# Patient Record
Sex: Female | Born: 1940
Health system: Southern US, Community
[De-identification: ages and names within clinical notes are randomized; demographics above are authoritative.]

## PROBLEM LIST (undated history)

## (undated) DIAGNOSIS — K222 Esophageal obstruction: Secondary | ICD-10-CM

## (undated) DIAGNOSIS — K589 Irritable bowel syndrome without diarrhea: Secondary | ICD-10-CM

## (undated) DIAGNOSIS — K5730 Diverticulosis of large intestine without perforation or abscess without bleeding: Secondary | ICD-10-CM

## (undated) DIAGNOSIS — J45909 Unspecified asthma, uncomplicated: Secondary | ICD-10-CM

## (undated) DIAGNOSIS — E7800 Pure hypercholesterolemia, unspecified: Secondary | ICD-10-CM

## (undated) DIAGNOSIS — G2581 Restless legs syndrome: Secondary | ICD-10-CM

## (undated) DIAGNOSIS — R490 Dysphonia: Secondary | ICD-10-CM

## (undated) DIAGNOSIS — K3184 Gastroparesis: Secondary | ICD-10-CM

## (undated) DIAGNOSIS — N3000 Acute cystitis without hematuria: Secondary | ICD-10-CM

## (undated) DIAGNOSIS — R0789 Other chest pain: Secondary | ICD-10-CM

## (undated) DIAGNOSIS — R4701 Aphasia: Secondary | ICD-10-CM

## (undated) DIAGNOSIS — M1990 Unspecified osteoarthritis, unspecified site: Secondary | ICD-10-CM

## (undated) DIAGNOSIS — K219 Gastro-esophageal reflux disease without esophagitis: Secondary | ICD-10-CM

## (undated) DIAGNOSIS — R413 Other amnesia: Secondary | ICD-10-CM

## (undated) DIAGNOSIS — F419 Anxiety disorder, unspecified: Secondary | ICD-10-CM

## (undated) DIAGNOSIS — R3129 Other microscopic hematuria: Secondary | ICD-10-CM

## (undated) DIAGNOSIS — M797 Fibromyalgia: Secondary | ICD-10-CM

## (undated) DIAGNOSIS — I679 Cerebrovascular disease, unspecified: Secondary | ICD-10-CM

## (undated) DIAGNOSIS — G459 Transient cerebral ischemic attack, unspecified: Secondary | ICD-10-CM

## (undated) HISTORY — DX: Gastroparesis: K31.84

## (undated) HISTORY — DX: Diverticulosis of large intestine without perforation or abscess without bleeding: K57.30

## (undated) HISTORY — DX: Cerebrovascular disease, unspecified: I67.9

## (undated) HISTORY — DX: Other microscopic hematuria: R31.29

## (undated) HISTORY — DX: Fibromyalgia: M79.7

## (undated) HISTORY — DX: Other amnesia: R41.3

## (undated) HISTORY — DX: Unspecified osteoarthritis, unspecified site: M19.90

## (undated) HISTORY — DX: Irritable bowel syndrome, unspecified: K58.9

## (undated) HISTORY — DX: Restless legs syndrome: G25.81

## (undated) HISTORY — DX: Dysphonia: R49.0

## (undated) HISTORY — DX: Other chest pain: R07.89

## (undated) HISTORY — DX: Acute cystitis without hematuria: N30.00

## (undated) HISTORY — DX: Aphasia: R47.01

## (undated) HISTORY — PX: OTHER SURGICAL HISTORY: SHX169

## (undated) HISTORY — DX: Unspecified asthma, uncomplicated: J45.909

## (undated) HISTORY — DX: Anxiety disorder, unspecified: F41.9

## (undated) HISTORY — DX: Gastro-esophageal reflux disease without esophagitis: K21.9

## (undated) HISTORY — DX: Pure hypercholesterolemia, unspecified: E78.00

## (undated) HISTORY — DX: Esophageal obstruction: K22.2

---

## 1993-02-24 HISTORY — PX: ABDOMINAL HYSTERECTOMY: SHX81

## 1997-09-29 ENCOUNTER — Ambulatory Visit (HOSPITAL_COMMUNITY): Admission: RE | Admit: 1997-09-29 | Discharge: 1997-09-29 | Payer: Self-pay | Admitting: Gastroenterology

## 1998-09-12 ENCOUNTER — Other Ambulatory Visit: Admission: RE | Admit: 1998-09-12 | Discharge: 1998-09-12 | Payer: Self-pay | Admitting: Obstetrics and Gynecology

## 1999-01-08 ENCOUNTER — Ambulatory Visit (HOSPITAL_COMMUNITY): Admission: RE | Admit: 1999-01-08 | Discharge: 1999-01-08 | Payer: Self-pay | Admitting: Surgery

## 1999-04-05 ENCOUNTER — Other Ambulatory Visit: Admission: RE | Admit: 1999-04-05 | Discharge: 1999-04-05 | Payer: Self-pay | Admitting: Gastroenterology

## 1999-04-05 ENCOUNTER — Encounter (INDEPENDENT_AMBULATORY_CARE_PROVIDER_SITE_OTHER): Payer: Self-pay | Admitting: Specialist

## 1999-04-11 ENCOUNTER — Ambulatory Visit (HOSPITAL_COMMUNITY): Admission: RE | Admit: 1999-04-11 | Discharge: 1999-04-11 | Payer: Self-pay | Admitting: Gastroenterology

## 1999-04-11 ENCOUNTER — Encounter: Payer: Self-pay | Admitting: Gastroenterology

## 1999-04-19 ENCOUNTER — Ambulatory Visit (HOSPITAL_COMMUNITY): Admission: RE | Admit: 1999-04-19 | Discharge: 1999-04-19 | Payer: Self-pay | Admitting: Gastroenterology

## 1999-04-19 ENCOUNTER — Encounter: Payer: Self-pay | Admitting: Gastroenterology

## 2000-07-09 ENCOUNTER — Other Ambulatory Visit: Admission: RE | Admit: 2000-07-09 | Discharge: 2000-07-09 | Payer: Self-pay | Admitting: Obstetrics and Gynecology

## 2000-09-03 ENCOUNTER — Other Ambulatory Visit: Admission: RE | Admit: 2000-09-03 | Discharge: 2000-09-03 | Payer: Self-pay | Admitting: Urology

## 2000-09-22 ENCOUNTER — Encounter: Admission: RE | Admit: 2000-09-22 | Discharge: 2000-09-22 | Payer: Self-pay | Admitting: Urology

## 2000-09-22 ENCOUNTER — Encounter: Payer: Self-pay | Admitting: Urology

## 2001-08-20 ENCOUNTER — Other Ambulatory Visit: Admission: RE | Admit: 2001-08-20 | Discharge: 2001-08-20 | Payer: Self-pay | Admitting: Obstetrics and Gynecology

## 2002-08-22 ENCOUNTER — Other Ambulatory Visit: Admission: RE | Admit: 2002-08-22 | Discharge: 2002-08-22 | Payer: Self-pay | Admitting: Obstetrics and Gynecology

## 2004-03-05 ENCOUNTER — Ambulatory Visit: Payer: Self-pay | Admitting: Pulmonary Disease

## 2004-07-21 ENCOUNTER — Emergency Department (HOSPITAL_COMMUNITY): Admission: AD | Admit: 2004-07-21 | Discharge: 2004-07-21 | Payer: Self-pay | Admitting: Family Medicine

## 2004-07-23 ENCOUNTER — Ambulatory Visit: Payer: Self-pay | Admitting: Pulmonary Disease

## 2004-07-25 HISTORY — PX: OTHER SURGICAL HISTORY: SHX169

## 2004-07-27 ENCOUNTER — Ambulatory Visit (HOSPITAL_COMMUNITY): Admission: RE | Admit: 2004-07-27 | Discharge: 2004-07-27 | Payer: Self-pay | Admitting: Pulmonary Disease

## 2004-08-23 ENCOUNTER — Ambulatory Visit (HOSPITAL_COMMUNITY): Admission: AD | Admit: 2004-08-23 | Discharge: 2004-08-25 | Payer: Self-pay | Admitting: Orthopedic Surgery

## 2004-11-08 ENCOUNTER — Ambulatory Visit: Payer: Self-pay | Admitting: Gastroenterology

## 2004-11-21 ENCOUNTER — Ambulatory Visit: Payer: Self-pay | Admitting: Gastroenterology

## 2005-02-05 ENCOUNTER — Ambulatory Visit: Payer: Self-pay | Admitting: Pulmonary Disease

## 2005-09-02 ENCOUNTER — Ambulatory Visit: Payer: Self-pay | Admitting: Gastroenterology

## 2005-09-03 ENCOUNTER — Ambulatory Visit: Payer: Self-pay | Admitting: Gastroenterology

## 2005-09-03 ENCOUNTER — Encounter: Payer: Self-pay | Admitting: Gastroenterology

## 2005-11-24 ENCOUNTER — Ambulatory Visit: Payer: Self-pay | Admitting: Pulmonary Disease

## 2006-03-02 ENCOUNTER — Ambulatory Visit: Payer: Self-pay | Admitting: Gastroenterology

## 2006-06-24 ENCOUNTER — Ambulatory Visit (HOSPITAL_BASED_OUTPATIENT_CLINIC_OR_DEPARTMENT_OTHER): Admission: RE | Admit: 2006-06-24 | Discharge: 2006-06-24 | Payer: Self-pay | Admitting: Rheumatology

## 2006-06-28 ENCOUNTER — Ambulatory Visit: Payer: Self-pay | Admitting: Internal Medicine

## 2006-12-07 ENCOUNTER — Ambulatory Visit: Payer: Self-pay | Admitting: Gastroenterology

## 2006-12-11 ENCOUNTER — Ambulatory Visit (HOSPITAL_COMMUNITY): Admission: RE | Admit: 2006-12-11 | Discharge: 2006-12-11 | Payer: Self-pay | Admitting: Gastroenterology

## 2007-01-05 ENCOUNTER — Ambulatory Visit: Payer: Self-pay | Admitting: Gastroenterology

## 2007-01-08 ENCOUNTER — Ambulatory Visit: Payer: Self-pay | Admitting: Pulmonary Disease

## 2007-01-08 LAB — CONVERTED CEMR LAB
ALT: 15 units/L (ref 0–35)
AST: 23 units/L (ref 0–37)
Albumin: 4 g/dL (ref 3.5–5.2)
Alkaline Phosphatase: 79 units/L (ref 39–117)
BUN: 11 mg/dL (ref 6–23)
Basophils Relative: 0.9 % (ref 0.0–1.0)
Bilirubin, Direct: 0.2 mg/dL (ref 0.0–0.3)
CO2: 31 meq/L (ref 19–32)
Calcium: 9.4 mg/dL (ref 8.4–10.5)
Chloride: 104 meq/L (ref 96–112)
Cholesterol: 212 mg/dL (ref 0–200)
Creatinine, Ser: 0.9 mg/dL (ref 0.4–1.2)
Direct LDL: 131.6 mg/dL
Eosinophils Relative: 4.6 % (ref 0.0–5.0)
GFR calc Af Amer: 80 mL/min
GFR calc non Af Amer: 66 mL/min
Glucose, Bld: 96 mg/dL (ref 70–99)
HCT: 39.7 % (ref 36.0–46.0)
HDL: 67.5 mg/dL (ref >39.0)
Hemoglobin: 13.9 g/dL (ref 12.0–15.0)
Lymphocytes Relative: 35.5 % (ref 12.0–46.0)
MCHC: 35.1 g/dL (ref 30.0–36.0)
MCV: 90.2 fL (ref 78.0–100.0)
Monocytes Relative: 9.8 % (ref 3.0–11.0)
Neutrophils Relative %: 49.2 % (ref 43.0–77.0)
Platelets: 181 10*3/uL (ref 150–400)
Potassium: 4.4 meq/L (ref 3.5–5.1)
RBC: 4.4 M/uL (ref 3.87–5.11)
RDW: 12.2 % (ref 11.5–14.6)
Sed Rate: 12 mm/hr (ref 0–25)
Sodium: 139 meq/L (ref 135–145)
TSH: 1.36 microintl units/mL (ref 0.35–5.50)
Total Bilirubin: 0.7 mg/dL (ref 0.3–1.2)
Total CHOL/HDL Ratio: 3.1
Total Protein: 7 g/dL (ref 6.0–8.3)
Triglycerides: 57 mg/dL (ref 0–149)
VLDL: 11 mg/dL (ref 0–40)
WBC: 3.8 10*3/uL — ABNORMAL LOW (ref 4.5–10.5)

## 2007-01-13 ENCOUNTER — Ambulatory Visit: Payer: Self-pay

## 2007-01-22 ENCOUNTER — Telehealth: Payer: Self-pay | Admitting: Pulmonary Disease

## 2007-01-27 ENCOUNTER — Telehealth (INDEPENDENT_AMBULATORY_CARE_PROVIDER_SITE_OTHER): Payer: Self-pay | Admitting: *Deleted

## 2007-01-29 ENCOUNTER — Telehealth: Payer: Self-pay | Admitting: Pulmonary Disease

## 2007-02-02 ENCOUNTER — Telehealth: Payer: Self-pay | Admitting: Pulmonary Disease

## 2007-02-04 ENCOUNTER — Emergency Department (HOSPITAL_COMMUNITY): Admission: EM | Admit: 2007-02-04 | Discharge: 2007-02-05 | Payer: Self-pay | Admitting: Emergency Medicine

## 2007-03-31 ENCOUNTER — Encounter: Admission: RE | Admit: 2007-03-31 | Discharge: 2007-03-31 | Payer: Self-pay | Admitting: Neurology

## 2007-05-18 ENCOUNTER — Encounter: Payer: Self-pay | Admitting: Pulmonary Disease

## 2007-07-16 ENCOUNTER — Telehealth: Payer: Self-pay | Admitting: Gastroenterology

## 2007-07-29 ENCOUNTER — Telehealth: Payer: Self-pay | Admitting: Gastroenterology

## 2007-07-30 DIAGNOSIS — F411 Generalized anxiety disorder: Secondary | ICD-10-CM | POA: Insufficient documentation

## 2007-07-30 DIAGNOSIS — K649 Unspecified hemorrhoids: Secondary | ICD-10-CM | POA: Insufficient documentation

## 2007-07-30 DIAGNOSIS — K222 Esophageal obstruction: Secondary | ICD-10-CM | POA: Insufficient documentation

## 2007-08-02 ENCOUNTER — Ambulatory Visit: Payer: Self-pay | Admitting: Gastroenterology

## 2007-08-02 DIAGNOSIS — K3184 Gastroparesis: Secondary | ICD-10-CM | POA: Insufficient documentation

## 2007-08-02 DIAGNOSIS — K219 Gastro-esophageal reflux disease without esophagitis: Secondary | ICD-10-CM | POA: Insufficient documentation

## 2007-10-25 ENCOUNTER — Telehealth: Payer: Self-pay | Admitting: Gastroenterology

## 2008-03-16 ENCOUNTER — Telehealth: Payer: Self-pay | Admitting: Pulmonary Disease

## 2008-03-22 ENCOUNTER — Ambulatory Visit: Payer: Self-pay | Admitting: Pulmonary Disease

## 2008-03-22 DIAGNOSIS — N3000 Acute cystitis without hematuria: Secondary | ICD-10-CM | POA: Insufficient documentation

## 2008-03-27 ENCOUNTER — Telehealth (INDEPENDENT_AMBULATORY_CARE_PROVIDER_SITE_OTHER): Payer: Self-pay | Admitting: *Deleted

## 2008-03-28 ENCOUNTER — Ambulatory Visit: Payer: Self-pay | Admitting: Pulmonary Disease

## 2008-03-28 DIAGNOSIS — IMO0001 Reserved for inherently not codable concepts without codable children: Secondary | ICD-10-CM | POA: Insufficient documentation

## 2008-03-28 DIAGNOSIS — E7800 Pure hypercholesterolemia, unspecified: Secondary | ICD-10-CM | POA: Insufficient documentation

## 2008-03-28 DIAGNOSIS — M797 Fibromyalgia: Secondary | ICD-10-CM | POA: Insufficient documentation

## 2008-03-28 DIAGNOSIS — R32 Unspecified urinary incontinence: Secondary | ICD-10-CM | POA: Insufficient documentation

## 2008-03-28 DIAGNOSIS — E559 Vitamin D deficiency, unspecified: Secondary | ICD-10-CM | POA: Insufficient documentation

## 2008-03-30 ENCOUNTER — Telehealth (INDEPENDENT_AMBULATORY_CARE_PROVIDER_SITE_OTHER): Payer: Self-pay | Admitting: *Deleted

## 2008-04-02 DIAGNOSIS — K589 Irritable bowel syndrome without diarrhea: Secondary | ICD-10-CM | POA: Insufficient documentation

## 2008-04-02 DIAGNOSIS — M1990 Unspecified osteoarthritis, unspecified site: Secondary | ICD-10-CM | POA: Insufficient documentation

## 2008-04-02 DIAGNOSIS — J209 Acute bronchitis, unspecified: Secondary | ICD-10-CM | POA: Insufficient documentation

## 2008-04-02 DIAGNOSIS — Z87448 Personal history of other diseases of urinary system: Secondary | ICD-10-CM | POA: Insufficient documentation

## 2008-04-02 DIAGNOSIS — K5730 Diverticulosis of large intestine without perforation or abscess without bleeding: Secondary | ICD-10-CM | POA: Insufficient documentation

## 2008-04-02 LAB — CONVERTED CEMR LAB
ALT: 25 units/L (ref 0–35)
AST: 27 units/L (ref 0–37)
Albumin: 3.7 g/dL (ref 3.5–5.2)
Alkaline Phosphatase: 90 units/L (ref 39–117)
BUN: 12 mg/dL (ref 6–23)
Basophils Absolute: 0.2 10*3/uL — ABNORMAL HIGH (ref 0.0–0.1)
Basophils Relative: 5.4 % — ABNORMAL HIGH (ref 0.0–3.0)
Bilirubin Urine: NEGATIVE
Bilirubin, Direct: 0.1 mg/dL (ref 0.0–0.3)
CO2: 34 meq/L — ABNORMAL HIGH (ref 19–32)
Calcium: 9.2 mg/dL (ref 8.4–10.5)
Chloride: 103 meq/L (ref 96–112)
Cholesterol: 207 mg/dL (ref 0–200)
Creatinine, Ser: 0.7 mg/dL (ref 0.4–1.2)
Crystals: NEGATIVE
Direct LDL: 126.5 mg/dL
Eosinophils Absolute: 0.1 10*3/uL (ref 0.0–0.7)
Eosinophils Relative: 2.7 % (ref 0.0–5.0)
GFR calc Af Amer: 107 mL/min
GFR calc non Af Amer: 88 mL/min
Glucose, Bld: 87 mg/dL (ref 70–99)
HCT: 39.3 % (ref 36.0–46.0)
HDL: 63.9 mg/dL (ref >39.0)
Hemoglobin: 13.5 g/dL (ref 12.0–15.0)
Ketones, ur: NEGATIVE mg/dL
Lymphocytes Relative: 35.3 % (ref 12.0–46.0)
MCHC: 34.4 g/dL (ref 30.0–36.0)
MCV: 90.7 fL (ref 78.0–100.0)
Monocytes Absolute: 0.4 10*3/uL (ref 0.1–1.0)
Monocytes Relative: 8.1 % (ref 3.0–12.0)
Mucus, UA: NEGATIVE
Neutro Abs: 2.1 10*3/uL (ref 1.4–7.7)
Neutrophils Relative %: 48.5 % (ref 43.0–77.0)
Nitrite: NEGATIVE
Platelets: 149 10*3/uL — ABNORMAL LOW (ref 150–400)
Potassium: 4.3 meq/L (ref 3.5–5.1)
RBC: 4.33 M/uL (ref 3.87–5.11)
RDW: 12.2 % (ref 11.5–14.6)
Sed Rate: 15 mm/hr (ref 0–22)
Sodium: 141 meq/L (ref 135–145)
Specific Gravity, Urine: 1.015 (ref 1.000–1.03)
TSH: 1.5 microintl units/mL (ref 0.35–5.50)
Total Bilirubin: 1 mg/dL (ref 0.3–1.2)
Total CHOL/HDL Ratio: 3.2
Total Protein, Urine: NEGATIVE mg/dL
Total Protein: 7.3 g/dL (ref 6.0–8.3)
Triglycerides: 62 mg/dL (ref 0–149)
Urine Glucose: NEGATIVE mg/dL
Urobilinogen, UA: 0.2 (ref 0.0–1.0)
VLDL: 12 mg/dL (ref 0–40)
Vit D, 1,25-Dihydroxy: 15 — ABNORMAL LOW (ref 30–89)
WBC: 4.4 10*3/uL — ABNORMAL LOW (ref 4.5–10.5)
pH: 7.5 (ref 5.0–8.0)

## 2008-04-03 DIAGNOSIS — R498 Other voice and resonance disorders: Secondary | ICD-10-CM | POA: Insufficient documentation

## 2008-04-03 DIAGNOSIS — I679 Cerebrovascular disease, unspecified: Secondary | ICD-10-CM | POA: Insufficient documentation

## 2008-04-03 DIAGNOSIS — R0789 Other chest pain: Secondary | ICD-10-CM | POA: Insufficient documentation

## 2008-04-07 ENCOUNTER — Telehealth (INDEPENDENT_AMBULATORY_CARE_PROVIDER_SITE_OTHER): Payer: Self-pay | Admitting: *Deleted

## 2008-04-18 ENCOUNTER — Telehealth: Payer: Self-pay | Admitting: Gastroenterology

## 2008-04-19 ENCOUNTER — Encounter: Payer: Self-pay | Admitting: Gastroenterology

## 2008-08-15 ENCOUNTER — Other Ambulatory Visit: Admission: RE | Admit: 2008-08-15 | Discharge: 2008-08-15 | Payer: Self-pay | Admitting: Radiology

## 2008-08-15 ENCOUNTER — Encounter: Payer: Self-pay | Admitting: Pulmonary Disease

## 2008-11-07 ENCOUNTER — Encounter: Payer: Self-pay | Admitting: Pulmonary Disease

## 2008-12-28 ENCOUNTER — Encounter: Payer: Self-pay | Admitting: Pulmonary Disease

## 2009-01-05 ENCOUNTER — Encounter: Payer: Self-pay | Admitting: Pulmonary Disease

## 2009-02-02 ENCOUNTER — Encounter: Payer: Self-pay | Admitting: Pulmonary Disease

## 2009-03-23 ENCOUNTER — Telehealth (INDEPENDENT_AMBULATORY_CARE_PROVIDER_SITE_OTHER): Payer: Self-pay | Admitting: *Deleted

## 2009-03-23 ENCOUNTER — Ambulatory Visit: Payer: Self-pay | Admitting: Pulmonary Disease

## 2009-03-23 DIAGNOSIS — L259 Unspecified contact dermatitis, unspecified cause: Secondary | ICD-10-CM | POA: Insufficient documentation

## 2009-03-26 ENCOUNTER — Telehealth: Payer: Self-pay | Admitting: Adult Health

## 2009-04-27 ENCOUNTER — Encounter: Payer: Self-pay | Admitting: Pulmonary Disease

## 2009-04-30 ENCOUNTER — Encounter: Payer: Self-pay | Admitting: Pulmonary Disease

## 2009-05-23 ENCOUNTER — Ambulatory Visit: Payer: Self-pay | Admitting: Pulmonary Disease

## 2009-05-27 LAB — CONVERTED CEMR LAB
ALT: 14 units/L (ref 0–35)
AST: 21 units/L (ref 0–37)
Albumin: 3.9 g/dL (ref 3.5–5.2)
Alkaline Phosphatase: 76 units/L (ref 39–117)
BUN: 11 mg/dL (ref 6–23)
Basophils Absolute: 0 10*3/uL (ref 0.0–0.1)
Basophils Relative: 0.3 % (ref 0.0–3.0)
Bilirubin, Direct: 0.1 mg/dL (ref 0.0–0.3)
CO2: 32 meq/L (ref 19–32)
Calcium: 9.3 mg/dL (ref 8.4–10.5)
Chloride: 105 meq/L (ref 96–112)
Cholesterol: 238 mg/dL — ABNORMAL HIGH (ref 0–200)
Creatinine, Ser: 1.1 mg/dL (ref 0.4–1.2)
Direct LDL: 153.7 mg/dL
Eosinophils Absolute: 0 10*3/uL (ref 0.0–0.7)
Eosinophils Relative: 0.8 % (ref 0.0–5.0)
GFR calc non Af Amer: 63.15 mL/min (ref >60)
Glucose, Bld: 88 mg/dL (ref 70–99)
HCT: 41 % (ref 36.0–46.0)
HDL: 70.5 mg/dL (ref >39.00)
Hemoglobin: 13.4 g/dL (ref 12.0–15.0)
Lymphocytes Relative: 28.9 % (ref 12.0–46.0)
Lymphs Abs: 1.5 10*3/uL (ref 0.7–4.0)
MCHC: 32.8 g/dL (ref 30.0–36.0)
MCV: 92.3 fL (ref 78.0–100.0)
Monocytes Absolute: 0.4 10*3/uL (ref 0.1–1.0)
Monocytes Relative: 6.9 % (ref 3.0–12.0)
Neutro Abs: 3.4 10*3/uL (ref 1.4–7.7)
Neutrophils Relative %: 63.1 % (ref 43.0–77.0)
Platelets: 164 10*3/uL (ref 150.0–400.0)
Potassium: 4.4 meq/L (ref 3.5–5.1)
RBC: 4.44 M/uL (ref 3.87–5.11)
RDW: 12.2 % (ref 11.5–14.6)
Sodium: 143 meq/L (ref 135–145)
TSH: 2.05 microintl units/mL (ref 0.35–5.50)
Total Bilirubin: 0.7 mg/dL (ref 0.3–1.2)
Total CHOL/HDL Ratio: 3
Total Protein: 7.2 g/dL (ref 6.0–8.3)
Triglycerides: 61 mg/dL (ref 0.0–149.0)
VLDL: 12.2 mg/dL (ref 0.0–40.0)
Vit D, 25-Hydroxy: 39 ng/mL (ref 30–89)
WBC: 5.3 10*3/uL (ref 4.5–10.5)

## 2009-06-01 ENCOUNTER — Telehealth (INDEPENDENT_AMBULATORY_CARE_PROVIDER_SITE_OTHER): Payer: Self-pay | Admitting: *Deleted

## 2009-06-05 ENCOUNTER — Encounter: Payer: Self-pay | Admitting: Pulmonary Disease

## 2009-07-10 ENCOUNTER — Encounter: Payer: Self-pay | Admitting: Pulmonary Disease

## 2009-07-19 ENCOUNTER — Encounter: Payer: Self-pay | Admitting: Pulmonary Disease

## 2009-08-02 ENCOUNTER — Encounter: Payer: Self-pay | Admitting: Pulmonary Disease

## 2009-08-31 ENCOUNTER — Telehealth (INDEPENDENT_AMBULATORY_CARE_PROVIDER_SITE_OTHER): Payer: Self-pay | Admitting: *Deleted

## 2009-09-04 ENCOUNTER — Telehealth (INDEPENDENT_AMBULATORY_CARE_PROVIDER_SITE_OTHER): Payer: Self-pay | Admitting: *Deleted

## 2009-09-17 ENCOUNTER — Encounter: Payer: Self-pay | Admitting: Pulmonary Disease

## 2009-09-20 ENCOUNTER — Telehealth (INDEPENDENT_AMBULATORY_CARE_PROVIDER_SITE_OTHER): Payer: Self-pay | Admitting: *Deleted

## 2009-09-21 ENCOUNTER — Ambulatory Visit: Payer: Self-pay | Admitting: Adult Health

## 2009-09-21 ENCOUNTER — Ambulatory Visit: Payer: Self-pay | Admitting: Pulmonary Disease

## 2009-09-21 LAB — CONVERTED CEMR LAB
Bilirubin Urine: NEGATIVE
Ketones, ur: NEGATIVE mg/dL
Leukocytes, UA: NEGATIVE
Nitrite: NEGATIVE
Specific Gravity, Urine: 1.01 (ref 1.000–1.030)
Total Protein, Urine: NEGATIVE mg/dL
Urine Glucose: NEGATIVE mg/dL
Urobilinogen, UA: 0.2 (ref 0.0–1.0)
pH: 5.5 (ref 5.0–8.0)

## 2009-09-25 DIAGNOSIS — R39198 Other difficulties with micturition: Secondary | ICD-10-CM | POA: Insufficient documentation

## 2009-09-25 DIAGNOSIS — R3919 Other difficulties with micturition: Secondary | ICD-10-CM | POA: Insufficient documentation

## 2009-09-26 ENCOUNTER — Encounter: Admission: RE | Admit: 2009-09-26 | Discharge: 2009-09-26 | Payer: Self-pay | Admitting: Neurology

## 2009-09-27 ENCOUNTER — Telehealth (INDEPENDENT_AMBULATORY_CARE_PROVIDER_SITE_OTHER): Payer: Self-pay | Admitting: *Deleted

## 2009-09-28 ENCOUNTER — Encounter: Payer: Self-pay | Admitting: Pulmonary Disease

## 2009-10-08 ENCOUNTER — Encounter: Payer: Self-pay | Admitting: Pulmonary Disease

## 2009-10-08 ENCOUNTER — Emergency Department (HOSPITAL_COMMUNITY): Admission: EM | Admit: 2009-10-08 | Discharge: 2009-10-08 | Payer: Self-pay | Admitting: Emergency Medicine

## 2009-10-09 ENCOUNTER — Telehealth (INDEPENDENT_AMBULATORY_CARE_PROVIDER_SITE_OTHER): Payer: Self-pay | Admitting: *Deleted

## 2009-10-10 ENCOUNTER — Telehealth (INDEPENDENT_AMBULATORY_CARE_PROVIDER_SITE_OTHER): Payer: Self-pay | Admitting: *Deleted

## 2009-12-10 ENCOUNTER — Telehealth: Payer: Self-pay | Admitting: Pulmonary Disease

## 2010-01-08 ENCOUNTER — Telehealth (INDEPENDENT_AMBULATORY_CARE_PROVIDER_SITE_OTHER): Payer: Self-pay | Admitting: *Deleted

## 2010-01-14 ENCOUNTER — Ambulatory Visit: Payer: Self-pay | Admitting: Pulmonary Disease

## 2010-01-15 ENCOUNTER — Ambulatory Visit: Payer: Self-pay | Admitting: Pulmonary Disease

## 2010-01-21 LAB — CONVERTED CEMR LAB
Direct LDL: 129.2 mg/dL
HDL: 66.4 mg/dL (ref >39.00)
Total CHOL/HDL Ratio: 3
Triglycerides: 54 mg/dL (ref 0.0–149.0)

## 2010-03-07 ENCOUNTER — Emergency Department (HOSPITAL_COMMUNITY)
Admission: EM | Admit: 2010-03-07 | Discharge: 2010-03-08 | Payer: Self-pay | Source: Home / Self Care | Admitting: Emergency Medicine

## 2010-03-08 ENCOUNTER — Encounter (INDEPENDENT_AMBULATORY_CARE_PROVIDER_SITE_OTHER): Payer: Self-pay | Admitting: Emergency Medicine

## 2010-03-11 ENCOUNTER — Telehealth (INDEPENDENT_AMBULATORY_CARE_PROVIDER_SITE_OTHER): Payer: Self-pay | Admitting: *Deleted

## 2010-03-11 LAB — CK TOTAL AND CKMB (NOT AT ARMC)
CK, MB: 1.3 ng/mL (ref 0.3–4.0)
Relative Index: 1.3 (ref 0.0–2.5)
Total CK: 103 U/L (ref 7–177)

## 2010-03-11 LAB — URINALYSIS, ROUTINE W REFLEX MICROSCOPIC
Bilirubin Urine: NEGATIVE
Ketones, ur: NEGATIVE mg/dL
Leukocytes, UA: NEGATIVE
Nitrite: NEGATIVE
Protein, ur: NEGATIVE mg/dL
Specific Gravity, Urine: 1.006 (ref 1.005–1.030)
Urine Glucose, Fasting: NEGATIVE mg/dL
Urobilinogen, UA: 0.2 mg/dL (ref 0.0–1.0)
pH: 6 (ref 5.0–8.0)

## 2010-03-11 LAB — TROPONIN I: Troponin I: 0.01 ng/mL (ref 0.00–0.06)

## 2010-03-11 LAB — DIFFERENTIAL
Basophils Absolute: 0 10*3/uL (ref 0.0–0.1)
Basophils Relative: 1 % (ref 0–1)
Eosinophils Absolute: 0.2 10*3/uL (ref 0.0–0.7)
Eosinophils Relative: 4 % (ref 0–5)
Lymphocytes Relative: 47 % — ABNORMAL HIGH (ref 12–46)
Lymphs Abs: 2.3 10*3/uL (ref 0.7–4.0)
Monocytes Absolute: 0.4 10*3/uL (ref 0.1–1.0)
Monocytes Relative: 7 % (ref 3–12)
Neutro Abs: 2 10*3/uL (ref 1.7–7.7)
Neutrophils Relative %: 42 % — ABNORMAL LOW (ref 43–77)

## 2010-03-11 LAB — BASIC METABOLIC PANEL
BUN: 9 mg/dL (ref 6–23)
CO2: 29 mEq/L (ref 19–32)
Calcium: 9 mg/dL (ref 8.4–10.5)
Chloride: 105 mEq/L (ref 96–112)
Creatinine, Ser: 0.94 mg/dL (ref 0.4–1.2)
GFR calc Af Amer: >60 mL/min (ref >60)
GFR calc non Af Amer: 59 mL/min — ABNORMAL LOW (ref >60)
Glucose, Bld: 96 mg/dL (ref 70–99)
Potassium: 3.8 mEq/L (ref 3.5–5.1)
Sodium: 140 mEq/L (ref 135–145)

## 2010-03-11 LAB — CBC
HCT: 38.2 % (ref 36.0–46.0)
Hemoglobin: 12.2 g/dL (ref 12.0–15.0)
MCH: 28.7 pg (ref 26.0–34.0)
MCHC: 31.9 g/dL (ref 30.0–36.0)
MCV: 89.9 fL (ref 78.0–100.0)
Platelets: 168 10*3/uL (ref 150–400)
RBC: 4.25 MIL/uL (ref 3.87–5.11)
RDW: 13 % (ref 11.5–15.5)
WBC: 4.8 10*3/uL (ref 4.0–10.5)

## 2010-03-11 LAB — LIPID PANEL
Cholesterol: 182 mg/dL (ref 0–200)
HDL: 55 mg/dL (ref >39)
LDL Cholesterol: 106 mg/dL — ABNORMAL HIGH (ref 0–99)
Total CHOL/HDL Ratio: 3.3 RATIO
Triglycerides: 107 mg/dL (ref <150)
VLDL: 21 mg/dL (ref 0–40)

## 2010-03-11 LAB — POCT CARDIAC MARKERS
CKMB, poc: <1 ng/mL — ABNORMAL LOW (ref 1.0–8.0)
Myoglobin, poc: 51.7 ng/mL (ref 12–200)
Troponin i, poc: <0.05 ng/mL (ref 0.00–0.09)

## 2010-03-11 LAB — HEMOGLOBIN A1C
Hgb A1c MFr Bld: 5.7 % — ABNORMAL HIGH (ref <5.7)
Mean Plasma Glucose: 117 mg/dL — ABNORMAL HIGH (ref <117)

## 2010-03-11 LAB — APTT: aPTT: 27 seconds (ref 24–37)

## 2010-03-11 LAB — URINE MICROSCOPIC-ADD ON

## 2010-03-11 LAB — PROTIME-INR
INR: 1.02 (ref 0.00–1.49)
Prothrombin Time: 13.6 seconds (ref 11.6–15.2)

## 2010-03-15 ENCOUNTER — Encounter: Payer: Self-pay | Admitting: Pulmonary Disease

## 2010-03-19 ENCOUNTER — Encounter: Payer: Self-pay | Admitting: Pulmonary Disease

## 2010-03-28 NOTE — Progress Notes (Signed)
Summary: rash  Phone Note Call from Patient   Caller: Patient Call For: nadel Summary of Call: pt had  a really bad rash last night so went to the er and they gave her a medicinie for this put her on ratidine and hydroxzine still has rash and red bumps red from wrist to bend of the arm really needs help  Initial call taken by: Lacinda Axon,  October 09, 2009 5:23 PM  Follow-up for Phone Call        called and spoke with pt.  pt states she went to urgent care last night d/t rash and red bumps on arm.  pt states this comes and goes.  offered pt an appt.  pt scheduled to see TP tomorrow at 11:30am.  Arman Filter LPN  October 09, 2009 5:28 PM

## 2010-03-28 NOTE — Progress Notes (Signed)
Summary: omeprazole PA  Phone Note Outgoing Call   Call placed by: Vernie Murders,  June 01, 2009 5:22 PM Call placed to: Insurer Summary of Call: PA for omeprazole 40 mg was initiated through [employer].  Form placed on SN's cart to be completed. Initial call taken by: Vernie Murders,  June 01, 2009 5:23 PM  Follow-up for Phone Call        pt says that her generic Zegrid is Omeprazole Sodium Bicarbonate - Said she picked up wrong stuff yesterday and wants you to ask pharm if they will give her a credit.  This is what Dr. Lovie Macadamia had her on. Follow-up by: Eugene Gavia,  June 05, 2009 10:52 AM  Additional Follow-up for Phone Call Additional follow up Details #1::        the omeprazole has been approved by the pharmacy--see the prior auth that has been scanned in Randell Loop [facility]  June 07, 2009 4:14 PM

## 2010-03-28 NOTE — Progress Notes (Signed)
Summary: SET UP LABS  Phone Note Call from Patient Call back at Home Phone [phone]   Caller: Patient Call For: NADEL Summary of Call: PT HAS APPT NEXT WK. WANTS LABS SET UP SO SHE CAN COME IN THIS WK. CALL HOME # FIRST OR CELL [phone] Initial call taken by: Tivis Ringer, CNA,  January 08, 2010 12:31 PM  Follow-up for Phone Call        Please advise what lab orders to put in computer. Abigail Miyamoto RN  January 08, 2010 1:05 PM    per SN---only needs lip-272.0.  thanks Randell Loop CMA  January 08, 2010 2:24 PM   Additional Follow-up for Phone Call Additional follow up Details #1::        Spoke with pt and made aware order was placed and needs to come in fasting. I added vitamin d level per her request- she is due to have this rechecked. Additional Follow-up by: Vernie Murders,  January 08, 2010 2:45 PM

## 2010-03-28 NOTE — Progress Notes (Signed)
Summary: referral- info given to pt  Phone Note Call from Patient Call back at [facility] Phone [phone]   Caller: Patient Call For: nadel Summary of Call: pt called back re: dr Jodelle Green recs (see emr msg 7/8). i gave her the info (also gave her a ph# for dr Anne Hahn). nothing further needed per pt.  Initial call taken by: Tivis Ringer, CNA,  September 04, 2009 4:08 PM

## 2010-03-28 NOTE — Progress Notes (Signed)
Summary: referral  Phone Note Call from Patient Call back at Home Phone [phone]   Caller: Patient Call For: Paige Pena Reason for Call: Talk to Nurse Summary of Call: pt says she still sees something in her urine whe she urinates.  Can she be referred to Urology? Initial call taken by: Eugene Gavia,  September 27, 2009 3:19 PM  Follow-up for Phone Call        Per Tammy Parrett pt instructed that she could call her urologist and schedule  an appt if pt feels this is necessary and we can fax results of labs if they need them. Abigail Miyamoto RN  September 27, 2009 3:28 PM

## 2010-03-28 NOTE — Letter (Signed)
Summary: [facility]  [facility]   Imported By: Sherian Rein 09/26/2009 08:35:15  _____________________________________________________________________  External Attachment:    Type:   Image     Comment:   External Document

## 2010-03-28 NOTE — Progress Notes (Signed)
Summary: foreign body in urine  Phone Note Call from Patient Call back at Home Phone [phone] Call back at [phone]   Caller: Patient Call For: nadel Summary of Call: found a foreign body in urine Initial call taken by: Lacinda Axon,  September 20, 2009 12:16 PM  Follow-up for Phone Call        ATC pt at home #[facility], called the other # given and NA, no option to leave msg. Will await a call back. Follow-up by: Vernie Murders,  September 20, 2009 1:11 PM  Additional Follow-up for Phone Call Additional follow up Details #1::        Spoke with pt.  She states that she found "parasite" in the toilet after she urinated today.  She states that "looked like a tiny fish".  She denies any blood in urine, painful urination, fever or back pain. She states that she has only noticed some pain "where ovaries are".  She does have a urologist and so I advised that she call them.  I advised that I will forward msg to TP and call her back if TP has any further recs.  Pt verbalized understanding. Additional Follow-up by: Vernie Murders,  September 20, 2009 1:22 PM    Additional Follow-up for Phone Call Additional follow up Details #2::    would contact urology- I am not familar with this.  if she can not get in , will see in ov w/ ua/micro/cx tomorrow. Follow-up by: Rubye Oaks NP,  September 20, 2009 2:21 PM  Additional Follow-up for Phone Call Additional follow up Details #3:: Details for Additional Follow-up Action Taken: Spoke with pt and again, advised needs to see her urologist and if can not get in to see them, can come in for ov with ua/micro/cx tommorrow.  Pt still awaiting a call back from urology, wlll call for ov if needed . Additional Follow-up by: Vernie Murders,  September 20, 2009 2:28 PM

## 2010-03-28 NOTE — Letter (Signed)
Summary: [facility]   Imported By: Sherian Rein 08/13/2009 14:00:39  _____________________________________________________________________  External Attachment:    Type:   Image     Comment:   External Document

## 2010-03-28 NOTE — Letter (Signed)
Summary: [facility]   Imported By: Sherian Rein 08/13/2009 13:59:05  _____________________________________________________________________  External Attachment:    Type:   Image     Comment:   External Document

## 2010-03-28 NOTE — Assessment & Plan Note (Signed)
Summary: ? parasite in urine/see message in emr from 09/20/2009/apc   Primary Provider/Referring Provider:  Alroy Dust, MD  CC:  pt saw foreing body in urine yesterday and was unable to see urology.  History of Present Illness: 70 y/o BF here for a follow up visit... she has multiple medical problems as noted below...     ~  March 23, 2008:  pt last seen Nov08 for CPX- she had mult somatic complaints and was very anxious wanting a repeat cardiac eval w/ Myoview stress test, and cerebrovasc eval to rule out stroke... ** NuclearStressTest 01/13/07 was negative- no ischemia, no infarction, EF= 79%... ** Neuro appt set up but pt didn't follow up & apparently wasn't seen (prev hx of abn MRI in 2006- see below)... she is followed regularly by DrDeveshwar for Rheum due to her FM & she has tried her on Provigil for Idiopathic Hypersomnolence... she has had a major problem w/ her GI track w/ GERD, some dysphagia, and gastroparesis on gastric empying scan 10/08 (75% activity remained at [facility])... intolerant to Reglan, maintained on the Zegerid, Carafate, recent trial of Domperidone... she is frustrated and has set up a second opinion consult at [facility].  March 23, 2009 --Presents for an acute office visit. Complains of rash with redness, bumps, itching and a tingling sensation all over x6days.  states she noticed it after eating out on Saturday that began on the inner thighs and moved upward.  states she took benadryl on Sundlay and Monday which seemed to help, but the rash returned.. No difficulty swallowing, no dyspnea or wheeizng Denies chest pain, dyspnea, orthopnea, hemoptysis, fever, n/v/d, edema, headache,recent travel or antibiotics.  September 21, 2009 --Presents for work in visit. pt saw foreing body in urine yesterday and was unable to see urology. Pt noticed that when she came in from working in yard , after she urinated in toilet, there was an insect w/ wings in toilet. She saw this on one other  occasion 1 month ago. She is afraid this is some kind of parasite or was in her bladder. She denies any recent travel. dysuria, hematuria, abdominal pain, n/v/d. Denies chest pain, dyspnea, orthopnea, hemoptysis, fever, n/v/d, edema, headache,recent travel or antibiotics.      Preventive Screening-Counseling & Management  Alcohol-Tobacco     Smoking Status: never  Caffeine-Diet-Exercise     Does Patient Exercise: no  Allergies: 1)  ! Sulfa 2)  ! Reglan 3)  ! Morphine 4)  Penicillin  Comments:  Nurse/Medical Assistant: The patient's medications and allergies were reviewed with the patient and were updated in the Medication and Allergy Lists.  Past History:  Past Medical History: Last updated: 05/23/2009  Hx of DYSPHONIA (ICD-784.49) R/O SLEEP APNEA (ICD-780.57) Hx of ASTHMATIC BRONCHITIS, ACUTE (ICD-466.0) Hx of CHEST PAIN, ATYPICAL (ICD-786.59) CEREBROVASCULAR DISEASE (ICD-437.9) HYPERCHOLESTEROLEMIA (ICD-272.0) GERD (ICD-530.81) ACUTE CYSTITIS (ICD-595.0) ESOPHAGEAL STRICTURE (ICD-530.3) GASTROPARESIS (ICD-536.3) DIVERTICULOSIS OF COLON (ICD-562.10) IRRITABLE BOWEL SYNDROME (ICD-564.1) HEMORRHOIDS (ICD-455.6) URINARY INCONTINENCE (ICD-788.30) HEMATURIA, MICROSCOPIC, HX OF (ICD-V13.09) DEGENERATIVE JOINT DISEASE (ICD-715.90) FIBROMYALGIA (ICD-729.1) ANXIETY (ICD-300.00) VITAMIN D DEFICIENCY (ICD-268.9)  Past Surgical History: Last updated: 05/23/2009 S/P hysterectomy in 1985 S/P corneal transplant for keratoconus S/P right bunion surgery S/P left foot fracture w/ ORIF 6/06 by DrDuda  Family History: Last updated: 08/02/2007 Family History of Breast Cancer: aunt No FH of Colon Cancer: Family History of Diabetes: aunt brother uncle Family History of Kidney Disease:father  Social History: Last updated: 08/02/2007 Patient has never smoked.  Alcohol Use - no Illicit Drug  Use - no Patient does not get regular exercise.   Review of Systems      See  HPI  Vital Signs:  Patient profile:   70 year old female Height:      61 inches Weight:      140.38 pounds O2 Sat:      100 % on Room air Temp:     97.9 degrees F oral Pulse rate:   79 / minute BP sitting:   118 / 68  (right arm) Cuff size:   regular  Vitals Entered By: Randell Loop CMA (September 21, 2009 4:11 PM)  O2 Sat at Rest %:  100 O2 Flow:  Room air CC: pt saw foreing body in urine yesterday and was unable to see urology Is Patient Diabetic? No Pain Assessment Patient in pain? yes      Onset of pain  llq area  off and on with an uncomfortable feeling in the pelvic area Comments meds updated today with pt daytime phone number verified with pt Randell Loop CMA  September 21, 2009 4:15 PM    Physical Exam  Additional Exam:  WD, WN, 70 y/o BF in NAD... GENERAL:  Alert & oriented; pleasant & cooperative. HEENT:  [place]/AT, EOM-wnl, PERRLA, EACs-clear, TMs-wnl, NOSE-clear, THROAT-clear & wnl. NECK:  Supple w/ fairROM; no JVD; normal carotid impulses w/o bruits; no thyromegaly or nodules palpated; no lymphadenopathy, CHEST:  Clear to P & A; without wheezes/ rales/ or rhonchi. HEART:  Regular Rhythm; without murmurs/ rubs/ or gallops. ABDOMEN:  Soft & nontender; normal bowel sounds; no organomegaly or masses detected, no guarding or rebound noted. . EXT: without deformities, mild arthritic changes; no varicose veins/ venous insuffic/ or edema. NEURO:   intact w/ no focal deficits noted.  DERM:  clear, no rash noted.      Impression & Recommendations:  Problem # 1:  OTHER ABNORMALITY OF URINATION (WUJ-811.91) ? etiology of insect in urine possible this was in toilet prior to pt urinating or fell out of clother after working in yard.  will check ua/cx  follow up according.   Problem # 2:  IRRITABLE BOWEL SYNDROME (ICD-564.1) add fiber for intermittent constipation   Other Orders: Est. Patient Level III (47829)  Patient Instructions: 1)  Increase fluids.  2)  I will call  with labs  3)  High fiber diet.  4)  Zyrtec once daily for hives and allergies as needed  5)  Please contact office for sooner follow up if symptoms do not improve or worsen

## 2010-03-28 NOTE — Progress Notes (Signed)
Summary: appt w [id number]  Phone Note Call from Patient Call back at [phone]   Caller: Patient Call For: nadel Summary of Call: want to discuss appt with leigh wants to see dr Kriste Basque tomorrow afternoon had appt in the am but she is out of town want be in till afternoon  Initial call taken by: Lacinda Axon,  December 10, 2009 9:30 AM  Follow-up for Phone Call        [email or web address]. Carron Curie CMA  December 10, 2009 10:51 AM   Additional Follow-up for Phone Call Additional follow up Details #1::        called and spoke with pt and she is out of town now having to stay with her grandkids and she will be back in town in nov---appt rsc to nov 22 at 3pm and pt will call 1 week prior to [facility] for labs Randell Loop CMA  December 12, 2009 3:52 PM

## 2010-03-28 NOTE — Progress Notes (Signed)
Summary: reaction  Phone Note Call from Patient   Caller: Patient Call For: Linnie Mcglocklin Summary of Call: prednisone is causing upset stomach [facility] [address] Initial call taken by: Rickard Patience,  March 26, 2009 12:01 PM  Follow-up for Phone Call        called, spoke with pt.  Pt states she has been having "upset stomach" since started on prednisone on 1/28.  c/o abd pain and diarhea. Per pt, will finish prednisone tomorrow.  states carafate usually helps "coat my stomach."  requesting TP's recs.  Will forward to TP-please advise.  Thanks! [facility] [address] Allergies:  ! SULFA ! REGLAN ! MORPHINE PENICILLIN  Follow-up by: Gweneth Dimitri RN,  March 26, 2009 12:20 PM  Additional Follow-up for Phone Call Additional follow up Details #1::        called pt  no n/v. stools loose and upset. no blood needs to advance bland diet.  may stop prednisone , rash is almost gone.  Please contact office for sooner follow up if symptoms do not improve or worsen   Additional Follow-up by: Yolando Gillum NP,  March 26, 2009 2:17 PM

## 2010-03-28 NOTE — Letter (Signed)
Summary: [facility]  [facility]   Imported By: Sherian Rein 05/23/2009 14:37:33  _____________________________________________________________________  External Attachment:    Type:   Image     Comment:   External Document

## 2010-03-28 NOTE — Assessment & Plan Note (Signed)
Summary: Acute NP office visit - rash   Primary Provider/Referring Provider:  Alroy Dust, MD  CC:  rash with redness, bumps, itching and a tingling sensation all over x6days.  states she noticed it after eating out on Saturday that began on the inner thighs and moved upward.  states she took benadryl on Sundlay and Monday which seemed to help, and but the rash returned.Marland Kitchen  History of Present Illness: 70 y/o BF here for a follow up visit... she has multiple medical problems as noted below...     ~  March 23, 2008:  pt last seen Nov08 for CPX- she had mult somatic complaints and was very anxious wanting a repeat cardiac eval w/ Myoview stress test, and cerebrovasc eval to rule out stroke... ** NuclearStressTest 01/13/07 was negative- no ischemia, no infarction, EF= 79%... ** Neuro appt set up but pt didn't follow up & apparently wasn't seen (prev hx of abn MRI in 2006- see below)... she is followed regularly by DrDeveshwar for Rheum due to her FM & she has tried her on Provigil for Idiopathic Hypersomnolence... she has had a major problem w/ her GI track w/ GERD, some dysphagia, and gastroparesis on gastric empying scan 10/08 (75% activity remained at [facility])... intolerant to Reglan, maintained on the Zegerid, Carafate, recent trial of Domperidone... she is frustrated and has set up a second opinion consult at [facility].  March 23, 2009 --Presents for an acute office visit. Complains of rash with redness, bumps, itching and a tingling sensation all over x6days.  states she noticed it after eating out on Saturday that began on the inner thighs and moved upward.  states she took benadryl on Sundlay and Monday which seemed to help, but the rash returned.. No difficulty swallowing, no dyspnea or wheeizng Denies chest pain, dyspnea, orthopnea, hemoptysis, fever, n/v/d, edema, headache,recent travel or antibiotics.    Medications Prior to Update: 1)  Ecotrin Low Strength 81 Mg Tbec (Aspirin) .... Take 1 Tab By  Mouth Once Daily.Marland KitchenMarland Kitchen 2)  Zegerid 40-1100 Mg  Caps (Omeprazole-Sodium Bicarbonate) .... One Tablet By Mouth Two Times A Day 3)  Carafate 1 Gm/70ml  Susp (Sucralfate) .Marland Kitchen.. 10 Cc Three Times A Day As Needed 4)  Domperidone 10 Mg .... One Tablet By Mouth Before Meals and At Bedtime 5)  Detrol La 4 Mg Xr24h-Cap (Tolterodine Tartrate) .... Take 1 Cap By Mouth At Bedtime.Marland KitchenMarland Kitchen 6)  Alprazolam 0.5 Mg Tabs (Alprazolam) .... Take As Directed By Drdeveshwar 7)  Vitamin D 16109 Unit Caps (Ergocalciferol) .... Take 1 Cap By Mouth Each Week... 8)  Fluocinonide 0.05 % Crea (Fluocinonide) .... Apply To Rash Two Times A Day As Needed...  Current Medications (verified): 1)  Ecotrin Low Strength 81 Mg Tbec (Aspirin) .... Take 1 Tab By Mouth Once Daily.Marland KitchenMarland Kitchen 2)  Zegerid 40-1100 Mg  Caps (Omeprazole-Sodium Bicarbonate) .... Take 1 Capsule By Mouth Once A Day 30 Minutes Before Meal 3)  Carafate 1 Gm/64ml  Susp (Sucralfate) .Marland Kitchen.. 10 Cc Three Times A Day As Needed 4)  Domperidone 10 Mg .... One Tablet By Mouth Before Meals and At Bedtime 5)  Detrol La 4 Mg Xr24h-Cap (Tolterodine Tartrate) .... Take 1 Cap By Mouth At Bedtime.Marland KitchenMarland Kitchen 6)  Alprazolam 0.25 Mg Tbdp (Alprazolam) .... As Needed As Directed By Dr. Corliss Skains 7)  Vitamin D 60454 Unit Caps (Ergocalciferol) .... Take 1 Cap By Mouth Each Week...  Allergies (verified): 1)  ! Sulfa 2)  ! Reglan 3)  ! Morphine 4)  Penicillin  Past History:  Family History: Last updated: 08/02/2007 Family History of Breast Cancer: aunt No FH of Colon Cancer: Family History of Diabetes: aunt brother uncle Family History of Kidney Disease:father  Social History: Last updated: 08/02/2007 Patient has never smoked.  Alcohol Use - no Illicit Drug Use - no Patient does not get regular exercise.   Risk Factors: Exercise: no (08/02/2007)  Risk Factors: Smoking Status: never (03/28/2008)  Past Medical History: PHYSICAL EXAMINATION (ICD-V70.0) - GYN= DrCousins w/ PAP Q43yrs...   Mammograms at [facility]- done in 2009 neg x cysts... BMD at [facility]  ~66yrs ago per pt...   Hx of DYSPHONIA (ZOX-096.04) - She has seen ENT- DrTeoh on several occas w/ cerumen impactions, sensorineural hearing loss, dysphonia & globus phenomenon (the latter felt secondary to GERD)... laryngoscopy was neg... she is maintained on ZEGERID 40mg  Bid...  R/O SLEEP APNEA (ICD-780.57) - sleep study 4/08 showed RDI= 6 & some restless leg symptoms (tried Requip)... DrDeveshwar has tried Provigil for Idiopathic Hypersomnolence...  Hx of ASTHMATIC BRONCHITIS, ACUTE (ICD-466.0)  Hx of CHEST PAIN, ATYPICAL (ICD-786.59) -   ~  cardiac cath 1997 showed normal coronaries  ~  2DEcho 9/03 showed mild conc LVH, norm EF, mild AoV sclerosis, mild thickening MV leaflets, mild MR...  ~  NuclearStressTest 11/08 was WNL- no ischemia, no infarct, EF= 79%...     Vital Signs:  Patient profile:   70 year old female Height:      61 inches Weight:      142.38 pounds BMI:     27.00 O2 Sat:      100 % on Room air Temp:     99.1 degrees F oral Pulse rate:   76 / minute BP sitting:   132 / 84  (right arm) Cuff size:   regular  Vitals Entered By: Boone Master CNA (March 23, 2009 10:54 AM)  O2 Flow:  Room air CC: rash with redness, bumps, itching and a tingling sensation all over x6days.  states she noticed it after eating out on Saturday that began on the inner thighs and moved upward.  states she took benadryl on Sundlay and Monday which seemed to help, but the rash returned. Is Patient Diabetic? No Comments Medications reviewed with patient Daytime contact number verified with patient. Boone Master CNA  March 23, 2009 10:54 AM    Physical Exam  Additional Exam:  WD, WN, 70 y/o BF in NAD... GENERAL:  Alert & oriented; pleasant & cooperative. HEENT:  [place]/AT, EOM-wnl, PERRLA, EACs-clear, TMs-wnl, NOSE-clear, THROAT-clear & wnl. NECK:  Supple w/ fairROM; no JVD; normal carotid impulses w/o bruits; no thyromegaly or  nodules palpated; no lymphadenopathy. CHEST:  Clear to P & A; without wheezes/ rales/ or rhonchi. HEART:  Regular Rhythm; without murmurs/ rubs/ or gallops. ABDOMEN:  Soft & nontender; normal bowel sounds; no organomegaly or masses detected. EXT: without deformities, mild arthritic changes; no varicose veins/ venous insuffic/ or edema. NEURO:  CN's intact; motor testing normal; sensory testing normal; gait normal & balance OK. DERM:  along hands urticarial patch along dorsal aspect of hands and between fingers, foreamrs, inner thighs.      Impression & Recommendations:  Problem # 1:  DERMATITIS (ICD-692.9) Urticarial flare ? etiology  REC:  Prednisone taper over next week.  Zyrtec 10mg  at bedtime for 5 days Pepcid 20mg  at bedtime for 5 days.  Avoid hot showers  Sensitive skin lotion, detergents, etc Please contact office for sooner follow up if symptoms do not improve or worsen  follow up Dr.  Nadel for first available physical.  The following medications were removed from the medication list:    Fluocinonide 0.05 % Crea (Fluocinonide) .Marland Kitchen... Apply to rash two times a day as needed... Her updated medication list for this problem includes:    Prednisone 10 Mg Tabs (Prednisone) .Marland KitchenMarland KitchenMarland KitchenMarland Kitchen 4 tabs for 2 days,  2 tabs for 2 days, then 1 tab for 2 days, then stop  Discussed avoidance of triggers and symptomatic treatment.   Orders: Est. Patient Level IV (04540)  Medications Added to Medication List This Visit: 1)  Zegerid 40-1100 Mg Caps (Omeprazole-sodium bicarbonate) .... Take 1 capsule by mouth once a day 30 minutes before meal 2)  Alprazolam 0.25 Mg Tbdp (Alprazolam) .... As needed as directed by dr. Corliss Skains 3)  Prednisone 10 Mg Tabs (Prednisone) .... 4 tabs for 2 days,  2 tabs for 2 days, then 1 tab for 2 days, then stop  Complete Medication List: 1)  Ecotrin Low Strength 81 Mg Tbec (Aspirin) .... Take 1 tab by mouth once daily.Marland KitchenMarland Kitchen 2)  Zegerid 40-1100 Mg Caps (Omeprazole-sodium  bicarbonate) .... Take 1 capsule by mouth once a day 30 minutes before meal 3)  Carafate 1 Gm/90ml Susp (Sucralfate) .Marland Kitchen.. 10 cc three times a day as needed 4)  Domperidone 10 Mg  .... One tablet by mouth before meals and at bedtime 5)  Detrol La 4 Mg Xr24h-cap (Tolterodine tartrate) .... Take 1 cap by mouth at bedtime.Marland KitchenMarland Kitchen 6)  Alprazolam 0.25 Mg Tbdp (Alprazolam) .... As needed as directed by dr. Corliss Skains 7)  Vitamin D 98119 Unit Caps (Ergocalciferol) .... Take 1 cap by mouth each week... 8)  Prednisone 10 Mg Tabs (Prednisone) .... 4 tabs for 2 days,  2 tabs for 2 days, then 1 tab for 2 days, then stop  Patient Instructions: 1)  Prednisone taper over next week.  2)  Zyrtec 10mg  at bedtime for 5 days 3)  Pepcid 20mg  at bedtime for 5 days.  4)  Avoid hot showers  5)  Sensitive skin lotion, detergents, etc 6)  Please contact office for sooner follow up if symptoms do not improve or worsen  7)  follow up Dr. Kriste Basque for first available physical.  Prescriptions: PREDNISONE 10 MG TABS (PREDNISONE) 4 tabs for 2 days,  2 tabs for 2 days, then 1 tab for 2 days, then stop  #15 x 0   Entered and Authorized by:   Rubye Oaks NP   Signed by:   Aaliyha Mumford NP on 03/23/2009   Method used:   Electronically to        [id number] (retail)       [address] [place], [place]  [address]       Ph: [phone]       Fax: [phone]   RxID:   [id number]    Immunization History:  Influenza Immunization History:    Influenza:  historical (12/25/2008)

## 2010-03-28 NOTE — Medication Information (Signed)
Summary: Tax adviser   Imported By: Lehman Prom 06/05/2009 16:26:56  _____________________________________________________________________  External Attachment:    Type:   Image     Comment:   External Document

## 2010-03-28 NOTE — Assessment & Plan Note (Signed)
Summary: physical///kp   Primary Care Provider:  Alroy Dust, MD  CC:  13 month ROV & review of mult medical problems....  History of Present Illness: 70 y/o BF here for a follow up visit... she has multiple medical problems as noted below...     ~  March 23, 2008:  pt last seen Nov08 for CPX- she had mult somatic complaints and was very anxious wanting a repeat cardiac eval w/ Myoview stress test, and cerebrovasc eval to rule out stroke... ** NuclearStressTest 01/13/07 was negative- no ischemia, no infarction, EF= 79%... ** Neuro appt set up but pt didn't follow up & apparently wasn't seen (prev hx of abn MRI in 2006- see below)... she is followed regularly by DrDeveshwar for Rheum due to her FM & she has tried her on Provigil for Idiopathic Hypersomnolence... she has had a major problem w/ her GI track w/ GERD, some dysphagia, and gastroparesis on gastric empying scan 10/08 (75% activity remained at [facility])... intolerant to Reglan, maintained on the Zegerid, Carafate, recent trial of Domperidone... she is frustrated and has set up a second opinion consult at [facility].   ~  May 23, 2009:  she has had GI evals from DrStark, and DrOrman at [facility] w/ IBS, bloating, delayed gastric emptying, globus sensation, fecal incontinence (their notes are reviewed w/ pt)> last note DrOrman 11/10- f/u mult GI complaints; they tried biofeedback, Zegerid, Domperidone 10mg Bid, Remeron Qhs, & ENT referral (Voice therapy)...  she switched the Zegerid to Omep40mg  for $$ reasons; asks that we send copy of colonoscopy to DrOrman; recently seen by GYN= DrCousins and started on LEXAPRO 10mg /d... she is quite anxious & not using her alprazolam regularly & we discussed this...   Current Problems:   PHYSICAL EXAMINATION (ICD-V70.0) - GYN= DrCousins w/ PAP Q57yrs, seen 3/11 & started on Lexapro...  [facility]ammograms at [facility]- done in 2009 neg x cysts... BMD at [facility]  ~60yrs ago per pt (we only have copy of BMD from 2002= WNL w/ pos  TScores)...  Hx of DYSPHONIA (GEX-528.41) - She has seen ENT- DrTeoh on several occas w/ cerumen impactions, sensorineural hearing loss, dysphonia & globus phenomenon (the latter felt secondary to GERD)... laryngoscopy was neg... she was maintained on Zegerid 40mg  Bid & switched to OMEPRAZOLE 40mg /d for $$ reasons...  ~  2010:  referred by [facility] to their ENT Dept for voice therapy...  R/O SLEEP APNEA (ICD-780.57) - sleep study 4/08 showed RDI= 6 & some restless leg symptoms (tried Requip)... DrDeveshwar has tried Provigil for Idiopathic Hypersomnolence...  Hx of ASTHMATIC BRONCHITIS, ACUTE (ICD-466.0) - no recent URIs reported...  Hx of CHEST PAIN, ATYPICAL (ICD-786.59) -   ~  cardiac cath 1997 showed normal coronaries  ~  2DEcho 9/03 showed mild conc LVH, norm EF, mild AoV sclerosis, mild thickening MV leaflets, mild MR...  ~  NuclearStressTest 11/08 was WNL- no ischemia, no infarct, EF= 79%...  CEREBROVASCULAR DISEASE (ICD-437.9) - on ASA 81mg /d... she was eval by DrStiefel for neurology yrs ago...  ~  MRI Brain 6/06 showed remote lacunar infarcts in the cerebellum bilaterally, chr small vessel dis, NAD...  HYPERCHOLESTEROLEMIA (ICD-272.0)0 - on diet alone...  ~  FLP 11/08 showed TChol 212, TG 57, HDL 68, LDL 132  ~  FLP 1/10 showed TChol 207, TG 62, HDL 64, LDL 127... she prefers to continue diet management.  ~  FLP 3/11 showed TChol 238, TG 61, HDL 71, LDL 154... she is reluctant to consider Statin Rx due to side effects.  GERD (ICD-530.81), & ESOPHAGEAL STRICTURE (ICD-530.3) - on OMEPRAZOLE 40mg /d now + off prev Carafate Rx...  ~  last EGD 7/07 by DrStark showed GERD & stricture- dilated...  ~  pt last seen by GI/ DrStark Jun09- outlined plans for Rx w/ Zegerid, Domperidone, Carafate...  ~  2010: she had 2nd opinion consult [facility] DrOrman- mult GI complaints w/ IBS, delayed gastric emptying, hx dysphagia, globus sensation, & fecal incontinence... treated w/ PPI,  Dromperidone, Remeron, & Voice therapy...  GASTROPARESIS (ICD-536.3) - tried REGLAN- discontinued due to reaction w/ anxiety... DrStark added DOMPERIDONE Prn...  ~  Gastric Emptying Scan 10/08 showed 93% activity remaining in the stomach at 60 min, and 75% remaining at 2H (normal is <30% at [facility])...  DIVERTICULOSIS OF COLON (ICD-562.10),  IRRITABLE BOWEL SYNDROME (ICD-564.1), & HEMORRHOIDS (ICD-455.6) -   ~  CT Abd & Pelvis in 2001 was WNL.Marland Kitchen.  ~  last colonoscopy 9/06 by DrStark showed divertics and int hems... she had spasm in the sigmoid region.  URINARY INCONTINENCE (ICD-788.30) - on DETROL LA 4mg /d...  HEMATURIA, MICROSCOPIC, HX OF (ICD-V13.09)  DEGENERATIVE JOINT DISEASE (ICD-715.90) - she has some DDD in lumbar spine and osteoarthritis in knees...  FIBROMYALGIA (ICD-729.1) - followed by DrDeveshwar for Rheum... c/o insomnia, fatigue, fogginess, and mult somatic complaints...Marland KitchenMarland KitchenMarland Kitchen tried Ambien, Provigil, etc...  ANXIETY (ICD-300.00) - prev tried on ALPRAZOLAM 0.5mg - 1/2 to 1 tab three times a day as needed...  VITAMIN D DEFICIENCY (ICD-268.9) - on Vit D 50K weekly...  ~  Vit D level 1/10 = 15 (30-90) therefore started on Vit D 50,000 u weekly...  ~  labs 3/11 showed Vit D level = 39... continue 50K weekly for now.   Allergies: 1)  ! Sulfa 2)  ! Reglan 3)  ! Morphine 4)  Penicillin  Comments:  Nurse/Medical Assistant: The patient's medications and allergies were reviewed with the patient and were updated in the Medication and Allergy Lists.  Past History:  Past Medical History:  Hx of DYSPHONIA (ICD-784.49) R/O SLEEP APNEA (ICD-780.57) Hx of ASTHMATIC BRONCHITIS, ACUTE (ICD-466.0) Hx of CHEST PAIN, ATYPICAL (ICD-786.59) CEREBROVASCULAR DISEASE (ICD-437.9) HYPERCHOLESTEROLEMIA (ICD-272.0) GERD (ICD-530.81) ACUTE CYSTITIS (ICD-595.0) ESOPHAGEAL STRICTURE (ICD-530.3) GASTROPARESIS (ICD-536.3) DIVERTICULOSIS OF COLON (ICD-562.10) IRRITABLE BOWEL SYNDROME  (ICD-564.1) HEMORRHOIDS (ICD-455.6) URINARY INCONTINENCE (ICD-788.30) HEMATURIA, MICROSCOPIC, HX OF (ICD-V13.09) DEGENERATIVE JOINT DISEASE (ICD-715.90) FIBROMYALGIA (ICD-729.1) ANXIETY (ICD-300.00) VITAMIN D DEFICIENCY (ICD-268.9)  Past Surgical History: S/P hysterectomy in 1985 S/P corneal transplant for keratoconus S/P right bunion surgery S/P left foot fracture w/ ORIF 6/06 by DrDuda  Family History: Reviewed history from 08/02/2007 and no changes required. Family History of Breast Cancer: aunt No FH of Colon Cancer: Family History of Diabetes: aunt brother uncle Family History of Kidney Disease:father  Social History: Reviewed history from 08/02/2007 and no changes required. Patient has never smoked.  Alcohol Use - no Illicit Drug Use - no Patient does not get regular exercise.   Review of Systems      See HPI       The patient complains of dyspnea on exertion, abdominal pain, and incontinence.  The patient denies anorexia, fever, weight loss, weight gain, vision loss, decreased hearing, hoarseness, chest pain, syncope, peripheral edema, prolonged cough, headaches, hemoptysis, melena, hematochezia, severe indigestion/heartburn, hematuria, muscle weakness, suspicious skin lesions, transient blindness, difficulty walking, depression, unusual weight change, abnormal bleeding, enlarged lymph nodes, and angioedema.    Vital Signs:  Patient profile:   70 year old female Height:      61 inches Weight:  141 pounds BMI:     26.74 O2 Sat:      98 % on Room air Temp:     99.1 degrees F oral Pulse rate:   72 / minute BP sitting:   104 / 68  (right arm) Cuff size:   regular  Vitals Entered By: Randell Loop CMA (May 23, 2009 10:07 AM)  O2 Sat at Rest %:  98 O2 Flow:  Room air CC: 13 month ROV & review of mult medical problems... Is Patient Diabetic? No Pain Assessment Patient in pain? yes      Comments meds updated today   Physical Exam  Additional Exam:   WD, WN, 69 y/o BF in NAD... GENERAL:  Alert & oriented; pleasant & cooperative... HEENT:  /AT, EOM-wnl, PERRLA, EACs-clear, TMs-wnl, NOSE-clear, THROAT-clear & wnl. NECK:  Supple w/ fairROM; no JVD; normal carotid impulses w/o bruits; no thyromegaly or nodules palpated; no lymphadenopathy. CHEST:  Clear to P & A; without wheezes/ rales/ or rhonchi heard... HEART:  Regular Rhythm; without murmurs/ rubs/ or gallops detected... ABDOMEN:  Soft & nontender; normal bowel sounds; no organomegaly or masses palpated... EXT: without deformities, mild arthritic changes; no varicose veins/ venous insuffic/ or edema. NEURO:  CN's intact; motor testing normal; sensory testing normal; gait normal & balance OK. DERM:  no lesions seen...    MISC. Report  Procedure date:  05/23/2009  Findings:      Lipid Panel (LIPID)   Cholesterol          [H]  238 mg/dL                   4-540   Triglycerides             61.0 mg/dL                  9.8-119.1   HDL                       47.82 mg/dL                 >95.62 Cholesterol LDL - Direct                             153.7 mg/dL   BMP (METABOL)   Sodium                    143 mEq/L                   135-145   Potassium                 4.4 mEq/L                   3.5-5.1   Chloride                  105 mEq/L                   96-112   Carbon Dioxide            32 mEq/L                    19-32   Glucose                   88 mg/dL  70-99   BUN                       11 mg/dL                    6-21   Creatinine                1.1 mg/dL                   3.0-8.6   Calcium                   9.3 mg/dL                   5.7-84.6   GFR                       63.15 mL/min                >60   Hepatic/Liver Function Panel (HEPATIC)   Total Bilirubin           0.7 mg/dL                   9.6-2.9   Direct Bilirubin          0.1 mg/dL                   5.2-8.4   Alkaline Phosphatase      76 U/L                      39-117   AST                        21 U/L                      0-37   ALT                       14 U/L                      0-35   Total Protein             7.2 g/dL                    1.3-2.4   Albumin                   3.9 g/dL                    4.0-1.0  Comments:      CBC Platelet w/Diff (CBCD)   White Cell Count          5.3 K/uL                    4.5-10.5   Red Cell Count            4.44 Mil/uL                 3.87-5.11   Hemoglobin                13.4 g/dL                   27.2-53.6   Hematocrit                41.0 %  36.0-46.0   MCV                       92.3 fl                     78.0-100.0   Platelet Count            164.0 K/uL                  150.0-400.0   Neutrophil %              63.1 %                      43.0-77.0   Lymphocyte %              28.9 %                      12.0-46.0   Monocyte %                6.9 %                       3.0-12.0   Eosinophils%              0.8 %                       0.0-5.0   Basophils %               0.3 %                       0.0-3.0   TSH (TSH)   FastTSH                   2.05 uIU/mL                 0.35-5.50  Vitamin D (25-Hydroxy) (62952)  Vitamin D (25-Hydroxy)                             39 ng/mL                    30-89   Impression & Recommendations:  Problem # 1:  GI>>> As documented by DrStark, & [facility]... mult GI complaints w/ dx of IBS, delayed gastric emptying, globus sensation, etc... Continue PPI Rx (Omep40), Dromperidone, Remeron, Voice therapy - per [facility]... Add Simethacone preps Prn for gas, bloating...  Problem # 2:  Cardiovasc>> Continue coated ASA 81mg /d...   Problem # 3:  HYPERCHOLESTEROLEMIA (ICD-272.0) She is maintained on Diet alone, prefers not to take Statin Rx due to potential side effects and her poor drug tolerability... We discussed low Chol/ low fat diet, increased exercise, etc...  Problem # 4:  FIBROMYALGIA (ICD-729.1) She is followed by DrDeveshwar for rheum, but last seen  ~4yr ago. Her updated  medication list for this problem includes:    Ecotrin Low Strength 81 Mg Tbec (Aspirin) .Marland Kitchen... Take 1 tab by mouth once daily...  Problem # 5:  ANXIETY (ICD-300.00) I feel it would be beneficial for pt to take Alprazolam more regularly for her underlying anxiety... She was started on Lexapro by GYN= DrCousins... Her updated medication list for this problem includes:    Alprazolam 0.5 Mg Tabs (Alprazolam) .Marland Kitchen... Take 1/2 to 1 tab by mouth three times  a day...    Lexapro 10 Mg Tabs (Escitalopram oxalate) .Marland Kitchen... Take 1 tab by mouth once daily as directed by drcousins...  Problem # 6:  OTHER MEDICAL PROBLEMS AS NOTED>>>  Complete Medication List: 1)  Ecotrin Low Strength 81 Mg Tbec (Aspirin) .... Take 1 tab by mouth once daily.Marland KitchenMarland Kitchen 2)  Omeprazole 40 Mg Cpdr (Omeprazole) .... Take 1 tab by mouth once daily - 30 min before a meal... 3)  Detrol La 4 Mg Xr24h-cap (Tolterodine tartrate) .... Take 1 cap by mouth at bedtime.Marland KitchenMarland Kitchen 4)  Alprazolam 0.5 Mg Tabs (Alprazolam) .... Take 1/2 to 1 tab by mouth three times a day... 5)  Vitamin D (ergocalciferol) 50000 Unit Caps (Ergocalciferol) .... Take one cap by mouth each week... 6)  Lexapro 10 Mg Tabs (Escitalopram oxalate) .... Take 1 tab by mouth once daily as directed by drcousins...  Other Orders: Prescription Created Electronically [phone])  Patient Instructions: 1)  Today we updated your med list- see below.... 2)  We refilled your meds per your request... 3)  Today we did your follow up FASTING blood work... please call the "phone tree" in a few days for your lab results.Marland KitchenMarland Kitchen 4)  For the GAS:  use SIMETHACONE OTC- eg. Mylicon, Mylanta GAS, Phazyme, etc... plan to take it 4 times daily til the gas settles down... 5)  At your convenience> contact a dermatologist for review...  6)  Call for any problems.Marland KitchenMarland Kitchen 7)  Please schedule a follow-up appointment in 6 months. Prescriptions: VITAMIN D (ERGOCALCIFEROL) 50000 UNIT CAPS (ERGOCALCIFEROL) take one cap by mouth  each week...  #4 x prn   Entered and Authorized by:   Michele Mcalpine MD   Signed by:   Michele Mcalpine MD on 05/23/2009   Method used:   Print then Give to Patient   RxID:   [id number] ALPRAZOLAM 0.5 MG TABS (ALPRAZOLAM) take 1/2 to 1 tab by mouth three times a day...  #100 x 6   Entered and Authorized by:   Michele Mcalpine MD   Signed by:   Michele Mcalpine MD on 05/23/2009   Method used:   Print then Give to Patient   RxID:   [id number] DETROL LA 4 MG XR24H-CAP (TOLTERODINE TARTRATE) take 1 cap by mouth at bedtime...  #30 x prn   Entered and Authorized by:   Michele Mcalpine MD   Signed by:   Michele Mcalpine MD on 05/23/2009   Method used:   Print then Give to Patient   RxID:   [id number] OMEPRAZOLE 40 MG CPDR (OMEPRAZOLE) take 1 tab by mouth once daily - 30 min before a meal...  #30 x prn   Entered and Authorized by:   Michele Mcalpine MD   Signed by:   Michele Mcalpine MD on 05/23/2009   Method used:   Print then Give to Patient   RxID:   [id number]   Appended Document: physical///kp OV, labs mailed to Dr. Randa Lynn [address], [place], [place] [address] Attn: Mila Palmer, [facility] GI Procedures per Dr. Jodelle Green request.

## 2010-03-28 NOTE — Progress Notes (Signed)
Summary: speak to nurse  Phone Note Call from Patient Call back at Home Phone [phone]   Caller: Patient Call For: tammy parrett Summary of Call: pt had an appt this am w/ tp. (she called and cx this). wants to speak to nurse. call home # 1st or [phone] Initial call taken by: Tivis Ringer, CNA,  October 10, 2009 11:42 AM  Follow-up for Phone Call        Spoke with pt.  Pt states she was wanting to talk with someone to let us know she cancelled OV with TP today because rash is starting to look better and not itching as much.  Advised her if rash becomes worse or does not get better to call back for OV--she verbalized understanding of this.  Gweneth Dimitri RN  October 10, 2009 12:06 PM

## 2010-03-28 NOTE — Progress Notes (Signed)
Summary: results of labs  Phone Note Call from Patient Call back at Home Phone [phone]   Caller: Patient-- Reason for Call: Talk to Nurse, Lab or Test Results Summary of Call: Requesting results of urine test done Friday. Initial call taken by: Lehman Prom,  September 27, 2009 12:49 PM  Follow-up for Phone Call        Please advise on urine culture results and recommendations for pt. Abigail Miyamoto RN  September 27, 2009 1:08 PM   Additional Follow-up for Phone Call Additional follow up Details #1::        her urine was clear.  cont w/ ov recs.  Additional Follow-up by: Rubye Oaks NP,  September 27, 2009 2:17 PM    Additional Follow-up for Phone Call Additional follow up Details #2::    called and spoke with pt. pt aware of TP's response and recs.  pt verbalized understanding and denied any questions.  [name] Reynolds LPN  September 27, 2009 2:35 PM

## 2010-03-28 NOTE — Progress Notes (Signed)
Summary: needs f/u w/ sn (recent ER visit) > f/u w/ Dr. Anne Hahn  Phone Note Call from Patient Call back at Home Phone [phone]   Caller: Patient Call For: nadel Summary of Call: pt was seen at [facility] last thurs- fri for "stroke-like symptoms". was told to f/u w/ dr Kriste Basque  Initial call taken by: Tivis Ringer, CNA,  March 11, 2010 12:25 PM  Follow-up for Phone Call        Spoke with pt.  She states that she was went to ED on 03/07/10 and dxed with TIA.  She is aware that she needs to followup with Dr Anne Hahn, but was unsure what SN would rec for her.  She wants to know if he needs to see her as well.  Pls advise, thanks! Follow-up by: Vernie Murders,  March 11, 2010 2:48 PM  Additional Follow-up for Phone Call Additional follow up Details #1::        per SN: if Dr. Anne Hahn can't see her in 1-2weeks, then add on for me this week or next.  called spoke with patient, advised of SN's recs as stated above.  pt okay with this and verbalized her understanding.  pt to call back after speaking with Dr. Anne Hahn' office. Additional Follow-up by: Boone Master CNA/MA,  March 11, 2010 5:28 PM

## 2010-03-28 NOTE — Letter (Signed)
Summary: ENT/[facility]  ENT/[facility]   Imported By: Sherian Rein 08/13/2009 13:57:40  _____________________________________________________________________  External Attachment:    Type:   Image     Comment:   External Document

## 2010-03-28 NOTE — Assessment & Plan Note (Signed)
Summary: 7 month follow up/la   Primary Care Provider:  Alroy Dust, MD  CC:  8 month ROV & review of mult medical problems....  History of Present Illness: 70 y/o BF here for a follow up visit... she has multiple medical problems as noted below...     ~  March 23, 2008:  pt last seen Nov08 for CPX- she had mult somatic complaints and was very anxious wanting a repeat cardiac eval w/ Myoview stress test, and cerebrovasc eval to rule out stroke... ** NuclearStressTest 01/13/07 was negative- no ischemia, no infarction, EF= 79%... ** Neuro appt set up but pt didn't follow up & apparently wasn't seen (prev hx of abn MRI in 2006- see below)... she is followed regularly by DrDeveshwar for Rheum due to her FM & she has tried her on Provigil for Idiopathic Hypersomnolence... she has had a major problem w/ her GI track w/ GERD, some dysphagia, and gastroparesis on gastric empying scan 10/08 (75% activity remained at [facility])... intolerant to Reglan, maintained on the Zegerid, Carafate, recent trial of Domperidone... she is frustrated and has set up a second opinion consult at [facility].   ~  May 23, 2009:  she has had GI evals from DrStark, and DrOrman at [facility] w/ IBS, bloating, delayed gastric emptying, globus sensation, fecal incontinence (their notes are reviewed w/ pt)> last note DrOrman 11/10- f/u mult GI complaints; they tried biofeedback, Zegerid, Domperidone 10mg Bid, Remeron Qhs, & ENT referral (Voice therapy)...  she switched the Zegerid to [facility]  for $$ reasons; asks that we send copy of colonoscopy to DrOrman; recently seen by GYN= DrCousins and started on LEXAPRO 10mg /d... she is quite anxious & not using her Alprazolam regularly & we discussed this...   ~  January 15, 2010:  she returns w/ mult somatic complaints> HA in left temple area, SOB- can't get a DB, pressure in abd, occas ankle pain, etc... she has seen DrDarling & Orman at [facility] w/ Dx of IBS, delayed gastric emptying, nausea, bloating,  fecal incontinence- they wanted her to continue Remeron for sleep, Domperidone for delayed gastric emptying, & gave her a course of Rifaximin for the bloating... she also saw DrBuckmire [facility] for hoarseness w/ dysphonia, vocal fold weakness, & an epithelial lesion that was biopsied & benign... she also saw DrWillis for Neuro 7/11 w/ c/o memory problems & he did labs/ MRI Brain (we have not received f/u note)...    Current Problems:   PHYSICAL EXAMINATION (ICD-V70.0) - GYN= DrCousins w/ PAP Q35yrs, seen 3/11 & started on Lexapro...  Mammograms at [facility]- done in 2009 neg x cysts... BMD at [facility]  ~46yrs ago per pt (we only have copy of BMD from 2002= WNL w/ pos TScores)...  Hx of DYSPHONIA (ZOX-096.04) - She has seen ENT- DrTeoh on several occas w/ cerumen impactions, sensorineural hearing loss, dysphonia & globus phenomenon (the latter felt secondary to GERD)... laryngoscopy was neg... she was maintained on Zegerid 40mg  Bid & switched to OMEPRAZOLE 40mg /d for $$ reasons...  ~  2010:  referred by [facility] to their ENT Dept for voice therapy w/ eval by DrBuckmire- dysphonia & vocal fold weakness, plus an epithelial lesion that was biopsied & benign...  R/O SLEEP APNEA (ICD-780.57) - sleep study 4/08 showed RDI= 6 & some restless leg symptoms (tried Requip)... DrDeveshwar has tried Provigil for Idiopathic Hypersomnolence...  Hx of ASTHMATIC BRONCHITIS, ACUTE (ICD-466.0) - no recent URIs reported...  Hx of CHEST PAIN, ATYPICAL (ICD-786.59) -   ~  cardiac cath  1997 showed normal coronaries  ~  2DEcho 9/03 showed mild conc LVH, norm EF, mild AoV sclerosis, mild thickening MV leaflets, mild MR...  ~  NuclearStressTest 11/08 was WNL- no ischemia, no infarct, EF= 79%...  CEREBROVASCULAR DISEASE (ICD-437.9) - on ASA 81mg /d... she was eval by DrStiefel for neurology yrs ago...  ~  MRI Brain 6/06 showed remote lacunar infarcts in the cerebellum bilaterally, chr small vessel dis,  NAD...  HYPERCHOLESTEROLEMIA (ICD-272.0)0 - on diet alone...  ~  FLP 11/08 showed TChol 212, TG 57, HDL 68, LDL 132  ~  FLP 1/10 showed TChol 207, TG 62, HDL 64, LDL 127... she prefers to continue diet management.  ~  FLP 3/11 showed TChol 238, TG 61, HDL 71, LDL 154... she is reluctant to consider Statin Rx due to side effects.  ~  FLP 11/11 showed TChol 221, TG 54, HDL 66, LDL 129  GERD (ICD-530.81), & ESOPHAGEAL STRICTURE (ICD-530.3) - on OMEPRAZOLE 40mg /d now + off prev Carafate Rx...  ~  last EGD 7/07 by DrStark showed GERD & stricture- dilated...  ~  pt last seen by GI/ DrStark Jun09- outlined plans for Rx w/ Zegerid, Domperidone, Carafate...  ~  2010: she had 2nd opinion consult [facility] DrOrman & Piedad Climes- mult GI complaints w/ IBS, delayed gastric emptying, hx dysphagia, globus sensation, & fecal incontinence... treated w/ PPI, Dromperidone, Remeron, & Voice therapy...  GASTROPARESIS (ICD-536.3) - tried REGLAN- discontinued due to reaction w/ anxiety... DrStark added DOMPERIDONE Prn...  ~  Gastric Emptying Scan 10/08 showed 93% activity remaining in the stomach at 60 min, and 75% remaining at 2H (normal is <30% at [facility])...  DIVERTICULOSIS OF COLON (ICD-562.10),  IRRITABLE BOWEL SYNDROME (ICD-564.1), & HEMORRHOIDS (ICD-455.6) -   ~  CT Abd & Pelvis in 2001 was WNL.Marland Kitchen.  ~  last colonoscopy 9/06 by DrStark showed divertics and int hems... she had spasm in the sigmoid region.  URINARY INCONTINENCE (ICD-788.30) - on DETROL LA 4mg /d...  HEMATURIA, MICROSCOPIC, HX OF (ICD-V13.09)  DEGENERATIVE JOINT DISEASE (ICD-715.90) - she has some DDD in lumbar spine and osteoarthritis in knees...  FIBROMYALGIA (ICD-729.1) - followed by DrDeveshwar for Rheum... c/o insomnia, fatigue, fogginess, and mult somatic complaints "I stay tired"...... tried Ambien, Provigil, etc...  ANXIETY (ICD-300.00) - prev tried on ALPRAZOLAM 0.5mg - 1/2 to 1 tab three times a day as needed... DrDarling at [facility] rec REMERON  15mg  for sleep... DrCousins, GYN Rx w/ LEXAPRO 10mg /d...  VITAMIN D DEFICIENCY (ICD-268.9) - on Vit D 50K weekly...  ~  Vit D level 1/10 = 15 (30-90) therefore started on Vit D 50,000 u weekly...  ~  labs 3/11 showed Vit D level = 39  ~  labs 11/11 showed Vit D level = 59... continue 50K weekly at her request..   Preventive Screening-Counseling & Management  Alcohol-Tobacco     Smoking Status: never  Caffeine-Diet-Exercise     Does Patient Exercise: no  Allergies: 1)  ! Sulfa 2)  ! Reglan 3)  ! Morphine 4)  Penicillin  Comments:  Nurse/Medical Assistant: The patient's medications and allergies were reviewed with the patient and were updated in the Medication and Allergy Lists.  Past History:  Past Medical History: Hx of DYSPHONIA (ICD-784.49) R/O SLEEP APNEA (ICD-780.57) Hx of ASTHMATIC BRONCHITIS, ACUTE (ICD-466.0) Hx of CHEST PAIN, ATYPICAL (ICD-786.59) CEREBROVASCULAR DISEASE (ICD-437.9) HYPERCHOLESTEROLEMIA (ICD-272.0) GERD (ICD-530.81) ACUTE CYSTITIS (ICD-595.0) ESOPHAGEAL STRICTURE (ICD-530.3) GASTROPARESIS (ICD-536.3) DIVERTICULOSIS OF COLON (ICD-562.10) IRRITABLE BOWEL SYNDROME (ICD-564.1) HEMORRHOIDS (ICD-455.6) URINARY INCONTINENCE (ICD-788.30) HEMATURIA, MICROSCOPIC, HX OF (ICD-V13.09)  DEGENERATIVE JOINT DISEASE (ICD-715.90) FIBROMYALGIA (ICD-729.1) ANXIETY (ICD-300.00) VITAMIN D DEFICIENCY (ICD-268.9)  Past Surgical History: S/P hysterectomy in 1985 S/P corneal transplant for keratoconus S/P right bunion surgery S/P left foot fracture w/ ORIF 6/06 by DrDuda  Family History: Reviewed history from 08/02/2007 and no changes required. Family History of Breast Cancer: aunt No FH of Colon Cancer: Family History of Diabetes: aunt brother uncle Family History of Kidney Disease:father  Social History: Reviewed history from 08/02/2007 and no changes required. Patient has never smoked.  Alcohol Use - no Illicit Drug Use - no Patient does not get  regular exercise.   Review of Systems      See HPI       The patient complains of hoarseness, dyspnea on exertion, abdominal pain, and muscle weakness.  The patient denies anorexia, fever, weight loss, weight gain, vision loss, decreased hearing, chest pain, syncope, peripheral edema, prolonged cough, headaches, hemoptysis, melena, hematochezia, severe indigestion/heartburn, hematuria, incontinence, suspicious skin lesions, transient blindness, difficulty walking, depression, unusual weight change, abnormal bleeding, enlarged lymph nodes, and angioedema.    Vital Signs:  Patient profile:   70 year old female Height:      61 inches Weight:      141.25 pounds BMI:     26.79 O2 Sat:      99 % on Room air Temp:     97.3 degrees F oral Pulse rate:   61 / minute BP sitting:   118 / 68  (left arm) Cuff size:   regular  Vitals Entered By: Randell Loop CMA (January 15, 2010 3:48 PM)  O2 Sat at Rest %:  99 O2 Flow:  Room air CC: 8 month ROV & review of mult medical problems... Is Patient Diabetic? No Pain Assessment Patient in pain? yes      Onset of pain  stomach pain at times/pain in her temple at times/left ankle pain Comments meds updated today with pt   Physical Exam  Additional Exam:  WD, WN, 69 y/o BF in NAD... GENERAL:  Alert & oriented; pleasant & cooperative... HEENT:  [place]/AT, EOM-wnl, PERRLA, EACs-clear, TMs-wnl, NOSE-clear, THROAT-clear & wnl. NECK:  Supple w/ fairROM; no JVD; normal carotid impulses w/o bruits; no thyromegaly or nodules palpated; no lymphadenopathy. CHEST:  Clear to P & A; without wheezes/ rales/ or rhonchi heard... HEART:  Regular Rhythm; without murmurs/ rubs/ or gallops detected... ABDOMEN:  Soft & nontender; normal bowel sounds; no organomegaly or masses palpated... EXT: without deformities, mild arthritic changes; no varicose veins/ venous insuffic/ or edema. NEURO:  CN's intact; motor testing normal; sensory testing normal; gait normal & balance  OK. DERM:  no lesions seen...    MISC. Report  Procedure date:  01/14/2010  Findings:      Lipid Panel (LIPID)   Cholesterol          [H]  221 mg/dL                   4-098   Triglycerides             54.0 mg/dL                  1.1-914.7   HDL                       82.95 mg/dL                 >62.13  Cholesterol LDL - Direct  129.2 mg/dL  Vitamin D (16-XWRUEAV) (40981)  Vitamin D (25-Hydroxy)                             59 ng/mL                    30-89   Impression & Recommendations:  Problem # 1:  Hx of DYSPHONIA (ICD-784.49) Stable>  followed by ENT at [facility]...  Problem # 2:  CEREBROVASCULAR DISEASE (ICD-437.9) She remains on ASA daily & stable w/o cerebral ischemic symptoms...  Problem # 3:  HYPERCHOLESTEROLEMIA (ICD-272.0) FLP is stable on diet alone & she refuses statin Rx...  Problem # 4:  GERD (ICD-530.81) She has GERD & delayed gastric emptying on PPI + Domperidone per DrDarling [facility]... Her updated medication list for this problem includes:    Omeprazole 40 Mg Cpdr (Omeprazole) .Marland Kitchen... Take 1 tab by mouth once daily - 30 min before a meal...  Problem # 5:  IRRITABLE BOWEL SYNDROME (ICD-564.1) Similarly followed at [facility] GI dept...  Problem # 6:  FIBROMYALGIA (ICD-729.1) Followed by DrDeveshwar for Rheum... Her updated medication list for this problem includes:    Ecotrin Low Strength 81 Mg Tbec (Aspirin) .Marland Kitchen... Take 1 tab by mouth once daily...  Problem # 7:  ANXIETY (ICD-300.00) Encouraged to take the Alpraz more regularly... Her updated medication list for this problem includes:    Alprazolam 0.5 Mg Tabs (Alprazolam) .Marland Kitchen... Take 1/2 to 1 tab by mouth at bedtime    Lexapro 10 Mg Tabs (Escitalopram oxalate) .Marland Kitchen... Take 1 tab by mouth once daily as directed by drcousins...    Remeron 15 Mg Tabs (Mirtazapine) .Marland Kitchen... Take 1 tab by mouth at bedtime...  Problem # 8:  OTHER MEDICAL PROBLEMS AS NOTED>>> OK 2011 Flu shot...  Complete  Medication List: 1)  Ecotrin Low Strength 81 Mg Tbec (Aspirin) .... Take 1 tab by mouth once daily.Marland KitchenMarland Kitchen 2)  Omeprazole 40 Mg Cpdr (Omeprazole) .... Take 1 tab by mouth once daily - 30 min before a meal... 3)  Domperidone 10mg   .... Take 1 tab by mouth two times a day as directed by drdarling... 4)  Detrol La 4 Mg Xr24h-cap (Tolterodine tartrate) .... Take 1 cap by mouth at bedtime.Marland KitchenMarland Kitchen 5)  Vitamin D (ergocalciferol) 50000 Unit Caps (Ergocalciferol) .... Take one cap by mouth each week... 6)  Alprazolam 0.5 Mg Tabs (Alprazolam) .... Take 1/2 to 1 tab by mouth at bedtime 7)  Lexapro 10 Mg Tabs (Escitalopram oxalate) .... Take 1 tab by mouth once daily as directed by drcousins.Marland KitchenMarland Kitchen 8)  Remeron 15 Mg Tabs (Mirtazapine) .... Take 1 tab by mouth at bedtime...  Other Orders: Influenza Vaccine MCR (19147)  Patient Instructions: 1)  Today we updated your med list- see below.... 2)  Continue your current meds the same... 3)  We reviewed your recent Lipid result> keep up the good work on diet Rx... 4)  Call for any problems.Marland KitchenMarland Kitchen 5)  Please schedule a follow-up appointment in 6 months.   Immunizations Administered:  Influenza Vaccine # 1:    Vaccine Type: Fluvax MCR    Site: right deltoid    Mfr: GlaxoSmithKline    Dose: 0.5 ml    Route: IM    Given by: Randell Loop CMA    Exp. Date: 08/24/2010    Lot #: [id number]    VIS given: 09/18/09 version given January 15, 2010.  Flu Vaccine Consent Questions:    Do  you have a history of severe allergic reactions to this vaccine? no    Any prior history of allergic reactions to egg and/or gelatin? no    Do you have a sensitivity to the preservative Thimersol? no    Do you have a past history of Guillan-Barre Syndrome? no    Do you currently have an acute febrile illness? no    Have you ever had a severe reaction to latex? no    Vaccine information given and explained to patient? yes    Are you currently pregnant? no

## 2010-03-28 NOTE — Letter (Signed)
Summary: [facility]   Imported By: Lennie Odor 11/08/2009 15:51:41  _____________________________________________________________________  External Attachment:    Type:   Image     Comment:   External Document

## 2010-03-28 NOTE — Progress Notes (Signed)
Summary: appt  Phone Note Call from Patient Call back at Home Phone [phone] Call back at [phone]   Caller: Patient Call For: nadel Reason for Call: Talk to Nurse Summary of Call: rash on body, uncomfortable, taking benedryl, 4 days, itches really bad, really red, thighs legs arms and hands.  Would like to be seen today. Initial call taken by: Eugene Gavia,  March 23, 2009 8:31 AM  Follow-up for Phone Call        called and spoke with pt and she stated that this rash started on sat in just a few areas and itches really bad--she did use benadryl and cortisone cream with little releif----made appt for pt to see TP today Randell Loop [facility]  March 23, 2009 8:46 AM

## 2010-03-28 NOTE — Letter (Signed)
Summary: GI Medicine/[facility]  GI Medicine/[facility]   Imported By: Sherian Rein 05/16/2009 11:36:33  _____________________________________________________________________  External Attachment:    Type:   Image     Comment:   External Document

## 2010-03-28 NOTE — Progress Notes (Signed)
Summary: neurologist- lmtcb x 2  Phone Note Call from Patient   Caller: Patient Call For: nadel Summary of Call: pt wants the name that was given to her before re: neurologist referrall. pt # [id number] Initial call taken by: Tivis Ringer, CNA,  August 31, 2009 12:05 PM  Follow-up for Phone Call        I called and advised pt, per SN note last saw DrStiefel for neurology years ago. Pt c/o burning in the back of her head and memory loss. Pt requesting SN to provide a new referal to a different MD. Please advise. Thanks. Zackery Barefoot CMA  August 31, 2009 12:28 PM   Additional Follow-up for Phone Call Additional follow up Details #1::        per SN---rec [facility]---Dr. Corrinne Eagle for eval locally  or for neuro at [facility]  Dr. Laurell Josephs  is a memory specialist.  Kari Baars Randell Loop CMA  August 31, 2009 4:28 PM     Additional Follow-up for Phone Call Additional follow up Details #2::    lmomtcb Vernie Murders  September 03, 2009 4:11 PM  lm if still having questions  to call back and leave new msg  Follow-up by: Philipp Deputy CMA,  September 04, 2009 3:03 PM

## 2010-03-28 NOTE — Letter (Signed)
Summary: ENT/[facility]  ENT/[facility]   Imported By: Sherian Rein 10/18/2009 10:00:47  _____________________________________________________________________  External Attachment:    Type:   Image     Comment:   External Document

## 2010-04-10 NOTE — Consult Note (Signed)
Paige Pena, Paige Pena NO.:  [id number]  MEDICAL RECORD NO.:  [id number]          PATIENT TYPE:  EMS  LOCATION:  MAJO                         FACILITY:  [facility]  PHYSICIAN:  Melvyn Novas, M.D.  DATE OF BIRTH:  1941-02-25  DATE OF CONSULTATION:  03/08/2010 DATE OF DISCHARGE:  03/07/2010                                CONSULTATION   REASON FOR CONSULTATION:  Transient aphasia greater than 24 hours in the past.  HISTORY OF PRESENT ILLNESS:  This is a 70 year old African American female who awoke on March 07, 2010, in the morning noting that she had dysarthria and aphasia.  The patient states this occurred approximately at 8:00 a.m.  The patient got up, went to talk to her husband and noted that she could not get her words out.  Husband who is at her bedside states that her speech sounded slurred, and she had some expressive aphasia.  Due to frustration, the patient went back to sleep up until 12 that afternoon.  On awakening, the patient states that she did not have any signs or symptoms of slurred speech at that time.  She went about her normal daily activities that afternoon.  However, that night she thought about the events that had occurred in the morning and decided to come into the emergency department.  Her husband drove her to the emergency department for further workup.  The patient was brought to the CDU of [facility], and the TIA protocol was initiated.  At the present time, the patient is alert.  She is oriented.  She shows no signs and symptoms of stroke or previous event.  She is able to give a good history and converse without difficulty.  PAST MEDICAL HISTORY: 1. Left foot Lisfranc's fracture, status ORIF. 2. Fibromyalgia. 3. Irritable bowel syndrome. 4. GERD. 5. Incontinence. 6. Questionable neuropathy.  MEDICATIONS:  The patient is on alprazolam p.r.n., omeprazole 40 mg daily, Detrol LA 4 mg daily, aspirin 81 mg which she does  not take regularly, Lexapro daily which she again does not take regularly, Zyrtec, and multivitamin.  ALLERGIES:  SULFA and PENICILLIN.  SOCIAL HISTORY:  The patient does not smoke.  She drinks occasionally. She does not do illicit drugs.  REVIEW OF SYSTEMS:  Positive for dysarthria, aphasia, right eye difficulties secondary to contact lens, incontinence, generalized aches and pains, and depression.  PHYSICAL EXAMINATION:  VITAL SIGNS:  Blood pressure is 136/68, pulse 73, respirations 12, and temperature 98.2. GENERAL:  The patient is alert and oriented x3, carries out 2 and 3-step commands. NEUROLOGIC:  Pupils are equal, round, reactive to light and conjugate gaze.  Extraocular movements are intact.  Visual fields are grossly intact.  Face is symmetrical.  Tongue is midline.  Uvula is midline. Sensation is full to pinprick, light touch.  Shoulder shrug and head turn is within normal limits.  Gait was within normal limits.  Good arms and palm strength and a narrow based gait.  The patient's coordination, finger-to-nose, heel-to-shin was smooth.  The patient's motor was 5/5 globally.  Deep tendon reflexes were 2+ and brisk.  Downgoing toes bilaterally. PULMONARY:  Clear to auscultation. CARDIOVASCULAR:  Regular rate and rhythm.  No murmurs or gallops. NECK:  Negative for bruits and supple.  Sensation is full to pinprick, light touch, and vibration.  LABORATORY DATA:  UA is negative.  Sodium 140, potassium 3.8, chloride 105, CO2 of 29, BUN 9, creatinine 0.94, glucose 92.  Hemoglobin 12.2, hematocrit 38.2, white blood cell count 4.8, platelets 168. Triglycerides 107, cholesterol 182, HDL 55, LDL 106.  Carotid Dopplers were negative for any ICA stenosis.  MRI brain showed mild tonsillar ectopia versus early caries, however, it was negative for any acute stroke.  It did show age-related white matter atrophy in the periventricular areas.  MRA of head showed dolichoectasia of the  ICA, left greater than right, but no significant stenosis.  The 2-D echo showed no PFO vegetation or heart failure.  ASSESSMENT:  This is a 70 year old African American female with transient dysarthria and expressive aphasia that has now resolved and occurred greater than 24 hours ago.  At this time due to the fact that the patient was not on aspirin on a regular basis, she is not considered to be an aspirin failure, so we recommend to the patient that she continues taking aspirin 81 mg every day.  Recommend the patient follow up Dr. Pearlean Brownie in 2 months for followup.  ICD-9 code is 434.91.     Felicie Morn, PA-C   ______________________________ Melvyn Novas, M.D.    DS/MEDQ  D:  03/08/2010  T:  03/09/2010  Job:  [id number]  cc:   Pramod P. Pearlean Brownie, MD C. Lesia Sago, M.D.  Electronically Signed by Felicie Morn PA-C on 03/13/2010 12:15:18 PM Electronically Signed by Melvyn Novas M.D. on 04/10/2010 09:30:07 AM

## 2010-04-11 NOTE — Letter (Signed)
Summary: [facility]  [facility]   Imported By: Lennie Odor 04/01/2010 10:31:55  _____________________________________________________________________  External Attachment:    Type:   Image     Comment:   External Document

## 2010-04-12 ENCOUNTER — Telehealth (INDEPENDENT_AMBULATORY_CARE_PROVIDER_SITE_OTHER): Payer: Self-pay | Admitting: *Deleted

## 2010-04-15 ENCOUNTER — Encounter: Payer: Self-pay | Admitting: Adult Health

## 2010-04-15 ENCOUNTER — Ambulatory Visit (INDEPENDENT_AMBULATORY_CARE_PROVIDER_SITE_OTHER): Payer: Medicare Other | Admitting: Adult Health

## 2010-04-15 DIAGNOSIS — M1990 Unspecified osteoarthritis, unspecified site: Secondary | ICD-10-CM

## 2010-04-17 NOTE — Progress Notes (Signed)
Summary: pain in left leg moved to thigh   Phone Note Call from Patient   Caller: Patient Call For: nadel Summary of Call: patient phoned stated that she had a mini stroke the last part of Jan and she have pain in her left leg it is like a discomfort it has been in the lower part of her leg and has now moved up to her thigh. Patient would like a call back she can be reached at [phone] or on her cell [phone] Initial call taken by: Vedia Coffer,  April 12, 2010 3:57 PM  Follow-up for Phone Call        called and spoke with pt and she stated that the last few days has had pain in left lower leg---today the pain in higher in the left leg near her groin area and is painful when she walks.  stated this leg may be a little bit bigger than her right leg, but pt is really not sure---no other complaints.  please advise. thanks Randell Loop CMA  April 12, 2010 4:12 PM   Additional Follow-up for Phone Call Additional follow up Details #1::        per SN----she will need ov next week to be checked---for pain she can use advil or tylenol and if by all means her leg gets worse over the weekend she will need to go to the ER for eval.  thanks Randell Loop [facility]  April 12, 2010 4:40 PM     Additional Follow-up for Phone Call Additional follow up Details #2::    Called, spoke with pt.  She was informed of above statement per SN.  She verbalized understanding and will go to ED if sxs worsen but in the meantime OV scheduled with TP for 04/15/10 at 11:45am.  Pt aware. Follow-up by: Gweneth Dimitri RN,  April 12, 2010 5:02 PM

## 2010-04-23 NOTE — Assessment & Plan Note (Signed)
Summary: Acute NP office visit - left leg pain   Primary Provider/Referring Provider:  Alroy Dust, MD  CC:  sporadic heaviness, pain in left lower leg that radiates up into the thigh and groin area.  denies heat, swelling, and redness.Paige Pena  History of Present Illness: 70 yo female with known hx of anxiety and GERD  April 15, 2010 --Presents for a work in visit. Complains of  pain and pulling sensation along lower leg above foot where she had foot fxw/ subsequent surgery w/ "screws "placed.Pain aches at times and radiates up leg all the way to thigh. Comes and goes. No otc used. Worse after standing alot.  No calf pain or calf tenderness,no recent travel. Denies chest pain, dyspnea, orthopnea, hemoptysis, fever, n/v/d, edema, headache,.  Medications Prior to Update: 1)  Ecotrin Low Strength 81 Mg Tbec (Aspirin) .... Take 1 Tab By Mouth Once Daily.Paige KitchenMarland Pena 2)  Omeprazole 40 Mg Cpdr (Omeprazole) .... Take 1 Tab By Mouth Once Daily - 30 Min Before A Meal... 3)  Domperidone 10mg  .... Take 1 Tab By Mouth Two Times A Day As Directed By Drdarling... 4)  Detrol La 4 Mg Xr24h-Cap (Tolterodine Tartrate) .... Take 1 Cap By Mouth At Bedtime.Paige KitchenMarland Pena 5)  Vitamin D (Ergocalciferol) 50000 Unit Caps (Ergocalciferol) .... Take One Cap By Mouth Each Week... 6)  Alprazolam 0.5 Mg Tabs (Alprazolam) .... Take 1/2 To 1 Tab By Mouth At Bedtime 7)  Lexapro 10 Mg Tabs (Escitalopram Oxalate) .... Take 1 Tab By Mouth Once Daily As Directed By Drcousins.Paige KitchenMarland Pena 8)  Remeron 15 Mg Tabs (Mirtazapine) .... Take 1 Tab By Mouth At Bedtime...  Current Medications (verified): 1)  Ecotrin 325 Mg Tbec (Aspirin) .... Take 1 Tablet By Mouth Once A Day 2)  Omeprazole 40 Mg Cpdr (Omeprazole) .... Take 1 Tab By Mouth Once Daily - 30 Min Before A Meal... 3)  Domperidone 10mg  .... Take 1 Tab By Mouth Once A Day As Directed By Drdarling... 4)  Detrol La 4 Mg Xr24h-Cap (Tolterodine Tartrate) .... Take 1 Cap By Mouth At Bedtime.Paige KitchenMarland Pena 5)  Vitamin D  (Ergocalciferol) 50000 Unit Caps (Ergocalciferol) .... Take One Cap By Mouth Each Week... 6)  Alprazolam 0.5 Mg Tabs (Alprazolam) .... Take 1/2 To 1 Tab By Mouth At Bedtime 7)  Lexapro 10 Mg Tabs (Escitalopram Oxalate) .... Take 1 Tab By Mouth Once Daily As Directed By Drcousins.Paige KitchenMarland Pena 8)  Remeron 15 Mg Tabs (Mirtazapine) .... Take 1 Tab By Mouth At Bedtime...  Allergies (verified): 1)  ! Sulfa 2)  ! Reglan 3)  ! Morphine 4)  ! Prednisone 5)  Penicillin  Past History:  Past Medical History: Last updated: 01/15/2010 Hx of DYSPHONIA (ICD-784.49) R/O SLEEP APNEA (ICD-780.57) Hx of ASTHMATIC BRONCHITIS, ACUTE (ICD-466.0) Hx of CHEST PAIN, ATYPICAL (ICD-786.59) CEREBROVASCULAR DISEASE (ICD-437.9) HYPERCHOLESTEROLEMIA (ICD-272.0) GERD (ICD-530.81) ACUTE CYSTITIS (ICD-595.0) ESOPHAGEAL STRICTURE (ICD-530.3) GASTROPARESIS (ICD-536.3) DIVERTICULOSIS OF COLON (ICD-562.10) IRRITABLE BOWEL SYNDROME (ICD-564.1) HEMORRHOIDS (ICD-455.6) URINARY INCONTINENCE (ICD-788.30) HEMATURIA, MICROSCOPIC, HX OF (ICD-V13.09) DEGENERATIVE JOINT DISEASE (ICD-715.90) FIBROMYALGIA (ICD-729.1) ANXIETY (ICD-300.00) VITAMIN D DEFICIENCY (ICD-268.9)  Past Surgical History: Last updated: 01/15/2010 S/P hysterectomy in 1985 S/P corneal transplant for keratoconus S/P right bunion surgery S/P left foot fracture w/ ORIF 6/06 by DrDuda  Family History: Last updated: 08/02/2007 Family History of Breast Cancer: aunt No FH of Colon Cancer: Family History of Diabetes: aunt brother uncle Family History of Kidney Disease:father  Social History: Last updated: 08/02/2007 Patient has never smoked.  Alcohol Use - no Illicit Drug Use - no Patient does  not get regular exercise.   Risk Factors: Smoking Status: never (01/15/2010)  Review of Systems      See HPI  Vital Signs:  Patient profile:   70 year old female Height:      61 inches Weight:      142.31 pounds BMI:     26.99 O2 Sat:      97 % on Room  air Temp:     97.7 degrees F oral Pulse rate:   71 / minute BP sitting:   114 / 70  (left arm) Cuff size:   regular  Vitals Entered By: Boone Master CNA/MA (April 15, 2010 12:33 PM)  O2 Flow:  Room air CC: sporadic heaviness, pain in left lower leg that radiates up into the thigh and groin area.  denies heat, swelling, redness. Is Patient Diabetic? No Comments Medications reviewed with patient /Daytime contact number verified with patient. Boone Master CNA/MA  April 15, 2010 12:33 PM    Physical Exam  Additional Exam:  WD, WN, 70 y/o BF in NAD... GENERAL:  Alert & oriented; pleasant & cooperative. HEENT:  [place]/AT, , EACs-clear, TMs-wnl, NOSE-clear, THROAT-clear & wnl. NECK:  Supple w/ fairROM; no JVD; normal carotid impulses w/o bruits; no thyromegaly or nodules palpated; no lymphadenopathy, CHEST:  Clear to P & A; without wheezes/ rales/ or rhonchi. HEART:  Regular Rhythm; without murmurs/ rubs/ or gallops. ABDOMEN:  Soft & nontender; normal bowel sounds; no organomegaly or masses detected, no guarding or rebound noted. . EXT: without deformities, mild arthritic changes; no varicose veins/ venous insuffic/ or edema. nontender left lower leg, ankle. surgical scar well healed on dorsal aspect of mid left foot.neg homans sign, rom of ankle and food nml,nml gait,equal strength bilaterally of lower ext. NEURO:   intact w/ no focal deficits noted.  DERM:  clear, no rash noted.      Impression & Recommendations:  Problem # 1:  DEGENERATIVE JOINT DISEASE (ICD-715.90)  Left lower leg pain ?arthritic in natuer  Plan: Aleve two times a day with food for 7 days Alternate ice and heat to leg/foot Elevate and rest as needed  Please contact office for sooner follow up if symptoms do not improve or worsen  follow up Dr. Kriste Basque in3-32months and prn of not improving consider ortho referral  Her updated medication list for this problem includes:    Ecotrin 325 Mg Tbec (Aspirin) .Paige Pena...  Take 1 tablet by mouth once a day  Orders: Est. Patient Level III ([address])  Medications Added to Medication List This Visit: 1)  Ecotrin 325 Mg Tbec (Aspirin) .... Take 1 tablet by mouth once a day 2)  Domperidone 10mg   .... Take 1 tab by mouth once a day as directed by drdarling...  Patient Instructions: 1)  Aleve two times a day with food for 7 days 2)  Alternate ice and heat to leg/foot 3)  Elevate and rest as needed  4)  Please contact office for sooner follow up if symptoms do not improve or worsen  5)  follow up Dr. Kriste Basque in3-45months and prn

## 2010-05-29 ENCOUNTER — Other Ambulatory Visit: Payer: Self-pay | Admitting: Pulmonary Disease

## 2010-06-25 ENCOUNTER — Other Ambulatory Visit: Payer: Self-pay | Admitting: Neurology

## 2010-06-25 DIAGNOSIS — S060X9A Concussion with loss of consciousness of unspecified duration, initial encounter: Secondary | ICD-10-CM

## 2010-06-25 DIAGNOSIS — R4701 Aphasia: Secondary | ICD-10-CM

## 2010-06-25 DIAGNOSIS — R413 Other amnesia: Secondary | ICD-10-CM

## 2010-06-25 DIAGNOSIS — S060XAA Concussion with loss of consciousness status unknown, initial encounter: Secondary | ICD-10-CM

## 2010-06-28 ENCOUNTER — Ambulatory Visit
Admission: RE | Admit: 2010-06-28 | Discharge: 2010-06-28 | Disposition: A | Discharge status: Home / Self Care | Payer: Medicare Other | Source: Ambulatory Visit | Attending: Neurology | Admitting: Neurology

## 2010-06-28 DIAGNOSIS — R4701 Aphasia: Secondary | ICD-10-CM

## 2010-06-28 DIAGNOSIS — S060XAA Concussion with loss of consciousness status unknown, initial encounter: Secondary | ICD-10-CM

## 2010-06-28 DIAGNOSIS — R413 Other amnesia: Secondary | ICD-10-CM

## 2010-06-28 DIAGNOSIS — S060X9A Concussion with loss of consciousness of unspecified duration, initial encounter: Secondary | ICD-10-CM

## 2010-07-03 ENCOUNTER — Other Ambulatory Visit: Payer: Self-pay | Admitting: Pulmonary Disease

## 2010-07-04 ENCOUNTER — Telehealth: Payer: Self-pay | Admitting: Pulmonary Disease

## 2010-07-04 DIAGNOSIS — M79605 Pain in left leg: Secondary | ICD-10-CM

## 2010-07-04 NOTE — Telephone Encounter (Signed)
Pt c/o increased pain and discomfort in her left leg. Worse x 1 week. She has a pending appt with SN on 07/15/2010 but is requesting either a sooner appt or be scheduled for venous doppler to check for PE. Pls advise. Allergies  Allergen Reactions  . Metoclopramide Hcl     REACTION: anxious and out of her mind  . Morphine     REACTION: lethargy/malaise  . Penicillins   . Prednisone     REACTION: rash/hives  . Sulfonamide Derivatives     REACTION: itching

## 2010-07-04 NOTE — Telephone Encounter (Signed)
Called and spoke with pt and she is requesting that we schedule a venous doppler for her left leg pain with some swelling per pt.  Explained to the pt that per SN ok to schedule this.  Pt stated that Dr. Anne Hahn called her on Tuesday and told her the scan she had done was unremarkable.

## 2010-07-09 NOTE — Letter (Signed)
January 12, 2007    C. Lesia Sago, M.D.  [address]  [address]  [place], [place] [address]   RE:  CARMELLE, BAMBERG  MRN:  [id number]  /  DOB:  1940/03/21   Dear Mellody Dance:   [name] is a 70 year old black female whom I follow for  general medical purposes.  She has a history of fibromyalgia and  multiple somatic complaints.  She was in recently and requested a  neurological consultation to make sure that I have not had any  strokes.  As noted, she has a history of moderate anxiety,  fibromyalgia, and multiple somatic complaints.  She had a sleep study  earlier this year that was only minimally abnormal with a respiratory  disturbance index of 6 and they did note some restless leg symptoms.  She has tried ReQuip for this.  She has history of reflux esophagitis,  irritable bowel syndrome and some gastroparesis.  She takes Zegerid 40  mg p.o. q.h.s. and Reglan 10 mg p.o. q.i.d.  Her gastroenterologist is  Dr. Russella Dar.   During her recent evaluation, her blood pressure was normal at 134/70,  pulse 74 per minute and regular, respirations 18 per minute and not  labored.  HEENT exam was unremarkable.  Chest exam was clear to  percussion and auscultation.  Cardiac exam revealed a regular rhythm,  normal S1 and S2, without murmurs, rubs, or gallops detected.  Her  abdomen was soft and nontender without evidence of organomegaly or  masses.  Her extremities showed some mild degenerative arthritis but no  cyanosis, clubbing or edema.  Her neurologic exam was intact without  focal abnormalities on my testing.   As we finished the evaluation she told me she wanted to have a repeat  cardiac evaluation with a Cardiolite and a neurological evaluation to  rule out stroke.  I told her we would do what we could to accommodate  her requests.   Thank you very much for your evaluation.    Sincerely,      Lonzo Cloud. Kriste Basque, MD  Electronically Signed    SMN/MedQ  DD: 01/12/2007  DT:  01/13/2007  Job #: [id number]

## 2010-07-09 NOTE — Assessment & Plan Note (Signed)
[facility]                         GASTROENTEROLOGY OFFICE NOTE   LAI, HENDRIKS                        MRN:          [id number]  DATE:12/07/2006                            DOB:          Feb 03, 1941    Paige Pena complains of persistent problems with belching, nausea,  and epigastric pain.  She relates intermittent rectal pain and pressure  over the past few weeks.  She has persistent problems with alternating  diarrhea and constipation, and she has not been following a high fiber  diet although she notes that is usually helps her IBS symptoms.  She has  noted a raspy voice with occasional hoarseness.  She has seen an ENT  physician previously but does not recall the physician's name.  She has  had some improvement in symptoms on Nexium b.i.d. but her symptoms  persist.  She underwent endoscopy in July 2007, which showed a distal  esophageal stricture.  Biopsies showed mild inflammation consistent with  GERD and no other abnormalities.  She is quite anxious about colon  polyps, colon cancer, and undiagnosed conditions involving the  gastrointestinal tract.  She underwent colonoscopy in September 2006,  which showed sigmoid colon diverticulosis with marked spasm which  limited the exam in this area.  The remainder of the exam was  unremarkable except for internal hemorrhoids.  An 8-year followup  colonoscopy was recommended due to the sigmoid colon spasm.  She has no  dysphagia, odynophagia, weight loss, change in bowel habits, melena, or  hematochezia.   CURRENT MEDICATIONS:  Listed on the chart, updated and reviewed.   MEDICATIONS ALLERGIES:  1. SULFA DRUGS.  2. PENICILLIN.   PHYSICAL EXAMINATION:  GENERAL:  No acute distress.  VITAL SIGNS:  Weight 127 pounds, down 7.4 pounds since January.  Blood  pressure is 118/78, pulse 72 and regular.  CHEST:  Clear to auscultation bilaterally.  CARDIAC:  Regular rate and rhythm without murmurs.  ABDOMEN:  Soft and nontender with normoactive bowel sounds.  No palpable  organomegaly, masses, or hernias.  RECTAL:  Reveals no lesions or tenderness.  Hemoccult negative brown  stool in the rectal vault.   ASSESSMENT/PLAN:  1. Refractory gastroesophageal reflux disease rule out LPR, rule out      gastroparesis.  Change to Zegerid 40 mg by mouth twice a day.  Re-      intensify all antireflux measures with the use of 4 inch bed      blocks.  Schedule a gastric emptying scan.  She is to return to her      ear, nose, and throat physician for further evaluation of her vocal      complaints.  2. Irritable bowel syndrome.  She is to substantially increase her      fluid and fiber intake.  Begin Benefiber once daily.  Begin      Florastor once daily for 3 months.  Use hyoscyamine twice a day as      needed.  Return office visit 8 weeks.  3. Anxiety.  This may be contributing to her irritable bowel syndrome.  She is ruminating about      possible gastrointestinal diseases that we have already evaluated      for in the recent past.  I will have her return to see Dr. Kriste Basque      for further management of anxiety.     Paige Pena. Russella Dar, MD, [facility]  Electronically Signed    MTS/MedQ  DD: 12/07/2006  DT: 12/07/2006  Job #: [id number]   cc:   Lonzo Cloud. Kriste Basque, MD

## 2010-07-11 ENCOUNTER — Encounter (INDEPENDENT_AMBULATORY_CARE_PROVIDER_SITE_OTHER): Payer: Medicare Other | Admitting: Cardiology

## 2010-07-11 DIAGNOSIS — M7989 Other specified soft tissue disorders: Secondary | ICD-10-CM

## 2010-07-11 DIAGNOSIS — M79605 Pain in left leg: Secondary | ICD-10-CM

## 2010-07-11 DIAGNOSIS — M79609 Pain in unspecified limb: Secondary | ICD-10-CM

## 2010-07-12 NOTE — Op Note (Signed)
[name], Paige Pena               ACCOUNT NO.:  [id number]   MEDICAL RECORD NO.:  [id number]          PATIENT TYPE:  OIB   LOCATION:  5735                         FACILITY:  [facility]   PHYSICIAN:  Nadara Mustard, MD     DATE OF BIRTH:  [birth year]   DATE OF PROCEDURE:  08/23/2004  DATE OF DISCHARGE:                                 OPERATIVE REPORT   PREOPERATIVE DIAGNOSIS:  Left foot Lisfranc fracture-dislocation.   POSTOPERATIVE DIAGNOSIS:  Left foot Lisfranc fracture-dislocation.   PROCEDURES:  Open reduction and internal fixation of left Lisfranc fracture-  dislocation.   SURGEON:  Nadara Mustard, M.D.   ANESTHESIA:  Popliteal block.   ESTIMATED BLOOD LOSS:  Minimal.   ANTIBIOTICS:  Kefzol 1 g.   TOURNIQUET TIME:  Esmarch ankle for approximately 35 minutes.   DISPOSITION:  To PACU in stable condition.   INDICATIONS FOR PROCEDURE:  The patient is a 70 year old woman with a  Lisfranc fracture-dislocation to her left foot. She has failed conservative  care with immobilization and presents at this time for open reduction and  internal fixation. The risks and benefits were discussed, including  infection, neurovascular injury, persistent pain and need for additional  surgery. The patient states she understands and wished proceed at this time.   DESCRIPTION OF PROCEDURE:  The patient was brought to OR room 1 after  undergoing a popliteal block. After an adequate level of anesthesia  obtained, the patient's left lower extremity was prepped using DuraPrep and  draped into a sterile field. An incision was made dorsally centered over the  Lisfranc's joint. Blunt dissection was carried down. The neurovascular  bundle was retracted laterally. The EHL tendon was retracted medially.  Dissection was carried down to the first metatarsal medial cuneiform joint.  The joint was completely disrupted with instability at the first metatarsal  medial cuneiform joint. This was then stabilized  with a screw from dorsal  distal over the first metatarsal to plantar proximal into the medial  cuneiform. A second screw was then placed proximal dorsal from the medial  cuneiform and plantar distal into the first metatarsal. A third screw was  then placed from the medial cuneiform across the Lisfranc joint into the  second metatarsal and a second screw was placed from the second metatarsal  into the middle cuneiform. Radiographs showed reduction of the Lisfranc's  joint. The mid foot was stable. There was no instability after reduction.  The wound was irrigated with normal saline. The Esmarch was  released after 35 minutes. Hemostasis was obtained. The incision was closed  using a far-near near-far suture with 2-0 nylon. The wound was covered with  Adaptic, orthopedic sponges, sterile Webril and a Coban dressing. The  patient was then taken to the PACU in stable condition. Plan for 23-hour  observation and discharge to home in the morning.       MVD/MEDQ  D:  08/23/2004  T:  08/23/2004  Job:  [id number]

## 2010-07-12 NOTE — Assessment & Plan Note (Signed)
[facility]                           GASTROENTEROLOGY OFFICE NOTE   RHONNA, HOLSTER                        MRN:          [id number]  DATE:09/02/2005                            DOB:          28-Nov-1940    Mrs. Nippert is a 70 year old African-American female whom I have  previously evaluated.  She relates new complaints of nausea with epigastric  discomfort and solid food dysphagia for the past 2 months.  She has noted  gas, bloating and belching that has been more bothersome for the past 2  months, as well.  She carries a diagnosis of irritable bowel syndrome and  tends to have loose watery stools. These symptoms have not changed.  Her  appetite has decreased.  She notes no weight loss, hematemesis, melena,  hematochezia or change in bowel habits.  She has been maintained on Nexium  daily for GERD.  A colonoscopy was performed in September of 2006, which  showed sigmoid colon diverticulosis, cecal and ascending colon  diverticulosis and internal hemorrhoids.   FAMILY HISTORY:  Negative for colon cancer, colon polyps or inflammatory  bowel disease.   PAST MEDICAL HISTORY:  1.  Asthma.  2.  Arthritis.  3.  Sinusitis.  4.  Sleep apnea.  5.  Diverticulosis.  6.  Hemorrhoids.  7.  Status post angioplasty.  8.  Status post hysterectomy.  9.  Status post tubal ligation.   MEDICATIONS:  Medications listed on the chart; updated and reviewed.   MEDICATION ALLERGIES:  SULFA DRUGS.   SOCIAL HISTORY:  Per handwritten form.   REVIEW OF SYSTEMS:  Entirely negative per handwritten form.   PHYSICAL EXAMINATION:  GENERAL:  In no acute distress.  VITAL SIGNS:  Height 5 feet, 1 inch, weight 140.6 pounds, blood pressure  128/66, pulse 98 and regular.  HEENT:  Anicteric sclerae.  Oropharynx clear.  CHEST:  Clear to auscultation bilaterally.  CARDIAC:  Regular rate and rhythm without murmurs appreciated.  ABDOMEN:  Soft, nontender, nondistended.   Normoactive bowel sounds.  No  palpable organomegaly, masses or hernias.  RECTAL:  Deferred.  NEUROLOGIC:  Alert and oriented x3.  Grossly non-focal.   ASSESSMENT AND PLAN:  Worsening GERD with new onset solid food dysphagia.  Increase Nexium to 40 mg p.o. b.i.d., taken one-half hour before breakfast  and dinner.  Re-intensify all antireflux measures and begin the use of four-  inch bed blocks.  Risks, benefits, and alternatives to upper endoscopy with  possible biopsy and possible dilation discussed with the patient, and she  consents to proceed.  This will be scheduled electively.                                   Venita Lick. Pleas Koch., MD, [facility]   MTS/MedQ  DD:  09/21/2005  DT:  09/21/2005  Job #:  [id number]

## 2010-07-12 NOTE — Procedures (Signed)
NAME:  Paige Pena, Paige Pena               ACCOUNT NO.:  [id number]   MEDICAL RECORD NO.:  [id number]          PATIENT TYPE:  OUT   LOCATION:  SLEEP CENTER                 [facility]ACILITY[facility]   PHYSICIAN:  Clinton D. Maple Hudson, MD, FCCP, FACPDATE OF BIRTH:  December 12, 1939   DATE OF STUDY:  06/24/2006                            NOCTURNAL POLYSOMNOGRAM   REFERRING PHYSICIAN:  Pollyann Savoy, M.D.   INDICATION FOR STUDY:  Insomnia with sleep apnea   EPWORTH SLEEPINESS SCORE:  10/24, BMI 22.6, weight 120 pounds.   MEDICATIONS:  Home medications Detrol, Prevacid, hycosamine, __________  .   SLEEP ARCHITECTURE:  Total sleep time 313 minutes with sleep efficiency  87%.  Stage I was 4%, stage II 70%, stages III and IV are absent, REM  26% of total sleep time.  Sleep latency 14 minutes, REM latency 135  minutes, awake after sleep onset 16 minutes arousal index 15.5.  No  bedtime medication was taken.   RESPIRATORY DATA:  Apnea-hypopnea index (AHI, RDI) 5.9 obstructive  events per hour, indicating very mild obstructive sleep apnea/hypopnea  syndrome.  This included 6 central apneas, 15 obstructive apneas, 1  mixed apnea and 9 hypopneas.  All events were recorded while sleeping  supine and she spent most of the night in supine position.  REM AHI 2.9  per hour.  Diagnostic NPSG protocol was requested and followed.   OXYGEN DATA:  Mild to moderate snoring with oxygen desaturation to a  nadir of 84%.  Mean oxygen saturation through the study was 95% on room  air.   CARDIAC DATA:  Sinus rhythm with occasional PVC.   MOVEMENT-PARASOMNIA:  A total of 65 limb jerks were recorded, of which  43 were associated with arousal or awakening, for a periodic limb  movement with arousal index of 8.2 per hour which is increased.   IMPRESSIONS-RECOMMENDATIONS:  1. Unremarkable sleep architecture, total sleep time a little shorter      than average for an adult.  Absent slow wave sleep stages III/IV is      considered  within normal for an adult.  2. Minimal obstructive sleep apnea/hypopnea syndrome, apnea-hypopnea      index 5.9 per hour with central and obstructive events associated      with supine sleep position (normal range 0 to 5 per hour).  Scores      in this range are not usually considered for continuous positive      airway pressure therapy unless clinical circumstances indicate      otherwise.  Consider encouragement to try sleeping off of flat of      back.  Mild snoring was associated with transient desaturation to      84%, but otherwise well maintained oxygen saturation through the      study.  3. Periodic limb movement syndrome with arousal, 8.2 per hour.  This      frequency of sleep disruption associated      with limb jerks may justify a therapeutic trial such as ReQuip,      Mirapex or clonazepam if clinically appropriate.      Clinton D. Maple Hudson, MD, FCCP, FACP  Diplomate, American  Board of Sleep Medicine  Electronically Signed     CDY/MEDQ  D:  06/28/2006 10:34:45  T:  06/28/2006 17:04:45  Job:  [id number]

## 2010-07-12 NOTE — Assessment & Plan Note (Signed)
[facility]                         GASTROENTEROLOGY OFFICE NOTE   Paige Pena, Paige Pena                        MRN:          [id number]  DATE:03/02/2006                            DOB:          1940-07-26    Paige Pena returns for followup of epigastric pain, nausea, and  intermittent diarrhea.  Her symptoms began around 01/25/06 and she was in  [place] at the time and was seen in an emergency room on December 5.  Her Nexium was increased to twice a day, as it was felt that she was  experiencing increased reflux symptoms.  She presented to an urgent care  center on February 03, 2006.  She described diarrhea, epigastric  cramping, and bloating.  She was felt to have irritable bowel syndrome  and was placed on Librax, which did substantially improve her symptoms.  She was changed to Prevacid daily and her symptoms have generally been  under better control.  She only used the Librax for about 5 days.  She  has frequent belching in addition to the intermittent diarrhea,  epigastric pain, and nausea.  She notes no vomiting, melena,  hematochezia, weight loss, dysphagia, or odynophagia.  She underwent  upper endoscopy in July of 2007 for reflux symptoms and dysphagia.  A  distal esophageal stricture was noted and she was dilated to 17 mm.   CURRENT MEDICATIONS:  Listed on the chart - updated and reviewed.   MEDICATION ALLERGIES:  SULFA and PENICILLIN.   EXAM:  No acute distress.  Weight 134.4 pounds, blood pressure 118/78, pulse 72 and regular.  HEENT:  Anicteric sclerae.  Oropharynx clear.  CHEST:  Clear to auscultation bilaterally.  CARDIAC:  Regular rate and rhythm without murmur.  ABDOMEN:  Soft with minimal epigastric tenderness to deep palpation.  No  rebound or guarding.  No palpable organomegaly, masses, or hernias.  Normoactive bowel sounds.   ASSESSMENT AND PLAN:  1. Gastroesophageal reflux disease with a prior history of a peptic  stricture.  She is to re-intensify all anti-reflux measures and      maintain Prevacid 30 mg daily.  If she has persistent reflux      symptoms, we will increase Prevacid to b.i.d.  2. Probable irritable bowel syndrome.  Maintain a high fiber diet with      adequate fluid intake.  Levbid 1 p.o. b.i.d. p.r.n.  3. Return office visit in 6 months.     Paige Pena. Russella Dar, MD, [facility]  Electronically Signed    MTS/MedQ  DD: 03/09/2006  DT: 03/09/2006  Job #: [id number]

## 2010-07-15 ENCOUNTER — Ambulatory Visit (INDEPENDENT_AMBULATORY_CARE_PROVIDER_SITE_OTHER): Payer: Medicare Other | Admitting: Pulmonary Disease

## 2010-07-15 ENCOUNTER — Encounter: Payer: Self-pay | Admitting: Pulmonary Disease

## 2010-07-15 DIAGNOSIS — M1990 Unspecified osteoarthritis, unspecified site: Secondary | ICD-10-CM

## 2010-07-15 DIAGNOSIS — E7800 Pure hypercholesterolemia, unspecified: Secondary | ICD-10-CM

## 2010-07-15 DIAGNOSIS — K219 Gastro-esophageal reflux disease without esophagitis: Secondary | ICD-10-CM

## 2010-07-15 DIAGNOSIS — K589 Irritable bowel syndrome without diarrhea: Secondary | ICD-10-CM

## 2010-07-15 DIAGNOSIS — F411 Generalized anxiety disorder: Secondary | ICD-10-CM

## 2010-07-15 DIAGNOSIS — R498 Other voice and resonance disorders: Secondary | ICD-10-CM

## 2010-07-15 DIAGNOSIS — R32 Unspecified urinary incontinence: Secondary | ICD-10-CM

## 2010-07-15 DIAGNOSIS — K5730 Diverticulosis of large intestine without perforation or abscess without bleeding: Secondary | ICD-10-CM

## 2010-07-15 DIAGNOSIS — I679 Cerebrovascular disease, unspecified: Secondary | ICD-10-CM

## 2010-07-15 DIAGNOSIS — K3184 Gastroparesis: Secondary | ICD-10-CM

## 2010-07-15 DIAGNOSIS — E559 Vitamin D deficiency, unspecified: Secondary | ICD-10-CM

## 2010-07-15 DIAGNOSIS — IMO0001 Reserved for inherently not codable concepts without codable children: Secondary | ICD-10-CM

## 2010-07-15 MED ORDER — ERGOCALCIFEROL 1.25 MG (50000 UT) PO CAPS: 50000.0000 [IU] | ORAL_CAPSULE | ORAL | Status: DC

## 2010-07-15 MED ORDER — OMEPRAZOLE 40 MG PO CPDR: 40.0000 mg | DELAYED_RELEASE_CAPSULE | Freq: Every day | ORAL | Status: DC

## 2010-07-15 NOTE — Progress Notes (Signed)
Subjective:    Patient ID: Paige Pena, female    DOB: 12/30/1940, 70 y.o.   MRN: [id number]  HPI 70 y/o BF here for a follow up visit... she has multiple medical problems as noted below...    ~  May 23, 2009:  she has had GI evals from DrStark, and DrOrman at [facility] w/ IBS, bloating, delayed gastric emptying, globus sensation, fecal incontinence (their notes are reviewed w/ pt)> last note DrOrman 11/10- f/u mult GI complaints; they tried biofeedback, Zegerid, Domperidone 10mg Bid, Remeron Qhs, & ENT referral (Voice therapy)...  she switched the Zegerid to Omep40mg  for $$ reasons; asks that we send copy of colonoscopy to DrOrman; recently seen by GYN= DrCousins and started on LEXAPRO 10mg /d... she is quite anxious & not using her Alprazolam regularly & we discussed this...  ~  January 15, 2010:  she returns w/ mult somatic complaints> HA in left temple area, SOB- can't get a DB, pressure in abd, occas ankle pain, etc... she has seen DrDarling & Orman at [facility] w/ Dx of IBS, delayed gastric emptying, nausea, bloating, fecal incontinence- they wanted her to continue Remeron for sleep, Domperidone for delayed gastric emptying, & gave her a course of Rifaximin for the bloating... she also saw DrBuckmire [facility] for hoarseness w/ dysphonia, vocal fold weakness, & an epithelial lesion that was biopsied & benign... she also saw DrWillis for Neuro 7/11 w/ c/o memory problems & he did labs/ MRI Brain/ EEG> reported to be unremarkable...  ~  Jul 15, 2010:  3mo ROV & she continues to have mult complaints that seem revolve around 3 systems> GI, Neuro, & Rheum/Ortho:  GI problems as noted above w/ eval & rx from [facility] w/ referral to ENT as well w/ eval DrBuckmire & she says they saw something in her throat & are planning a CT scan for further eval;  Neuro eval DrWillis for memory & ?transient aphasia (exam was neg & rec to take ASA 81mg /d & given Axona dietary product);  She has seen DrDeveshwar in the past for  prob FM, OA, & DDD in lumbar spine> labs today showed elev sed rate (61) ?etiology & she is requested to f/u w/ Rheum for further investigation...  See prob list below>>          Problem List:    PHYSICAL EXAMINATION (ICD-V70.0) - GYN= DrCousins w/ PAP Q66yrs, seen 3/11 & started on Lexapro...  Mammograms at [facility]- done in 2009 neg x cysts... BMD at [facility] ~41yrs ago per pt (we only have copy of BMD from 2002= WNL w/ pos TScores)...  Hx of DYSPHONIA (WUJ-811.91) - She has seen ENT- DrTeoh on several occas w/ cerumen impactions, sensorineural hearing loss, dysphonia & globus phenomenon (the latter felt secondary to GERD)... laryngoscopy was neg... she was maintained on Zegerid 40mg  Bid & switched to OMEPRAZOLE 40mg /d for $$ reasons... ~  2010:  referred by [facility] to their ENT Dept for voice therapy w/ eval by DrBuckmire- dysphonia & vocal fold weakness, plus an epithelial lesion that was biopsied & benign... ~  2012:  Further eval by [facility] GI & ENT depts (we don't have notes from them); she reports that DrBuckmire plans CT scan of neck...  R/O SLEEP APNEA (ICD-780.57) - sleep study 4/08 showed RDI= 6 & some restless leg symptoms (tried Requip)... DrDeveshwar has tried Provigil for Idiopathic Hypersomnolence...  Hx of ASTHMATIC BRONCHITIS, ACUTE (ICD-466.0) - no recent URIs reported...  Hx of CHEST PAIN, ATYPICAL (ICD-786.59)  ~  cardiac cath 1997 showed normal coronaries ~  2DEcho 9/03 showed mild conc LVH, norm EF, mild AoV sclerosis, mild thickening MV leaflets, mild MR... ~  NuclearStressTest 11/08 was WNL- no ischemia, no infarct, EF= 79%...  CEREBROVASCULAR DISEASE (ICD-437.9) - on ASA 325mg /d... she was eval by DrStiefel for neurology yrs ago... ~  MRI Brain 6/06 showed remote lacunar infarcts in the cerebellum bilaterally, chr small vessel dis, NAD... ~  2011:  Neuro eval by DrWillis for memory disturbance & report of transient aphasia> we don't have the scan report but his note  indicates MRI relatively unremarkable & EEG was WNL.  HYPERCHOLESTEROLEMIA (ICD-272.0)0 - on diet alone... ~  FLP 11/08 showed TChol 212, TG 57, HDL 68, LDL 132 ~  FLP 1/10 showed TChol 207, TG 62, HDL 64, LDL 127... she prefers to continue diet management. ~  FLP 3/11 showed TChol 238, TG 61, HDL 71, LDL 154... she is reluctant to consider Statin Rx due to side effects. ~  FLP 11/11 showed TChol 221, TG 54, HDL 66, LDL 129 ~  FLP 5/12 showed TChol 171, TG 56, HDL 59, LDL 101... rec to continue diet & exercise program...  GERD (ICD-530.81), & ESOPHAGEAL STRICTURE (ICD-530.3) - on OMEPRAZOLE 40mg /d now + off prev Carafate Rx... ~  EGD 7/07 by DrStark showed GERD & stricture- dilated... ~  pt seen by GI/ DrStark Jun09- outlined plans for Rx w/ Zegerid, Domperidone, Carafate... ~  2010: she had 2nd opinion consult [facility] DrOrman & Piedad Climes- mult GI complaints w/ IBS, delayed gastric emptying, hx dysphagia, globus sensation, & fecal incontinence... treated w/ PPI, Dromperidone, Remeron, & Voice therapy w/ referral to ENT Dept. ~  2011:  Further eval & testing at [facility] (we don't have their records)...  GASTROPARESIS (ICD-536.3) - tried REGLAN- discontinued due to reaction w/ anxiety... DrStark added DOMPERIDONE Prn... ~  Gastric Emptying Scan 10/08 showed 93% activity remaining in the stomach at 60 min, and 75% remaining at 2H (normal is <30% at [facility])...  DIVERTICULOSIS OF COLON (ICD-562.10),  IRRITABLE BOWEL SYNDROME (ICD-564.1), & HEMORRHOIDS (ICD-455.6) -  ~  CT Abd & Pelvis in 2001 was WNL... ~  colonoscopy 9/06 by DrStark showed divertics and int hems... she had spasm in the sigmoid region.  URINARY INCONTINENCE (ICD-788.30) - on DETROL LA 4mg /d...  HEMATURIA, MICROSCOPIC, HX OF (ICD-V13.09)  DEGENERATIVE JOINT DISEASE (ICD-715.90) - she has some DDD in lumbar spine and osteoarthritis in knees... ~  5/12:  She called w/ leg discomfort & some subjective swelling on left side> VenDopplers were  neg/ normal bilaterally...  FIBROMYALGIA (ICD-729.1) - followed by DrDeveshwar for Rheum... c/o insomnia, fatigue, fogginess, and mult somatic complaints "I stay tired"...... tried Ambien, Provigil, etc... ~  5/12:  She has mult somatic complaints; routine lab showed Sed rate= 61 ?etiology & she is referred back to [facility] for further eval...  ANXIETY (ICD-300.00) - prev tried on ALPRAZOLAM 0.5mg - 1/2 to 1 tab three times a day as needed... DrDarling at [facility] rec REMERON 15mg  for sleep... DrCousins, GYN Rx w/ LEXAPRO 10mg /d...  VITAMIN D DEFICIENCY (ICD-268.9) - on Vit D 50K weekly... ~  Vit D level 1/10 = 15 (30-90) therefore started on Vit D 50,000 u weekly... ~  labs 3/11 showed Vit D level = 39 ~  labs 11/11 showed Vit D level = 59... continue 50K weekly at her request..   Past Surgical History  Procedure Date  . Abdominal hysterectomy 1995  . Corneal transplant for keratonconus   . Right  bunion surgery   . Left foot fracture with orif 07/2004    Dr. Lajoyce Corners    Outpatient Encounter Prescriptions as of 07/15/2010  Medication Sig Dispense Refill  . ALPRAZolam (XANAX) 0.5 MG tablet Take 1/2 to 1 tablet by mouth at bedtime       . aspirin (ECOTRIN) 325 MG EC tablet Take 325 mg by mouth daily.        . ergocalciferol (VITAMIN D2) 50000 UNITS capsule Take 50,000 Units by mouth once a week.        . escitalopram (LEXAPRO) 10 MG tablet Take 10 mg by mouth daily. As directed by Dr. Cherly Hensen       . omeprazole (PRILOSEC) 40 MG capsule TAKE ONE CAPSULE BY MOUTH ONCE DAILY 30 MINUTES BEFORE A MEAL  30 capsule  0  . tolterodine (DETROL LA) 4 MG 24 hr capsule Take 4 mg by mouth at bedtime.        . mirtazapine (REMERON) 15 MG tablet Take 15 mg by mouth at bedtime.          Allergies  Allergen Reactions  . Metoclopramide Hcl     REACTION: anxious and out of her mind  . Morphine     REACTION: lethargy/malaise  . Penicillins     Rash, itching  . Prednisone     REACTION: rash/hives  .  Sulfonamide Derivatives     REACTION: itching    Review of Systems         See HPI - all other systems neg except as noted... The patient complains of hoarseness, dyspnea on exertion, abdominal pain, and muscle weakness.  The patient denies anorexia, fever, weight loss, weight gain, vision loss, decreased hearing, chest pain, syncope, peripheral edema, prolonged cough, headaches, hemoptysis, melena, hematochezia, severe indigestion/heartburn, hematuria, incontinence, suspicious skin lesions, transient blindness, difficulty walking, depression, unusual weight change, abnormal bleeding, enlarged lymph nodes, and angioedema.     Objective:   Physical Exam     WD, WN, 70 y/o BF in NAD, she appears anxious... GENERAL:  Alert & oriented; pleasant & cooperative... HEENT:  [place]/AT, EOM-wnl, PERRLA, EACs-clear, TMs-wnl, NOSE-clear, THROAT-clear & wnl. NECK:  Supple w/ fairROM; no JVD; normal carotid impulses w/o bruits; no thyromegaly or nodules palpated; no lymphadenopathy. CHEST:  Clear to P & A; without wheezes/ rales/ or rhonchi heard... HEART:  Regular Rhythm; without murmurs/ rubs/ or gallops detected... ABDOMEN:  Soft & nontender; normal bowel sounds; no organomegaly or masses palpated... EXT: without deformities, mild arthritic changes; no varicose veins/ venous insuffic/ or edema. NEURO:  CN's intact; motor testing normal; sensory testing normal; gait normal & balance OK. DERM:  no lesions seen...   Assessment & Plan:   DYSPHONIA>  ENT eval at [facility] is on-going & she tells me they plan a CT scan of her neck soon; she is on antireflux regimen & PPI; rec to continue regular f/u w/ ENT & follow their recommendations.  CHOL>  On diet alone & FLP looks good, continue diet + exercise program...  GI>  Extensive GI evals in [place] & [facility] as noted> GERD, Hx stricture, Gastroparesis, Divertics, IBS, Hems> currently only taking Omep40mg /d w/ prev regimen outlined above; she is rec to f/u w/ her  gastroenterologist & follow their recommendations...  RHEUM>  FM/ OA/ DDD lumbar spine, etc>  Followed by DrDeveshwar & due for rov esp in light of elev sed rate= 61 ?source/ etiology...  NEURO>  followed by DrWillis w/ hx memory disturbance, ?transient aphasia, prev MRI  w/ lacunar infarcts in cerebellum & sm vessel dis on ASA daily...  Anxiety & ?depression> on Alprazolam prn & her GYN DrCousins has given her Lexapro for depression.Marland KitchenMarland Kitchen

## 2010-07-15 NOTE — Patient Instructions (Signed)
Today we updated your med list in EPIC...    Continue your current meds the same...  Today we did your follow up fasting blood work...    Please call the PHONE TREE in a few days for your results...    Dial [id number] & when prompted enter your patient number followed by the # symbol...    Your patient number is:  [id number]#  We gave you a low sodium diet to help minimize any swelling in your legs... Your Venous Dopplers did not show any blood clots in your legs & this should reassure you... Continue your follow up appts w/ your doctors at [facility] & w/ DrWillis & the Neurology group...  Let's plan a follow up visit in 6 months.Marland KitchenMarland Kitchen

## 2010-07-16 ENCOUNTER — Telehealth: Payer: Self-pay | Admitting: Pulmonary Disease

## 2010-07-16 ENCOUNTER — Other Ambulatory Visit (INDEPENDENT_AMBULATORY_CARE_PROVIDER_SITE_OTHER): Payer: Medicare Other

## 2010-07-16 DIAGNOSIS — IMO0001 Reserved for inherently not codable concepts without codable children: Secondary | ICD-10-CM

## 2010-07-16 DIAGNOSIS — F411 Generalized anxiety disorder: Secondary | ICD-10-CM

## 2010-07-16 DIAGNOSIS — I679 Cerebrovascular disease, unspecified: Secondary | ICD-10-CM

## 2010-07-16 DIAGNOSIS — E7800 Pure hypercholesterolemia, unspecified: Secondary | ICD-10-CM

## 2010-07-16 DIAGNOSIS — K219 Gastro-esophageal reflux disease without esophagitis: Secondary | ICD-10-CM

## 2010-07-16 LAB — CBC WITH DIFFERENTIAL/PLATELET
Basophils Relative: 1.7 % (ref 0.0–3.0)
Eosinophils Relative: 0.9 % (ref 0.0–5.0)
HCT: 34.4 % — ABNORMAL LOW (ref 36.0–46.0)
Hemoglobin: 11.7 g/dL — ABNORMAL LOW (ref 12.0–15.0)
Lymphs Abs: 1.7 10*3/uL (ref 0.7–4.0)
MCV: 88 fl (ref 78.0–100.0)
Monocytes Absolute: 0.5 10*3/uL (ref 0.1–1.0)
Monocytes Relative: 8.6 % (ref 3.0–12.0)
Platelets: 213 10*3/uL (ref 150.0–400.0)
RBC: 3.92 Mil/uL (ref 3.87–5.11)
WBC: 6 10*3/uL (ref 4.5–10.5)

## 2010-07-16 LAB — HEPATIC FUNCTION PANEL
AST: 17 U/L (ref 0–37)
Albumin: 3.3 g/dL — ABNORMAL LOW (ref 3.5–5.2)
Total Bilirubin: 0.4 mg/dL (ref 0.3–1.2)

## 2010-07-16 LAB — LIPID PANEL
Cholesterol: 171 mg/dL (ref 0–200)
HDL: 59.3 mg/dL (ref >39.00)
LDL Cholesterol: 101 mg/dL — ABNORMAL HIGH (ref 0–99)
Triglycerides: 56 mg/dL (ref 0.0–149.0)
VLDL: 11.2 mg/dL (ref 0.0–40.0)

## 2010-07-16 LAB — BASIC METABOLIC PANEL
BUN: 11 mg/dL (ref 6–23)
Calcium: 9.2 mg/dL (ref 8.4–10.5)
Creatinine, Ser: 0.9 mg/dL (ref 0.4–1.2)
GFR: 82.51 mL/min (ref >60.00)
Glucose, Bld: 92 mg/dL (ref 70–99)
Potassium: 4.9 mEq/L (ref 3.5–5.1)

## 2010-07-16 LAB — TSH: TSH: 2.07 u[IU]/mL (ref 0.35–5.50)

## 2010-07-16 NOTE — Telephone Encounter (Signed)
[id number] x1 

## 2010-07-17 ENCOUNTER — Encounter: Payer: Self-pay | Admitting: Pulmonary Disease

## 2010-07-17 NOTE — Telephone Encounter (Signed)
Pt returning call can be reached at [phone] or [phone].Paige Pena

## 2010-07-17 NOTE — Telephone Encounter (Signed)
Spoke to the pt and she states that her left leg aches, but looking back at last OV from 07-15-10 she had a negative doppler and SN rec low salt diet. The pt states her leg is not swollen it just aches. She wanted to know what her lab results were from that day to see if it could tell why her leg was aching. I advised I will send message to SN to review. Please advise on lab results.Carron Curie, CMA

## 2010-07-17 NOTE — Telephone Encounter (Signed)
Called and spoke with pt about her lab results per SN--all of her labs were normal but her sed rate was elevated which shows inflammation.  Per SN  Pt will need to call and set up appt with Dr. Debarah Crape stated that she is scheduled to have ct scan of her neck tomorrow and once she finds out the results of this she will call and get appt with Dr. Corliss Skains.  Pt stated that she wanted SN to be aware of this and will call back and let us know the results of the ct scan.  SN is aware.

## 2010-08-06 ENCOUNTER — Telehealth: Payer: Self-pay | Admitting: Pulmonary Disease

## 2010-08-06 NOTE — Telephone Encounter (Signed)
PA initiated for Omeprazole through [employer] at [phone]. Member ID # is [id number]. Case ID # is [id number]. Awaiting fax.

## 2010-08-06 NOTE — Telephone Encounter (Signed)
Forms have been signed by [facility] and faxed back.  Placed in triage to wait on approval.

## 2010-08-06 NOTE — Telephone Encounter (Signed)
Form received and placed on SN cart for review and signature.

## 2010-08-07 ENCOUNTER — Telehealth: Payer: Self-pay | Admitting: Pulmonary Disease

## 2010-08-07 NOTE — Telephone Encounter (Signed)
Spoke with pt and notified of recs per SN. She verbalized understanding, appt with SN sched for 09/16/10 at 3:30 pm.

## 2010-08-07 NOTE — Telephone Encounter (Signed)
Called and spoke with pt.  Pt states she saw. Dr. Corliss Skains for her inflammation.  She states Dr. Corliss Skains did bloodwork on pt and her SED rate was 61. Pt states she then had the SED rate re-drawn and it decreased down to 34.  Pt states she was told by Dr. Corliss Skains her inflammation wasn't coming from her fibromyalgia.  Pt is wanting to know if SN received the lab results and ov notes from Dr. Corliss Skains and what SN's recs and next steps were for pt.  Will forward message to SN to address.

## 2010-08-07 NOTE — Telephone Encounter (Signed)
Spoke with pt and advised of recs per SN.  Pt states that Dr Corliss Skains told her that she does not have fibromyalgia, and told her that she needed to followup with SN about elevated sed rate and that he may want to refer her to an oncologist. Please advise thanks!

## 2010-08-07 NOTE — Telephone Encounter (Signed)
Per SN---we have no labs from Dr. Corliss Skains.  SN recs that pt call Dr. Corliss Skains for elevated sed rate.  SN defers back to Dr. Corliss Skains.

## 2010-08-07 NOTE — Telephone Encounter (Signed)
Per SN---not sure why pt would need oncologist for an elevated sed rate.  Maybe we will need to send her for second opinion rhumatology appt.   She will need ov with SN to discuss all labs, etc. First aval is the end of July. thanks

## 2010-08-07 NOTE — Telephone Encounter (Signed)
Omeprazole APPROVED from 07/16/2010 through 08/06/2011. Pt and pharmacy notified.

## 2010-08-09 ENCOUNTER — Other Ambulatory Visit: Payer: Self-pay | Admitting: Pulmonary Disease

## 2010-09-05 DIAGNOSIS — R490 Dysphonia: Secondary | ICD-10-CM | POA: Insufficient documentation

## 2010-09-16 ENCOUNTER — Ambulatory Visit (INDEPENDENT_AMBULATORY_CARE_PROVIDER_SITE_OTHER): Payer: Medicare Other | Admitting: Pulmonary Disease

## 2010-09-16 ENCOUNTER — Other Ambulatory Visit (INDEPENDENT_AMBULATORY_CARE_PROVIDER_SITE_OTHER): Payer: Medicare Other

## 2010-09-16 ENCOUNTER — Encounter: Payer: Self-pay | Admitting: Pulmonary Disease

## 2010-09-16 DIAGNOSIS — E538 Deficiency of other specified B group vitamins: Secondary | ICD-10-CM

## 2010-09-16 DIAGNOSIS — F411 Generalized anxiety disorder: Secondary | ICD-10-CM

## 2010-09-16 DIAGNOSIS — IMO0001 Reserved for inherently not codable concepts without codable children: Secondary | ICD-10-CM

## 2010-09-16 DIAGNOSIS — M1990 Unspecified osteoarthritis, unspecified site: Secondary | ICD-10-CM

## 2010-09-16 DIAGNOSIS — D649 Anemia, unspecified: Secondary | ICD-10-CM

## 2010-09-16 DIAGNOSIS — K3184 Gastroparesis: Secondary | ICD-10-CM

## 2010-09-16 DIAGNOSIS — K589 Irritable bowel syndrome without diarrhea: Secondary | ICD-10-CM

## 2010-09-16 DIAGNOSIS — K5730 Diverticulosis of large intestine without perforation or abscess without bleeding: Secondary | ICD-10-CM

## 2010-09-16 DIAGNOSIS — R0789 Other chest pain: Secondary | ICD-10-CM

## 2010-09-16 DIAGNOSIS — R002 Palpitations: Secondary | ICD-10-CM

## 2010-09-16 DIAGNOSIS — R498 Other voice and resonance disorders: Secondary | ICD-10-CM

## 2010-09-16 DIAGNOSIS — I679 Cerebrovascular disease, unspecified: Secondary | ICD-10-CM

## 2010-09-16 DIAGNOSIS — E7800 Pure hypercholesterolemia, unspecified: Secondary | ICD-10-CM

## 2010-09-16 DIAGNOSIS — E559 Vitamin D deficiency, unspecified: Secondary | ICD-10-CM

## 2010-09-16 DIAGNOSIS — K219 Gastro-esophageal reflux disease without esophagitis: Secondary | ICD-10-CM

## 2010-09-16 LAB — CBC WITH DIFFERENTIAL/PLATELET
Basophils Absolute: 0 10*3/uL (ref 0.0–0.1)
Eosinophils Absolute: 0.1 10*3/uL (ref 0.0–0.7)
Eosinophils Relative: 2.3 % (ref 0.0–5.0)
HCT: 36.5 % (ref 36.0–46.0)
Lymphs Abs: 1.8 10*3/uL (ref 0.7–4.0)
MCHC: 34.3 g/dL (ref 30.0–36.0)
MCV: 87.7 fl (ref 78.0–100.0)
Monocytes Absolute: 0.5 10*3/uL (ref 0.1–1.0)
Neutrophils Relative %: 49.9 % (ref 43.0–77.0)
Platelets: 150 10*3/uL (ref 150.0–400.0)
RDW: 14.6 % (ref 11.5–14.6)
WBC: 4.9 10*3/uL (ref 4.5–10.5)

## 2010-09-16 LAB — BASIC METABOLIC PANEL
BUN: 18 mg/dL (ref 6–23)
Chloride: 97 mEq/L (ref 96–112)
Potassium: 4.1 mEq/L (ref 3.5–5.1)
Sodium: 136 mEq/L (ref 135–145)

## 2010-09-16 LAB — HEPATIC FUNCTION PANEL
ALT: 19 U/L (ref 0–35)
AST: 24 U/L (ref 0–37)
Alkaline Phosphatase: 90 U/L (ref 39–117)
Total Bilirubin: 0.6 mg/dL (ref 0.3–1.2)

## 2010-09-16 LAB — IBC PANEL
Iron: 87 ug/dL (ref 42–145)
Transferrin: 266.1 mg/dL (ref 212.0–360.0)

## 2010-09-16 NOTE — Patient Instructions (Signed)
Today we updated your med list in EPIC...    Continue your current meds the same, and take the ALPRAZOLAM 1/2 to 1 tab three times daily as we discussed...  Today we did your follow up blood work...    Please call the PHONE TREE in a few days for your results...    Dial [id number] & when prompted enter your patient number followed by the # symbol...    Your patient number is:  [id number]#  We will arrange for a Cardiac Evaluation w/ Dr. Garnette Scheuermann as you requested...  Call for any questions...  Let's plan a follow up visit in 3-4 months, sooner if needed for problems.Marland KitchenMarland Kitchen

## 2010-09-16 NOTE — Progress Notes (Signed)
Subjective:    Patient ID: Paige Pena, female    DOB: 1941-01-27, 70 y.o.   MRN: [id number]  HPI 70 y/o BF here for a follow up visit... she has multiple medical problems as noted below...    ~  May 23, 2009:  she has had GI evals from DrStark, and DrOrman at [facility] w/ IBS, bloating, delayed gastric emptying, globus sensation, fecal incontinence (their notes are reviewed w/ pt)> last note DrOrman 11/10- f/u mult GI complaints; they tried biofeedback, Zegerid, Domperidone 10mg Bid, Remeron Qhs, & ENT referral (Voice therapy)...  she switched the Zegerid to Omep40mg  for $$ reasons; asks that we send copy of colonoscopy to DrOrman; recently seen by GYN= DrCousins and started on LEXAPRO 10mg /d... she is quite anxious & not using her Alprazolam regularly & we discussed this...  ~  January 15, 2010:  she returns w/ mult somatic complaints> HA in left temple area, SOB- can't get a DB, pressure in abd, occas ankle pain, etc... she has seen DrDarling & Orman at [facility] w/ Dx of IBS, delayed gastric emptying, nausea, bloating, fecal incontinence- they wanted her to continue Remeron for sleep, Domperidone for delayed gastric emptying, & gave her a course of Rifaximin for the bloating... she also saw DrBuckmire [facility] for hoarseness w/ dysphonia, vocal fold weakness, & an epithelial lesion that was biopsied & benign... she also saw DrWillis for Neuro 7/11 w/ c/o memory problems & he did labs/ MRI Brain/ EEG> reported to be unremarkable...  ~  Jul 15, 2010:  90mo ROV & she continues to have mult complaints that seem revolve around 3 systems> GI, Neuro, & Rheum/Ortho:  GI problems as noted above w/ eval & rx from [facility] w/ referral to ENT as well w/ eval DrBuckmire & she says they saw something in her throat & are planning a CT scan for further eval;  Neuro eval DrWillis for memory & ?transient aphasia (exam was neg & rec to take ASA 81mg /d & given Axona dietary product);  She has seen DrDeveshwar in the past for  prob FM, OA, & DDD in lumbar spine> labs today showed elev sed rate (61) ?etiology & she is requested to f/u w/ Rheum for further investigation ==> (DrDeveshwar scared her & told her she might need an Oncology referral for the elev sed rate)...  ~  September 16, 2010:  45mo ROV & she is c/o SOB, can't get a deep breath, & occas nocturnal palpit; she remains quite anxious about her health & we discussed taking the Alpraz "combination relaxer" 1/2 Tid regularly;  She would like to see DrHSmith for Cards evaluation and reassurance about her cardiac status...    She has continued her evaluations & f/u at [facility] (ENT- DrBuckmire) w/ bilat pyriform plaques (r/o candida & rx w/ Nystatin oral) & vocal tremor; she continues on PPI rx for LER; they did CT scan & pt reports that this was neg; they rec voice therapy but she declined...    Note from [facility] 5/12 notes FM hx, incr fatigue, drowsy, denies joint swelling/ muscle tenderness/ or weakness; exam +for tender points & generalized pain; given Ambien for sleep; she repeated labs & Sed=34, ANA neg, SPE w/o Mspike; pt states that DrDeveshwar referred her back here to evaluate the Sed rate ("she doesn't do that") & mentioned that she might need to see an oncologist & this really concerned the pt;  We discussed this & decided on further f/u labs at this time.Marland KitchenMarland Kitchen  Problem List:    Hx of DYSPHONIA (ZOX-096.04) - She has seen ENT- DrTeoh on several occas w/ cerumen impactions, sensorineural hearing loss, dysphonia & globus phenomenon (the latter felt secondary to GERD)... laryngoscopy was neg... she was maintained on Zegerid 40mg  Bid & switched to OMEPRAZOLE 40mg /d for $$ reasons... ~  2010:  referred by [facility] to their ENT Dept for voice therapy w/ eval by DrBuckmire- dysphonia & vocal fold weakness, plus an epithelial lesion that was biopsied & benign... ~  2012:  Further eval by [facility] GI & ENT Depts> bilat pyriform plaques (r/o candida & rx w/  Nystatin oral- resolved) & vocal tremor; she continues on PPI rx for LER; they did CT scan & pt reports that this was neg; they rec voice therapy but she declined...  R/O SLEEP APNEA (ICD-780.57) - sleep study 4/08 showed RDI= 6 & some restless leg symptoms (tried Requip)... DrDeveshwar has tried Provigil for Idiopathic Hypersomnolence...  Hx of ASTHMATIC BRONCHITIS, ACUTE (ICD-466.0) - no recent URIs reported... ~  CXR 1/12 showed norm heart size, tort aorta, clear lungs, DJD Tspine & mild scoliosis...  Hx of CHEST PAIN, ATYPICAL (ICD-786.59)  ~  cardiac cath 1997 showed normal coronaries ~  2DEcho 9/03 showed mild conc LVH, norm EF, mild AoV sclerosis, mild thickening MV leaflets, mild MR... ~  NuclearStressTest 11/08 was WNL- no ischemia, no infarct, EF= 79%... ~  7/12:  She requests cardiac eval from DrHSmith for reassurance regarding palpitation...  CEREBROVASCULAR DISEASE (ICD-437.9) - on ASA 325mg /d... she was eval by DrStiefel for neurology yrs ago... ~  MRI Brain 6/06 showed remote lacunar infarcts in the cerebellum bilaterally, chr small vessel dis, NAD... ~  2011:  Neuro eval by DrWillis for memory disturbance & report of transient aphasia> we don't have the scan report but his note indicates MRI relatively unremarkable & EEG was WNL; rec ASA & Axona.  HYPERCHOLESTEROLEMIA (ICD-272.0)0 - on diet alone... ~  FLP 11/08 showed TChol 212, TG 57, HDL 68, LDL 132 ~  FLP 1/10 showed TChol 207, TG 62, HDL 64, LDL 127... she prefers to continue diet management. ~  FLP 3/11 showed TChol 238, TG 61, HDL 71, LDL 154... she is reluctant to consider Statin Rx due to side effects. ~  FLP 11/11 showed TChol 221, TG 54, HDL 66, LDL 129 ~  FLP 5/12 showed TChol 171, TG 56, HDL 59, LDL 101... rec to continue diet & exercise program...  GERD (ICD-530.81), & ESOPHAGEAL STRICTURE (ICD-530.3) - on OMEPRAZOLE 40mg /d now + off prev Carafate Rx... ~  EGD 7/07 by DrStark showed GERD & stricture-  dilated... ~  pt seen by GI/ DrStark Jun09- outlined plans for Rx w/ Zegerid, Domperidone, Carafate... ~  2010: she had 2nd opinion consult [facility] DrOrman & Piedad Climes- mult GI complaints w/ IBS, delayed gastric emptying, hx dysphagia, globus sensation, & fecal incontinence... treated w/ PPI, Dromperidone, Remeron, & Voice therapy w/ referral to ENT Dept. ~  2011:  Further eval & testing at [facility] (we don't have records from GI)...  GASTROPARESIS (ICD-536.3) - tried REGLAN- discontinued due to reaction w/ anxiety... DrStark added DOMPERIDONE Prn... ~  Gastric Emptying Scan 10/08 showed 93% activity remaining in the stomach at 60 min, and 75% remaining at 2H (normal is <30% at [facility])...  DIVERTICULOSIS OF COLON (ICD-562.10),  IRRITABLE BOWEL SYNDROME (ICD-564.1), & HEMORRHOIDS (ICD-455.6) -  ~  CT Abd & Pelvis in 2001 was WNL... ~  colonoscopy 9/06 by DrStark showed divertics and int  hems... she had spasm in the sigmoid region.  URINARY INCONTINENCE (ICD-788.30) - on DETROL LA 4mg /d...  HEMATURIA, MICROSCOPIC, HX OF (ICD-V13.09)  DEGENERATIVE JOINT DISEASE (ICD-715.90) - she has some DDD in lumbar spine and osteoarthritis in knees... ~  5/12:  She called w/ leg discomfort & some subjective swelling on left side> VenDopplers were neg/ normal bilaterally...  FIBROMYALGIA (ICD-729.1) - followed by DrDeveshwar for Rheum... c/o insomnia, fatigue, fogginess, and mult somatic complaints "I stay tired"... tried Ambien, Provigil, etc... ~  5/12:  She has mult somatic complaints; routine lab showed Sed rate= 61 ?etiology & she is referred back to [facility] for further eval... ~  7/12:  DrDeveshwar referred her back here & serial Sed rates= 61, 34, 16 (now wnl)...  ANXIETY (ICD-300.00) - prev tried on ALPRAZOLAM 0.5mg - 1/2 to 1 tab three times a day as needed... DrDarling at [facility] rec REMERON 15mg  for sleep... DrCousins, GYN Rx w/ LEXAPRO 10mg /d...  VITAMIN D DEFICIENCY (ICD-268.9) - on Vit D 50K  weekly... ~  Vit D level 1/10 = 15 (30-90) therefore started on Vit D 50,000 u weekly... ~  labs 3/11 showed Vit D level = 39 ~  labs 11/11 showed Vit D level = 59... continue 50K weekly at her request..  HEALTH MAINTENANCE>  GYN= DrCousins w/ PAP Q5yrs, seen 3/11 & started on Lexapro...  Mammograms at [facility]- done in 2009 neg x cysts... BMD at [facility] ~47yrs ago per pt (we only have copy of BMD from 2002= WNL w/ pos TScores)...   Past Surgical History  Procedure Date  . Abdominal hysterectomy 1995  . Corneal transplant for keratonconus   . Right bunion surgery   . Left foot fracture with orif 07/2004    Dr. Lajoyce Corners    Outpatient Encounter Prescriptions as of 09/16/2010  Medication Sig Dispense Refill  . ALPRAZolam (XANAX) 0.5 MG tablet TAKE 1/2 TO 1 TABLET BY MOUTH THREE TIMES DAILY  100 tablet  5  . aspirin (ECOTRIN) 325 MG EC tablet Take 325 mg by mouth daily.        . ergocalciferol (VITAMIN D2) 50000 UNITS capsule Take 1 capsule (50,000 Units total) by mouth once a week.  4 capsule  11  . escitalopram (LEXAPRO) 10 MG tablet Take 10 mg by mouth daily. As directed by Dr. Cherly Hensen       . mirtazapine (REMERON) 15 MG tablet Take 15 mg by mouth at bedtime.        Marland Kitchen omeprazole (PRILOSEC) 40 MG capsule Take 1 capsule (40 mg total) by mouth daily.  30 capsule  11  . tolterodine (DETROL LA) 4 MG 24 hr capsule Take 4 mg by mouth at bedtime.          Allergies  Allergen Reactions  . Metoclopramide Hcl     REACTION: anxious and out of her mind  . Morphine     REACTION: lethargy/malaise  . Penicillins     Rash, itching  . Prednisone     REACTION: rash/hives  . Sulfonamide Derivatives     REACTION: itching    Review of Systems         See HPI - all other systems neg except as noted... The patient complains of hoarseness, dyspnea on exertion, abdominal pain, and muscle weakness.  The patient denies anorexia, fever, weight loss, weight gain, vision loss, decreased hearing, chest pain, syncope,  peripheral edema, prolonged cough, headaches, hemoptysis, melena, hematochezia, severe indigestion/heartburn, hematuria, incontinence, suspicious skin lesions, transient blindness, difficulty walking,  depression, unusual weight change, abnormal bleeding, enlarged lymph nodes, and angioedema.     Objective:   Physical Exam     WD, WN, 70 y/o BF in NAD, she appears anxious & has mult +trigger points. GENERAL:  Alert & oriented; pleasant & cooperative... HEENT:  [place]/AT, EOM-wnl, PERRLA, EACs-clear, TMs-wnl, NOSE-clear, THROAT-clear & wnl. NECK:  Supple w/ fairROM; no JVD; normal carotid impulses w/o bruits; no thyromegaly or nodules palpated; no lymphadenopathy. CHEST:  Clear to P & A; without wheezes/ rales/ or rhonchi heard... HEART:  Regular Rhythm; without murmurs/ rubs/ or gallops detected... ABDOMEN:  Soft & nontender; normal bowel sounds; no organomegaly or masses palpated... EXT: without deformities, mild arthritic changes; no varicose veins/ venous insuffic/ or edema. NEURO:  CN's intact; motor testing normal; sensory testing normal; gait normal & balance OK. DERM:  no lesions seen...   Assessment & Plan:   DYSPHONIA>  ENT eval at [facility] is on-going & she is on antireflux regimen & PPI; rec to continue regular f/u w/ ENT & follow their recommendations; treated w/ Nystatin for candida & resolved; CT scan is reported neg; they suggested voice therapy but she declined...  PALPITATIONS>  She has hx atypCP w/ neg cat 1997 & neg Myoview2008; she is asked to avoid caffeine etc and take the Alprazolam regularly; she requests Cards eval from DrHSmith...  CHOL>  On diet alone & FLP is improved, continue diet + exercise program...  GI>  Extensive GI evals in [place] & [facility] as noted> GERD, Hx stricture, Gastroparesis, Divertics, IBS, Hems> currently only taking Omep40mg /d w/ prev regimen outlined above; she is rec to f/u w/ her gastroenterologist at [facility] follow their recommendations (we don't have notes  from them)...  RHEUM>  FM/ OA/ DDD lumbar spine, etc>  Followed by DrDeveshwar & recent eval did not shed light on etiology of elev sed rate; pt states DrDeveshwar wants [place] to work this up further & may need Oncology eval (?); serial sed rate analyses showed ret to norm= 16 today & she is reassured...  NEURO>  followed by DrWillis w/ hx memory disturbance, ?transient aphasia, prev MRI w/ lacunar infarcts in cerebellum & sm vessel dis> on ASA daily & rec to take Axona.  Anxiety & ?depression> on Alprazolam prn & she is asked to take this more regularly (~1/2 Tid);  her GYN DrCousins has given her Lexapro for depression.Marland KitchenMarland Kitchen

## 2010-09-19 ENCOUNTER — Encounter: Payer: Self-pay | Admitting: Pulmonary Disease

## 2010-10-18 ENCOUNTER — Emergency Department (HOSPITAL_COMMUNITY): Payer: Medicare Other

## 2010-10-18 ENCOUNTER — Emergency Department (HOSPITAL_COMMUNITY)
Admission: EM | Admit: 2010-10-18 | Discharge: 2010-10-18 | Disposition: A | Discharge status: Home / Self Care | Payer: Medicare Other | Attending: Emergency Medicine | Admitting: Emergency Medicine

## 2010-10-18 DIAGNOSIS — R413 Other amnesia: Secondary | ICD-10-CM | POA: Insufficient documentation

## 2010-10-18 DIAGNOSIS — Z8673 Personal history of transient ischemic attack (TIA), and cerebral infarction without residual deficits: Secondary | ICD-10-CM | POA: Insufficient documentation

## 2010-10-18 DIAGNOSIS — I739 Peripheral vascular disease, unspecified: Secondary | ICD-10-CM | POA: Insufficient documentation

## 2010-10-18 DIAGNOSIS — K589 Irritable bowel syndrome without diarrhea: Secondary | ICD-10-CM | POA: Insufficient documentation

## 2010-10-18 DIAGNOSIS — R110 Nausea: Secondary | ICD-10-CM | POA: Insufficient documentation

## 2010-10-18 DIAGNOSIS — R002 Palpitations: Secondary | ICD-10-CM | POA: Insufficient documentation

## 2010-10-18 DIAGNOSIS — R5381 Other malaise: Secondary | ICD-10-CM | POA: Insufficient documentation

## 2010-10-18 DIAGNOSIS — K219 Gastro-esophageal reflux disease without esophagitis: Secondary | ICD-10-CM | POA: Insufficient documentation

## 2010-10-18 LAB — URINE MICROSCOPIC-ADD ON

## 2010-10-18 LAB — CBC
HCT: 39.3 % (ref 36.0–46.0)
Hemoglobin: 13.1 g/dL (ref 12.0–15.0)
MCV: 86.9 fL (ref 78.0–100.0)
Platelets: 151 10*3/uL (ref 150–400)
RBC: 4.52 MIL/uL (ref 3.87–5.11)
WBC: 3.9 10*3/uL — ABNORMAL LOW (ref 4.0–10.5)

## 2010-10-18 LAB — COMPREHENSIVE METABOLIC PANEL
ALT: 10 U/L (ref 0–35)
AST: 16 U/L (ref 0–37)
CO2: 28 mEq/L (ref 19–32)
Chloride: 105 mEq/L (ref 96–112)
Creatinine, Ser: 0.74 mg/dL (ref 0.50–1.10)
GFR calc Af Amer: >60 mL/min (ref >60)
GFR calc non Af Amer: >60 mL/min (ref >60)
Glucose, Bld: 85 mg/dL (ref 70–99)
Total Bilirubin: 0.4 mg/dL (ref 0.3–1.2)

## 2010-10-18 LAB — URINALYSIS, ROUTINE W REFLEX MICROSCOPIC
Bilirubin Urine: NEGATIVE
Specific Gravity, Urine: 1.014 (ref 1.005–1.030)
pH: 6 (ref 5.0–8.0)

## 2010-10-18 LAB — PROTIME-INR: INR: 1.04 (ref 0.00–1.49)

## 2010-10-19 LAB — URINE CULTURE

## 2010-10-21 LAB — POCT I-STAT TROPONIN I: Troponin i, poc: 0.02 ng/mL (ref 0.00–0.08)

## 2010-10-23 ENCOUNTER — Other Ambulatory Visit: Payer: Self-pay | Admitting: Pulmonary Disease

## 2010-11-13 ENCOUNTER — Telehealth: Payer: Self-pay | Admitting: Pulmonary Disease

## 2010-11-13 MED ORDER — PROMETHAZINE HCL 25 MG PO TABS: 25.0000 mg | ORAL_TABLET | Freq: Four times a day (QID) | ORAL | Status: AC | PRN

## 2010-11-13 NOTE — Telephone Encounter (Signed)
Spoke with pt. She states has had poor appetite, achy feeling in stomach, and increased belching "with acid taste"- onset x 2 wks ago. She states that for the past few hours she has been vomiting and feels very nauseated. She states that she has not had diarrhea, but stools are soft and scant. Has tried to get appt with GI @ [facility] but "could not get through". Would like recs from SN. Please advise, thanks! Allergies  Allergen Reactions  . Metoclopramide Hcl     REACTION: anxious and out of her mind  . Morphine     REACTION: lethargy/malaise  . Penicillins     Rash, itching  . Prednisone     REACTION: rash/hives  . Sulfonamide Derivatives     REACTION: itching

## 2010-11-13 NOTE — Telephone Encounter (Signed)
[email or web address]  

## 2010-11-13 NOTE — Telephone Encounter (Signed)
Called and spoke with pt and she is aware of phenergan that has been sent to the pharmacy for the pt per SN.   Will need to call the GI doctor at [facility] to make an asap appt for follow up.  Pt is aware that since she has had the reflux x 2-3 weeks she can increase the omeprazole to bid for now.  Pt voiced her understanding and is aware of meds sent to the pharmacy

## 2010-11-13 NOTE — Telephone Encounter (Signed)
Pt returning call can be reached at [phone].Paige Pena

## 2011-01-10 ENCOUNTER — Telehealth: Payer: Self-pay | Admitting: Pulmonary Disease

## 2011-01-10 DIAGNOSIS — E7800 Pure hypercholesterolemia, unspecified: Secondary | ICD-10-CM

## 2011-01-10 DIAGNOSIS — K219 Gastro-esophageal reflux disease without esophagitis: Secondary | ICD-10-CM

## 2011-01-10 DIAGNOSIS — J209 Acute bronchitis, unspecified: Secondary | ICD-10-CM

## 2011-01-10 DIAGNOSIS — R0789 Other chest pain: Secondary | ICD-10-CM

## 2011-01-10 DIAGNOSIS — K5730 Diverticulosis of large intestine without perforation or abscess without bleeding: Secondary | ICD-10-CM

## 2011-01-10 NOTE — Telephone Encounter (Signed)
Per SN--ok to recheck labs---cbcd, bmp, hepat, sed rate.  These orders have been placed in the computer for the pt and i called and lmom to make the pt aware of orders.

## 2011-01-10 NOTE — Telephone Encounter (Signed)
I spoke with pt and she states she has an apt with SN on 01/13/11 and would like to have labs done at that time. Pt states she has had problems with her sed-rate in the past and would like to have full bloodwork done. Pt would like a call back on her cell phone.  Please advise Dr. Kriste Basque, thanks

## 2011-01-13 ENCOUNTER — Other Ambulatory Visit (INDEPENDENT_AMBULATORY_CARE_PROVIDER_SITE_OTHER): Payer: Medicare Other

## 2011-01-13 ENCOUNTER — Ambulatory Visit: Payer: Medicare Other | Admitting: Pulmonary Disease

## 2011-01-13 DIAGNOSIS — E7800 Pure hypercholesterolemia, unspecified: Secondary | ICD-10-CM

## 2011-01-13 DIAGNOSIS — K219 Gastro-esophageal reflux disease without esophagitis: Secondary | ICD-10-CM

## 2011-01-13 DIAGNOSIS — K5730 Diverticulosis of large intestine without perforation or abscess without bleeding: Secondary | ICD-10-CM

## 2011-01-13 DIAGNOSIS — R0789 Other chest pain: Secondary | ICD-10-CM

## 2011-01-13 DIAGNOSIS — J209 Acute bronchitis, unspecified: Secondary | ICD-10-CM

## 2011-01-13 LAB — BASIC METABOLIC PANEL
CO2: 29 mEq/L (ref 19–32)
Calcium: 9 mg/dL (ref 8.4–10.5)
Creatinine, Ser: 1 mg/dL (ref 0.4–1.2)
Glucose, Bld: 92 mg/dL (ref 70–99)
Sodium: 141 mEq/L (ref 135–145)

## 2011-01-13 LAB — CBC WITH DIFFERENTIAL/PLATELET
Eosinophils Relative: 1.8 % (ref 0.0–5.0)
HCT: 39.1 % (ref 36.0–46.0)
Hemoglobin: 13.3 g/dL (ref 12.0–15.0)
Lymphs Abs: 1.4 10*3/uL (ref 0.7–4.0)
Monocytes Relative: 5.5 % (ref 3.0–12.0)
Neutro Abs: 3.6 10*3/uL (ref 1.4–7.7)
RBC: 4.37 Mil/uL (ref 3.87–5.11)
WBC: 5.5 10*3/uL (ref 4.5–10.5)

## 2011-01-13 LAB — HEPATIC FUNCTION PANEL
Albumin: 3.8 g/dL (ref 3.5–5.2)
Alkaline Phosphatase: 78 U/L (ref 39–117)
Total Protein: 6.9 g/dL (ref 6.0–8.3)

## 2011-01-22 ENCOUNTER — Ambulatory Visit (INDEPENDENT_AMBULATORY_CARE_PROVIDER_SITE_OTHER): Payer: Medicare Other | Admitting: Pulmonary Disease

## 2011-01-22 ENCOUNTER — Other Ambulatory Visit: Payer: Self-pay | Admitting: Pulmonary Disease

## 2011-01-22 ENCOUNTER — Encounter: Payer: Self-pay | Admitting: Pulmonary Disease

## 2011-01-22 DIAGNOSIS — IMO0001 Reserved for inherently not codable concepts without codable children: Secondary | ICD-10-CM

## 2011-01-22 DIAGNOSIS — R498 Other voice and resonance disorders: Secondary | ICD-10-CM

## 2011-01-22 DIAGNOSIS — K3184 Gastroparesis: Secondary | ICD-10-CM

## 2011-01-22 DIAGNOSIS — F411 Generalized anxiety disorder: Secondary | ICD-10-CM

## 2011-01-22 DIAGNOSIS — R002 Palpitations: Secondary | ICD-10-CM

## 2011-01-22 DIAGNOSIS — I679 Cerebrovascular disease, unspecified: Secondary | ICD-10-CM

## 2011-01-22 DIAGNOSIS — K589 Irritable bowel syndrome without diarrhea: Secondary | ICD-10-CM

## 2011-01-22 DIAGNOSIS — K219 Gastro-esophageal reflux disease without esophagitis: Secondary | ICD-10-CM

## 2011-01-22 MED ORDER — ERGOCALCIFEROL 1.25 MG (50000 UT) PO CAPS: 50000.0000 [IU] | ORAL_CAPSULE | ORAL | Status: DC

## 2011-01-22 NOTE — Patient Instructions (Addendum)
Today we updated your med list in our EPIC system...    Continue your current medications the same...  Continue your regular follow up visits w/ your specialists including DrHSmith, DrOrman, & DrWillis...  Call if I can be of service in any way...  Let's continue our every 4 month follow up visits to monitor your progress.Marland KitchenMarland Kitchen

## 2011-01-22 NOTE — Progress Notes (Signed)
Subjective:    Patient ID: Paige Pena, female    DOB: 05-29-1940, 70 y.o.   MRN: [id number]  HPI 70 y/o BF here for a follow up visit... she has multiple medical problems as noted below... Her daughter is a Publishing rights manager.  ~  May 23, 2009:  she has had GI evals from DrStark, and DrOrman at [facility] w/ IBS, bloating, delayed gastric emptying, globus sensation, fecal incontinence (their notes are reviewed w/ pt)> last note DrOrman 11/10- f/u mult GI complaints; they tried biofeedback, Zegerid, Domperidone 10mg Bid, Remeron Qhs, & ENT referral (Voice therapy)...  she switched the Zegerid to Omep40mg  for $$ reasons; asks that we send copy of colonoscopy to DrOrman; recently seen by GYN= DrCousins and started on LEXAPRO 10mg /d... she is quite anxious & not using her Alprazolam regularly & we discussed this...  ~  January 15, 2010:  she returns w/ mult somatic complaints> HA in left temple area, SOB- can't get a DB, pressure in abd, occas ankle pain, etc... she has seen DrDarling & Orman at [facility] w/ Dx of IBS, delayed gastric emptying, nausea, bloating, fecal incontinence- they wanted her to continue Remeron for sleep, Domperidone for delayed gastric emptying, & gave her a course of Rifaximin for the bloating... she also saw DrBuckmire [facility] for hoarseness w/ dysphonia, vocal fold weakness, & an epithelial lesion that was biopsied & benign... she also saw DrWillis for Neuro 7/11 w/ c/o memory problems & he did labs/ MRI Brain/ EEG> reported to be unremarkable...  ~  Jul 15, 2010:  71mo ROV & she continues to have mult complaints that seem revolve around 3 systems> GI, Neuro, & Rheum/Ortho:  GI problems as noted above w/ eval & rx from [facility] w/ referral to ENT as well w/ eval DrBuckmire & she says they saw something in her throat & are planning a CT scan for further eval;  Neuro eval DrWillis for memory & ?transient aphasia (exam was neg & rec to take ASA 81mg /d & given Axona dietary product);  She  has seen DrDeveshwar in the past for prob FM, OA, & DDD in lumbar spine> labs today showed elev sed rate (61) ?etiology & she is requested to f/u w/ Rheum for further investigation ==> (DrDeveshwar scared her & told her she might need an Oncology referral for the elev sed rate)...  ~  September 16, 2010:  70mo ROV & she is c/o SOB, can't get a deep breath, & occas nocturnal palpit; she remains quite anxious about her health & we discussed taking the Alpraz "combination relaxer" 1/2 Tid regularly;  She would like to see DrHSmith for Cards evaluation and reassurance about her cardiac status...    She has continued her evaluations & f/u at [facility] (ENT- DrBuckmire) w/ bilat pyriform plaques (r/o candida & rx w/ Nystatin oral) & vocal tremor; she continues on PPI rx for LER; they did CT scan & pt reports that this was neg; they rec voice therapy but she declined...    Note from [facility] 5/12 notes FM hx, incr fatigue, drowsy, denies joint swelling/ muscle tenderness/ or weakness; exam +for tender points & generalized pain; given Ambien for sleep; she repeated labs & Sed=34, ANA neg, SPE w/o Mspike; pt states that DrDeveshwar referred her back here to evaluate the Sed rate ("she doesn't do that") & mentioned that she might need to see an oncologist & this really concerned the pt;  We discussed this & decided on further f/u labs at this time (Sed  improved 61==>16)...  ~  January 22, 2011:  70mo ROV & she is unchanged clinically> not feeling well, c/o stomach problems, sluggish, etc... She went to the ER 8/12 w/ "palpit & memory loss", exam neg & no neuro deficits, Labs ok, CT Brain neg w/o acute changes & just some sm vessel dis, given Ativan & advised f/u primary MD... She has seen DrWillis in past for Neuro> he rec Axona but she wouldn't take it preferring a brain supplement she saw on TV...    She had Cards f/u DrHSmith 9/12> c/o palpit, he noted neg prior cardiac work up w/o ischemia on Myoview 2008 & no CAD on  cath 1997; he noted normal EKG & ordered Holter monitor    She had GI f/u DrBarritt & Orman at [facility] 9/12> IBS, delayed gastric emptying, nausea, bloating, fecal incont> on Omep40 & Domperidone 10mg Tid; they suspected sm bowel bact overgrowth that responded prev to rifaximin (Xifaxan Rx written) & consider anorectal biofeedback; she was quite anxious & wanted to know when her next Colonoscopy was due (last 2006 was neg & f/u due 2016)...          Problem List:    Hx of DYSPHONIA (WJX-914.78) - She has seen ENT- DrTeoh on several occas w/ cerumen impactions, sensorineural hearing loss, dysphonia & globus phenomenon (the latter felt secondary to GERD)... laryngoscopy was neg... she was maintained on Zegerid 40mg  Bid & switched to OMEPRAZOLE 40mg /d for $$ reasons... ~  2010:  referred by [facility] to their ENT Dept for voice therapy w/ eval by DrBuckmire- dysphonia & vocal fold weakness, plus an epithelial lesion that was biopsied & benign... ~  2012:  Further eval by [facility] GI [facility] Dept[facility]> bilat pyriform plaques (r/o candida & rx w/ Nystatin oral- resolved) & vocal tremor; she continues on PPI rx for LER; they did CT scan & pt reports that this was neg; they rec voice therapy but she declined... ~  11/12: she reports that voice, throat are better; rec to continue the PPI & antireflux regimen...  R/O SLEEP APNEA (ICD-780.57) - sleep study 4/08 showed RDI= 6 & some restless leg symptoms (tried Requip)... DrDeveshwar has tried Provigil for Idiopathic Hypersomnolence...  Hx of ASTHMATIC BRONCHITIS, ACUTE (ICD-466.0) - no recent URIs reported... ~  CXR 1/12 showed norm heart size, tort aorta, clear lungs, DJD Tspine & mild scoliosis...  Hx of ATYPICAL CHEST PAIN, & PALPITATIONS >> Cardiac eval by DrHSmith... ~  cardiac cath 1997 showed normal coronaries ~  2DEcho 9/03 showed mild conc LVH, norm EF, mild AoV sclerosis, mild thickening MV leaflets, mild MR... ~  NuclearStressTest 11/08 was WNL- no  ischemia, no infarct, EF= 79%... ~  7/12:  She requests cardiac eval from DrHSmith for reassurance regarding palpitation; seen 9/12 w/ norm EKG & Holter done (we don't have results)...  CEREBROVASCULAR DISEASE (ICD-437.9) - on ASA 325mg /d... she was eval by DrStiefel for yrs ago & DrWillis more recently... ~  MRI Brain 6/06 showed remote lacunar infarcts in the cerebellum bilaterally, chr small vessel dis, NAD... ~  2011:  Neuro eval by DrWillis for memory disturbance & report of transient aphasia> we don't have the scan report but his note indicates MRI relatively unremarkable & EEG was WNL; rec ASA & Axona (she never tried it). ~  Seen in ER 8/12 w/ palpit & memory loss> CT Brain was neg x sm vessel dis...  HYPERCHOLESTEROLEMIA (ICD-272.0)0 - on diet alone... ~  FLP 11/08 showed TChol 212,  TG 57, HDL 68, LDL 132 ~  FLP 1/10 showed TChol 207, TG 62, HDL 64, LDL 127... she prefers to continue diet management. ~  FLP 3/11 showed TChol 238, TG 61, HDL 71, LDL 154... she is reluctant to consider Statin Rx due to side effects. ~  FLP 11/11 showed TChol 221, TG 54, HDL 66, LDL 129 ~  FLP 5/12 showed TChol 171, TG 56, HDL 59, LDL 101... rec to continue diet & exercise program...  GERD (ICD-530.81), & ESOPHAGEAL STRICTURE (ICD-530.3) - on OMEPRAZOLE 40mg /d now + off prev Carafate Rx... ~  EGD 7/07 by DrStark showed GERD & stricture- dilated... ~  pt seen by GI/ DrStark Jun09- outlined plans for Rx w/ Zegerid, Domperidone, Carafate... ~  2010: she had 2nd opinion consult [facility] DrOrman & Piedad Climes- mult GI complaints w/ IBS, delayed gastric emptying, hx dysphagia, globus sensation, & fecal incontinence... treated w/ PPI, Dromperidone, Remeron, & Voice therapy w/ referral to ENT Dept. ~  2011:  Further eval & testing at [facility] (we don't have records from GI)... ~  9/12:  She had f/u [facility] DrBarritt & Orman> no changes made, they rec Ativan Tid...  GASTROPARESIS (ICD-536.3) - tried REGLAN- discontinued due  to reaction w/ anxiety... DrStark added DOMPERIDONE 10mg  Tid... ~  Gastric Emptying Scan 10/08 showed 93% activity remaining in the stomach at 60 min, and 75% remaining at 2H (normal is <30% at [facility])...  DIVERTICULOSIS OF COLON (ICD-562.10),  IRRITABLE BOWEL SYNDROME (ICD-564.1), & HEMORRHOIDS (ICD-455.6) -  ~  CT Abd & Pelvis in 2001 was WNL... ~  colonoscopy 9/06 by DrStark showed divertics and int hems... she had spasm in the sigmoid region.  URINARY INCONTINENCE (ICD-788.30) - on DETROL LA 4mg /d...  HEMATURIA, MICROSCOPIC, HX OF (ICD-V13.09)  DEGENERATIVE JOINT DISEASE (ICD-715.90) - she has some DDD in lumbar spine and osteoarthritis in knees... ~  5/12:  She called w/ leg discomfort & some subjective swelling on left side> VenDopplers were neg/ normal bilaterally...  FIBROMYALGIA (ICD-729.1) - followed by DrDeveshwar for Rheum... c/o insomnia, fatigue, fogginess, and mult somatic complaints "I stay tired"... tried Ambien, Provigil, etc... ~  5/12:  She has mult somatic complaints; routine lab showed Sed rate= 61 ?etiology & she is referred back to [facility] for further eval... ~  7/12:  DrDeveshwar referred her back here & serial Sed rates= 61, 34, 16 (now wnl)...  ANXIETY (ICD-300.00) - prev tried on ALPRAZOLAM 0.5mg - 1/2 to 1 tab three times a day as needed... DrDarling at [facility] rec REMERON 15mg  for sleep... DrCousins, GYN Rx w/ LEXAPRO 10mg /d...  VITAMIN D DEFICIENCY (ICD-268.9) - on Vit D 50K weekly... ~  Vit D level 1/10 = 15 (30-90) therefore started on Vit D 50,000 u weekly... ~  labs 3/11 showed Vit D level = 39 ~  labs 11/11 showed Vit D level = 59... continue 50K weekly at her request..  HEALTH MAINTENANCE>  GYN= DrCousins w/ PAP Q25yrs, seen 3/11 & started on Lexapro...  Mammograms at [facility]- done in 2009 neg x cysts... BMD at [facility] ~80yrs ago per pt (we only have copy of BMD from 2002= WNL w/ pos TScores)...   Past Surgical History  Procedure Date  . Abdominal hysterectomy  1995  . Corneal transplant for keratonconus   . Right bunion surgery   . Left foot fracture with orif 07/2004    Dr. Lajoyce Corners    Outpatient Encounter Prescriptions as of 01/22/2011  Medication Sig Dispense Refill  . ALPRAZolam (XANAX) 0.5 MG tablet  TAKE 1/2 TO 1 TABLET BY MOUTH THREE TIMES DAILY  100 tablet  5  . aspirin (ECOTRIN) 325 MG EC tablet Take 325 mg by mouth daily.        Marland Kitchen DETROL LA 4 MG 24 hr capsule TAKE ONE CAPSULE BY MOUTH EVERY NIGHT AT BEDTIME  30 capsule  5  . ergocalciferol (VITAMIN D2) 50000 UNITS capsule Take 1 capsule (50,000 Units total) by mouth once a week.  4 capsule  11  . escitalopram (LEXAPRO) 10 MG tablet Take 10 mg by mouth daily. As directed by Dr. Cherly Hensen       . metoprolol succinate (TOPROL-XL) 25 MG 24 hr tablet Take 25 mg by mouth daily.        Marland Kitchen omeprazole (PRILOSEC) 40 MG capsule Take 1 capsule (40 mg total) by mouth daily.  30 capsule  11  . zolpidem (AMBIEN) 10 MG tablet Take 10 mg by mouth at bedtime as needed.        Marland Kitchen DISCONTD: mirtazapine (REMERON) 15 MG tablet Take 15 mg by mouth at bedtime.          Allergies  Allergen Reactions  . Metoclopramide Hcl     REACTION: anxious and out of her mind  . Morphine     REACTION: lethargy/malaise  . Penicillins     Rash, itching  . Prednisone     REACTION: rash/hives  . Sulfonamide Derivatives     REACTION: itching    Review of Systems         See HPI - all other systems neg except as noted... The patient complains of hoarseness, dyspnea on exertion, abdominal pain, and muscle weakness.  The patient denies anorexia, fever, weight loss, weight gain, vision loss, decreased hearing, chest pain, syncope, peripheral edema, prolonged cough, headaches, hemoptysis, melena, hematochezia, severe indigestion/heartburn, hematuria, incontinence, suspicious skin lesions, transient blindness, difficulty walking, depression, unusual weight change, abnormal bleeding, enlarged lymph nodes, and angioedema.      Objective:   Physical Exam     WD, WN, 70 y/o BF in NAD, she appears anxious & has mult +trigger points. GENERAL:  Alert & oriented; pleasant & cooperative... HEENT:  [place]/AT, EOM-wnl, PERRLA, EACs-clear, TMs-wnl, NOSE-clear, THROAT-clear & wnl. NECK:  Supple w/ fairROM; no JVD; normal carotid impulses w/o bruits; no thyromegaly or nodules palpated; no lymphadenopathy. CHEST:  Clear to P & A; without wheezes/ rales/ or rhonchi heard... HEART:  Regular Rhythm; without murmurs/ rubs/ or gallops detected... ABDOMEN:  Soft & nontender; normal bowel sounds; no organomegaly or masses palpated... EXT: without deformities, mild arthritic changes; no varicose veins/ venous insuffic/ or edema. NEURO:  CN's intact; motor testing normal; sensory testing normal; gait normal & balance OK. DERM:  no lesions seen...  RADIOLOGY DATA:  Reviewed in the EPIC EMR & discussed w/ the patient...  LABORATORY DATA:  Reviewed in the EPIC EMR & discussed w/ the patient...   Assessment & Plan:   DYSPHONIA>  ENT eval at [facility] is on-going & she is on antireflux regimen & PPI; rec to continue regular f/u w/ ENT & follow their recommendations; treated w/ Nystatin for candida & resolved; CT scan is reported neg; they suggested voice therapy but she declined...  PALPITATIONS>  She has hx atypCP w/ neg cath 1997 & neg Myoview2008; she is asked to avoid caffeine etc and take the Alprazolam regularly; she had Cards eval from DrHSmith & encouraged to f/u w/ him...  CHOL>  On diet alone & FLP is improved, continue  diet + exercise program...  GI>  Extensive GI evals in [place] & [facility] as noted> GERD, Hx stricture, Gastroparesis, Divertics, IBS, Hems> currently only taking Omep40mg /d w/ prev regimen outlined above; she is rec to f/u w/ her gastroenterologist at [facility] follow their recommendations (we don't have notes from them)...  RHEUM>  FM/ OA/ DDD lumbar spine, etc>  Followed by DrDeveshwar & recent eval did not shed light on  etiology of transiently elev sed rate; f/u Sed rates have been normal...  NEURO>  followed by DrWillis w/ hx memory disturbance, ?transient aphasia, prev MRI w/ lacunar infarcts in cerebellum & sm vessel dis> on ASA daily & rec to take Axona.  Anxiety & ?depression> on Alprazolam prn & she is asked to take this more regularly (~1/2 Tid);  her GYN DrCousins has given her Lexapro for depression...   Patient's Medications  New Prescriptions   No medications on file  Previous Medications   ASPIRIN (ECOTRIN) 325 MG EC TABLET    Take 325 mg by mouth daily.     DETROL LA 4 MG 24 HR CAPSULE    TAKE ONE CAPSULE BY MOUTH EVERY NIGHT AT BEDTIME   ESCITALOPRAM (LEXAPRO) 10 MG TABLET    Take 10 mg by mouth daily. As directed by Dr. Donald Pore 0.5 % OPHTHALMIC SUSPENSION    Take 1 tablet by mouth Daily.   METOPROLOL SUCCINATE (TOPROL-XL) 25 MG 24 HR TABLET    Take 25 mg by mouth daily.     OMEPRAZOLE (PRILOSEC) 40 MG CAPSULE    Take 1 capsule (40 mg total) by mouth daily.   OVER THE COUNTER MEDICATION    Take 1 tablet by mouth daily. Pt takes domperidone that she gets mail order from [place]    ZOLPIDEM (AMBIEN) 10 MG TABLET    Take 10 mg by mouth at bedtime as needed.    Modified Medications   Modified Medication Previous Medication   ALPRAZOLAM (XANAX) 0.5 MG TABLET ALPRAZolam (XANAX) 0.5 MG tablet      Take 1/2 to 1 tablet by mouth three times daily as needed for nerves    TAKE 1/2 TO 1 TABLET BY MOUTH THREE TIMES DAILY   ERGOCALCIFEROL (VITAMIN D2) 50000 UNITS CAPSULE ergocalciferol (VITAMIN D2) 50000 UNITS capsule      Take 1 capsule (50,000 Units total) by mouth once a week.    Take 1 capsule (50,000 Units total) by mouth once a week.  Discontinued Medications   MIRTAZAPINE (REMERON) 15 MG TABLET    Take 15 mg by mouth at bedtime.

## 2011-01-29 ENCOUNTER — Other Ambulatory Visit: Payer: Self-pay

## 2011-01-29 ENCOUNTER — Encounter (HOSPITAL_COMMUNITY): Payer: Self-pay | Admitting: Emergency Medicine

## 2011-01-29 ENCOUNTER — Emergency Department (HOSPITAL_COMMUNITY)
Admission: EM | Admit: 2011-01-29 | Discharge: 2011-01-29 | Disposition: A | Discharge status: Home / Self Care | Payer: Medicare Other | Attending: Emergency Medicine | Admitting: Emergency Medicine

## 2011-01-29 ENCOUNTER — Emergency Department (HOSPITAL_COMMUNITY): Payer: Medicare Other

## 2011-01-29 DIAGNOSIS — R4182 Altered mental status, unspecified: Secondary | ICD-10-CM | POA: Insufficient documentation

## 2011-01-29 DIAGNOSIS — Z8673 Personal history of transient ischemic attack (TIA), and cerebral infarction without residual deficits: Secondary | ICD-10-CM | POA: Insufficient documentation

## 2011-01-29 DIAGNOSIS — K219 Gastro-esophageal reflux disease without esophagitis: Secondary | ICD-10-CM | POA: Insufficient documentation

## 2011-01-29 DIAGNOSIS — F29 Unspecified psychosis not due to a substance or known physiological condition: Secondary | ICD-10-CM | POA: Insufficient documentation

## 2011-01-29 DIAGNOSIS — Z7982 Long term (current) use of aspirin: Secondary | ICD-10-CM | POA: Insufficient documentation

## 2011-01-29 DIAGNOSIS — R109 Unspecified abdominal pain: Secondary | ICD-10-CM | POA: Insufficient documentation

## 2011-01-29 DIAGNOSIS — K589 Irritable bowel syndrome without diarrhea: Secondary | ICD-10-CM | POA: Insufficient documentation

## 2011-01-29 DIAGNOSIS — Z79899 Other long term (current) drug therapy: Secondary | ICD-10-CM | POA: Insufficient documentation

## 2011-01-29 DIAGNOSIS — R197 Diarrhea, unspecified: Secondary | ICD-10-CM | POA: Insufficient documentation

## 2011-01-29 DIAGNOSIS — E7800 Pure hypercholesterolemia, unspecified: Secondary | ICD-10-CM | POA: Insufficient documentation

## 2011-01-29 DIAGNOSIS — N390 Urinary tract infection, site not specified: Secondary | ICD-10-CM

## 2011-01-29 LAB — COMPREHENSIVE METABOLIC PANEL
ALT: 9 U/L (ref 0–35)
AST: 15 U/L (ref 0–37)
Albumin: 3.5 g/dL (ref 3.5–5.2)
CO2: 29 mEq/L (ref 19–32)
Calcium: 9 mg/dL (ref 8.4–10.5)
Sodium: 139 mEq/L (ref 135–145)
Total Protein: 6.7 g/dL (ref 6.0–8.3)

## 2011-01-29 LAB — URINALYSIS, ROUTINE W REFLEX MICROSCOPIC
Glucose, UA: NEGATIVE mg/dL
Specific Gravity, Urine: 1.025 (ref 1.005–1.030)
pH: 6 (ref 5.0–8.0)

## 2011-01-29 LAB — URINE MICROSCOPIC-ADD ON

## 2011-01-29 LAB — CBC
MCH: 29.4 pg (ref 26.0–34.0)
Platelets: 156 10*3/uL (ref 150–400)
RBC: 4.11 MIL/uL (ref 3.87–5.11)

## 2011-01-29 MED ORDER — NITROFURANTOIN MONOHYD MACRO 100 MG PO CAPS
100.0000 mg | ORAL_CAPSULE | ORAL | Status: AC
  Administered 2011-01-29: 100 mg via ORAL
  Filled 2011-01-29: qty 1

## 2011-01-29 MED ORDER — NITROFURANTOIN MONOHYD MACRO 100 MG PO CAPS: 100.0000 mg | ORAL_CAPSULE | Freq: Two times a day (BID) | ORAL | Status: AC

## 2011-01-29 MED ORDER — NITROFURANTOIN MONOHYD MACRO 100 MG PO CAPS: 100.0000 mg | ORAL_CAPSULE | Freq: Two times a day (BID) | ORAL | Status: DC

## 2011-01-29 NOTE — ED Notes (Signed)
MD at bedside. 

## 2011-01-29 NOTE — ED Notes (Signed)
Patient is alert and oriented.  Verbalized understanding of discharge instructions.  Patient eating a sandwich prior to discharge

## 2011-01-29 NOTE — ED Notes (Signed)
Pt was on phone with daughter after taking an [place]. Pt was talking strange to daughter so daughter called ems. Pt has hx of tia's. Pt moving all extremities. No distress noted. No neuro deficits. Pt c/o intermittent headache for a few days. Pt c/o numbness to left foot.

## 2011-01-29 NOTE — ED Notes (Signed)
Patient is alert and oriented.  She recalls what happened tonight.  She states she has noted a funny feeling in the top of her head and in the posterior head for the past few days.  She also reports her left leg feels heavy.  Patient with no noted weakness on exam.  She reports increased sensation on the left side during stroke scale exam.  Patient with no diff with speech.  She has hx of ibs and reflux,  Reports she does have some diff with swallowing at times due to feeling like something gets stuck in her throat.  Patient states the sensation is worse tonight

## 2011-01-29 NOTE — ED Notes (Signed)
Patient up to bathroom w/o diff.  No noted altered gait

## 2011-01-29 NOTE — ED Provider Notes (Signed)
History     CSN: [id number] Arrival date & time: 01/29/2011  2:28 AM   First MD Initiated Contact with Patient 01/29/11 [phone]      Chief Complaint  Patient presents with  . Altered Mental Status    pt took evening ambien and was on phone with her daughter. daughter felt pt was altered and called ems.     (Consider location/radiation/quality/duration/timing/severity/associated sxs/prior treatment) HPI This is a 70 year old black female who took her usual evening medications yesterday, which include Ambien. She decided not to bed right away. Her daughter called her from [place] and that she was confused. Her daughter then talk to her father, the patient's husband, and advised him that the patient should be evaluated. On arrival to the ED nursing staff found the patient to be awake alert and oriented. The patient denies acute complaint. She has been having some vague discomfort in the lower abdomen when urinating. She also states that she has had some diarrhea recently. She's not aware of any fever but states she does alternate between feeling cold and feeling hot but this is chronic.  Past Medical History  Diagnosis Date  . Dysphonia   . Asthmatic bronchitis   . Chest pain, atypical   . Cerebrovascular disease   . Hypercholesteremia   . GERD (gastroesophageal reflux disease)   . Acute cystitis   . Esophageal stricture   . Gastroparesis   . Diverticulosis of colon   . IBS (irritable bowel syndrome)   . Hemorrhoids   . Urinary incontinence   . Hematuria, microscopic   . DJD (degenerative joint disease)   . Fibromyalgia   . Anxiety   . Vitamin D deficiency     Past Surgical History  Procedure Date  . Abdominal hysterectomy 1995  . Corneal transplant for keratonconus   . Right bunion surgery   . Left foot fracture with orif 07/2004    Dr. Lajoyce Corners    Family History  Problem Relation Age of Onset  . Breast cancer      aunt  . Diabetes      aunt,brother,uncle  . Kidney  disease      father    History  Substance Use Topics  . Smoking status: Never Smoker   . Smokeless tobacco: Never Used  . Alcohol Use: No    OB History    Grav Para Term Preterm Abortions TAB SAB Ect Mult Living                  Review of Systems  All other systems reviewed and are negative.    Allergies  Metoclopramide hcl; Morphine; Penicillins; Prednisone; and Sulfonamide derivatives  Home Medications   Current Outpatient Rx  Name Route Sig Dispense Refill  . ASPIRIN 325 MG PO TBEC Oral Take 325 mg by mouth daily.      Marland Kitchen DETROL LA 4 MG PO CP24  TAKE ONE CAPSULE BY MOUTH EVERY NIGHT AT BEDTIME 30 capsule 5  . ERGOCALCIFEROL 50000 UNITS PO CAPS Oral Take 1 capsule (50,000 Units total) by mouth once a week. 12 capsule 3  . ESCITALOPRAM OXALATE 10 MG PO TABS Oral Take 10 mg by mouth daily. As directed by Dr. Cherly Hensen     . LOTEMAX 0.5 % OP SUSP Oral Take 1 tablet by mouth Daily.    Marland Kitchen METOPROLOL SUCCINATE ER 25 MG PO TB24 Oral Take 25 mg by mouth daily.      Marland Kitchen OMEPRAZOLE 40 MG PO CPDR Oral Take 1  capsule (40 mg total) by mouth daily. 30 capsule 11  . ZOLPIDEM TARTRATE 10 MG PO TABS Oral Take 10 mg by mouth at bedtime as needed.      . ALPRAZOLAM 0.5 MG PO TABS  TAKE 1/2 TO 1 TABLET BY MOUTH THREE TIMES DAILY 100 tablet 5  . OVER THE COUNTER MEDICATION Oral Take 1 tablet by mouth daily. Pt takes domperidone that she gets mail order from [place]       BP 133/63  Pulse 55  Temp(Src) 97.8 F (36.6 C) (Oral)  Resp 17  SpO2 100%  Physical Exam General: Well-developed, well-nourished female in no acute distress; appearance consistent with age of record HENT: normocephalic, atraumatic Eyes: pupils equal round and reactive to light; extraocular muscles intact; surgical changes to corneas Neck: supple Heart: regular rate and rhythm; no murmurs Lungs: clear to auscultation bilaterally Abdomen: soft; mild suprapubic tenderness; nondistended; no masses or hepatosplenomegaly;  bowel sounds present Extremities: No deformity; full range of motion; pulses normal Neurologic: Awake, alert and oriented; motor function intact in all extremities and symmetric; no facial droop; normal speech and coordination Skin: Warm and dry Psychiatric: Normal mood and affect    ED Course  Procedures (including critical care time)   MDM  Suspect either reaction to Ambien or confusion due to urinary tract infection or combination of these. There is no evidence of acute stroke.   Nursing notes and vitals signs, including pulse oximetry, reviewed.  Summary of this visit's results, reviewed by myself:  Labs:  Results for orders placed during the hospital encounter of 01/29/11  CBC      Component Value Range   WBC 4.4  4.0 - 10.5 (K/uL)   RBC 4.11  3.87 - 5.11 (MIL/uL)   Hemoglobin 12.1  12.0 - 15.0 (g/dL)   HCT 95.6  21.3 - 08.6 (%)   MCV 90.0  78.0 - 100.0 (fL)   MCH 29.4  26.0 - 34.0 (pg)   MCHC 32.7  30.0 - 36.0 (g/dL)   RDW 57.8  46.9 - 62.9 (%)   Platelets 156  150 - 400 (K/uL)  COMPREHENSIVE METABOLIC PANEL      Component Value Range   Sodium 139  135 - 145 (mEq/L)   Potassium 3.7  3.5 - 5.1 (mEq/L)   Chloride 102  96 - 112 (mEq/L)   CO2 29  19 - 32 (mEq/L)   Glucose, Bld 102 (*) 70 - 99 (mg/dL)   BUN 16  6 - 23 (mg/dL)   Creatinine, Ser 5.28  0.50 - 1.10 (mg/dL)   Calcium 9.0  8.4 - 41.3 (mg/dL)   Total Protein 6.7  6.0 - 8.3 (g/dL)   Albumin 3.5  3.5 - 5.2 (g/dL)   AST 15  0 - 37 (U/L)   ALT 9  0 - 35 (U/L)   Alkaline Phosphatase 80  39 - 117 (U/L)   Total Bilirubin 0.2 (*) 0.3 - 1.2 (mg/dL)   GFR calc non Af Amer 57 (*) >90 (mL/min)   GFR calc Af Amer 66 (*) >90 (mL/min)  URINALYSIS, ROUTINE W REFLEX MICROSCOPIC      Component Value Range   Color, Urine YELLOW  YELLOW    APPearance CLOUDY (*) CLEAR    Specific Gravity, Urine 1.025  1.005 - 1.030    pH 6.0  5.0 - 8.0    Glucose, UA NEGATIVE  NEGATIVE (mg/dL)   Hgb urine dipstick MODERATE (*) NEGATIVE     Bilirubin Urine SMALL (*)  NEGATIVE    Ketones, ur NEGATIVE  NEGATIVE (mg/dL)   Protein, ur NEGATIVE  NEGATIVE (mg/dL)   Urobilinogen, UA 1.0  0.0 - 1.0 (mg/dL)   Nitrite NEGATIVE  NEGATIVE    Leukocytes, UA LARGE (*) NEGATIVE   URINE MICROSCOPIC-ADD ON      Component Value Range   Squamous Epithelial / LPF RARE  RARE    WBC, UA 7-10  <3 (WBC/hpf)   RBC / HPF 0-2  <3 (RBC/hpf)   Bacteria, UA RARE  RARE     Imaging Studies: Ct Head Wo Contrast  01/29/2011  *RADIOLOGY REPORT*  Clinical Data: Altered mental status and left-sided head, arm and leg tingling.  CT HEAD WITHOUT CONTRAST  Technique:  Contiguous axial images were obtained from the base of the skull through the vertex without contrast.  Comparison: CT of the head performed 10/18/2010  Findings: There is no evidence of acute infarction, mass lesion, or intra- or extra-axial hemorrhage on CT.  Mild periventricular white matter change likely reflects small vessel ischemic microangiopathy; this is somewhat more focal at the high right parietal lobe, stable from the prior study.  Small chronic lacunar infarcts are seen within the cerebellar hemispheres bilaterally.  The brainstem and fourth ventricle are within normal limits.  The third and lateral ventricles, and basal ganglia are unremarkable in appearance.  The cerebral hemispheres demonstrate grossly normal gray-white differentiation.  No mass effect or midline shift is seen.  There is no evidence of fracture; visualized osseous structures are unremarkable in appearance.  The visualized portions of the orbits are within normal limits.  The paranasal sinuses and mastoid air cells are well-aerated.  No significant soft tissue abnormalities are seen.  IMPRESSION:  1.  No acute intracranial pathology seen on CT. 2.  Mild periventricular white matter change likely reflects small vessel ischemic microangiopathy; this is more prominent at the high right parietal lobe, stable from the prior study. 3.   Small chronic lacunar infarcts within the cerebellar hemispheres bilaterally.  Original Report Authenticated By: Tonia Ghent, M.D.   EKG Interpretation:  Date & Time: 01/29/2011 4:45 AM  Rate: 58  Rhythm: sinus bradycardia  QRS Axis: normal  Intervals: normal  ST/T Wave abnormalities: normal  Conduction Disutrbances:none  Narrative Interpretation:   Old EKG Reviewed: Unchanged            Hanley Seamen, MD 01/29/11 [phone]

## 2011-01-30 LAB — URINE CULTURE

## 2011-02-11 ENCOUNTER — Other Ambulatory Visit: Payer: Self-pay | Admitting: *Deleted

## 2011-02-11 MED ORDER — ALPRAZOLAM 0.5 MG PO TABS: ORAL_TABLET | ORAL | Status: DC

## 2011-02-19 ENCOUNTER — Encounter: Payer: Self-pay | Admitting: Pulmonary Disease

## 2011-02-25 HISTORY — PX: EYE SURGERY: SHX253

## 2011-04-22 ENCOUNTER — Telehealth: Payer: Self-pay | Admitting: Pulmonary Disease

## 2011-04-22 NOTE — Telephone Encounter (Signed)
Spoke with pt. She states that yesterday evening she felt very light headed while showering and had to get out of the shower and go lie down. She states that she was at the point where she felt that she may pass out but lying down helped. She states that this am she still feels slightly light headed and now has "feeling in right side of head" - does not describe this as pain, but as "throbbing sensation"  She states unsure if needs to see someone about this or not, and wants recs from SN as to whether she should see neurologist or not. Please advise, thanks!

## 2011-04-22 NOTE — Telephone Encounter (Signed)
Per SN reduce metoprolol XL to 1/2 tablet every evening and if the symptoms persist then she needs to see her neurologists  LMOMTCB X1 FOR PT

## 2011-04-23 NOTE — Telephone Encounter (Signed)
I informed pt of SN's findings and recommendations. Pt verbalized understanding

## 2011-04-30 ENCOUNTER — Telehealth: Payer: Self-pay | Admitting: Pulmonary Disease

## 2011-04-30 NOTE — Telephone Encounter (Signed)
Called, spoke with pt.  I informed her SN can see her on march 21 at 3 pm.  Pt would like this appt, so I canceled the appt on March 27 -- pt aware of this.    Pt had further concerns about what to do if her symptoms worsen prior to this appt.  I offered pt OV with TP but she declined at this time.  I advised she can always call back to see if SN had cancellation, she can see Tammy P in the meantime, and if symptoms worsen advised to seek emergency care.   She verbalized understanding of this.

## 2011-04-30 NOTE — Telephone Encounter (Signed)
Per SN---she should see her GI doctor for a follow up---she will need to call and set this appt up at [facility]. thanks

## 2011-04-30 NOTE — Telephone Encounter (Signed)
Ok to add pt on the schedule for 3-21 at 3pm.  thanks

## 2011-04-30 NOTE — Telephone Encounter (Signed)
Spoke with pt. She states that for a month, she "just has not felt well". She states stomach feels bloated and "heavy". She had blood in her stool this am. Denies any constipation or diarrhea, but feels that her bowels are not emptying properly. She states that she has a GI doc in [facility] but he is hard to get appt with and so she is requesting recs from SN. I advised that there are no openings in his or TP's schedule today or tomorrow. Please advise, thanks!

## 2011-04-30 NOTE — Telephone Encounter (Signed)
Called spoke with patient, advised of SN's recs regarding following up with her GI physician.  Pt okay with these recs and verbalized her understanding.  However, pt does report having "a lot of other little issues" including pain in her left leg from the knee to the groin, some numbness and discomfort in her toes.  Pt is aware she has a 3.27.13 ov with SN for 4 month follow up.  Wants to be seen sooner and declined ov with TP.  No available appts.  Dr Kriste Basque please advise, thanks.

## 2011-05-06 ENCOUNTER — Other Ambulatory Visit: Payer: Self-pay | Admitting: Pulmonary Disease

## 2011-05-15 ENCOUNTER — Encounter: Payer: Self-pay | Admitting: Pulmonary Disease

## 2011-05-15 ENCOUNTER — Ambulatory Visit (INDEPENDENT_AMBULATORY_CARE_PROVIDER_SITE_OTHER): Payer: Medicare Other | Admitting: Pulmonary Disease

## 2011-05-15 VITALS — BP 122/72 | HR 64 | Temp 97.9°F | Ht 61.0 in | Wt 128.0 lb

## 2011-05-15 DIAGNOSIS — K589 Irritable bowel syndrome without diarrhea: Secondary | ICD-10-CM

## 2011-05-15 DIAGNOSIS — I679 Cerebrovascular disease, unspecified: Secondary | ICD-10-CM

## 2011-05-15 DIAGNOSIS — R200 Anesthesia of skin: Secondary | ICD-10-CM

## 2011-05-15 DIAGNOSIS — IMO0001 Reserved for inherently not codable concepts without codable children: Secondary | ICD-10-CM

## 2011-05-15 DIAGNOSIS — R0789 Other chest pain: Secondary | ICD-10-CM

## 2011-05-15 DIAGNOSIS — F411 Generalized anxiety disorder: Secondary | ICD-10-CM

## 2011-05-15 DIAGNOSIS — K3184 Gastroparesis: Secondary | ICD-10-CM

## 2011-05-15 DIAGNOSIS — M1990 Unspecified osteoarthritis, unspecified site: Secondary | ICD-10-CM

## 2011-05-15 DIAGNOSIS — K219 Gastro-esophageal reflux disease without esophagitis: Secondary | ICD-10-CM

## 2011-05-15 DIAGNOSIS — E7800 Pure hypercholesterolemia, unspecified: Secondary | ICD-10-CM

## 2011-05-15 DIAGNOSIS — K5730 Diverticulosis of large intestine without perforation or abscess without bleeding: Secondary | ICD-10-CM

## 2011-05-15 MED ORDER — METOPROLOL SUCCINATE ER 25 MG PO TB24: ORAL_TABLET | ORAL | Status: DC

## 2011-05-15 NOTE — Patient Instructions (Signed)
Today we updated your med list in our EPIC system...    Continue your current medications the same...  We will arrange for a neurology consultation w/ DrWillis regarding your left leg numbness which seems to be getting worse...  At your convenience> you should set up a follow up w/ your rheumatologist, DrDeveshwar...  Call for any questions...  Let's plan a follow up visit in 6 months.Marland KitchenMarland Kitchen

## 2011-05-15 NOTE — Progress Notes (Addendum)
Subjective:    Patient ID: Paige Pena, female    DOB: 05-29-1940, 71 y.o.   MRN: [id number]  HPI 71 y/o BF here for a follow up visit... she has multiple medical problems as noted below... Her daughter is a Publishing rights manager.  ~  May 23, 2009:  she has had GI evals from DrStark, and DrOrman at [facility] w/ IBS, bloating, delayed gastric emptying, globus sensation, fecal incontinence (their notes are reviewed w/ pt)> last note DrOrman 11/10- f/u mult GI complaints; they tried biofeedback, Zegerid, Domperidone 10mg Bid, Remeron Qhs, & ENT referral (Voice therapy)...  she switched the Zegerid to Omep40mg  for $$ reasons; asks that we send copy of colonoscopy to DrOrman; recently seen by GYN= DrCousins and started on LEXAPRO 10mg /d... she is quite anxious & not using her Alprazolam regularly & we discussed this...  ~  January 15, 2010:  she returns w/ mult somatic complaints> HA in left temple area, SOB- can't get a DB, pressure in abd, occas ankle pain, etc... she has seen DrDarling & Orman at [facility] w/ Dx of IBS, delayed gastric emptying, nausea, bloating, fecal incontinence- they wanted her to continue Remeron for sleep, Domperidone for delayed gastric emptying, & gave her a course of Rifaximin for the bloating... she also saw DrBuckmire [facility] for hoarseness w/ dysphonia, vocal fold weakness, & an epithelial lesion that was biopsied & benign... she also saw DrWillis for Neuro 7/11 w/ c/o memory problems & he did labs/ MRI Brain/ EEG> reported to be unremarkable...  ~  Jul 15, 2010:  70mo ROV & she continues to have mult complaints that seem revolve around 3 systems> GI, Neuro, & Rheum/Ortho:  GI problems as noted above w/ eval & rx from [facility] w/ referral to ENT as well w/ eval DrBuckmire & she says they saw something in her throat & are planning a CT scan for further eval;  Neuro eval DrWillis for memory & ?transient aphasia (exam was neg & rec to take ASA 81mg /d & given Axona dietary product);  She  has seen DrDeveshwar in the past for prob FM, OA, & DDD in lumbar spine> labs today showed elev sed rate (61) ?etiology & she is requested to f/u w/ Rheum for further investigation ==> (DrDeveshwar scared her & told her she might need an Oncology referral for the elev sed rate)...  ~  September 16, 2010:  65mo ROV & she is c/o SOB, can't get a deep breath, & occas nocturnal palpit; she remains quite anxious about her health & we discussed taking the Alpraz "combination relaxer" 1/2 Tid regularly;  She would like to see DrHSmith for Cards evaluation and reassurance about her cardiac status...    She has continued her evaluations & f/u at [facility] (ENT- DrBuckmire) w/ bilat pyriform plaques (r/o candida & rx w/ Nystatin oral) & vocal tremor; she continues on PPI rx for LER; they did CT scan & pt reports that this was neg; they rec voice therapy but she declined...    Note from [facility] 5/12 notes FM hx, incr fatigue, drowsy, denies joint swelling/ muscle tenderness/ or weakness; exam +for tender points & generalized pain; given Ambien for sleep; she repeated labs & Sed=34, ANA neg, SPE w/o Mspike; pt states that DrDeveshwar referred her back here to evaluate the Sed rate ("she doesn't do that") & mentioned that she might need to see an oncologist & this really concerned the pt;  We discussed this & decided on further f/u labs at this time (Sed  improved 61==>16)...  ~  January 22, 2011:  767mo ROV & she is unchanged clinically> not feeling well, c/o stomach problems, sluggish, etc... She went to the ER 8/12 w/ "palpit & memory loss", exam neg & no neuro deficits, Labs ok, CT Brain neg w/o acute changes & just some sm vessel dis, given Ativan & advised f/u primary MD... She has seen DrWillis in past for Neuro> he rec Axona but she wouldn't take it preferring a brain supplement she saw on TV...    She had Cards f/u DrHSmith 9/12> c/o palpit, he noted neg prior cardiac work up w/o ischemia on Myoview 2008 & no CAD on  cath 1997; he noted normal EKG & ordered Holter monitor    She had GI f/u DrBarritt & Orman at [facility] 9/12> IBS, delayed gastric emptying, nausea, bloating, fecal incont> on Omep40 & Domperidone 10mg Tid; they suspected sm bowel bact overgrowth that responded prev to rifaximin (Xifaxan Rx written) & consider anorectal biofeedback; she was quite anxious & wanted to know when her next Colonoscopy was due (last 2006 was neg & f/u due 2016)...  ~  May 15, 2011:  767mo ROV & here for urgent add-on appt  For the same old symptoms it appears> c/o not feeling well, bloating, blood in stool, not emptying her bowels well, and "alot of little issues like painin left leg from knee to groin, numbness in left foot, light headed, & throbbing in her head... Review of interval EPIC records shows ER visit 12/12 w/ UTI (mult species), and she's had 4 CT scans of her head + MRI/MRA all in the last year... She is most concerned about the left foot numbness which she feels is getting worse & wants a Neurology appt w/ DrWillis for further eval...    Hx AB> states she occ "feels tight" in chest, comes & goes, esp lying down at night, "it whistles & pushes itself out"; I rec that she takes her Xanax each eve; states DrDeveshwar put her on Ambien Qhs for her FM & she takes 1/2...    Hx AtypCP & palpit> on ToprolXL25- 1/2 tab daily> BP= 122/72 today & she denies any recent CP, palpit, ch in SOB, edema...    Cerebrovasc dis> on ASA 325mg /d; hx remote lacunar infarcts & known sm vessel dis; no acute cerebral ischemic symptoms...    Chol> on diet alone & reasonably well controlled; see labs below...    GI> followed by DrStark here, DrOrman at [facility] GERD, LER, Gastroparesis, IBS, bloating, globus, fecal incont; on Omep40, Domperidone10Qid, & recently given another course of Xifaxan by DrOrman for bloating & poss bact overgrowth; she tells me they are planning another colonoscopy soon.          Problem List:    Hx of DYSPHONIA (MWN-027.25)  - She has seen ENT- DrTeoh on several occas w/ cerumen impactions, sensorineural hearing loss, dysphonia & globus phenomenon (the latter felt secondary to GERD)... laryngoscopy was neg... she was maintained on Zegerid 40mg  Bid & switched to OMEPRAZOLE 40mg /d for $$ reasons... ~  2010:  referred by [facility] to their ENT Dept for voice therapy w/ eval by DrBuckmire- dysphonia & vocal fold weakness, plus an epithelial lesion that was biopsied & benign... ~  2012:  Further eval by [facility] GI [facility] ENT Depts> bilat pyriform plaques (r/o candida & rx w/ Nystatin oral- resolved) & vocal tremor; she continues on PPI rx for LER; they did CT scan & pt reports that this was  neg; they rec voice therapy but she declined... ~  11/12: she reports that voice, throat are better; rec to continue the PPI & antireflux regimen...  R/O SLEEP APNEA (ICD-780.57) - sleep study 4/08 showed RDI= 6 & some restless leg symptoms (tried Requip)... DrDeveshwar has tried Provigil for Idiopathic Hypersomnolence...  Hx of ASTHMATIC BRONCHITIS, ACUTE (ICD-466.0) - no recent URIs reported... ~  CXR 1/12 showed norm heart size, tort aorta, clear lungs, DJD Tspine & mild scoliosis...  Hx of ATYPICAL CHEST PAIN, & PALPITATIONS >> Cardiac eval by DrHSmith... ~  cardiac cath 1997 showed normal coronaries ~  2DEcho 9/03 showed mild conc LVH, norm EF, mild AoV sclerosis, mild thickening MV leaflets, mild MR... ~  NuclearStressTest 11/08 was WNL- no ischemia, no infarct, EF= 79%... ~  7/12:  She requests cardiac eval from DrHSmith for reassurance regarding palpitation; seen 9/12 w/ norm EKG & Holter done (we don't have results)...  CEREBROVASCULAR DISEASE (ICD-437.9) - on ASA 325mg /d... she was eval by DrStiefel for yrs ago & DrWillis more recently... ~  MRI Brain 6/06 showed remote lacunar infarcts in the cerebellum bilaterally, chr small vessel dis, NAD... ~  2011:  Neuro eval by DrWillis for memory disturbance & report of transient  aphasia> we don't have the scan report but his note indicates MRI relatively unremarkable & EEG was WNL; rec ASA & Axona (she never tried it). ~  Seen in ER 8/12 w/ palpit & memory loss> CT Brain was neg x sm vessel dis...  HYPERCHOLESTEROLEMIA (ICD-272.0)0 - on diet alone... ~  FLP 11/08 showed TChol 212, TG 57, HDL 68, LDL 132 ~  FLP 1/10 showed TChol 207, TG 62, HDL 64, LDL 127... she prefers to continue diet management. ~  FLP 3/11 showed TChol 238, TG 61, HDL 71, LDL 154... she is reluctant to consider Statin Rx due to side effects. ~  FLP 11/11 showed TChol 221, TG 54, HDL 66, LDL 129 ~  FLP 5/12 showed TChol 171, TG 56, HDL 59, LDL 101... rec to continue diet & exercise program...  GERD (ICD-530.81), & ESOPHAGEAL STRICTURE (ICD-530.3) - on OMEPRAZOLE 40mg /d now + off prev Carafate Rx... ~  EGD 7/07 by DrStark showed GERD & stricture- dilated... ~  pt seen by GI/ DrStark Jun09- outlined plans for Rx w/ Zegerid, Domperidone, Carafate... ~  2010: she had 2nd opinion consult [facility] DrOrman & Piedad Climes- mult GI complaints w/ IBS, delayed gastric emptying, hx dysphagia, globus sensation, & fecal incontinence... treated w/ PPI, Dromperidone, Remeron, & Voice therapy w/ referral to ENT Dept. ~  2011:  Further eval & testing at [facility] (we don't have records from GI)... ~  9/12:  She had f/u [facility] DrBarritt & Orman> no changes made, they rec Ativan Tid...  GASTROPARESIS (ICD-536.3) - tried REGLAN- discontinued due to reaction w/ anxiety... DrStark added DOMPERIDONE 10mg  Qid & she still takes this... ~  Gastric Emptying Scan 10/08 showed 93% activity remaining in the stomach at 60 min, and 75% remaining at 2H (normal is <30% at [facility])...  DIVERTICULOSIS OF COLON (ICD-562.10),  IRRITABLE BOWEL SYNDROME (ICD-564.1), & HEMORRHOIDS (ICD-455.6) -  ~  CT Abd & Pelvis in 2001 was WNL... ~  colonoscopy 9/06 by DrStark showed divertics and int hems... she had spasm in the sigmoid region.  URINARY INCONTINENCE  (ICD-788.30) - on DETROL LA 4mg /d...  HEMATURIA, MICROSCOPIC, HX OF (ICD-V13.09)  DEGENERATIVE JOINT DISEASE (ICD-715.90) - she has some DDD in lumbar spine and osteoarthritis in knees... ~  5/12:  She called w/ leg discomfort & some subjective swelling on left side> VenDopplers were neg/ normal bilaterally...  FIBROMYALGIA (ICD-729.1) - followed by DrDeveshwar for Rheum... c/o insomnia, fatigue, fogginess, and mult somatic complaints "I stay tired"... tried Ambien, Provigil, etc... ~  5/12:  She has mult somatic complaints; routine lab showed Sed rate= 61 ?etiology & she is referred back to [facility] for further eval... ~  7/12:  DrDeveshwar referred her back here & serial Sed rates= 61, 34, 16 (now wnl)...  NEUROLOGY>> see above Cerebrovasc Dis ~  She's had recurrent & long standing complaints of poor memory & has seen DrStiefel & DrWillis... ~  3/13: c/o numbness in left foot & toes w/ pain in left leg from knee to groin; she is concerned & wants further eval- referred to Neuro.  ANXIETY (ICD-300.00) - prev tried on ALPRAZOLAM 0.5mg - 1/2 to 1 tab three times a day as needed (but she rarely takes it)... DrDarling at [facility] 15mg  for sleep (no longer taking this)... DrCousins, GYN Rx w/ Lexapro (pt says she stopped this med on her own).  VITAMIN D DEFICIENCY (ICD-268.9) - on Vit D 50K weekly... ~  Vit D level 1/10 = 15 (30-90) therefore started on Vit D 50,000 u weekly... ~  labs 3/11 showed Vit D level = 39 ~  labs 11/11 showed Vit D level = 59... continue 50K weekly at her request..  HEALTH MAINTENANCE>  GYN= DrCousins w/ PAP Q69yrs, seen 3/11 & started on Lexapro...  Mammograms at [facility]- done in 2009 neg x cysts... BMD at [facility] ~70yrs ago per pt (we only have copy of BMD from 2002= WNL w/ pos TScores)...   Past Surgical History  Procedure Date  . Abdominal hysterectomy 1995  . Corneal transplant for keratonconus   . Right bunion surgery   . Left foot fracture with orif 07/2004     Dr. Lajoyce Corners    Outpatient Encounter Prescriptions as of 05/15/2011  Medication Sig Dispense Refill  . ALPRAZolam (XANAX) 0.5 MG tablet Take 1/2 to 1 tablet by mouth three times daily as needed for nerves  90 tablet  0  . aspirin (ECOTRIN) 325 MG EC tablet Take 325 mg by mouth daily.        Marland Kitchen DETROL LA 4 MG 24 hr capsule TAKE ONE CAPSULE BY MOUTH EVERY NIGHT AT BEDTIME  30 capsule  5  . ergocalciferol (VITAMIN D2) 50000 UNITS capsule Take 1 capsule (50,000 Units total) by mouth once a week.  12 capsule  3  . escitalopram (LEXAPRO) 10 MG tablet Take 10 mg by mouth daily. As directed by Dr. Cherly Hensen      . fish oil-omega-3 fatty acids 1000 MG capsule Take 2 g by mouth daily.      . Flavoring Agent (BLUEBERRY FLAVOR) POWD by Does not apply route. Put 1 tsp in 10 oz of liquid daily      . LOTEMAX 0.5 % ophthalmic suspension Place 1 drop into both eyes Daily.       . metoprolol succinate (TOPROL-XL) 25 MG 24 hr tablet Take 25 mg by mouth daily. Takes 1/2 tablet daily      . omeprazole (PRILOSEC) 40 MG capsule Take 1 capsule (40 mg total) by mouth daily.  30 capsule  11  . OVER THE COUNTER MEDICATION Take 1 tablet by mouth daily. Pt takes domperidone that she gets mail order from [place]       . zolpidem (AMBIEN) 10 MG tablet Take 10 mg  by mouth at bedtime as needed. Takes 1/2 tablet prn sleep        Allergies  Allergen Reactions  . Metoclopramide Hcl     REACTION: anxious and out of her mind  . Morphine     REACTION: lethargy/malaise  . Penicillins     Rash, itching  . Prednisone     REACTION: rash/hives  . Sulfonamide Derivatives     REACTION: itching    Review of Systems         See HPI - all other systems neg except as noted... The patient complains of hoarseness, dyspnea on exertion, abdominal discomfort, gas/bloating, and muscle weakness.  The patient denies anorexia, fever, weight loss, weight gain, vision loss, decreased hearing, chest pain, syncope, peripheral edema, prolonged  cough, headaches, hemoptysis, melena, hematochezia, severe indigestion/heartburn, hematuria, incontinence, suspicious skin lesions, transient blindness, difficulty walking, depression, unusual weight change, abnormal bleeding, enlarged lymph nodes, and angioedema.     Objective:   Physical Exam     WD, WN, 71 y/o BF in NAD, she appears anxious & has mult +trigger points. GENERAL:  Alert & oriented; pleasant & cooperative... HEENT:  /AT, EOM-wnl, PERRLA, EACs-clear, TMs-wnl, NOSE-clear, THROAT-clear & wnl. NECK:  Supple w/ fairROM; no JVD; normal carotid impulses w/o bruits; no thyromegaly or nodules palpated; no lymphadenopathy. CHEST:  Clear to P & A; without wheezes/ rales/ or rhonchi heard... HEART:  Regular Rhythm; without murmurs/ rubs/ or gallops detected... ABDOMEN:  Soft & only min tender in mid-abd; normal bowel sounds; no organomegaly or masses palpated... EXT: without deformities, mild arthritic changes; no varicose veins/ venous insuffic/ or edema. NEURO:  CN's intact; motor testing normal; sensory testing normal; gait normal & balance OK. DERM:  no lesions seen...  RADIOLOGY DATA:  Reviewed in the EPIC EMR & discussed w/ the patient...  LABORATORY DATA:  Reviewed in the EPIC EMR & discussed w/ the patient...   Assessment & Plan:   Mult somatic complaints referable to the GI, Rheum, & Neuro systems; these are on-going, occ fluctuate, & seem unrelated...  DYSPHONIA>  ENT eval at [facility] is on-going & she is on antireflux regimen & PPI; rec to continue regular f/u w/ ENT & follow their recommendations; treated w/ Nystatin for candida & resolved; CT scan is reported neg; they suggested voice therapy but she declined...  PALPITATIONS>  She has hx atypCP w/ neg cath 1997 & neg Myoview2008; she is asked to avoid caffeine etc and take the Alprazolam regularly; she had Cards eval from DrHSmith & encouraged to f/u w/ him...  CHOL>  On diet alone & FLP is improved, continue diet +  exercise program...  GI>  Extensive GI evals in [place] & [facility] as noted> GERD, Hx stricture, Gastroparesis, Divertics, IBS, Hems> currently only taking Omep40mg /d w/ prev regimen outlined above; she is rec to f/u w/ her gastroenterologist at [facility] follow their recommendations (we don't have notes from them)...  RHEUM>  FM/ OA/ DDD lumbar spine, etc>  Followed by DrDeveshwar & recent eval did not shed light on etiology of transiently elev sed rate; f/u Sed rates have been normal...  NEURO>  followed by DrWillis w/ hx memory disturbance, ?transient aphasia, prev MRI w/ lacunar infarcts in cerebellum & sm vessel dis> on ASA daily & she never tried the Axona; now c/o paresthesias in left foot & wants Neuro recheck, denies LBP etc...  Anxiety & ?depression> on Alprazolam prn & she is asked to take this more regularly (~1/2 Tid);  her GYN  DrCousins has given her Lexapro for depression...   Patient's Medications  New Prescriptions   No medications on file  Previous Medications   ALPRAZOLAM (XANAX) 0.5 MG TABLET    Take 1/2 to 1 tablet by mouth three times daily as needed for nerves   ASPIRIN (ECOTRIN) 325 MG EC TABLET    Take 325 mg by mouth daily.     DETROL LA 4 MG 24 HR CAPSULE    TAKE ONE CAPSULE BY MOUTH EVERY NIGHT AT BEDTIME   ERGOCALCIFEROL (VITAMIN D2) 50000 UNITS CAPSULE    Take 1 capsule (50,000 Units total) by mouth once a week.   ESCITALOPRAM (LEXAPRO) 10 MG TABLET    Take 10 mg by mouth daily. As directed by Dr. Cherly Hensen  She has stopped this med   FISH OIL-OMEGA-3 FATTY ACIDS 1000 MG CAPSULE    Take 2 g by mouth daily.   FLAVORING AGENT (BLUEBERRY FLAVOR) POWD    Put 1 tsp in 10 oz of liquid daily   LOTEMAX 0.5 % OPHTHALMIC SUSPENSION    Place 1 drop into both eyes Daily.    OMEPRAZOLE (PRILOSEC) 40 MG CAPSULE    Take 1 capsule (40 mg total) by mouth daily.   OVER THE COUNTER MEDICATION    DOMPERIDONE 10mg  Qid    Take 1 tablet by mouth daily. Pt takes domperidone that she gets mail order  from [place]    RIFAXIMIN (XIFAXAN) 200 MG TABLET    Take 200 mg by mouth 3 (three) times daily.   ZOLPIDEM (AMBIEN) 10 MG TABLET    Take 10 mg by mouth at bedtime as needed. Takes 1/2 tablet prn sleep  Modified Medications   Modified Medication Previous Medication   METOPROLOL SUCCINATE (TOPROL-XL) 25 MG 24 HR TABLET metoprolol succinate (TOPROL-XL) 25 MG 24 hr tablet      TAKE 1/2 TABLET DAILY    Take 25 mg by mouth daily.  Discontinued Medications   No medications on file

## 2011-05-21 ENCOUNTER — Ambulatory Visit: Payer: Medicare Other | Admitting: Pulmonary Disease

## 2011-08-02 ENCOUNTER — Other Ambulatory Visit: Payer: Self-pay | Admitting: Pulmonary Disease

## 2011-08-11 ENCOUNTER — Other Ambulatory Visit: Payer: Self-pay | Admitting: Pulmonary Disease

## 2011-08-11 ENCOUNTER — Telehealth: Payer: Self-pay | Admitting: Pulmonary Disease

## 2011-08-11 DIAGNOSIS — R21 Rash and other nonspecific skin eruption: Secondary | ICD-10-CM

## 2011-08-11 NOTE — Telephone Encounter (Signed)
Spoke with pt and notified order for derm eval was placed. She verbalized understanding and states nothing further needed.

## 2011-08-11 NOTE — Telephone Encounter (Signed)
Called and spoke with pt and she stated that she has been seen for this before and it is not related to anything that she eats.  She stated that she can go out in the sun and these red bumps appear on her chest and arms.  She has tried benadryl and cortisone cream without relief.  She stated that once she is out of the sun these red bumps will go in but you can still see where they were and the skin still tingles all over.  Pt stated that she is going to [place] on Thursday and would like something call in for this.  SN please advise.   Thanks  Allergies  Allergen Reactions  . Metoclopramide Hcl     REACTION: anxious and out of her mind  . Morphine     REACTION: lethargy/malaise  . Penicillins     Rash, itching  . Prednisone     REACTION: rash/hives  . Sulfonamide Derivatives     REACTION: itching

## 2011-08-11 NOTE — Telephone Encounter (Signed)
Per SN--pt will need appt with Dermatology---order has been placed for this--thanks

## 2011-08-12 ENCOUNTER — Telehealth: Payer: Self-pay | Admitting: Pulmonary Disease

## 2011-08-14 NOTE — Telephone Encounter (Signed)
LMTCB x 1 

## 2011-08-15 ENCOUNTER — Other Ambulatory Visit: Payer: Self-pay | Admitting: Pulmonary Disease

## 2011-08-15 MED ORDER — OMEPRAZOLE 40 MG PO CPDR: 40.0000 mg | DELAYED_RELEASE_CAPSULE | Freq: Every day | ORAL | Status: DC

## 2011-08-15 NOTE — Telephone Encounter (Signed)
[email or web address]  

## 2011-08-18 NOTE — Telephone Encounter (Signed)
Called, spoke with pt who states this has already been taken care of.  Nothing further needed at this time.

## 2011-09-25 DIAGNOSIS — R000 Tachycardia, unspecified: Secondary | ICD-10-CM | POA: Insufficient documentation

## 2011-09-26 ENCOUNTER — Telehealth: Payer: Self-pay | Admitting: Pulmonary Disease

## 2011-09-26 MED ORDER — LOSARTAN POTASSIUM 50 MG PO TABS: 50.0000 mg | ORAL_TABLET | Freq: Every day | ORAL | Status: DC

## 2011-09-26 NOTE — Telephone Encounter (Signed)
Per SN he shares her concern regarding BP: suggest adding losartan 50 mg QD #30. Alternatively if she doesn't like this then he suggests she could call her cardiologists--Dr. Katrinka Blazing for his suggestions.  I spoke with pt and is aware of SN response. She states she would like rx called in for her (this has been done)

## 2011-09-26 NOTE — Telephone Encounter (Signed)
I spoke with the pt and she states on Wed she was having fatigue and a mild headache and overall didn't feel well. She states she went to pre-op appt at [facility] yesterday and her BP was elevated per the pt the reading was 150/86. Pt states this is unusual for her. Pt states today she is still fatigued, has a mild headache, and is also now c/o having pain in her left leg at the groin area. She denies any redness, heat, or a knot in this area. Pt is concerned that all of this has to do with her elevated BP. Pt denies any chest pain, edema, palpitations, vision changes at this time. Please advise.Carron Curie, CMA. Allergies  Allergen Reactions  . Metoclopramide Hcl     REACTION: anxious and out of her mind  . Morphine     REACTION: lethargy/malaise  . Penicillins     Rash, itching  . Prednisone     REACTION: rash/hives  . Sulfonamide Derivatives     REACTION: itching

## 2011-09-29 ENCOUNTER — Ambulatory Visit (INDEPENDENT_AMBULATORY_CARE_PROVIDER_SITE_OTHER): Payer: Medicare Other | Admitting: Adult Health

## 2011-09-29 ENCOUNTER — Other Ambulatory Visit (INDEPENDENT_AMBULATORY_CARE_PROVIDER_SITE_OTHER): Payer: Medicare Other

## 2011-09-29 VITALS — BP 126/70 | HR 76 | Temp 97.7°F | Ht 60.0 in | Wt 130.6 lb

## 2011-09-29 DIAGNOSIS — R030 Elevated blood-pressure reading, without diagnosis of hypertension: Secondary | ICD-10-CM | POA: Insufficient documentation

## 2011-09-29 DIAGNOSIS — R002 Palpitations: Secondary | ICD-10-CM

## 2011-09-29 DIAGNOSIS — F411 Generalized anxiety disorder: Secondary | ICD-10-CM

## 2011-09-29 DIAGNOSIS — K219 Gastro-esophageal reflux disease without esophagitis: Secondary | ICD-10-CM

## 2011-09-29 LAB — BASIC METABOLIC PANEL
CO2: 29 mEq/L (ref 19–32)
Calcium: 9.4 mg/dL (ref 8.4–10.5)
Creatinine, Ser: 1 mg/dL (ref 0.4–1.2)
Glucose, Bld: 76 mg/dL (ref 70–99)
Sodium: 139 mEq/L (ref 135–145)

## 2011-09-29 LAB — CBC WITH DIFFERENTIAL/PLATELET
Eosinophils Relative: 3.6 % (ref 0.0–5.0)
Lymphocytes Relative: 44.5 % (ref 12.0–46.0)
Monocytes Relative: 11.3 % (ref 3.0–12.0)
Neutrophils Relative %: 39.6 % — ABNORMAL LOW (ref 43.0–77.0)
Platelets: 140 10*3/uL — ABNORMAL LOW (ref 150.0–400.0)
WBC: 3.8 10*3/uL — ABNORMAL LOW (ref 4.5–10.5)

## 2011-09-29 LAB — HEPATIC FUNCTION PANEL
ALT: 13 U/L (ref 0–35)
AST: 21 U/L (ref 0–37)
Bilirubin, Direct: 0 mg/dL (ref 0.0–0.3)
Total Bilirubin: 0.7 mg/dL (ref 0.3–1.2)

## 2011-09-29 MED ORDER — ZOLPIDEM TARTRATE 10 MG PO TABS: ORAL_TABLET | ORAL | Status: DC

## 2011-09-29 NOTE — Patient Instructions (Addendum)
Low salt diet .  Check Blood pressures 3 times weekly in am -keep  log  Call if b/p > 150 on consistent basis  Please contact office for sooner follow up if symptoms do not improve or worsen or seek emergency care  Follow up Dr. Kriste Basque  In 6- 8 weeks

## 2011-09-29 NOTE — Assessment & Plan Note (Signed)
Isolated elevated b/p reading at preop eye visit.  B/p has been controlled on diet alone w/ no hx of HTN  Will have her to keep log,  Check labs  Advised healthy lifesytles measures Avoid nsaids/decongestants.

## 2011-09-29 NOTE — Progress Notes (Signed)
Subjective:    Patient ID: Paige Pena, female    DOB: 05/24/1940, 71 y.o.   MRN: [id number]  HPI 71 y/o BF   ~  May 23, 2009:  she has had GI evals from DrStark, and DrOrman at [facility] w/ IBS, bloating, delayed gastric emptying, globus sensation, fecal incontinence (their notes are reviewed w/ pt)> last note DrOrman 11/10- f/u mult GI complaints; they tried biofeedback, Zegerid, Domperidone 10mg Bid, Remeron Qhs, & ENT referral (Voice therapy)...  she switched the Zegerid to [facility]  for $$ reasons; asks that we send copy of colonoscopy to DrOrman; recently seen by GYN= DrCousins and started on LEXAPRO 10mg /d... she is quite anxious & not using her Alprazolam regularly & we discussed this...  ~  January 15, 2010:  she returns w/ mult somatic complaints> HA in left temple area, SOB- can't get a DB, pressure in abd, occas ankle pain, etc... she has seen DrDarling & Orman at [facility] w/ Dx of IBS, delayed gastric emptying, nausea, bloating, fecal incontinence- they wanted her to continue Remeron for sleep, Domperidone for delayed gastric emptying, & gave her a course of Rifaximin for the bloating... she also saw DrBuckmire [facility] for hoarseness w/ dysphonia, vocal fold weakness, & an epithelial lesion that was biopsied & benign... she also saw DrWillis for Neuro 7/11 w/ c/o memory problems & he did labs/ MRI Brain/ EEG> reported to be unremarkable...  ~  Jul 15, 2010:  87mo ROV & she continues to have mult complaints that seem revolve around 3 systems> GI, Neuro, & Rheum/Ortho:  GI problems as noted above w/ eval & rx from [facility] w/ referral to ENT as well w/ eval DrBuckmire & she says they saw something in her throat & are planning a CT scan for further eval;  Neuro eval DrWillis for memory & ?transient aphasia (exam was neg & rec to take ASA 81mg /d & given Axona dietary product);  She has seen DrDeveshwar in the past for prob FM, OA, & DDD in lumbar spine> labs today showed elev sed rate (61) ?etiology &  she is requested to f/u w/ Rheum for further investigation ==> (DrDeveshwar scared her & told her she might need an Oncology referral for the elev sed rate)...  ~  September 16, 2010:  21mo ROV & she is c/o SOB, can't get a deep breath, & occas nocturnal palpit; she remains quite anxious about her health & we discussed taking the Alpraz "combination relaxer" 1/2 Tid regularly;  She would like to see DrHSmith for Cards evaluation and reassurance about her cardiac status...    She has continued her evaluations & f/u at [facility] (ENT- DrBuckmire) w/ bilat pyriform plaques (r/o candida & rx w/ Nystatin oral) & vocal tremor; she continues on PPI rx for LER; they did CT scan & pt reports that this was neg; they rec voice therapy but she declined...    Note from [facility] 5/12 notes FM hx, incr fatigue, drowsy, denies joint swelling/ muscle tenderness/ or weakness; exam +for tender points & generalized pain; given Ambien for sleep; she repeated labs & Sed=34, ANA neg, SPE w/o Mspike; pt states that DrDeveshwar referred her back here to evaluate the Sed rate ("she doesn't do that") & mentioned that she might need to see an oncologist & this really concerned the pt;  We discussed this & decided on further f/u labs at this time (Sed improved 61==>16)...  ~  January 22, 2011:  64mo ROV & she is unchanged clinically> not feeling well,  c/o stomach problems, sluggish, etc... She went to the ER 8/12 w/ "palpit & memory loss", exam neg & no neuro deficits, Labs ok, CT Brain neg w/o acute changes & just some sm vessel dis, given Ativan & advised f/u primary MD... She has seen DrWillis in past for Neuro> he rec Axona but she wouldn't take it preferring a brain supplement she saw on TV...    She had Cards f/u DrHSmith 9/12> c/o palpit, he noted neg prior cardiac work up w/o ischemia on Myoview 2008 & no CAD on cath 1997; he noted normal EKG & ordered Holter monitor    She had GI f/u DrBarritt & Orman at [facility] 9/12> IBS, delayed  gastric emptying, nausea, bloating, fecal incont> on Omep40 & Domperidone 10mg Tid; they suspected sm bowel bact overgrowth that responded prev to rifaximin (Xifaxan Rx written) & consider anorectal biofeedback; she was quite anxious & wanted to know when her next Colonoscopy was due (last 2006 was neg & f/u due 2016)...  ~  May 15, 2011:  44mo ROV & here for urgent add-on appt  For the same old symptoms it appears> c/o not feeling well, bloating, blood in stool, not emptying her bowels well, and "alot of little issues like painin left leg from knee to groin, numbness in left foot, light headed, & throbbing in her head... Review of interval EPIC records shows ER visit 12/12 w/ UTI (mult species), and she's had 4 CT scans of her head + MRI/MRA all in the last year... She is most concerned about the left foot numbness which she feels is getting worse & wants a Neurology appt w/ DrWillis for further eval...    Hx AB> states she occ "feels tight" in chest, comes & goes, esp lying down at night, "it whistles & pushes itself out"; I rec that she takes her Xanax each eve; states DrDeveshwar put her on Ambien Qhs for her FM & she takes 1/2...    Hx AtypCP & palpit> on ToprolXL25- 1/2 tab daily> BP= 122/72 today & she denies any recent CP, palpit, ch in SOB, edema...    Cerebrovasc dis> on ASA 325mg /d; hx remote lacunar infarcts & known sm vessel dis; no acute cerebral ischemic symptoms...    Chol> on diet alone & reasonably well controlled; see labs below...    GI> followed by DrStark here, DrOrman at [facility] GERD, LER, Gastroparesis, IBS, bloating, globus, fecal incont; on Omep40, Domperidone10Qid, & recently given another course of Xifaxan by DrOrman for bloating & poss bact overgrowth; she tells me they are planning another colonoscopy soon.  09/29/2011 Acute OV Was at pre-op visit for eye surgery and b/p was noted elevated ~140 -150  No visual speech changes. No chest pain, no fever  Felt fine at preop visit.    B/p averages in office 100-120 over last 4 years . Does complain of low energy, not sleeping.  Out of her ambien. Does want labs today .  No extremity weakness, chest pain , dyspnea or edema.  No NSAID or decongestant use.  She had been on low dose metoprolol for short time by Dr. Katrinka Blazing in cards for palpitations , stopped this 2-3 weeks ago per Dr. Katrinka Blazing due to resolution of palpitations.           Problem List:    Hx of DYSPHONIA (EAV-409.81) - She has seen ENT- DrTeoh on several occas w/ cerumen impactions, sensorineural hearing loss, dysphonia & globus phenomenon (the latter felt secondary to GERD)... laryngoscopy was  neg... she was maintained on Zegerid 40mg  Bid & switched to OMEPRAZOLE 40mg /d for $$ reasons... ~  2010:  referred by [facility] to their ENT Dept for voice therapy w/ eval by DrBuckmire- dysphonia & vocal fold weakness, plus an epithelial lesion that was biopsied & benign... ~  2012:  Further eval by [facility]> bilat pyriform plaques (r/o candida & rx w/ Nystatin oral- resolved) & vocal tremor; she continues on PPI rx for LER; they did CT scan & pt reports that this was neg; they rec voice therapy but she declined... ~  11/12: she reports that voice, throat are better; rec to continue the PPI & antireflux regimen...  R/O SLEEP APNEA (ICD-780.57) - sleep study 4/08 showed RDI= 6 & some restless leg symptoms (tried Requip)... DrDeveshwar has tried Provigil for Idiopathic Hypersomnolence...  Hx of ASTHMATIC BRONCHITIS, ACUTE (ICD-466.0) - no recent URIs reported... ~  CXR 1/12 showed norm heart size, tort aorta, clear lungs, DJD Tspine & mild scoliosis...  Hx of ATYPICAL CHEST PAIN, & PALPITATIONS >> Cardiac eval by DrHSmith... ~  cardiac cath 1997 showed normal coronaries ~  2DEcho 9/03 showed mild conc LVH, norm EF, mild AoV sclerosis, mild thickening MV leaflets, mild MR... ~  NuclearStressTest 11/08 was WNL- no ischemia, no infarct, EF= 79%... ~  7/12:   She requests cardiac eval from DrHSmith for reassurance regarding palpitation; seen 9/12 w/ norm EKG & Holter done (we don't have results)...  CEREBROVASCULAR DISEASE (ICD-437.9) - on ASA 325mg /d... she was eval by DrStiefel for yrs ago & DrWillis more recently... ~  MRI Brain 6/06 showed remote lacunar infarcts in the cerebellum bilaterally, chr small vessel dis, NAD... ~  2011:  Neuro eval by DrWillis for memory disturbance & report of transient aphasia> we don't have the scan report but his note indicates MRI relatively unremarkable & EEG was WNL; rec ASA & Axona (she never tried it). ~  Seen in ER 8/12 w/ palpit & memory loss> CT Brain was neg x sm vessel dis...  HYPERCHOLESTEROLEMIA (ICD-272.0)0 - on diet alone... ~  FLP 11/08 showed TChol 212, TG 57, HDL 68, LDL 132 ~  FLP 1/10 showed TChol 207, TG 62, HDL 64, LDL 127... she prefers to continue diet management. ~  FLP 3/11 showed TChol 238, TG 61, HDL 71, LDL 154... she is reluctant to consider Statin Rx due to side effects. ~  FLP 11/11 showed TChol 221, TG 54, HDL 66, LDL 129 ~  FLP 5/12 showed TChol 171, TG 56, HDL 59, LDL 101... rec to continue diet & exercise program...  GERD (ICD-530.81), & ESOPHAGEAL STRICTURE (ICD-530.3) - on OMEPRAZOLE 40mg /d now + off prev Carafate Rx... ~  EGD 7/07 by DrStark showed GERD & stricture- dilated... ~  pt seen by GI/ DrStark Jun09- outlined plans for Rx w/ Zegerid, Domperidone, Carafate... ~  2010: she had 2nd opinion consult [facility] DrOrman & Piedad Climes- mult GI complaints w/ IBS, delayed gastric emptying, hx dysphagia, globus sensation, & fecal incontinence... treated w/ PPI, Dromperidone, Remeron, & Voice therapy w/ referral to ENT Dept. ~  2011:  Further eval & testing at [facility] (we don't have records from GI)... ~  9/12:  She had f/u [facility] DrBarritt & Orman> no changes made, they rec Ativan Tid...  GASTROPARESIS (ICD-536.3) - tried REGLAN- discontinued due to reaction w/ anxiety... DrStark added  DOMPERIDONE 10mg  Qid & she still takes this... ~  Gastric Emptying Scan 10/08 showed 93% activity remaining in the  stomach at 60 min, and 75% remaining at 2H (normal is <30% at [facility])...  DIVERTICULOSIS OF COLON (ICD-562.10),  IRRITABLE BOWEL SYNDROME (ICD-564.1), & HEMORRHOIDS (ICD-455.6) -  ~  CT Abd & Pelvis in 2001 was WNL... ~  colonoscopy 9/06 by DrStark showed divertics and int hems... she had spasm in the sigmoid region.  URINARY INCONTINENCE (ICD-788.30) - on DETROL LA 4mg /d...  HEMATURIA, MICROSCOPIC, HX OF (ICD-V13.09)  DEGENERATIVE JOINT DISEASE (ICD-715.90) - she has some DDD in lumbar spine and osteoarthritis in knees... ~  5/12:  She called w/ leg discomfort & some subjective swelling on left side> VenDopplers were neg/ normal bilaterally...  FIBROMYALGIA (ICD-729.1) - followed by DrDeveshwar for Rheum... c/o insomnia, fatigue, fogginess, and mult somatic complaints "I stay tired"... tried Ambien, Provigil, etc... ~  5/12:  She has mult somatic complaints; routine lab showed Sed rate= 61 ?etiology & she is referred back to [facility] for further eval... ~  7/12:  DrDeveshwar referred her back here & serial Sed rates= 61, 34, 16 (now wnl)...  NEUROLOGY>> see above Cerebrovasc Dis ~  She's had recurrent & long standing complaints of poor memory & has seen DrStiefel & DrWillis... ~  3/13: c/o numbness in left foot & toes w/ pain in left leg from knee to groin; she is concerned & wants further eval- referred to Neuro.  ANXIETY (ICD-300.00) - prev tried on ALPRAZOLAM 0.5mg - 1/2 to 1 tab three times a day as needed (but she rarely takes it)... DrDarling at [facility] 15mg  for sleep (no longer taking this)... DrCousins, GYN Rx w/ Lexapro (pt says she stopped this med on her own).  VITAMIN D DEFICIENCY (ICD-268.9) - on Vit D 50K weekly... ~  Vit D level 1/10 = 15 (30-90) therefore started on Vit D 50,000 u weekly... ~  labs 3/11 showed Vit D level = 39 ~  labs 11/11 showed Vit  D level = 59... continue 50K weekly at her request..  HEALTH MAINTENANCE>  GYN= DrCousins w/ PAP Q36yrs, seen 3/11 & started on Lexapro...  Mammograms at [facility]- done in 2009 neg x cysts... BMD at [facility] ~47yrs ago per pt (we only have copy of BMD from 2002= WNL w/ pos TScores)...   Past Surgical History  Procedure Date  . Abdominal hysterectomy 1995  . Corneal transplant for keratonconus   . Right bunion surgery   . Left foot fracture with orif 07/2004    Dr. Lajoyce Corners    Outpatient Encounter Prescriptions as of 09/29/2011  Medication Sig Dispense Refill  . ALPRAZolam (XANAX) 0.5 MG tablet Take 1/2 to 1 tablet by mouth three times daily as needed for nerves  90 tablet  0  . aspirin (ECOTRIN) 325 MG EC tablet Take 325 mg by mouth daily.        Marland Kitchen DETROL LA 4 MG 24 hr capsule TAKE ONE CAPSULE BY MOUTH EVERY NIGHT AT BEDTIME  30 capsule  5  . ergocalciferol (VITAMIN D2) 50000 UNITS capsule Take 1 capsule (50,000 Units total) by mouth once a week.  12 capsule  3  . fish oil-omega-3 fatty acids 1000 MG capsule Take 2 g by mouth daily.      . Flavoring Agent (BLUEBERRY FLAVOR) POWD by Does not apply route. Put 1 tsp in 10 oz of liquid daily      . LOTEMAX 0.5 % ophthalmic suspension Place 1 drop into both eyes Daily.       Marland Kitchen omeprazole (PRILOSEC) 40 MG capsule Take 1 capsule (40  mg total) by mouth daily.  30 capsule  6  . OVER THE COUNTER MEDICATION Take 1 tablet by mouth daily. Pt takes domperidone that she gets mail order from [place]       . rifaximin (XIFAXAN) 200 MG tablet Take 200 mg by mouth 3 (three) times daily.      . Vitamin D, Ergocalciferol, (DRISDOL) 50000 UNITS CAPS TAKE 1 CAPSULE BY MOUTH EVERY WEEK  4 capsule  10  . escitalopram (LEXAPRO) 10 MG tablet Take 10 mg by mouth daily. As directed by Dr. Cherly Hensen      . losartan (COZAAR) 50 MG tablet Take 1 tablet (50 mg total) by mouth daily.  30 tablet  5  . zolpidem (AMBIEN) 10 MG tablet Take 10 mg by mouth at bedtime as needed. Takes 1/2 tablet prn  sleep      . DISCONTD: metoprolol succinate (TOPROL-XL) 25 MG 24 hr tablet TAKE 1/2 TABLET DAILY  30 tablet  6    Allergies  Allergen Reactions  . Metoclopramide Hcl     REACTION: anxious and out of her mind  . Morphine     REACTION: lethargy/malaise  . Penicillins     Rash, itching  . Prednisone     REACTION: rash/hives  . Sulfonamide Derivatives     REACTION: itching    Review of Systems Constitutional:   No  weight loss, night sweats,  Fevers, chills, + fatigue, or  lassitude.  HEENT:   No headaches,  Difficulty swallowing,  Tooth/dental problems, or  Sore throat,                No sneezing, itching, ear ache, nasal congestion, post nasal drip,   CV:  No chest pain,  Orthopnea, PND, swelling in lower extremities, anasarca, dizziness, palpitations, syncope.   GI  No heartburn, indigestion, abdominal pain, nausea, vomiting, diarrhea, change in bowel habits, loss of appetite, bloody stools.   Resp: No shortness of breath with exertion or at rest.  No excess mucus, no productive cough,  No non-productive cough,  No coughing up of blood.  No change in color of mucus.  No wheezing.  No chest wall deformity  Skin: no rash or lesions.  GU: no dysuria, change in color of urine, no urgency or frequency.  No flank pain, no hematuria   MS:  No joint  swelling.  No decreased range of motion.  Muscle soreness +   Psych:  No change in mood or affect. No depression or anxiety.  No memory loss. +insomnia               Objective:   Physical Exam     WD, WN, 71 y/o BF in NAD,  GENERAL:  Alert & oriented; pleasant & cooperative... HEENT:  [place]/AT, EOM-wnl, PERRLA, EACs-clear, TMs-wnl, NOSE-clear, THROAT-clear & wnl. NECK:  Supple w/ fairROM; no JVD; normal carotid impulses w/o bruits; no thyromegaly or nodules palpated; no lymphadenopathy. CHEST:  Clear to P & A; without wheezes/ rales/ or rhonchi heard... HEART:  Regular Rhythm; without murmurs/ rubs/ or gallops  detected... ABDOMEN:  Soft /NT  normal bowel sounds; no organomegaly or masses palpated... EXT: without deformities, mild arthritic changes; no varicose veins/ venous insuffic/ or edema. NEURO:  CN's intact; motor testing normal; sensory testing normal; gait normal & balance OK. DERM:  no lesions seen...     Assessment & Plan:

## 2011-11-10 ENCOUNTER — Telehealth: Payer: Self-pay | Admitting: Pulmonary Disease

## 2011-11-10 DIAGNOSIS — E7800 Pure hypercholesterolemia, unspecified: Secondary | ICD-10-CM

## 2011-11-10 NOTE — Telephone Encounter (Signed)
I spoke with pt and she has an appt scheduled to see SN tomorrow for follow up appt. She stated she knows she is due for her cpx soon. Looked in her chart and does not see where she had cpx since 04/2009. Pt is wanting to know if she can also have her cpx tomorrow. She is also wanting to have labs done as well. Please advise SN thanks

## 2011-11-10 NOTE — Telephone Encounter (Signed)
Per SN- pt had full labs done on 09-29-11 when she saw TP except for FLP. Pt can come in fasting tomorrow to have this done if she would like. Pt is on Medicare and they no longer cover physicals in the way the pt is referring. I spoke with the pt and advised. She will come in tomorrow for OV and get FLP done as well. Carron Curie, CMA

## 2011-11-11 ENCOUNTER — Encounter: Payer: Self-pay | Admitting: Pulmonary Disease

## 2011-11-11 ENCOUNTER — Other Ambulatory Visit (INDEPENDENT_AMBULATORY_CARE_PROVIDER_SITE_OTHER): Payer: Medicare Other

## 2011-11-11 ENCOUNTER — Telehealth: Payer: Self-pay | Admitting: Pulmonary Disease

## 2011-11-11 ENCOUNTER — Ambulatory Visit (INDEPENDENT_AMBULATORY_CARE_PROVIDER_SITE_OTHER): Payer: Medicare Other | Admitting: Pulmonary Disease

## 2011-11-11 VITALS — BP 132/78 | HR 72 | Temp 97.0°F | Ht 61.0 in | Wt 130.2 lb

## 2011-11-11 DIAGNOSIS — M1990 Unspecified osteoarthritis, unspecified site: Secondary | ICD-10-CM

## 2011-11-11 DIAGNOSIS — K219 Gastro-esophageal reflux disease without esophagitis: Secondary | ICD-10-CM

## 2011-11-11 DIAGNOSIS — Z23 Encounter for immunization: Secondary | ICD-10-CM

## 2011-11-11 DIAGNOSIS — F411 Generalized anxiety disorder: Secondary | ICD-10-CM

## 2011-11-11 DIAGNOSIS — K589 Irritable bowel syndrome without diarrhea: Secondary | ICD-10-CM

## 2011-11-11 DIAGNOSIS — I679 Cerebrovascular disease, unspecified: Secondary | ICD-10-CM

## 2011-11-11 DIAGNOSIS — K5730 Diverticulosis of large intestine without perforation or abscess without bleeding: Secondary | ICD-10-CM

## 2011-11-11 DIAGNOSIS — IMO0001 Reserved for inherently not codable concepts without codable children: Secondary | ICD-10-CM

## 2011-11-11 DIAGNOSIS — K3184 Gastroparesis: Secondary | ICD-10-CM

## 2011-11-11 DIAGNOSIS — E7800 Pure hypercholesterolemia, unspecified: Secondary | ICD-10-CM

## 2011-11-11 LAB — LDL CHOLESTEROL, DIRECT: Direct LDL: 132.6 mg/dL

## 2011-11-11 LAB — LIPID PANEL: HDL: 75.5 mg/dL (ref >39.00)

## 2011-11-11 NOTE — Telephone Encounter (Signed)
Spoke with pt. She states forgot to mention to SN today that she is having pain in her left leg- from thigh to groin on and off x several months. She states that the pain gets worse at hs and if she lies on her left side for too long this causes pain which improves when she lies on her right side. Pt asking if needs to have MRI or other testing done. OK with a call back tomorrow. Please advise, thanks!! Allergies  Allergen Reactions  . Metoclopramide Hcl     REACTION: anxious and out of her mind  . Morphine     REACTION: lethargy/malaise  . Penicillins     Rash, itching  . Prednisone     REACTION: rash/hives  . Sulfonamide Derivatives     REACTION: itching

## 2011-11-11 NOTE — Patient Instructions (Addendum)
Today we updated your med list in our EPIC system...    Continue your current medications the same...  We recommend taking one of the OTC "Probiotics" like ALIGN daily to see if it helps your GI symptoms...  We discussed trying the Activia yogurt as well...  We gave you the 2013 Flu vaccine today...  Today we did your follow up FASTING lipid profile...    We will call you w/ the results when avail...  Call for any questions...  Let's continue our 6 month follow up visits.Marland KitchenMarland Kitchen

## 2011-11-12 NOTE — Telephone Encounter (Signed)
Per SN----this should be evaluated by her rheumatologist or ortho.  thanks

## 2011-11-12 NOTE — Telephone Encounter (Signed)
LMTCB x 1 

## 2011-11-13 ENCOUNTER — Other Ambulatory Visit: Payer: Self-pay | Admitting: Pulmonary Disease

## 2011-11-13 NOTE — Telephone Encounter (Signed)
Pt aware.

## 2011-11-17 ENCOUNTER — Telehealth: Payer: Self-pay | Admitting: Pulmonary Disease

## 2011-11-17 DIAGNOSIS — E7800 Pure hypercholesterolemia, unspecified: Secondary | ICD-10-CM

## 2011-11-17 NOTE — Telephone Encounter (Signed)
Marliss Czar was calling to inform pt:  Notes Recorded by Michele Mcalpine, MD on 11/14/2011 at 7:35 AM Please notify patient> she does not want statin meds... On FishOil alone w/ elev TChol/ LDL> needs better low chol low fat diet  ------  Called, spoke with pt.  I informed her of below per Dr. Kriste Basque.  Pt was very upset and concerned about this.  Pt states she was told her last chol levels looked good.  Pt states she does eat a low fat and low chol diet now.  States she seldom eats meats, fat, butter, or breads.  States she has a smoothie with fruits and greens it in, eats salmon, and uses extra virgin olive oil.  She would like further clarification on this and her labs.  Also, requesting these labs and the ones from March and Aug to be mailed to her home address so she can look at this.  Dr. Kriste Basque, pls advise of above and if ok to mail copy of labs to pt.  Thank you.

## 2011-11-17 NOTE — Telephone Encounter (Signed)
Per SN---have printed labs and we will mail these to the pt.    Will need to refer to lipid clinic for further eval of chol.  i have called and spoke with pt and explained her labs again to her and she is aware that a copy of her labs will be mailed to her.

## 2011-11-24 ENCOUNTER — Ambulatory Visit: Payer: Medicare Other | Admitting: Pharmacist

## 2011-11-25 ENCOUNTER — Ambulatory Visit (INDEPENDENT_AMBULATORY_CARE_PROVIDER_SITE_OTHER): Payer: Medicare Other | Admitting: Pharmacist

## 2011-11-25 ENCOUNTER — Encounter: Payer: Self-pay | Admitting: Pharmacist

## 2011-11-25 VITALS — BP 115/72

## 2011-11-25 DIAGNOSIS — E7800 Pure hypercholesterolemia, unspecified: Secondary | ICD-10-CM

## 2011-11-25 MED ORDER — ROSUVASTATIN CALCIUM 5 MG PO TABS: ORAL_TABLET | ORAL | Status: DC

## 2011-11-25 NOTE — Patient Instructions (Addendum)
Start taking Crestor 5mg  on 3 days of the week.  If you have any problems, please call Kennon Rounds at [phone].   Try to exercise at least 10-15 minutes 4-5 days per week.   We will see you in 6 weeks.

## 2011-11-28 ENCOUNTER — Telehealth: Payer: Self-pay | Admitting: Pulmonary Disease

## 2011-11-28 NOTE — Telephone Encounter (Signed)
Called and spoke with patient, concerned that 3 days ago while getting a lipid panel drawn was asked if she was taking losartan.  Patient states she wanted Dr. Kriste Basque to know that she has NEVER been on a BP medication and that her BP is "ALWAYS good except for one time."  Looking through chart patient is on losartan according to Chart and was just ordered 09/26/11.  Per message from 09/26/11 this was ordered for pt to try if she wanted to. Call Documentation       Carver Fila, [facility] 09/26/2011 2:32 PM Signed  Per SN he shares her concern regarding BP: suggest adding losartan 50 mg QD #30. Alternatively if she doesn't like this then he suggests she could call her cardiologists--Dr. Katrinka Blazing for his suggestions.  I spoke with pt and is aware of SN response. She states she would like rx called in for her (this has been done)    Informed patient of this and that evidently it appears she just did not pick up Rx.  Patient verbalized understanding of this.  If ok with Dr. Kriste Basque can we D/C this from patient med list?  Also patient requesting refill for Ambien 1/2-1 tab at bedtime as needed for sleep  and Xanax 1/2-1 tablet three times a day as needed for nerves.  Last OV 11/11/11. Please advise, thank you  Allergies  Allergen Reactions  . Metoclopramide Hcl     REACTION: anxious and out of her mind  . Morphine     REACTION: lethargy/malaise  . Penicillins     Rash, itching  . Prednisone     REACTION: rash/hives  . Sulfonamide Derivatives     REACTION: itching

## 2011-12-01 ENCOUNTER — Encounter: Payer: Self-pay | Admitting: Pulmonary Disease

## 2011-12-01 NOTE — Progress Notes (Signed)
Subjective:    Patient ID: Paige Pena, female    DOB: 25-Sep-1940, 71 y.o.   MRN: [id number]  HPI 71 y/o BF here for a follow up visit... she has multiple medical problems as noted below... Her daughter is a Publishing rights manager.  ~  May 23, 2009:  she has had GI evals from DrStark, and DrOrman at [facility] w/ IBS, bloating, delayed gastric emptying, globus sensation, fecal incontinence (their notes are reviewed w/ pt)> last note DrOrman 11/10- f/u mult GI complaints; they tried biofeedback, Zegerid, Domperidone 10mg Bid, Remeron Qhs, & ENT referral (Voice therapy)...  she switched the Zegerid to Omep40mg  for $$ reasons; asks that we send copy of colonoscopy to DrOrman; recently seen by GYN= DrCousins and started on LEXAPRO 10mg /d... she is quite anxious & not using her Alprazolam regularly & we discussed this...  ~  January 15, 2010:  she returns w/ mult somatic complaints> HA in left temple area, SOB- can't get a DB, pressure in abd, occas ankle pain, etc... she has seen DrDarling & Orman at [facility] w/ Dx of IBS, delayed gastric emptying, nausea, bloating, fecal incontinence- they wanted her to continue Remeron for sleep, Domperidone for delayed gastric emptying, & gave her a course of Rifaximin for the bloating... she also saw DrBuckmire [facility] for hoarseness w/ dysphonia, vocal fold weakness, & an epithelial lesion that was biopsied & benign... she also saw DrWillis for Neuro 7/11 w/ c/o memory problems & he did labs/ MRI Brain/ EEG> reported to be unremarkable...  ~  Jul 15, 2010:  33mo ROV & she continues to have mult complaints that seem revolve around 3 systems> GI, Neuro, & Rheum/Ortho:  GI problems as noted above w/ eval & rx from [facility] w/ referral to ENT as well w/ eval DrBuckmire & she says they saw something in her throat & are planning a CT scan for further eval;  Neuro eval DrWillis for memory & ?transient aphasia (exam was neg & rec to take ASA 81mg /d & given Axona dietary product);  She  has seen DrDeveshwar in the past for prob FM, OA, & DDD in lumbar spine> labs today showed elev sed rate (61) ?etiology & she is requested to f/u w/ Rheum for further investigation ==> (DrDeveshwar scared her & told her she might need an Oncology referral for the elev sed rate)...  ~  September 16, 2010:  51mo ROV & she is c/o SOB, can't get a deep breath, & occas nocturnal palpit; she remains quite anxious about her health & we discussed taking the Alpraz "combination relaxer" 1/2 Tid regularly;  She would like to see DrHSmith for Cards evaluation and reassurance about her cardiac status...    She has continued her evaluations & f/u at [facility] (ENT- DrBuckmire) w/ bilat pyriform plaques (r/o candida & rx w/ Nystatin oral) & vocal tremor; she continues on PPI rx for LER; they did CT scan & pt reports that this was neg; they rec voice therapy but she declined...    Note from [facility] 5/12 notes FM hx, incr fatigue, drowsy, denies joint swelling/ muscle tenderness/ or weakness; exam +for tender points & generalized pain; given Ambien for sleep; she repeated labs & Sed=34, ANA neg, SPE w/o Mspike; pt states that DrDeveshwar referred her back here to evaluate the Sed rate ("she doesn't do that") & mentioned that she might need to see an oncologist & this really concerned the pt;  We discussed this & decided on further f/u labs at this time (Sed  improved 61==>16)...  ~  January 22, 2011:  38mo ROV & she is unchanged clinically> not feeling well, c/o stomach problems, sluggish, etc... She went to the ER 8/12 w/ "palpit & memory loss", exam neg & no neuro deficits, Labs ok, CT Brain neg w/o acute changes & just some sm vessel dis, given Ativan & advised f/u primary MD... She has seen DrWillis in past for Neuro> he rec Axona but she wouldn't take it preferring a brain supplement she saw on TV...    She had Cards f/u DrHSmith 9/12> c/o palpit, he noted neg prior cardiac work up w/o ischemia on Myoview 2008 & no CAD on  cath 1997; he noted normal EKG & ordered Holter monitor    She had GI f/u DrBarritt & Orman at [facility] 9/12> IBS, delayed gastric emptying, nausea, bloating, fecal incont> on Omep40 & Domperidone 10mg Tid; they suspected sm bowel bact overgrowth that responded prev to rifaximin (Xifaxan Rx written) & consider anorectal biofeedback; she was quite anxious & wanted to know when her next Colonoscopy was due (last 2006 was neg & f/u due 2016)...  ~  May 15, 2011:  38mo ROV & here for urgent add-on appt  For the same old symptoms it appears> c/o not feeling well, bloating, blood in stool, not emptying her bowels well, and "alot of little issues like pain in left leg from knee to groin, numbness in left foot, light headed, & throbbing in her head... Review of interval EPIC records shows ER visit 12/12 w/ UTI (mult species), and she's had 4 CT scans of her head + MRI/MRA all in the last year... She is most concerned about the left foot numbness which she feels is getting worse & wants a Neurology appt w/ DrWillis for further eval...    Hx AB> states she occ "feels tight" in chest, comes & goes, esp lying down at night, "it whistles & pushes itself out"; I rec that she takes her Xanax each eve; states DrDeveshwar put her on Ambien Qhs for her FM & she takes 1/2...    Hx AtypCP & palpit> on ToprolXL25- 1/2 tab daily> BP= 122/72 today & she denies any recent CP, palpit, ch in SOB, edema...    Cerebrovasc dis> on ASA 325mg /d; hx remote lacunar infarcts & known sm vessel dis; no acute cerebral ischemic symptoms...    Chol> on diet alone & reasonably well controlled; see labs below...    GI> followed by DrStark here, DrOrman at [facility] GERD, LER, Gastroparesis, IBS, bloating, globus, fecal incont; on Omep40, Domperidone10Qid, & recently given another course of Xifaxan by DrOrman for bloating & poss bact overgrowth; she tells me they are planning another colonoscopy soon.  ~  November 11, 2011:  44mo ROV & Neilani persists w/ mult  somatic complaints- "where do I begin?" she says> c/o recent eye surg, feeling tired and achey, resting poorly even w/ prn ambien, "everything happens on my left side" eg- slight headache noted, feels bloated, wants to start low impact aerobics, etc...    She reports tear duct & lower lid surg (ectropion)- now improved, "I was all tensed up about that"- note remote hx of corneal transplant...     She called 09/26/11 w/ concern for her BP & we suggested adding Cozaar50 but she never picked this med up from the pharm & now she states that her pressure has ALWAYS been good except one time & she doesn't need meds for that.    She had f/u colonoscopy  at [facility] 5/13> diverticulosis only, no polyps or lesions...    She saw DrWillis 4/13 for numbness & discomfort in left leg> EMG/ NCV was essentially neg & no meds needed, given samples of Axona... We reviewed prob list, meds, xrays and labs> see below for updates >> OK 2013 Flu vaccine today... LABS 8/13:  Chems- wnl;  CBC- wnl;  TSH= 2.75 LABS 9/13 showed FLP w/ TChol 226, TG 53, HDL 76, LDL 133           Problem List:    Hx of DYSPHONIA (ZOX-096.04) - She has seen ENT- DrTeoh on several occas w/ cerumen impactions, sensorineural hearing loss, dysphonia & globus phenomenon (the latter felt secondary to GERD)... laryngoscopy was neg... she was maintained on Zegerid 40mg  Bid & switched to OMEPRAZOLE 40mg /d for $$ reasons... ~  2010:  referred by [facility] to their ENT Dept for voice therapy w/ eval by DrBuckmire- dysphonia & vocal fold weakness, plus an epithelial lesion that was biopsied & benign... ~  2012:  Further eval by [facility] GI & ENT Depts> bilat pyriform plaques (r/o candida & rx w/ Nystatin oral- resolved) & vocal tremor; she continues on PPI rx for LER; they did CT scan & pt reports that this was neg; they rec voice therapy but she declined... ~  11/12: she reports that voice, throat are better; rec to continue the PPI & antireflux  regimen...  R/O SLEEP APNEA (ICD-780.57) - sleep study 4/08 showed RDI= 6 & some restless leg symptoms (tried Requip)... DrDeveshwar has tried Provigil for Idiopathic Hypersomnolence...  Hx of ASTHMATIC BRONCHITIS, ACUTE (ICD-466.0) - no recent URIs reported... ~  CXR 1/12 showed norm heart size, tort aorta, clear lungs, DJD Tspine & mild scoliosis...  Hx of ATYPICAL CHEST PAIN, & PALPITATIONS >> Cardiac eval by DrHSmith... ~  cardiac cath 1997 showed normal coronaries ~  2DEcho 9/03 showed mild conc LVH, norm EF, mild AoV sclerosis, mild thickening MV leaflets, mild MR... ~  NuclearStressTest 11/08 was WNL- no ischemia, no infarct, EF= 79%... ~  7/12:  She requests cardiac eval from DrHSmith for reassurance regarding palpitation; seen 9/12 w/ norm EKG & Holter done (we don't have results)... ~  9/13:  BP= 132/78 & she gets 120-130/70's at home...  CEREBROVASCULAR DISEASE (ICD-437.9) - on ASA 325mg /d... she was eval by DrStiefel for yrs ago & DrWillis more recently... ~  MRI Brain 6/06 showed remote lacunar infarcts in the cerebellum bilaterally, chr small vessel dis, NAD... ~  2011:  Neuro eval by DrWillis for memory disturbance & report of transient aphasia> we don't have the scan report but his note indicates MRI relatively unremarkable & EEG was WNL; rec ASA & Axona (she never tried it). ~  Seen in ER 8/12 w/ palpit & memory loss> CT Brain was neg x sm vessel dis...  HYPERCHOLESTEROLEMIA (ICD-272.0)0 - on diet alone... ~  FLP 11/08 showed TChol 212, TG 57, HDL 68, LDL 132 ~  FLP 1/10 showed TChol 207, TG 62, HDL 64, LDL 127... she prefers to continue diet management. ~  FLP 3/11 showed TChol 238, TG 61, HDL 71, LDL 154... she is reluctant to consider Statin Rx due to side effects. ~  FLP 11/11 showed TChol 221, TG 54, HDL 66, LDL 129 ~  FLP 5/12 showed TChol 171, TG 56, HDL 59, LDL 101... rec to continue diet & exercise program... ~  FLP 9/13 showed TChol 226, TG 53, HDL 76, LDL  133  GERD (ICD-530.81), &  ESOPHAGEAL STRICTURE (ICD-530.3) - on OMEPRAZOLE 40mg /d now + off prev Carafate Rx... ~  EGD 7/07 by DrStark showed GERD & stricture- dilated... ~  pt seen by GI/ DrStark Jun09- outlined plans for Rx w/ Zegerid, Domperidone, Carafate... ~  2010: she had 2nd opinion consult [facility] DrOrman & Piedad Climes- mult GI complaints w/ IBS, delayed gastric emptying, hx dysphagia, globus sensation, & fecal incontinence... treated w/ PPI, Dromperidone, Remeron, & Voice therapy w/ referral to ENT Dept. ~  2011:  Further eval & testing at [facility] (we don't have records from GI)... ~  9/12:  She had f/u [facility] DrBarritt & Orman> no changes made, they rec Ativan Tid...  GASTROPARESIS (ICD-536.3) - tried REGLAN- discontinued due to reaction w/ anxiety... DrStark added DOMPERIDONE 10mg  Qid & she still takes this... ~  Gastric Emptying Scan 10/08 showed 93% activity remaining in the stomach at 60 min, and 75% remaining at 2H (normal is <30% at [facility])...  DIVERTICULOSIS OF COLON (ICD-562.10),  IRRITABLE BOWEL SYNDROME (ICD-564.1), & HEMORRHOIDS (ICD-455.6) -  ~  CT Abd & Pelvis in 2001 was WNL... ~  colonoscopy 9/06 by DrStark showed divertics and int hems... she had spasm in the sigmoid region. ~  Extensive GI evals from DrStark here, DrOrman at [facility] GERD, LER, Gastroparesis, IBS, bloating, globus, fecal incont; on Omep40, Domperidone10Qid, & recently given another course of Xifaxan by DrOrman for bloating & poss bact overgrowth; she tells me they are planning another colonoscopy soon. ~  She had f/u colonoscopy at [facility] 5/13> diverticulosis only, no polyps or lesions...   URINARY INCONTINENCE (ICD-788.30) - on DETROL LA 4mg /d...  HEMATURIA, MICROSCOPIC, HX OF (ICD-V13.09)  DEGENERATIVE JOINT DISEASE (ICD-715.90) - she has some DDD in lumbar spine and osteoarthritis in knees... ~  5/12:  She called w/ leg discomfort & some subjective swelling on left side> VenDopplers were neg/ normal  bilaterally...  FIBROMYALGIA (ICD-729.1) - followed by DrDeveshwar for Rheum... c/o insomnia, fatigue, fogginess, and mult somatic complaints "I stay tired"... tried Ambien, Provigil, etc... ~  5/12:  She has mult somatic complaints; routine lab showed Sed rate= 61 ?etiology & she is referred back to [facility] for further eval... ~  7/12:  DrDeveshwar referred her back here & serial Sed rates= 61, 34, 16 (now wnl)...  NEUROLOGY>> see above Cerebrovasc Dis ~  She's had recurrent & long standing complaints of poor memory & has seen DrStiefel & DrWillis... ~  MRI/ MRA Brain 1/12 showed mild age appropriate atrophy & sm vessel dis, sm bilat cerebellar infarcts, dolichoectasia of the ICAs L>R... ~  CT Head 12/12 showed mild sm vessel dis, sm chr lacunar infarcts in cerebellar hemispheres bilat, NAD... ~  3/13: c/o numbness in left foot & toes w/ pain in left leg from knee to groin; she is concerned & wants further eval- referred to Neuro.  ANXIETY (ICD-300.00) - prev tried on ALPRAZOLAM 0.5mg - 1/2 to 1 tab three times a day as needed (but she rarely takes it)... DrDarling at [facility] 15mg  for sleep (no longer taking this)... DrCousins, GYN Rx w/ Lexapro (pt says she stopped this med on her own)... Now on AMBIEN 10mg  prn sleep.  VITAMIN D DEFICIENCY (ICD-268.9) - on Vit D 50K weekly... ~  Vit D level 1/10 = 15 (30-90) therefore started on Vit D 50,000 u weekly... ~  labs 3/11 showed Vit D level = 39 ~  labs 11/11 showed Vit D level = 59... continue 50K weekly at her request..  HEALTH MAINTENANCE>  GYN=  DrCousins w/ PAP Q67yrs, seen 3/11 & started on Lexapro...  Mammograms at [facility]- done in 2009 neg x cysts... BMD at [facility] ~77yrs ago per pt (we only have copy of BMD from 2002= WNL w/ pos TScores)...   Past Surgical History  Procedure Date  . Abdominal hysterectomy 1995  . Corneal transplant for keratonconus   . Right bunion surgery   . Left foot fracture with orif 07/2004    Dr. Lajoyce Corners     Outpatient Encounter Prescriptions as of 11/11/2011  Medication Sig Dispense Refill  . ALPRAZolam (XANAX) 0.5 MG tablet Take 1/2 to 1 tablet by mouth three times daily as needed for nerves  90 tablet  0  . aspirin (ECOTRIN) 325 MG EC tablet Take 325 mg by mouth daily.        . ergocalciferol (VITAMIN D2) 50000 UNITS capsule Take 1 capsule (50,000 Units total) by mouth once a week.  12 capsule  3  . LOTEMAX 0.5 % ophthalmic suspension Place 1 drop into both eyes Daily.       Marland Kitchen omeprazole (PRILOSEC) 40 MG capsule Take 1 capsule (40 mg total) by mouth daily.  30 capsule  6  . OVER THE COUNTER MEDICATION Take 1 tablet by mouth 3 (three) times daily. Pt takes domperidone that she gets mail order from [place]      . rifaximin (XIFAXAN) 200 MG tablet Take 200 mg by mouth 3 (three) times daily. 3 times a day as needed      . zolpidem (AMBIEN) 10 MG tablet Take 1/2 - 1 tab by mouth at bedtime as needed for sleep.  30 tablet  0  . DISCONTD: DETROL LA 4 MG 24 hr capsule TAKE ONE CAPSULE BY MOUTH EVERY NIGHT AT BEDTIME  30 capsule  5  . DISCONTD: fish oil-omega-3 fatty acids 1000 MG capsule Take 2 g by mouth daily.      Marland Kitchen losartan (COZAAR) 50 MG tablet Take 1 tablet (50 mg total) by mouth daily.  30 tablet  5  . DISCONTD: escitalopram (LEXAPRO) 10 MG tablet Take 10 mg by mouth daily. As directed by Dr. Cherly Hensen      . DISCONTD: Flavoring Agent (BLUEBERRY FLAVOR) POWD by Does not apply route. Put 1 tsp in 10 oz of liquid daily      . DISCONTD: Vitamin D, Ergocalciferol, (DRISDOL) 50000 UNITS CAPS TAKE 1 CAPSULE BY MOUTH EVERY WEEK  4 capsule  10    Allergies  Allergen Reactions  . Metoclopramide Hcl     REACTION: anxious and out of her mind  . Morphine     REACTION: lethargy/malaise  . Penicillins     Rash, itching  . Prednisone     REACTION: rash/hives  . Sulfonamide Derivatives     REACTION: itching    Review of Systems         See HPI - all other systems neg except as noted... The patient  complains of hoarseness, dyspnea on exertion, abdominal discomfort, gas/bloating, and muscle weakness.  The patient denies anorexia, fever, weight loss, weight gain, vision loss, decreased hearing, chest pain, syncope, peripheral edema, prolonged cough, headaches, hemoptysis, melena, hematochezia, severe indigestion/heartburn, hematuria, incontinence, suspicious skin lesions, transient blindness, difficulty walking, depression, unusual weight change, abnormal bleeding, enlarged lymph nodes, and angioedema.     Objective:   Physical Exam     WD, WN, 71 y/o BF in NAD, she appears anxious & has mult +trigger points. GENERAL:  Alert & oriented; pleasant & cooperative.Marland KitchenMarland Kitchen  HEENT:  [place]/AT, EOM-wnl, PERRLA, EACs-clear, TMs-wnl, NOSE-clear, THROAT-clear & wnl. NECK:  Supple w/ fairROM; no JVD; normal carotid impulses w/o bruits; no thyromegaly or nodules palpated; no lymphadenopathy. CHEST:  Clear to P & A; without wheezes/ rales/ or rhonchi heard... HEART:  Regular Rhythm; without murmurs/ rubs/ or gallops detected... ABDOMEN:  Soft & only min tender in mid-abd; normal bowel sounds; no organomegaly or masses palpated... EXT: without deformities, mild arthritic changes; no varicose veins/ venous insuffic/ or edema. NEURO:  CN's intact; motor testing normal; sensory testing normal; gait normal & balance OK. DERM:  no lesions seen...  RADIOLOGY DATA:  Reviewed in the EPIC EMR & discussed w/ the patient...  LABORATORY DATA:  Reviewed in the EPIC EMR & discussed w/ the patient...   Assessment & Plan:    Mult somatic complaints referable to the GI, Rheum, & Neuro systems; these are on-going, occ fluctuate, & seem unrelated...  DYSPHONIA>  ENT eval at [facility] is on-going & she is on antireflux regimen & PPI; rec to continue regular f/u w/ ENT & follow their recommendations; treated w/ Nystatin for candida & resolved; CT scan is reported neg; they suggested voice therapy but she declined...  PALPITATIONS>   She has hx atypCP w/ neg cath 1997 & neg Myoview2008; she is asked to avoid caffeine etc and take the Alprazolam regularly; she had Cards eval from DrHSmith & encouraged to f/u w/ him...  CHOL>  On diet alone & FLP is not at goal but HDL is good, continue diet + exercise program...  GI>  Extensive GI evals in [place] & [facility] as noted> GERD, Hx stricture, Gastroparesis, Divertics, IBS, Hems> currently only taking Omep40mg /d w/ prev regimen outlined above; she is rec to f/u w/ her gastroenterologist at [facility] follow their recommendations...  RHEUM>  FM/ OA/ DDD lumbar spine, etc>  Followed by DrDeveshwar & recent eval did not shed light on etiology of transiently elev sed rate; f/u Sed rates have been normal...  NEURO>  followed by DrWillis w/ hx memory disturbance, ?transient aphasia, prev MRI w/ lacunar infarcts in cerebellum & sm vessel dis> on ASA daily & she never tried the Axona; now c/o paresthesias in left foot & wants Neuro recheck, denies LBP etc...  Anxiety & ?depression> on Alprazolam prn & she is asked to take this more regularly (~1/2 Tid);  her GYN DrCousins has given her Lexapro for depression...    Patient's Medications  New Prescriptions   ROSUVASTATIN (CRESTOR) 5 MG TABLET    Take 5mg  by mouth on Monday, Wednesday and Friday  Previous Medications   ALPRAZOLAM (XANAX) 0.5 MG TABLET    Take 1/2 to 1 tablet by mouth three times daily as needed for nerves   ASPIRIN (ECOTRIN) 325 MG EC TABLET    Take 325 mg by mouth daily.     ERGOCALCIFEROL (VITAMIN D2) 50000 UNITS CAPSULE    Take 1 capsule (50,000 Units total) by mouth once a week.   LOSARTAN (COZAAR) 50 MG TABLET    Take 1 tablet (50 mg total) by mouth daily.   LOTEMAX 0.5 % OPHTHALMIC SUSPENSION    Place 1 drop into both eyes Daily.    OMEPRAZOLE (PRILOSEC) 40 MG CAPSULE    Take 1 capsule (40 mg total) by mouth daily.   OVER THE COUNTER MEDICATION    Take 1 tablet by mouth 3 (three) times daily. Pt takes domperidone that she gets  mail order from [place]   RIFAXIMIN (XIFAXAN) 200 MG TABLET  Take 200 mg by mouth 3 (three) times daily. 3 times a day as needed   ZOLPIDEM (AMBIEN) 10 MG TABLET    Take 1/2 - 1 tab by mouth at bedtime as needed for sleep.  Modified Medications   Modified Medication Previous Medication   DETROL LA 4 MG 24 HR CAPSULE DETROL LA 4 MG 24 hr capsule      TAKE ONE CAPSULE BY MOUTH EVERY NIGHT AT BEDTIME    TAKE ONE CAPSULE BY MOUTH EVERY NIGHT AT BEDTIME  Discontinued Medications   ESCITALOPRAM (LEXAPRO) 10 MG TABLET    Take 10 mg by mouth daily. As directed by Dr. Cherly Hensen   FISH OIL-OMEGA-3 FATTY ACIDS 1000 MG CAPSULE    Take 2 g by mouth daily.   FLAVORING AGENT (BLUEBERRY FLAVOR) POWD    by Does not apply route. Put 1 tsp in 10 oz of liquid daily   VITAMIN D, ERGOCALCIFEROL, (DRISDOL) 50000 UNITS CAPS    TAKE 1 CAPSULE BY MOUTH EVERY WEEK

## 2011-12-02 ENCOUNTER — Other Ambulatory Visit: Payer: Self-pay | Admitting: Pulmonary Disease

## 2011-12-09 NOTE — Progress Notes (Signed)
HPI  Paige Pena is a 71 yo F seen for initial visit in Lipid clinic at the request of Dr. Kriste Basque.  Reviewed pt's PMH.  She has a history of cerebrovascular disease but no CAD or DM.  She has HTN listed as a problem, but she is currently not taking any medications and all of her recent BPs within the office have been normal (115/72 today).  She is not a smoker and does not drink alcohol.  Her family history is non-contributory.  She did state her maternal grandparents had "hardening of the arteries" but no events that she was aware of. She does have fibromyalgia and biggest complaint is feeling tired.    Dietary review shows patient eats a very healthy diet.  She will have a fruit and spinach smoothie for breakfast and maybe oatmeal.  She make snack on baby bell cheese with 4 crackers or peanut butter and crackers.  She does not always eat lunch, depending on what time she gets up for the day.  Dinner is mostly vegetables such as beans, greens, salads and sweet potato.  She does not eat a lot of meat, but will eat chicken, fish or roast on occasion.    Pt does not have a regular exercise routine at the moment mainly due to changes in her vision related to a recent eye surgery.    Current Outpatient Prescriptions on File Prior to Visit  Medication Sig Dispense Refill  . aspirin (ECOTRIN) 325 MG EC tablet Take 325 mg by mouth daily.        Marland Kitchen DETROL LA 4 MG 24 hr capsule TAKE ONE CAPSULE BY MOUTH EVERY NIGHT AT BEDTIME  30 each  6  . ergocalciferol (VITAMIN D2) 50000 UNITS capsule Take 1 capsule (50,000 Units total) by mouth once a week.  12 capsule  3  . LOTEMAX 0.5 % ophthalmic suspension Place 1 drop into both eyes Daily.       Marland Kitchen omeprazole (PRILOSEC) 40 MG capsule Take 1 capsule (40 mg total) by mouth daily.  30 capsule  6  . OVER THE COUNTER MEDICATION Take 1 tablet by mouth 3 (three) times daily. Pt takes domperidone that she gets mail order from [place]      . rifaximin (XIFAXAN) 200 MG tablet  Take 200 mg by mouth 3 (three) times daily. 3 times a day as needed      . losartan (COZAAR) 50 MG tablet Take 1 tablet (50 mg total) by mouth daily.  30 tablet  5  . rosuvastatin (CRESTOR) 5 MG tablet Take 5mg  by mouth on Monday, Wednesday and Friday  28 tablet  0  . zolpidem (AMBIEN) 10 MG tablet TAKE  1/2 -1 TABLET BY MOUTH EVERY NIGHT AT BEDTIME AS NEEDED FOR SLEEP  30 tablet  5   Allergies  Allergen Reactions  . Metoclopramide Hcl     REACTION: anxious and out of her mind  . Morphine     REACTION: lethargy/malaise  . Penicillins     Rash, itching  . Prednisone     REACTION: rash/hives  . Sulfonamide Derivatives     REACTION: itching

## 2011-12-09 NOTE — Assessment & Plan Note (Signed)
Reviewed pt's Framingham risk.  Her 10-year risk is ~18%- but this is mostly due to age rather than any other issue.  Her cholesterol is fairly well controlled with lifestyle only.  TC- 226, TG- 53, HDL- 75, and LDL- 132.  Given risks, pt agrees will try to get to aggressive goal of LDL<100.  Will start on low dose Crestor 5mg  on Monday, Wednesday and Friday to see if we can get LDL slightly lower.  Also encouraged pt to start trying to do small amounts of exercise.  Will follow up in 6 weeks.

## 2012-01-12 ENCOUNTER — Other Ambulatory Visit: Payer: Medicare Other

## 2012-01-15 ENCOUNTER — Ambulatory Visit: Payer: Medicare Other | Admitting: Pharmacist

## 2012-01-26 ENCOUNTER — Other Ambulatory Visit: Payer: Self-pay | Admitting: Pharmacist

## 2012-01-26 DIAGNOSIS — E785 Hyperlipidemia, unspecified: Secondary | ICD-10-CM

## 2012-03-17 ENCOUNTER — Other Ambulatory Visit: Payer: Medicare Other

## 2012-03-18 ENCOUNTER — Ambulatory Visit: Payer: Medicare Other | Admitting: Pharmacist

## 2012-05-03 ENCOUNTER — Other Ambulatory Visit: Payer: Medicare Other

## 2012-05-10 ENCOUNTER — Ambulatory Visit: Payer: Medicare Other | Admitting: Pharmacist

## 2012-05-11 ENCOUNTER — Ambulatory Visit: Payer: Medicare Other | Admitting: Pulmonary Disease

## 2012-05-13 ENCOUNTER — Other Ambulatory Visit (INDEPENDENT_AMBULATORY_CARE_PROVIDER_SITE_OTHER): Payer: Medicare Other

## 2012-05-13 DIAGNOSIS — E7800 Pure hypercholesterolemia, unspecified: Secondary | ICD-10-CM

## 2012-05-13 LAB — LIPID PANEL
Cholesterol: 196 mg/dL (ref 0–200)
LDL Cholesterol: 113 mg/dL — ABNORMAL HIGH (ref 0–99)
VLDL: 9.8 mg/dL (ref 0.0–40.0)

## 2012-05-17 ENCOUNTER — Ambulatory Visit (INDEPENDENT_AMBULATORY_CARE_PROVIDER_SITE_OTHER): Payer: Medicare Other | Admitting: Pharmacist

## 2012-05-17 VITALS — Wt 127.6 lb

## 2012-05-17 DIAGNOSIS — E7800 Pure hypercholesterolemia, unspecified: Secondary | ICD-10-CM

## 2012-05-17 MED ORDER — ROSUVASTATIN CALCIUM 5 MG PO TABS: ORAL_TABLET | ORAL | Status: DC

## 2012-05-17 MED ORDER — ROSUVASTATIN CALCIUM 5 MG PO TABS: 5.0000 mg | ORAL_TABLET | Freq: Every day | ORAL | Status: DC

## 2012-05-17 NOTE — Patient Instructions (Signed)
1.  Add exercise at least at least 3 days week and increase to daily as you can 2.  Continue Crestor 5mg  Tuesday, Thursdays and Saturday for now but you may need to increase to daily in the future 3.  Continue to eat healthy foods 4.  Return for [facility]id Clinic appt in 3 months July 1 Tues at 10am 5.  Come for fasting blood work the week before appt

## 2012-05-17 NOTE — Progress Notes (Signed)
HPI  Paige Pena is a 72 yo f seen for follow up visit in Lipid clinic at the request of Dr. Kriste Basque.  Reviewed pt's PMH.  She has a history of cerebrovascular disease but no CAD or DM.  She has HTN listed as a problem, but she is currently not taking any medications and all of her recent BPs within the office have been normal.  She is not a smoker and does not drink alcohol.  Her family history is non-contributory.  She did state her maternal grandparents had "hardening of the arteries" but no events that she was aware of. She does have fibromyalgia and biggest complaint is feeling tired.  She also has vague aches and pains in her back and left leg.  She has seen a neurologist in the past but has not followed up recent;y with new vague complaints.  She also has a primary MD that she states she has not seen recently.    Dietary review shows patient eats a very healthy diet.  She will have a fruit and spinach smoothie for breakfast and maybe oatmeal.  She make snack on baby bell cheese with 4 crackers or peanut butter and crackers.  She does not always eat lunch, depending on what time she gets up for the day.  Dinner is mostly vegetables such as beans, greens, salads and sweet potato.  She does not eat a lot of meat, but will eat chicken, fish or roast on occasion.    Pt does not have a regular exercise routine at the moment she seems very unmotivated to do so but her daughters have encouraged her to exercise.   Current Outpatient Prescriptions on File Prior to Visit  Medication Sig Dispense Refill  . ALPRAZolam (XANAX) 0.5 MG tablet TAKE  1/2 -1 TABLET BY MOUTH THREE TIMES DAILY AS NEEDED  100 tablet  5  . aspirin (ECOTRIN) 325 MG EC tablet Take 325 mg by mouth daily.        Marland Kitchen DETROL LA 4 MG 24 hr capsule TAKE ONE CAPSULE BY MOUTH EVERY NIGHT AT BEDTIME  30 each  6  . ergocalciferol (VITAMIN D2) 50000 UNITS capsule Take 1 capsule (50,000 Units total) by mouth once a week.  12 capsule  3  . LOTEMAX 0.5  % ophthalmic suspension Place 1 drop into both eyes Daily.       Marland Kitchen omeprazole (PRILOSEC) 40 MG capsule Take 1 capsule (40 mg total) by mouth daily.  30 capsule  6  . OVER THE COUNTER MEDICATION Take 1 tablet by mouth 3 (three) times daily. Pt takes domperidone that she gets mail order from [place]      . zolpidem (AMBIEN) 10 MG tablet TAKE  1/2 -1 TABLET BY MOUTH EVERY NIGHT AT BEDTIME AS NEEDED FOR SLEEP  30 tablet  5  . rifaximin (XIFAXAN) 200 MG tablet Take 200 mg by mouth 3 (three) times daily. 3 times a day as needed       No current facility-administered medications on file prior to visit.   Allergies  Allergen Reactions  . Metoclopramide Hcl     REACTION: anxious and out of her mind  . Morphine     REACTION: lethargy/malaise  . Penicillins     Rash, itching  . Prednisone     REACTION: rash/hives  . Sulfonamide Derivatives     REACTION: itching

## 2012-05-17 NOTE — Assessment & Plan Note (Signed)
Tc 226> 196 (at goal < 200), TG 53>49 (at goal < 150) HDL 75>74 (at goal > 40) LDL 136>113 ( > goal < 100) Paige Pena is currently taking Crestor 5mg  Tus, Thur, Sat for her HDL.  She states compliance with current mediation regimen.  She does not want to increase her Crestor to daily.  She is willing to exercise and see if that helps with her LDL.  All other factors of lipid panel are within goal.   She has been hesitant to exercise in the past d/t many vague complaints of fatigue, and aches in lower back and upper left leg.  There is no aggravating factors or relieving factors for these pains.  She has seen a neurologist for similar pains in the past.  I have encouraged her to follow up with her neurologist.   She will continue to eat healthy.  She will start walking outside for 3 days a week and increase to daily as she feels better.  She also understands that we may increase Crestor in the future but she would like to try exercise first.

## 2012-06-01 ENCOUNTER — Telehealth: Payer: Self-pay | Admitting: *Deleted

## 2012-06-01 MED ORDER — MELOXICAM 15 MG PO TABS: 15.0000 mg | ORAL_TABLET | Freq: Every day | ORAL | Status: DC

## 2012-06-01 NOTE — Telephone Encounter (Signed)
I called patient. The patient continues to complain of left leg pain, and indicates that this is getting worse. The patient had an EMG study in April 2013, there was a questionable L5 radiculopathy on the study. The patient will be given another prescription for Mobic, and she never took this when it was given to her in September 2013. If the pain continues, MRI evaluation of the low back will need to be done.

## 2012-06-01 NOTE — Telephone Encounter (Signed)
Patient called stating she is still having pain in her left leg and its getting worse. (Patient didn't realize that Mobic was sent to her pharmacy on 10-30-11, so she hasn't taken the medication). Patient wants to speak with physician.

## 2012-06-02 ENCOUNTER — Telehealth: Payer: Self-pay | Admitting: *Deleted

## 2012-06-02 DIAGNOSIS — M545 Low back pain, unspecified: Secondary | ICD-10-CM

## 2012-06-02 NOTE — Telephone Encounter (Signed)
Patient called stating she would like to have an X-ray to determine problem.

## 2012-06-02 NOTE — Telephone Encounter (Signed)
I called patient. I left a message. The patient wishes to go ahead and get the MRI of the low back now. I'll get this set up for her.

## 2012-06-15 ENCOUNTER — Ambulatory Visit
Admission: RE | Admit: 2012-06-15 | Discharge: 2012-06-15 | Disposition: A | Discharge status: Home / Self Care | Payer: Medicare Other | Source: Ambulatory Visit | Attending: Neurology | Admitting: Neurology

## 2012-06-15 DIAGNOSIS — M79609 Pain in unspecified limb: Secondary | ICD-10-CM

## 2012-06-15 DIAGNOSIS — M545 Low back pain, unspecified: Secondary | ICD-10-CM

## 2012-06-17 ENCOUNTER — Telehealth: Payer: Self-pay | Admitting: Neurology

## 2012-06-17 NOTE — Telephone Encounter (Signed)
I called the patient. The MRI of the lumbosacral spine showed multilevel spondylosis with neuroforaminal stenosis at multiple levels. The patient likely has left-sided leg pain secondary to this. The patient has been on Mobic, and this seems to help. If she seemed to do well after 6 or 8 weeks on the medication, she can stop the Mobic. In the future, epidural steroid injections may be done.

## 2012-06-18 ENCOUNTER — Other Ambulatory Visit: Payer: Self-pay | Admitting: Pulmonary Disease

## 2012-07-06 ENCOUNTER — Other Ambulatory Visit: Payer: Self-pay | Admitting: Pulmonary Disease

## 2012-07-18 ENCOUNTER — Other Ambulatory Visit: Payer: Self-pay | Admitting: Pulmonary Disease

## 2012-08-05 ENCOUNTER — Other Ambulatory Visit: Payer: Self-pay | Admitting: Pulmonary Disease

## 2012-08-06 ENCOUNTER — Other Ambulatory Visit: Payer: Self-pay | Admitting: Pulmonary Disease

## 2012-08-11 ENCOUNTER — Other Ambulatory Visit: Payer: Self-pay | Admitting: Pulmonary Disease

## 2012-08-11 MED ORDER — ALPRAZOLAM 0.5 MG PO TABS: ORAL_TABLET | ORAL | Status: DC

## 2012-08-17 ENCOUNTER — Other Ambulatory Visit: Payer: Medicare Other

## 2012-08-17 ENCOUNTER — Ambulatory Visit: Payer: Medicare Other | Admitting: Pharmacist

## 2012-08-19 ENCOUNTER — Telehealth: Payer: Self-pay | Admitting: Pulmonary Disease

## 2012-08-19 ENCOUNTER — Other Ambulatory Visit: Payer: Self-pay | Admitting: Pulmonary Disease

## 2012-08-19 MED ORDER — ALPRAZOLAM 0.5 MG PO TABS: ORAL_TABLET | ORAL | Status: DC

## 2012-08-19 NOTE — Telephone Encounter (Signed)
Called and spoke with pt and she stated that she needs a refill of her alprazolam.  i have scheduled an appt for the pt with SN July 29 at 4.  Pt is aware that rx has been called to her pharmacy and she is aware of appt.

## 2012-08-24 ENCOUNTER — Ambulatory Visit: Payer: Medicare Other | Admitting: Pharmacist

## 2012-08-24 ENCOUNTER — Other Ambulatory Visit: Payer: Self-pay

## 2012-08-30 ENCOUNTER — Ambulatory Visit: Payer: Self-pay | Admitting: Pharmacist

## 2012-08-30 ENCOUNTER — Other Ambulatory Visit: Payer: Self-pay

## 2012-09-02 ENCOUNTER — Other Ambulatory Visit: Payer: Self-pay | Admitting: Pulmonary Disease

## 2012-09-06 ENCOUNTER — Other Ambulatory Visit (INDEPENDENT_AMBULATORY_CARE_PROVIDER_SITE_OTHER): Payer: Medicare Other

## 2012-09-06 ENCOUNTER — Telehealth: Payer: Self-pay | Admitting: Pulmonary Disease

## 2012-09-06 DIAGNOSIS — E7800 Pure hypercholesterolemia, unspecified: Secondary | ICD-10-CM

## 2012-09-06 LAB — HEPATIC FUNCTION PANEL
ALT: 13 U/L (ref 0–35)
Alkaline Phosphatase: 73 U/L (ref 39–117)
Bilirubin, Direct: 0 mg/dL (ref 0.0–0.3)
Total Bilirubin: 0.8 mg/dL (ref 0.3–1.2)
Total Protein: 7.1 g/dL (ref 6.0–8.3)

## 2012-09-06 LAB — LIPID PANEL
Cholesterol: 180 mg/dL (ref 0–200)
VLDL: 8.4 mg/dL (ref 0.0–40.0)

## 2012-09-06 MED ORDER — TOLTERODINE TARTRATE ER 4 MG PO CP24: ORAL_CAPSULE | ORAL | Status: DC

## 2012-09-06 NOTE — Telephone Encounter (Signed)
Pt aware RX has been sent. Nothing further was needed

## 2012-09-07 ENCOUNTER — Ambulatory Visit (INDEPENDENT_AMBULATORY_CARE_PROVIDER_SITE_OTHER): Payer: Medicare Other | Admitting: Pharmacist

## 2012-09-07 VITALS — Wt 123.5 lb

## 2012-09-07 DIAGNOSIS — E7800 Pure hypercholesterolemia, unspecified: Secondary | ICD-10-CM

## 2012-09-07 NOTE — Assessment & Plan Note (Signed)
Pt's cholesterol improved.  LDL at goal of <100.  LFTs are WNL.  Pt tolerating Crestor 3 days of the week.  No difference in pains.  Will continue current therapy and recheck in 6 months.  If labs at goal again, will have her follow up with Dr. Kriste Basque in the future.

## 2012-09-07 NOTE — Patient Instructions (Signed)
Your labs look much better.  Keep up the good work.  Continue Crestor 5mg  three days of the week.  Recheck labs in 6 months.

## 2012-09-07 NOTE — Progress Notes (Signed)
HPI  Paige Pena is a 72 yo f seen for follow up visit in Lipid clinic at the request of Dr. Kriste Basque.  Reviewed pt's PMH.  She has a history of cerebrovascular disease but no CAD or DM.  She has HTN listed as a problem, but she is currently not taking any medications and all of her recent BPs within the office have been normal.  She is not a smoker and does not drink alcohol.  Her family history is non-contributory.  She did state her maternal grandparents had "hardening of the arteries" but no events that she was aware of. She does have fibromyalgia and biggest complaint is feeling tired.  She also has vague aches and pains in her stomach.  Has a history of diverticulosis and takes Xifaxan periodically but has not seen GI recently because her MD is no longer with the practice.   Dietary review shows patient eats a very healthy diet.  She does state her appetite has been less recently.  She has lost 4 lbs since last visit without trying to do so.   Pt does not have a regular exercise routine at the moment.  States she just doesn't have the energy or feel good a lot of the days.   Current Outpatient Prescriptions on File Prior to Visit  Medication Sig Dispense Refill  . ALPRAZolam (XANAX) 0.5 MG tablet TAKE  1/2 -1 TABLET BY MOUTH THREE TIMES DAILY AS NEEDED  90 tablet  1  . aspirin (ECOTRIN) 325 MG EC tablet Take 325 mg by mouth daily.        . ergocalciferol (VITAMIN D2) 50000 UNITS capsule Take 1 capsule (50,000 Units total) by mouth once a week.  12 capsule  3  . LOTEMAX 0.5 % ophthalmic suspension Place 1 drop into both eyes Daily.       . meloxicam (MOBIC) 15 MG tablet Take 1 tablet (15 mg total) by mouth daily.  30 tablet  1  . omeprazole (PRILOSEC) 40 MG capsule TAKE ONE CAPSULE BY MOUTH DAILY  30 capsule  1  . OVER THE COUNTER MEDICATION Take 1 tablet by mouth 3 (three) times daily. Pt takes domperidone that she gets mail order from [place]      . rifaximin (XIFAXAN) 200 MG tablet Take 200 mg  by mouth 3 (three) times daily. 3 times a day as needed      . rosuvastatin (CRESTOR) 5 MG tablet Take 1 tablet (5 mg total) by mouth daily.  30 tablet  6  . tolterodine (DETROL LA) 4 MG 24 hr capsule TAKE ONE CAPSULE BY MOUTH EVERY NIGHT AT BEDTIME  30 capsule  0  . zolpidem (AMBIEN) 10 MG tablet TAKE 1/2 TO 1 TABLET BY MOUTH EVERY NIGHT AT BEDTIME AS NEEDED SLEEP  30 tablet  2  . [DISCONTINUED] DETROL LA 4 MG 24 hr capsule TAKE ONE CAPSULE BY MOUTH EVERY NIGHT AT BEDTIME  30 each  6   No current facility-administered medications on file prior to visit.   Allergies  Allergen Reactions  . Metoclopramide Hcl     REACTION: anxious and out of her mind  . Morphine     REACTION: lethargy/malaise  . Penicillins     Rash, itching  . Prednisone     REACTION: rash/hives  . Sulfonamide Derivatives     REACTION: itching

## 2012-09-08 ENCOUNTER — Other Ambulatory Visit: Payer: Self-pay | Admitting: Pulmonary Disease

## 2012-09-17 ENCOUNTER — Telehealth: Payer: Self-pay | Admitting: Pulmonary Disease

## 2012-09-17 DIAGNOSIS — F411 Generalized anxiety disorder: Secondary | ICD-10-CM

## 2012-09-17 DIAGNOSIS — E7800 Pure hypercholesterolemia, unspecified: Secondary | ICD-10-CM

## 2012-09-17 NOTE — Telephone Encounter (Signed)
Spoke with patient, patient would like to know if she can have her labs drawn prior to her appt on Tuesday July 29 Dr. Kriste Basque please advise on labs, thank you!

## 2012-09-20 NOTE — Telephone Encounter (Signed)
Pt aware orders placed. Nothing further was needed

## 2012-09-20 NOTE — Telephone Encounter (Signed)
Per SN--  Pt will need to have BMP, cbcd, tsh.  thanks

## 2012-09-21 ENCOUNTER — Encounter: Payer: Self-pay | Admitting: Pulmonary Disease

## 2012-09-21 ENCOUNTER — Other Ambulatory Visit (INDEPENDENT_AMBULATORY_CARE_PROVIDER_SITE_OTHER): Payer: Medicare Other

## 2012-09-21 ENCOUNTER — Ambulatory Visit (INDEPENDENT_AMBULATORY_CARE_PROVIDER_SITE_OTHER): Payer: Medicare Other | Admitting: Pulmonary Disease

## 2012-09-21 VITALS — BP 102/62 | HR 66 | Temp 98.5°F | Ht 61.0 in | Wt 124.8 lb

## 2012-09-21 DIAGNOSIS — R32 Unspecified urinary incontinence: Secondary | ICD-10-CM

## 2012-09-21 DIAGNOSIS — E7800 Pure hypercholesterolemia, unspecified: Secondary | ICD-10-CM

## 2012-09-21 DIAGNOSIS — M79609 Pain in unspecified limb: Secondary | ICD-10-CM

## 2012-09-21 DIAGNOSIS — M47817 Spondylosis without myelopathy or radiculopathy, lumbosacral region: Secondary | ICD-10-CM

## 2012-09-21 DIAGNOSIS — I679 Cerebrovascular disease, unspecified: Secondary | ICD-10-CM

## 2012-09-21 DIAGNOSIS — M1990 Unspecified osteoarthritis, unspecified site: Secondary | ICD-10-CM

## 2012-09-21 DIAGNOSIS — K3184 Gastroparesis: Secondary | ICD-10-CM

## 2012-09-21 DIAGNOSIS — R0789 Other chest pain: Secondary | ICD-10-CM

## 2012-09-21 DIAGNOSIS — M47816 Spondylosis without myelopathy or radiculopathy, lumbar region: Secondary | ICD-10-CM

## 2012-09-21 DIAGNOSIS — F411 Generalized anxiety disorder: Secondary | ICD-10-CM

## 2012-09-21 DIAGNOSIS — E559 Vitamin D deficiency, unspecified: Secondary | ICD-10-CM

## 2012-09-21 DIAGNOSIS — IMO0001 Reserved for inherently not codable concepts without codable children: Secondary | ICD-10-CM

## 2012-09-21 DIAGNOSIS — K219 Gastro-esophageal reflux disease without esophagitis: Secondary | ICD-10-CM

## 2012-09-21 DIAGNOSIS — K589 Irritable bowel syndrome without diarrhea: Secondary | ICD-10-CM

## 2012-09-21 DIAGNOSIS — M79605 Pain in left leg: Secondary | ICD-10-CM

## 2012-09-21 DIAGNOSIS — K5730 Diverticulosis of large intestine without perforation or abscess without bleeding: Secondary | ICD-10-CM

## 2012-09-21 LAB — CBC WITH DIFFERENTIAL/PLATELET
Basophils Absolute: 0 10*3/uL (ref 0.0–0.1)
Basophils Relative: 0.4 % (ref 0.0–3.0)
Eosinophils Absolute: 0 10*3/uL (ref 0.0–0.7)
Hemoglobin: 14.1 g/dL (ref 12.0–15.0)
MCHC: 33 g/dL (ref 30.0–36.0)
MCV: 91.8 fl (ref 78.0–100.0)
Monocytes Absolute: 0.5 10*3/uL (ref 0.1–1.0)
Neutro Abs: 4.1 10*3/uL (ref 1.4–7.7)
Neutrophils Relative %: 69.4 % (ref 43.0–77.0)
RBC: 4.66 Mil/uL (ref 3.87–5.11)
RDW: 12.8 % (ref 11.5–14.6)

## 2012-09-21 LAB — BASIC METABOLIC PANEL
BUN: 13 mg/dL (ref 6–23)
Creatinine, Ser: 1.1 mg/dL (ref 0.4–1.2)
GFR: 66.01 mL/min (ref >60.00)

## 2012-09-21 MED ORDER — AXONA PO PACK: PACK | ORAL | Status: DC

## 2012-09-21 MED ORDER — ALPRAZOLAM 0.5 MG PO TABS: ORAL_TABLET | ORAL | Status: DC

## 2012-09-21 MED ORDER — OMEPRAZOLE 40 MG PO CPDR: DELAYED_RELEASE_CAPSULE | ORAL | Status: DC

## 2012-09-21 MED ORDER — TOLTERODINE TARTRATE ER 4 MG PO CP24: ORAL_CAPSULE | ORAL | Status: DC

## 2012-09-21 MED ORDER — ZOLPIDEM TARTRATE 10 MG PO TABS: ORAL_TABLET | ORAL | Status: DC

## 2012-09-21 MED ORDER — ERGOCALCIFEROL 1.25 MG (50000 UT) PO CAPS: 50000.0000 [IU] | ORAL_CAPSULE | ORAL | Status: DC

## 2012-09-21 NOTE — Patient Instructions (Addendum)
Today we updated your med list in our EPIC system...    Continue your current medications the same...  We re-wrote the prescription from [facility] as discussed...  Call for any questions...  Let's plan a follow up visit in 36mo, sooner if needed for problems.Marland KitchenMarland Kitchen

## 2012-09-21 NOTE — Progress Notes (Addendum)
Subjective:    Patient ID: Paige Pena, female    DOB: 03-04-1940, 72 y.o.   MRN: [id number]  HPI 72 y/o BF here for a follow up visit... she has multiple medical problems as noted below... Her daughter is a Publishing rights manager.  ~  January 22, 2011:  80mo ROV & she is unchanged clinically> not feeling well, c/o stomach problems, sluggish, etc... She went to the ER 8/12 w/ "palpit & memory loss", exam neg & no neuro deficits, Labs ok, CT Brain neg w/o acute changes & just some sm vessel dis, given Ativan & advised f/u primary MD... She has seen DrWillis in past for Neuro> he rec Axona but she wouldn't take it preferring a brain supplement she saw on TV...    She had Cards f/u DrHSmith 9/12> c/o palpit, he noted neg prior cardiac work up w/o ischemia on Myoview 2008 & no CAD on cath 1997; he noted normal EKG & ordered Holter monitor    She had GI f/u DrBarritt & Orman at [facility] 9/12> IBS, delayed gastric emptying, nausea, bloating, fecal incont> on Omep40 & Domperidone 10mg Tid; they suspected sm bowel bact overgrowth that responded prev to rifaximin (Xifaxan Rx written) & consider anorectal biofeedback; she was quite anxious & wanted to know when her next Colonoscopy was due (last 2006 was neg & f/u due 2016)...  ~  May 15, 2011:  80mo ROV & here for urgent add-on appt  For the same old symptoms it appears> c/o not feeling well, bloating, blood in stool, not emptying her bowels well, and "alot of little issues like pain in left leg from knee to groin, numbness in left foot, light headed, & throbbing in her head... Review of interval EPIC records shows ER visit 12/12 w/ UTI (mult species), and she's had 4 CT scans of her head + MRI/MRA all in the last year... She is most concerned about the left foot numbness which she feels is getting worse & wants a Neurology appt w/ DrWillis for further eval...    Hx AB> states she occ "feels tight" in chest, comes & goes, esp lying down at night, "it whistles & pushes  itself out"; I rec that she takes her Xanax each eve; states DrDeveshwar put her on Ambien Qhs for her FM & she takes 1/2...    Hx AtypCP & palpit> on ToprolXL25- 1/2 tab daily> BP= 122/72 today & she denies any recent CP, palpit, ch in SOB, edema...    Cerebrovasc dis> on ASA 325mg /d; hx remote lacunar infarcts & known sm vessel dis; no acute cerebral ischemic symptoms...    Chol> on diet alone & reasonably well controlled; see labs below...    GI> followed by DrStark here, DrOrman at [facility] GERD, LER, Gastroparesis, IBS, bloating, globus, fecal incont; on Omep40, Domperidone10Qid, & recently given another course of Xifaxan by DrOrman for bloating & poss bact overgrowth; she tells me they are planning another colonoscopy soon.  ~  November 11, 2011:  76mo ROV & Myelle persists w/ mult somatic complaints- "where do I begin?" she says> c/o recent eye surg, feeling tired and achey, resting poorly even w/ prn ambien, "everything happens on my left side" eg- slight headache noted, feels bloated, wants to start low impact aerobics, etc...    She reports tear duct & lower lid surg (ectropion)- now improved, "I was all tensed up about that"- note remote hx of corneal transplant...     She called 09/26/11 w/ concern for her BP &  we suggested adding Cozaar50 but she never picked this med up from the pharm & now she states that her pressure has ALWAYS been good except one time & she doesn't need meds for that.    She had f/u colonoscopy at [facility] 5/13> diverticulosis only, no polyps or lesions...    She saw DrWillis 4/13 for numbness & discomfort in left leg> EMG/ NCV was essentially neg & no meds needed, given samples of Axona... We reviewed prob list, meds, xrays and labs> see below for updates >> OK 2013 Flu vaccine today... LABS 8/13:  Chems- wnl;  CBC- wnl;  TSH= 2.75 LABS 9/13 showed FLP w/ TChol 226, TG 53, HDL 76, LDL 133  ~  September 21, 2012:  67mo ROV & Kerrilynn presents w/ mult persistent somatic complaints>  fibrofog, fatigue, GERD, LER symptoms, IBS, foot pain 7 requesting podiatrist referral... We reviewed the following medical problems during today's office visit >>     Hx AB> states she occ "feels tight" in chest, comes & goes, esp lying down at night, "it whistles & pushes itself out"; I rec that she takes her Xanax each eve; states DrDeveshwar put her on Ambien Qhs for her FM & she takes 1/2...    Hx AtypCP & palpit> on ToprolXL25- 1/2 tab daily> BP= 122/72 today & she denies any recent CP, palpit, ch in SOB, edema...    Cerebrovasc dis> on ASA 325mg /d; hx remote lacunar infarcts & known sm vessel dis; no acute cerebral ischemic symptoms...    Chol> on Cres5MWF per lipid clinic; FLP 7/14 showed TChol 180, TG 42, HDL 79, LDL 93; continue same med...    GI> followed by DrStark here, DrOrman at [facility] GERD, LER, Gastroparesis, Divertics, IBS, bloating, globus, fecal incont> on Omep40, Domperidone10Qid, & Xifaxan prn by DrOrman for bloating & poss bact overgrowth; She had f/u colonoscopy at [facility] 5/13> diverticulosis only, no polyps or lesions...    GU- Urinary incont> on DetrolLA4mg Qhs;     DJD, DDD, FM, etc> DrWillis did MRI Lumbar sp 4/14 w/ bulging discs & facet hypertophy w/ spinal stenosis & foraminal stenosis at mult levels; BMD performed at [facility] 1/14 shiowed lowest measured TScore -1.0 in right FemNeck    NEURO> sm vessel dis, old lacunar infarcts, mild atrophy, c/o poor memory> She saw DrWillis 4/13 for numbness & discomfort in left leg> EMG/ NCV was essentially neg & no meds needed, given samples of Axona which she never took;     Anxiety> she is rec to try Alprazolam 0.5mg - 1/2 tab tid regularly We reviewed prob list, meds, xrays and labs> see below for updates >>  LABS 7/14:  FLP- at goals on Cres5MWF;  Chems- wnl;  CBC- wnl;  TSH=1.19...  CXR 8/14 showed norm heart size, clear lungs, degen arthritis in spine, NAD...          Problem List:    Hx of DYSPHONIA (NWG-956.21) - She has seen ENT-  DrTeoh on several occas w/ cerumen impactions, sensorineural hearing loss, dysphonia & globus phenomenon (the latter felt secondary to GERD)... laryngoscopy was neg... she was maintained on Zegerid 40mg  Bid & switched to OMEPRAZOLE 40mg /d for $$ reasons... ~  2010:  referred by [facility] to their ENT Dept for voice therapy w/ eval by DrBuckmire- dysphonia & vocal fold weakness, plus an epithelial lesion that was biopsied & benign... ~  2012:  Further eval by [facility] GI & ENT Depts> bilat pyriform plaques (r/o candida & rx w/ Nystatin oral-  resolved) & vocal tremor; she continues on PPI rx for LER; they did CT scan & pt reports that this was neg; they rec voice therapy but she declined... ~  11/12: she reports that voice, throat are better; rec to continue the PPI & antireflux regimen...  R/O SLEEP APNEA (ICD-780.57) - sleep study 4/08 showed RDI= 6 & some restless leg symptoms (tried Requip)... DrDeveshwar has tried Provigil for Idiopathic Hypersomnolence...  Hx of ASTHMATIC BRONCHITIS, ACUTE (ICD-466.0) - no recent URIs reported... ~  CXR 1/12 showed norm heart size, tort aorta, clear lungs, DJD Tspine & mild scoliosis... ~  CXR 8/14 showed norm heart size, clear lungs, degen arthritis in spine, NAD...   Hx of ATYPICAL CHEST PAIN, & PALPITATIONS >> Cardiac eval by DrHSmith... ~  cardiac cath 1997 showed normal coronaries ~  2DEcho 9/03 showed mild conc LVH, norm EF, mild AoV sclerosis, mild thickening MV leaflets, mild MR... ~  NuclearStressTest 11/08 was WNL- no ischemia, no infarct, EF= 79%... ~  7/12:  She requests cardiac eval from DrHSmith for reassurance regarding palpitation; seen 9/12 w/ norm EKG & Holter done (we don't have results)... ~  9/13:  BP= 132/78 & she gets 120-130/70's at home... ~  7/14:  BP= 102/62 & she has mult somatic complaints as noted...  CEREBROVASCULAR DISEASE (ICD-437.9) - on ASA 325mg /d... she was eval by DrStiefel for yrs ago & DrWillis more recently... ~   MRI Brain 6/06 showed remote lacunar infarcts in the cerebellum bilaterally, chr small vessel dis, NAD... ~  2011:  Neuro eval by DrWillis for memory disturbance & report of transient aphasia> we don't have the scan report but his note indicates MRI relatively unremarkable & EEG was WNL; rec ASA & Axona (she never tried it). ~  Seen in ER 8/12 w/ palpit & memory loss> CT Brain was neg x sm vessel dis...  HYPERCHOLESTEROLEMIA (ICD-272.0)0 - on diet alone... ~  FLP 11/08 showed TChol 212, TG 57, HDL 68, LDL 132 ~  FLP 1/10 showed TChol 207, TG 62, HDL 64, LDL 127... she prefers to continue diet management. ~  FLP 3/11 showed TChol 238, TG 61, HDL 71, LDL 154... she is reluctant to consider Statin Rx due to side effects. ~  FLP 11/11 showed TChol 221, TG 54, HDL 66, LDL 129 ~  FLP 5/12 showed TChol 171, TG 56, HDL 59, LDL 101... rec to continue diet & exercise program... ~  FLP 9/13 showed TChol 226, TG 53, HDL 76, LDL 133 ~  FLP 7/14 on Cres5MWF showed TChol 180, TG 42, HDL 79, LDL 93   GERD (ICD-530.81), & ESOPHAGEAL STRICTURE (ICD-530.3) - on OMEPRAZOLE 40mg /d now + off prev Carafate Rx... ~  EGD 7/07 by DrStark showed GERD & stricture- dilated... ~  pt seen by GI/ DrStark Jun09- outlined plans for Rx w/ Zegerid, Domperidone, Carafate... ~  2010: she had 2nd opinion consult [facility] DrOrman & Piedad Climes- mult GI complaints w/ IBS, delayed gastric emptying, hx dysphagia, globus sensation, & fecal incontinence... treated w/ PPI, Dromperidone, Remeron, & Voice therapy w/ referral to ENT Dept. ~  2011:  Further eval & testing at [facility] (we don't have records from GI)... ~  9/12:  She had f/u [facility] DrBarritt & Orman> no changes made, they rec Ativan Tid...  GASTROPARESIS (ICD-536.3) - tried REGLAN- discontinued due to reaction w/ anxiety... DrStark added DOMPERIDONE 10mg  Qid & she still takes this... ~  Gastric Emptying Scan 10/08 showed 93% activity remaining in the stomach at  60 min, and 75% remaining  at 2H (normal is <30% at [facility])...  DIVERTICULOSIS OF COLON (ICD-562.10),  IRRITABLE BOWEL SYNDROME (ICD-564.1), & HEMORRHOIDS (ICD-455.6) -  ~  CT Abd & Pelvis in 2001 was WNL... ~  colonoscopy 9/06 by DrStark showed divertics and int hems... she had spasm in the sigmoid region. ~  Extensive GI evals from DrStark here, DrOrman at [facility] GERD, LER, Gastroparesis, IBS, bloating, globus, fecal incont; on Omep40, Domperidone10Qid, & recently given another course of Xifaxan by DrOrman for bloating & poss bact overgrowth; she tells me they are planning another colonoscopy soon. ~  She had f/u colonoscopy at [facility] 5/13> diverticulosis only, no polyps or lesions...  ~  Last note from [facility] was 6/13> IBS, delayed gastric emptying, nausea, bloating, fecal incont; rec to continue PPI, use Xifaxan as needed, biofeedback to help w/ evacuation...  URINARY INCONTINENCE (ICD-788.30) - on DETROL LA 4mg /d...  HEMATURIA, MICROSCOPIC, HX OF (ICD-V13.09)  DEGENERATIVE JOINT DISEASE (ICD-715.90) - she has some DDD in lumbar spine and osteoarthritis in knees... ~  5/12:  She called w/ leg discomfort & some subjective swelling on left side> VenDopplers were neg/ normal bilaterally... ~  BMD performed at [facility] 1/14 shiowed lowest measured TScore -1.0 in right [facility] ~  MRI Lumbar spine 4/14 w/ bulging discs & facet hypertophy w/ spinal stenosis & foraminal stenosis at mult levels => managed by DrWillis.  FIBROMYALGIA (ICD-729.1) - followed by DrDeveshwar for Rheum... c/o insomnia, fatigue, fogginess, and mult somatic complaints "I stay tired"... tried Ambien, Provigil, etc... ~  5/12:  She has mult somatic complaints; routine lab showed Sed rate= 61 ?etiology & she is referred back to [facility] for further eval... ~  7/12:  DrDeveshwar referred her back here & serial Sed rates= 61, 34, 16 (now wnl)...  NEUROLOGY>> see above Cerebrovasc Dis ~  She's had recurrent & long standing complaints of poor memory & has seen  DrStiefel & DrWillis... ~  MRI/ MRA Brain 1/12 showed mild age appropriate atrophy & sm vessel dis, sm bilat cerebellar infarcts, dolichoectasia of the ICAs L>R... ~  CT Head 12/12 showed mild sm vessel dis, sm chr lacunar infarcts in cerebellar hemispheres bilat, NAD... ~  3/13: c/o numbness in left foot & toes w/ pain in left leg from knee to groin; she is concerned & wants further eval- referred to Neuro. ~  She continues her f/u w/ Neuro- DrWillis  ANXIETY (ICD-300.00) - prev tried on ALPRAZOLAM 0.5mg - 1/2 to 1 tab three times a day as needed (but she rarely takes it)... DrDarling at [facility] 15mg  for sleep (no longer taking this)... DrCousins, GYN Rx w/ Lexapro (pt says she stopped this med on her own)... Now on AMBIEN 10mg  prn sleep.  VITAMIN D DEFICIENCY (ICD-268.9) - on Vit D 50K weekly... ~  Vit D level 1/10 = 15 (30-90) therefore started on Vit D 50,000 u weekly... ~  labs 3/11 showed Vit D level = 39 ~  labs 11/11 showed Vit D level = 59... continue 50K weekly at her request..  HEALTH MAINTENANCE>  GYN= DrCousins w/ PAP Q15yrs, seen 3/11 & started on Lexapro...  Mammograms at [facility]- done in 2009 neg x cysts... BMD at [facility] ~78yrs ago per pt (we only have copy of BMD from 2002= WNL w/ pos TScores)...   Past Surgical History  Procedure Laterality Date  . Abdominal hysterectomy  1995  . Corneal transplant for keratonconus    . Right bunion surgery    .  Left foot fracture with orif  07/2004    Dr. Lajoyce Corners    Outpatient Encounter Prescriptions as of 09/21/2012  Medication Sig Dispense Refill  . ALPRAZolam (XANAX) 0.5 MG tablet TAKE  1/2 -1 TABLET BY MOUTH THREE TIMES DAILY AS NEEDED  90 tablet  1  . aspirin (ECOTRIN) 325 MG EC tablet Take 325 mg by mouth daily.        . ergocalciferol (VITAMIN D2) 50000 UNITS capsule Take 1 capsule (50,000 Units total) by mouth once a week.  12 capsule  3  . LOTEMAX 0.5 % ophthalmic suspension Place 1 drop into both eyes Daily.       . meloxicam  (MOBIC) 15 MG tablet Take 1 tablet (15 mg total) by mouth daily.  30 tablet  1  . omeprazole (PRILOSEC) 40 MG capsule TAKE ONE CAPSULE BY MOUTH DAILY  30 capsule  1  . OVER THE COUNTER MEDICATION Take 1 tablet by mouth 3 (three) times daily. Pt takes domperidone that she gets mail order from [place]      . rifaximin (XIFAXAN) 200 MG tablet Take 200 mg by mouth 3 (three) times daily. 3 times a day as needed      . rosuvastatin (CRESTOR) 5 MG tablet Take 1 tablet (5 mg total) by mouth daily.  30 tablet  6  . tolterodine (DETROL LA) 4 MG 24 hr capsule TAKE 1 CAPSULE BY MOUTH EVERY NIGHT AT BEDTIME  30 capsule  0  . zolpidem (AMBIEN) 10 MG tablet TAKE 1/2 TO 1 TABLET BY MOUTH EVERY NIGHT AT BEDTIME AS NEEDED SLEEP  30 tablet  2   No facility-administered encounter medications on file as of 09/21/2012.    Allergies  Allergen Reactions  . Metoclopramide Hcl     REACTION: anxious and out of her mind  . Morphine     REACTION: lethargy/malaise  . Penicillins     Rash, itching  . Prednisone     REACTION: rash/hives  . Sulfonamide Derivatives     REACTION: itching    Review of Systems         See HPI - all other systems neg except as noted... The patient complains of hoarseness, dyspnea on exertion, abdominal discomfort, gas/bloating, and muscle weakness.  The patient denies anorexia, fever, weight loss, weight gain, vision loss, decreased hearing, chest pain, syncope, peripheral edema, prolonged cough, headaches, hemoptysis, melena, hematochezia, severe indigestion/heartburn, hematuria, incontinence, suspicious skin lesions, transient blindness, difficulty walking, depression, unusual weight change, abnormal bleeding, enlarged lymph nodes, and angioedema.     Objective:   Physical Exam     WD, WN, 72 y/o BF in NAD, she appears anxious & has mult +trigger points. GENERAL:  Alert & oriented; pleasant & cooperative... HEENT:  [place]/AT, EOM-wnl, PERRLA, EACs-clear, TMs-wnl, NOSE-clear, THROAT-clear  & wnl. NECK:  Supple w/ fairROM; no JVD; normal carotid impulses w/o bruits; no thyromegaly or nodules palpated; no lymphadenopathy. CHEST:  Clear to P & A; without wheezes/ rales/ or rhonchi heard... HEART:  Regular Rhythm; without murmurs/ rubs/ or gallops detected... ABDOMEN:  Soft & only min tender in mid-abd; normal bowel sounds; no organomegaly or masses palpated... EXT: without deformities, mild arthritic changes; no varicose veins/ venous insuffic/ or edema. NEURO:  CN's intact; motor testing normal; sensory testing normal; gait normal & balance OK. DERM:  no lesions seen...  RADIOLOGY DATA:  Reviewed in the EPIC EMR & discussed w/ the patient...  LABORATORY DATA:  Reviewed in the EPIC EMR & discussed w/  the patient...   Assessment & Plan:    Mult somatic complaints referable to the GI, Rheum, & Neuro systems; these are on-going, occ fluctuate, & seem unrelated...  DYSPHONIA>  ENT eval at [facility] is on-going & she is on antireflux regimen & PPI; rec to continue regular f/u w/ ENT & follow their recommendations; treated w/ Nystatin for candida & resolved; CT scan is reported neg; they suggested voice therapy but she declined...  PALPITATIONS>  She has hx atypCP w/ neg cath 1997 & neg Myoview2008; she is asked to avoid caffeine etc and take the Alprazolam regularly; she had Cards eval from DrHSmith & encouraged to f/u w/ him...  CHOL>  FLP improved on Cres5MWF via Lipid clinic- she is encouraged to continue...  GI>  Extensive GI evals in [place] & [facility] as noted> GERD, Hx stricture, Gastroparesis, Divertics, IBS, Hems> currently only taking Omep40mg /d w/ prev regimen outlined above; she is rec to f/u w/ her gastroenterologist at [facility] follow their recommendations...  RHEUM>  FM/ OA/ DDD lumbar spine, etc>  Followed by DrDeveshwar & recent eval did not shed light on etiology of transiently elev sed rate; f/u Sed rates have been normal...  NEURO>  followed by DrWillis w/ hx memory  disturbance, ?transient aphasia, prev MRI w/ lacunar infarcts in cerebellum & sm vessel dis> on ASA daily & she never tried the Axona; now c/o paresthesias in left foot & wants Neuro recheck, denies LBP etc...  Anxiety & ?depression> on Alprazolam prn & she is asked to take this more regularly (~1/2 Tid);  her GYN DrCousins has given her Lexapro for depression...    Patient's Medications  New Prescriptions   DIETARY MANAGEMENT PRODUCT (AXONA) PACKET    Take as directed   FLUTICASONE (FLONASE) 50 MCG/ACT NASAL SPRAY    Place 2 sprays into the nose daily.  Previous Medications   ASPIRIN (ECOTRIN) 325 MG EC TABLET    Take 325 mg by mouth daily.     LOTEMAX 0.5 % OPHTHALMIC SUSPENSION    Place 1 drop into both eyes Daily.    MELOXICAM (MOBIC) 15 MG TABLET    Take 1 tablet (15 mg total) by mouth daily.   OVER THE COUNTER MEDICATION    Take 1 tablet by mouth 3 (three) times daily. Pt takes domperidone that she gets mail order from [place]   RIFAXIMIN (XIFAXAN) 200 MG TABLET    Take 200 mg by mouth 3 (three) times daily. 3 times a day as needed  Modified Medications   Modified Medication Previous Medication   ALPRAZOLAM (XANAX) 0.5 MG TABLET ALPRAZolam (XANAX) 0.5 MG tablet      TAKE  1/2 -1 TABLET BY MOUTH THREE TIMES DAILY AS NEEDED    TAKE  1/2 -1 TABLET BY MOUTH THREE TIMES DAILY AS NEEDED   ERGOCALCIFEROL (VITAMIN D2) 50000 UNITS CAPSULE ergocalciferol (VITAMIN D2) 50000 UNITS capsule      Take 1 capsule (50,000 Units total) by mouth once a week.    Take 1 capsule (50,000 Units total) by mouth once a week.   OMEPRAZOLE (PRILOSEC) 40 MG CAPSULE omeprazole (PRILOSEC) 40 MG capsule      TAKE ONE CAPSULE BY MOUTH DAILY    TAKE ONE CAPSULE BY MOUTH DAILY   ROSUVASTATIN (CRESTOR) 5 MG TABLET rosuvastatin (CRESTOR) 5 MG tablet      Take 5 mg by mouth daily. Only 3 days per week    Take 1 tablet (5 mg total) by mouth daily.   TOLTERODINE (  DETROL LA) 4 MG 24 HR CAPSULE tolterodine (DETROL LA) 4 MG 24 hr  capsule      TAKE 1 CAPSULE BY MOUTH EVERY NIGHT AT BEDTIME    TAKE 1 CAPSULE BY MOUTH EVERY NIGHT AT BEDTIME   ZOLPIDEM (AMBIEN) 10 MG TABLET zolpidem (AMBIEN) 10 MG tablet      TAKE 1/2 TO 1 TABLET BY MOUTH EVERY NIGHT AT BEDTIME AS NEEDED SLEEP    TAKE 1/2 TO 1 TABLET BY MOUTH EVERY NIGHT AT BEDTIME AS NEEDED SLEEP  Discontinued Medications   No medications on file

## 2012-09-22 ENCOUNTER — Telehealth: Payer: Self-pay | Admitting: Pulmonary Disease

## 2012-09-22 MED ORDER — FLUTICASONE PROPIONATE 50 MCG/ACT NA SUSP: 2.0000 | Freq: Every day | NASAL | Status: DC

## 2012-09-22 NOTE — Telephone Encounter (Signed)
LMTCB x1 for pt--RX sent 

## 2012-09-22 NOTE — Telephone Encounter (Signed)
Per SN---  Ok to refill the flonase nasal spray.  thanks

## 2012-09-22 NOTE — Telephone Encounter (Signed)
Spoke with patient Patient requesting a refill on fluticasone nasal spray I do not see where this has been prescribed in the past Dr. Kriste Basque please advise if Rx can be sent in to [facility] [address]  Last OV:09/21/12 w 6 month f/u not scheduled yet

## 2012-09-23 NOTE — Telephone Encounter (Signed)
Pt advised. Charne Mcbrien, CMA  

## 2012-10-07 ENCOUNTER — Ambulatory Visit (INDEPENDENT_AMBULATORY_CARE_PROVIDER_SITE_OTHER)
Admission: RE | Admit: 2012-10-07 | Discharge: 2012-10-07 | Disposition: A | Discharge status: Home / Self Care | Payer: Medicare Other | Source: Ambulatory Visit | Attending: Pulmonary Disease | Admitting: Pulmonary Disease

## 2012-10-07 DIAGNOSIS — R0789 Other chest pain: Secondary | ICD-10-CM

## 2012-10-13 ENCOUNTER — Telehealth: Payer: Self-pay | Admitting: Pulmonary Disease

## 2012-10-13 ENCOUNTER — Encounter: Payer: Self-pay | Admitting: Pulmonary Disease

## 2012-10-13 NOTE — Telephone Encounter (Signed)
Called spoke with patient, advised of cxr results as stated by SN below.  Pt verbalized her understanding.  Pt also asked about mychart but cannot locate her patient instructions from last ov w/ SN Unable to print mycahrt info at this time for patient; emailed info to pt's verified personal email.  Advised pt to call if she does not receive. Nothing further needed at this time; will sign off.  Result Notes    Notes Recorded by Michele Mcalpine, MD on 10/10/2012 at 9:09 AM Please notify patient>  CXR is clear & WNL.Marland KitchenMarland Kitchen

## 2012-11-15 ENCOUNTER — Other Ambulatory Visit: Payer: Self-pay | Admitting: Pulmonary Disease

## 2012-11-18 ENCOUNTER — Telehealth: Payer: Self-pay | Admitting: Pulmonary Disease

## 2012-11-18 NOTE — Telephone Encounter (Signed)
I called [facility] and they have the rx ready for the pt. I LMTCBx1 to advise the pt. Carron Curie, CMA

## 2012-11-19 NOTE — Telephone Encounter (Signed)
Left message on VM advising this has been sent to the pharmacy

## 2013-02-10 ENCOUNTER — Telehealth: Payer: Self-pay | Admitting: Neurology

## 2013-02-10 NOTE — Telephone Encounter (Signed)
Patient called stating that she has been having problems with her left leg extending form her upper thigh. Patient states she has trouble walking and it feels heavy, as well as feeling weak when she stands on it. Please call the patient.

## 2013-02-10 NOTE — Telephone Encounter (Signed)
I called patient. The patient has had a several year history of some left leg discomfort, but she believes that this is a bit more prominent, and she now feels as if the leg is weak, heavy. The patient has had EMG and nerve conduction study evaluation previously shown some slight denervation of the bicep femoris muscle. The patient will be set up for a revisit.

## 2013-02-11 ENCOUNTER — Telehealth: Payer: Self-pay | Admitting: Neurology

## 2013-02-14 ENCOUNTER — Other Ambulatory Visit: Payer: Self-pay | Admitting: Neurology

## 2013-02-15 ENCOUNTER — Ambulatory Visit (INDEPENDENT_AMBULATORY_CARE_PROVIDER_SITE_OTHER): Payer: Medicare Other | Admitting: Neurology

## 2013-02-15 ENCOUNTER — Encounter: Payer: Self-pay | Admitting: Neurology

## 2013-02-15 ENCOUNTER — Encounter (INDEPENDENT_AMBULATORY_CARE_PROVIDER_SITE_OTHER): Payer: Self-pay

## 2013-02-15 VITALS — BP 131/69 | HR 78 | Wt 124.0 lb

## 2013-02-15 DIAGNOSIS — R4189 Other symptoms and signs involving cognitive functions and awareness: Secondary | ICD-10-CM | POA: Insufficient documentation

## 2013-02-15 DIAGNOSIS — R413 Other amnesia: Secondary | ICD-10-CM

## 2013-02-15 DIAGNOSIS — M47817 Spondylosis without myelopathy or radiculopathy, lumbosacral region: Secondary | ICD-10-CM

## 2013-02-15 DIAGNOSIS — M47816 Spondylosis without myelopathy or radiculopathy, lumbar region: Secondary | ICD-10-CM

## 2013-02-15 HISTORY — DX: Other amnesia: R41.3

## 2013-02-15 MED ORDER — AXONA PO PACK: 40.0000 g | PACK | Freq: Every day | ORAL | Status: DC

## 2013-02-15 MED ORDER — MELOXICAM 15 MG PO TABS: 15.0000 mg | ORAL_TABLET | Freq: Every day | ORAL | Status: DC

## 2013-02-15 NOTE — Progress Notes (Signed)
Reason for visit: Leg pain  Paige Pena is an 72 y.o. female  History of present illness:  Paige Pena is a 72 year old right-handed black female with a history of a mild memory disturbance. The patient was given a prescription for Axona, but she never started it. The patient comes in with some worsening problems with her left leg, hip, and foot discomfort. The patient feels as if the leg and foot are heavy at times. The patient may have some numbness in the foot. The patient has no true weakness, but she feels as if the leg will not support her at times. The patient comes to this office for further evaluation. A prior lumbosacral spine MRI done in April 2014 showed multilevel spondylosis and neuroforaminal stenosis. The patient is on Mobic which does help some. The patient has not had epidural steroid injections. The patient returns for an evaluation.  Past Medical History  Diagnosis Date  . Dysphonia   . Asthmatic bronchitis   . Chest pain, atypical   . Cerebrovascular disease   . Hypercholesteremia   . GERD (gastroesophageal reflux disease)   . Acute cystitis   . Esophageal stricture   . Gastroparesis   . Diverticulosis of colon   . IBS (irritable bowel syndrome)   . Hemorrhoids   . Urinary incontinence   . Hematuria, microscopic   . DJD (degenerative joint disease)   . Fibromyalgia   . Anxiety   . Vitamin D deficiency   . RLS (restless legs syndrome)   . Aphasia     transsient aphasia Jan 2012  . Memory loss   . Memory deficits 02/15/2013    Past Surgical History  Procedure Laterality Date  . Abdominal hysterectomy  1995  . Corneal transplant for keratonconus    . Right bunion surgery    . Left foot fracture with orif  07/2004    Dr. Lajoyce Corners    Family History  Problem Relation Age of Onset  . Breast cancer      aunt  . Diabetes      aunt,brother,uncle  . Kidney disease      father  . Hyperlipidemia Sister   . Diabetes Brother   . Hypertension Mother     . Anemia Mother   . Kidney disease Father   . Dementia Maternal Grandmother     Social history:  reports that she has never smoked. She has never used smokeless tobacco. She reports that she does not drink alcohol or use illicit drugs.    Allergies  Allergen Reactions  . Metoclopramide Hcl     REACTION: anxious and out of her mind  . Morphine     REACTION: lethargy/malaise  . Penicillins     Rash, itching  . Prednisone     REACTION: rash/hives  . Sulfonamide Derivatives     REACTION: itching    Medications:  Current Outpatient Prescriptions on File Prior to Visit  Medication Sig Dispense Refill  . ALPRAZolam (XANAX) 0.5 MG tablet TAKE  1/2 -1 TABLET BY MOUTH THREE TIMES DAILY AS NEEDED  90 tablet  5  . aspirin (ECOTRIN) 325 MG EC tablet Take 325 mg by mouth daily.        . ergocalciferol (VITAMIN D2) 50000 UNITS capsule Take 1 capsule (50,000 Units total) by mouth once a week.  4 capsule  11  . fluticasone (FLONASE) 50 MCG/ACT nasal spray Place 2 sprays into the nose daily.  16 g  2  . omeprazole (  PRILOSEC) 40 MG capsule TAKE ONE CAPSULE BY MOUTH DAILY  30 capsule  11  . OVER THE COUNTER MEDICATION Take 1 tablet by mouth 3 (three) times daily. Pt takes domperidone that she gets mail order from [place]      . rosuvastatin (CRESTOR) 5 MG tablet Take 5 mg by mouth daily. Only 3 days per week      . tolterodine (DETROL LA) 4 MG 24 hr capsule TAKE 1 CAPSULE BY MOUTH EVERY NIGHT AT BEDTIME  30 capsule  6  . zolpidem (AMBIEN) 10 MG tablet TAKE 1/2 TO 1 TABLET BY MOUTH EVERY NIGHT AT BEDTIME AS NEEDED SLEEP  30 tablet  5  . Dietary Management Product (AXONA) packet Take as directed  1 packet  5  . LOTEMAX 0.5 % ophthalmic suspension Place 1 drop into both eyes Daily.       . rifaximin (XIFAXAN) 200 MG tablet Take 200 mg by mouth 3 (three) times daily. 3 times a day as needed      . [DISCONTINUED] DETROL LA 4 MG 24 hr capsule TAKE ONE CAPSULE BY MOUTH EVERY NIGHT AT BEDTIME  30 each  6    No current facility-administered medications on file prior to visit.    ROS:  Out of a complete 14 system review of symptoms, the patient complains only of the following symptoms, and all other reviewed systems are negative.  Fatigue Trouble swallowing Cough Diarrhea, rectal pain, incontinence of bowels Daytime sleepiness Urgency Joint pain, walking difficulty Agitation, nervousness Left foot numbness  Blood pressure 131/69, pulse 78, weight 124 lb (56.246 kg).  Physical Exam  General: The patient is alert and cooperative at the time of the examination.  Neuromuscular: Range of movement of the lumbosacral spine is full.  Skin: No significant peripheral edema is noted.   Neurologic Exam  Mental status: The Mini-Mental status examination done today shows a total score of 29/30.  Cranial nerves: Facial symmetry is present. Speech is normal, no aphasia or dysarthria is noted. Extraocular movements are full. Visual fields are full.  Motor: The patient has good strength in all 4 extremities. The patient is able to walk on the heels and the toes bilaterally.  Sensory examination: Soft touch sensation on the face, arms, and legs is symmetric.  Coordination: The patient has good finger-nose-finger and heel-to-shin bilaterally.  Gait and station: The patient has a normal gait. Tandem gait is normal. Romberg is negative. No drift is seen.  Reflexes: Deep tendon reflexes are symmetric. Reflexes in the lower extremities are well-maintained.   MRI lumbosacral spine, April 2014:   IMPRESSION:  Abnormal MRI lumbar spine (without) demonstrating: 1. At L3-4: disc bulging and severe facet and ligamentum flavum hypertrophy with moderate spinal stenosis and moderate right and mild left foraminal stenosis 2. At L2-3: facet and ligamentum flavum hypertrophy with mild spinal stenosis and moderate right and mild left foraminal stenosis 3. At T12-L1: disc bulging and facet and  ligamentum flavum hypertrophy with no spinal stenosis and severe bilateral foraminal stenosis 4. At L5-S1: facet arthropathy with moderate to severe bilateral foraminal stenosis 5. Multilevel spondylosis and disc bulging from T11-12 down to L5-S1. Grade 1 anterior spondylolisthesis of L4 on L5 measuring 6 mm. Multi-level foraminal narrowing as above.   Assessment/Plan:  1. Mild memory disturbance  2. Lumbosacral spondylosis, left leg discomfort  The patient will continue Mobic for now, and she will be set up for physical therapy for neuromuscular therapy on the low back. If this is  not effective, an epidural steroid injection will be given. The patient will be restarted on Axona for her memory. The patient will followup in 6 months.  Marlan Palau MD 02/15/2013 7:01 PM  [facility] [address] [place], [place] [address]  Phone [phone] Fax [phone]

## 2013-02-18 ENCOUNTER — Other Ambulatory Visit: Payer: Self-pay

## 2013-02-18 MED ORDER — AXONA PO PACK: 40.0000 g | PACK | Freq: Every day | ORAL | Status: DC

## 2013-03-01 ENCOUNTER — Telehealth: Payer: Self-pay | Admitting: *Deleted

## 2013-03-02 NOTE — Telephone Encounter (Signed)
Patient would like a sample box of Axona.  I have asked Tye Maryland to place this at the front desk for pick up.

## 2013-03-07 ENCOUNTER — Ambulatory Visit: Payer: Medicare Other | Attending: Neurology | Admitting: Physical Therapy

## 2013-03-07 ENCOUNTER — Ambulatory Visit (INDEPENDENT_AMBULATORY_CARE_PROVIDER_SITE_OTHER): Payer: Medicare Other | Admitting: *Deleted

## 2013-03-07 DIAGNOSIS — E7800 Pure hypercholesterolemia, unspecified: Secondary | ICD-10-CM

## 2013-03-07 DIAGNOSIS — M25569 Pain in unspecified knee: Secondary | ICD-10-CM | POA: Insufficient documentation

## 2013-03-07 DIAGNOSIS — M545 Low back pain, unspecified: Secondary | ICD-10-CM | POA: Insufficient documentation

## 2013-03-07 DIAGNOSIS — IMO0001 Reserved for inherently not codable concepts without codable children: Secondary | ICD-10-CM | POA: Insufficient documentation

## 2013-03-07 LAB — HEPATIC FUNCTION PANEL
ALBUMIN: 3.9 g/dL (ref 3.5–5.2)
ALK PHOS: 71 U/L (ref 39–117)
ALT: 11 U/L (ref 0–35)
AST: 19 U/L (ref 0–37)
BILIRUBIN DIRECT: 0 mg/dL (ref 0.0–0.3)
TOTAL PROTEIN: 6.7 g/dL (ref 6.0–8.3)
Total Bilirubin: 0.8 mg/dL (ref 0.3–1.2)

## 2013-03-07 LAB — LIPID PANEL
CHOLESTEROL: 202 mg/dL — AB (ref 0–200)
HDL: 72.2 mg/dL (ref >39.00)
Total CHOL/HDL Ratio: 3
Triglycerides: 64 mg/dL (ref 0.0–149.0)
VLDL: 12.8 mg/dL (ref 0.0–40.0)

## 2013-03-07 LAB — LDL CHOLESTEROL, DIRECT: Direct LDL: 110.7 mg/dL

## 2013-03-08 ENCOUNTER — Ambulatory Visit (INDEPENDENT_AMBULATORY_CARE_PROVIDER_SITE_OTHER): Payer: Medicare Other | Admitting: Pharmacist

## 2013-03-08 VITALS — Ht 60.0 in | Wt 123.0 lb

## 2013-03-08 DIAGNOSIS — E7800 Pure hypercholesterolemia, unspecified: Secondary | ICD-10-CM

## 2013-03-08 NOTE — Progress Notes (Signed)
HPI  Paige Pena is a 73 yo f seen for follow up visit in Lipid clinic at the request of Dr. Lenna [facility].  Reviewed pt's PMH.  She has a history of cerebrovascular disease but no CAD or DM.  She has HTN listed as a problem, but she is currently not taking any medications and all of her recent BPs within the office have been normal.  She is not a smoker and does not drink alcohol.  Her family history is non-contributory.  She did state her maternal grandparents had "hardening of the arteries" but no events that she was aware of.   Pt is currently taking Crestor 5mg  three days of the week.  She admits that she has not been compliant with this regimen and went probably > 1 month without taking any.  She did restart about 1 week prior to her bloodwork.  Her biggest complaint today is pain in her left leg.  She has been seen by neurology and diagnosed with lumbosacral spondylosis.  She is scheduled to start physical therapy on Monday.   She also has vague aches and pains in her stomach along with a 1-2 lb weight loss.  She sees GI at [facility] and has been encouraged to follow up there.    Dietary review shows patient eats a very healthy diet.  She does state her appetite has been less recently.  Pt does not have a regular exercise routine at the moment due to leg pains.    Current Outpatient Prescriptions on File Prior to Visit  Medication Sig Dispense Refill  . ALPRAZolam (XANAX) 0.5 MG tablet TAKE  1/2 -1 TABLET BY MOUTH THREE TIMES DAILY AS NEEDED  90 tablet  5  . aspirin (ECOTRIN) 325 MG EC tablet Take 325 mg by mouth daily.        . Dietary Management Product (AXONA) packet Take 40 g by mouth daily.  90 packet  1  . ergocalciferol (VITAMIN D2) 50000 UNITS capsule Take 1 capsule (50,000 Units total) by mouth once a week.  4 capsule  11  . fluticasone (FLONASE) 50 MCG/ACT nasal spray Place 2 sprays into the nose daily.  16 g  2  . LOTEMAX 0.5 % ophthalmic suspension Place 1 drop into both eyes Daily.       .  meloxicam (MOBIC) 15 MG tablet Take 1 tablet (15 mg total) by mouth daily.  30 tablet  1  . omeprazole (PRILOSEC) 40 MG capsule TAKE ONE CAPSULE BY MOUTH DAILY  30 capsule  11  . OVER THE COUNTER MEDICATION Take 1 tablet by mouth 3 (three) times daily. Pt takes domperidone that she gets mail order from [place]      . prednisoLONE acetate (PRED FORTE) 1 % ophthalmic suspension Place 1 mL into both eyes daily.      . rifaximin (XIFAXAN) 200 MG tablet Take 200 mg by mouth 3 (three) times daily. 3 times a day as needed      . rosuvastatin (CRESTOR) 5 MG tablet Take 5 mg by mouth daily. Only 3 days per week      . tolterodine (DETROL LA) 4 MG 24 hr capsule TAKE 1 CAPSULE BY MOUTH EVERY NIGHT AT BEDTIME  30 capsule  6  . zolpidem (AMBIEN) 10 MG tablet TAKE 1/2 TO 1 TABLET BY MOUTH EVERY NIGHT AT BEDTIME AS NEEDED SLEEP  30 tablet  5  . [DISCONTINUED] DETROL LA 4 MG 24 hr capsule TAKE ONE CAPSULE BY MOUTH EVERY NIGHT  AT BEDTIME  30 each  6   No current facility-administered medications on file prior to visit.   Allergies  Allergen Reactions  . Metoclopramide Hcl     REACTION: anxious and out of her mind  . Morphine     REACTION: lethargy/malaise  . Penicillins     Rash, itching  . Prednisone     REACTION: rash/hives  . Sulfonamide Derivatives     REACTION: itching

## 2013-03-08 NOTE — Assessment & Plan Note (Signed)
Reviewed pt's labs. LDL increased from 93 to 110 but likely due to noncompliance with Crestor.  Discussed leg pain in relation to drug holiday with Crestor.  Pt cannot directly note any change in pain when she was or was not taking Crestor but was unaware this may be a side effect.  She would like to get off Crestor if possible.  Explained LDL is better but still not optimal.  Will have her decrease Crestor to 2 times a week to see if we can maintain LDL close to goal with decreasing amount of medication she is taking.  She will start physical therapy this week.  If she does not notice a change in pain after a few weeks of therapy, have given her the okay to try a drug holiday and see if she correlates any difference with or without Crestor.  Will follow up in 6 months unless issues occur in the meantime.

## 2013-03-08 NOTE — Patient Instructions (Signed)
Decrease Crestor to 5mg  twice a week.   After you have done physical therapy for 4-6 weeks, if you do not feel any difference in your leg pain, stop the Crestor for ~2 weeks to see if there is any difference.  If you notice your leg pain is better without the Crestor, please call Gay Filler at [phone].    Recheck labs in 6 months.

## 2013-03-11 ENCOUNTER — Telehealth: Payer: Self-pay

## 2013-03-11 DIAGNOSIS — E785 Hyperlipidemia, unspecified: Secondary | ICD-10-CM

## 2013-03-11 NOTE — Telephone Encounter (Signed)
Message copied by Lamar Laundry on Fri Mar 11, 2013  1:57 PM ------      Message from: Daneen Schick      Created: Thu Mar 10, 2013  7:56 AM       Cholesterol is mildly elevated but stable in comparison. No change in therapy needed. Repeat 1 year ------

## 2013-03-11 NOTE — Telephone Encounter (Signed)
pt aware of labs  Cholesterol is mildly elevated but stable in comparison. No change in therapy needed. Repeat 6 mo per Gay Filler, pharmd recommendations in lipid clinic.pt verbalized understanding.

## 2013-03-14 ENCOUNTER — Ambulatory Visit: Payer: Medicare Other | Admitting: Physical Therapy

## 2013-03-15 ENCOUNTER — Ambulatory Visit: Payer: Medicare Other | Admitting: Physical Therapy

## 2013-03-17 ENCOUNTER — Ambulatory Visit: Payer: Medicare Other | Admitting: Physical Therapy

## 2013-03-18 ENCOUNTER — Ambulatory Visit: Payer: Medicare Other | Admitting: Physical Therapy

## 2013-03-22 ENCOUNTER — Ambulatory Visit: Payer: Medicare Other | Admitting: Physical Therapy

## 2013-03-23 ENCOUNTER — Ambulatory Visit: Payer: Medicare Other | Admitting: Podiatry

## 2013-03-24 ENCOUNTER — Ambulatory Visit: Payer: Medicare Other | Admitting: Physical Therapy

## 2013-03-29 ENCOUNTER — Ambulatory Visit: Payer: Medicare Other | Attending: Neurology | Admitting: Physical Therapy

## 2013-03-29 DIAGNOSIS — M25569 Pain in unspecified knee: Secondary | ICD-10-CM | POA: Insufficient documentation

## 2013-03-29 DIAGNOSIS — IMO0001 Reserved for inherently not codable concepts without codable children: Secondary | ICD-10-CM | POA: Insufficient documentation

## 2013-03-29 DIAGNOSIS — M545 Low back pain, unspecified: Secondary | ICD-10-CM | POA: Insufficient documentation

## 2013-03-31 ENCOUNTER — Ambulatory Visit: Payer: Medicare Other | Admitting: Podiatry

## 2013-03-31 ENCOUNTER — Ambulatory Visit: Payer: Medicare Other | Admitting: Physical Therapy

## 2013-04-03 ENCOUNTER — Other Ambulatory Visit: Payer: Self-pay | Admitting: Pulmonary Disease

## 2013-04-04 ENCOUNTER — Telehealth: Payer: Self-pay | Admitting: Pulmonary Disease

## 2013-04-04 MED ORDER — ZOLPIDEM TARTRATE 10 MG PO TABS: ORAL_TABLET | ORAL | Status: DC

## 2013-04-04 NOTE — Telephone Encounter (Signed)
Rx has been sent in. Pt is aware. 

## 2013-04-05 ENCOUNTER — Ambulatory Visit: Payer: Medicare Other | Admitting: Physical Therapy

## 2013-04-08 ENCOUNTER — Ambulatory Visit: Payer: Medicare Other | Admitting: Physical Therapy

## 2013-04-13 ENCOUNTER — Ambulatory Visit: Payer: Medicare Other | Admitting: Physical Therapy

## 2013-04-19 ENCOUNTER — Ambulatory Visit: Payer: Medicare Other | Admitting: Physical Therapy

## 2013-04-21 ENCOUNTER — Ambulatory Visit: Payer: Medicare Other | Admitting: Physical Therapy

## 2013-04-26 ENCOUNTER — Ambulatory Visit: Payer: Medicare Other | Attending: Neurology | Admitting: Physical Therapy

## 2013-04-26 DIAGNOSIS — M545 Low back pain, unspecified: Secondary | ICD-10-CM | POA: Insufficient documentation

## 2013-04-26 DIAGNOSIS — M25569 Pain in unspecified knee: Secondary | ICD-10-CM | POA: Insufficient documentation

## 2013-04-26 DIAGNOSIS — IMO0001 Reserved for inherently not codable concepts without codable children: Secondary | ICD-10-CM | POA: Insufficient documentation

## 2013-05-02 ENCOUNTER — Ambulatory Visit: Payer: Medicare Other | Admitting: Physical Therapy

## 2013-05-05 ENCOUNTER — Ambulatory Visit: Payer: Medicare Other | Admitting: Physical Therapy

## 2013-05-11 ENCOUNTER — Telehealth: Payer: Self-pay | Admitting: Pulmonary Disease

## 2013-05-11 NOTE — Telephone Encounter (Signed)
Called and spoke with pt. She reports she has been feeling more fatigued lately and had a slight loss in weight. She is wanting to come in for blood work to see what is going on.   I also made pt aware SN is retiring from [facility] as of April 1. She reports she is going to have to think about this and let us know.   Please advise SN thanks

## 2013-05-12 NOTE — Telephone Encounter (Signed)
Spoke with pt. She is aware that per SN we should set her up with a new MD for primary care. I spent 15 minutes on the phone with her. She was very aggressive and combative. Kept stating, "I don't understand the big deal, I just want blood work done. I don't feel good!" I offered her an appointment with TP tomorrow but she never agreed to the appointment. I also offered to call [facility] (states this location is the closest to her) to get her set up with a new MD and she declined this as well.  Pt is going to speak with her daughter and get back with [place].

## 2013-05-12 NOTE — Telephone Encounter (Signed)
Per SN---  Now would be a  Good time  to establish with a new primary care doctor for further eval of these symptoms.  thanks

## 2013-05-13 ENCOUNTER — Other Ambulatory Visit: Payer: Self-pay | Admitting: Pulmonary Disease

## 2013-05-17 ENCOUNTER — Ambulatory Visit: Payer: Medicare Other | Admitting: Physical Therapy

## 2013-05-18 NOTE — Telephone Encounter (Signed)
I spoke with the pt and she is upset that she was not notified sooner of his retirement. I apologized for this. I offered to set her an appt at [facility]. Pt again refused and wants to look into the doctors there. I provided her with the docs names and advised to let us know ASAP so we can set her an appt. Pt states she will call back this afternoon. Will await call back.

## 2013-05-19 ENCOUNTER — Ambulatory Visit: Payer: Medicare Other | Admitting: Physical Therapy

## 2013-05-20 NOTE — Telephone Encounter (Signed)
Called, spoke with pt to follow up with her on this.   Pt states she is still looking into this and will call us back on Monday.

## 2013-05-23 MED ORDER — ALPRAZOLAM 0.5 MG PO TABS: ORAL_TABLET | ORAL | Status: DC

## 2013-05-23 NOTE — Telephone Encounter (Signed)
Spoke with pt. Had several thing she needed addressed. First she needs her Xanax refilled. This has been called in for her. Second, she is wanting to be set up with Dr. [name] at [facility]. I have given her # to that office so she may set up her appointment. Advised the pt to call us back if she has any problems.

## 2013-05-23 NOTE — Telephone Encounter (Signed)
Pt returning call to nurse a/b new dr.Stanley A Dalton

## 2013-05-24 ENCOUNTER — Ambulatory Visit: Payer: Medicare Other | Admitting: Physical Therapy

## 2013-06-01 ENCOUNTER — Ambulatory Visit: Payer: Medicare Other | Attending: Neurology | Admitting: Physical Therapy

## 2013-06-01 DIAGNOSIS — M545 Low back pain, unspecified: Secondary | ICD-10-CM | POA: Insufficient documentation

## 2013-06-01 DIAGNOSIS — M25569 Pain in unspecified knee: Secondary | ICD-10-CM | POA: Insufficient documentation

## 2013-06-01 DIAGNOSIS — IMO0001 Reserved for inherently not codable concepts without codable children: Secondary | ICD-10-CM | POA: Insufficient documentation

## 2013-06-08 ENCOUNTER — Ambulatory Visit: Payer: Medicare Other | Admitting: Physical Therapy

## 2013-06-13 ENCOUNTER — Ambulatory Visit: Payer: Medicare Other | Admitting: Physical Therapy

## 2013-06-15 ENCOUNTER — Encounter: Payer: Self-pay | Admitting: Pulmonary Disease

## 2013-06-15 ENCOUNTER — Ambulatory Visit: Payer: Medicare Other | Admitting: Physical Therapy

## 2013-06-17 ENCOUNTER — Ambulatory Visit: Payer: Medicare Other | Admitting: Physical Therapy

## 2013-06-28 ENCOUNTER — Ambulatory Visit: Payer: Medicare Other | Attending: Neurology | Admitting: Physical Therapy

## 2013-06-28 DIAGNOSIS — M25569 Pain in unspecified knee: Secondary | ICD-10-CM | POA: Insufficient documentation

## 2013-06-28 DIAGNOSIS — M545 Low back pain, unspecified: Secondary | ICD-10-CM | POA: Insufficient documentation

## 2013-06-28 DIAGNOSIS — IMO0001 Reserved for inherently not codable concepts without codable children: Secondary | ICD-10-CM | POA: Insufficient documentation

## 2013-06-30 ENCOUNTER — Ambulatory Visit: Payer: Medicare Other | Admitting: Physical Therapy

## 2013-07-05 ENCOUNTER — Ambulatory Visit: Payer: Medicare Other | Admitting: Physical Therapy

## 2013-07-07 ENCOUNTER — Ambulatory Visit: Payer: Medicare Other | Admitting: Physical Therapy

## 2013-07-07 ENCOUNTER — Telehealth: Payer: Self-pay

## 2013-07-07 NOTE — Telephone Encounter (Addendum)
Left message for call back Non-identifiable   New patient  CCS- 07/08/11--diverticula--[facility]- 05/10/13- incomplete BD- 03/17/12- low normal Td PNA Z- 05/09/11

## 2013-07-08 ENCOUNTER — Ambulatory Visit: Payer: Medicare Other | Admitting: Family Medicine

## 2013-07-08 NOTE — Telephone Encounter (Signed)
Medication and allergies:  Reviewed and updated  90 day supply/mail order: n/a Local pharmacy:  [id number] - [place], [place] - [address] AT [place]   Immunizations due:  Td- DUE; PNA- DUE   A/P: No changes to personal, family history or past surgical hx CCS- 07/08/11--diverticula--[facility]  MMG- 05/10/13- incomplete  BD- 03/17/12- low normal  Td - DUE PNA- DUE Z- 05/09/11   To Discuss with Provider: Nothing at this time.

## 2013-07-12 ENCOUNTER — Telehealth: Payer: Self-pay | Admitting: Family Medicine

## 2013-07-12 ENCOUNTER — Ambulatory Visit: Payer: Medicare Other | Admitting: Physical Therapy

## 2013-07-12 NOTE — Telephone Encounter (Signed)
Pt really wanted to be see soon, so I explained that she can call be back at 8am in the morning I can do my best to get her into one of the same day slots.  Pt understood and will call back if she wants the apt.

## 2013-07-12 NOTE — Telephone Encounter (Signed)
Caller name: Latrell  Call back number: :[phone]  Reason for call:   Pt will be a new pt in August, but states that she feels like she has fluid in her ear and wants to know of a good ENT that Dr. [name] would recommend for pt.  Please contact pt to advise of recommendation.  You may leave a VM if unavailable.

## 2013-07-12 NOTE — Telephone Encounter (Signed)
Pt will need a 15 minute appt to check her ears, cannot just refer a pt without ever being seen.

## 2013-07-13 ENCOUNTER — Ambulatory Visit (INDEPENDENT_AMBULATORY_CARE_PROVIDER_SITE_OTHER): Payer: Medicare Other | Admitting: Family Medicine

## 2013-07-13 ENCOUNTER — Encounter: Payer: Self-pay | Admitting: Family Medicine

## 2013-07-13 VITALS — BP 132/76 | HR 79 | Temp 98.4°F | Resp 16 | Wt 122.2 lb

## 2013-07-13 DIAGNOSIS — H6120 Impacted cerumen, unspecified ear: Secondary | ICD-10-CM

## 2013-07-13 DIAGNOSIS — J0100 Acute maxillary sinusitis, unspecified: Secondary | ICD-10-CM | POA: Insufficient documentation

## 2013-07-13 MED ORDER — DOXYCYCLINE HYCLATE 100 MG PO TABS: 100.0000 mg | ORAL_TABLET | Freq: Two times a day (BID) | ORAL | Status: DC

## 2013-07-13 NOTE — Patient Instructions (Signed)
Follow up as scheduled Start the Doxycycline twice daily- take w/ food Continue your Zyrtec and Flonase daily for the allergy component Call with any questions or concerns [place]! Hang in there!!!

## 2013-07-13 NOTE — Progress Notes (Signed)
Pre visit review using our clinic review tool, if applicable. No additional management support is needed unless otherwise documented below in the visit note. 

## 2013-07-13 NOTE — Assessment & Plan Note (Signed)
New.  Pt's ears were successfully irrigated and sxs improved.

## 2013-07-13 NOTE — Progress Notes (Signed)
   Subjective:    Patient ID: Paige Pena, female    DOB: [birth year], 73 y.o.   MRN: [id number]  HPI URI- pt reports ear pressire, L>R.  Will intermittently 'open up'.  No fever.  + PND.  Denies nasal congestion.  Using nasal steroid spray.  No facial pain/pressure.  + cough, will rattle.  + hoarseness.  On Zyrtec daily.   Review of Systems For ROS see HPI     Objective:   Physical Exam  Vitals reviewed. Constitutional: She appears well-developed and well-nourished. No distress.  HENT:  Head: Normocephalic and atraumatic.  Right Ear: Tympanic membrane normal.  Left Ear: Tympanic membrane normal.  Nose: Mucosal edema and rhinorrhea present. Right sinus exhibits maxillary sinus tenderness. Right sinus exhibits no frontal sinus tenderness. Left sinus exhibits maxillary sinus tenderness. Left sinus exhibits no frontal sinus tenderness.  Mouth/Throat: Uvula is midline and mucous membranes are normal. Posterior oropharyngeal erythema present. No oropharyngeal exudate.  Ear canals both full of cerumen- unable to curette successfully.  Cerumen was irrigated and successfully removed using forceps.  pts sxs improved immediately  Eyes: Conjunctivae and EOM are normal. Pupils are equal, round, and reactive to light.  Neck: Normal range of motion. Neck supple.  Cardiovascular: Normal rate, regular rhythm and normal heart sounds.   Pulmonary/Chest: Effort normal and breath sounds normal. No respiratory distress. She has no wheezes.  Lymphadenopathy:    She has no cervical adenopathy.          Assessment & Plan:

## 2013-07-13 NOTE — Assessment & Plan Note (Signed)
New.  Pt's sxs and PE consistent w/ infxn.  Start abx.  Continue allergy medication.  Reviewed supportive care and red flags that should prompt return.  Pt expressed understanding and is in agreement w/ plan.

## 2013-07-15 ENCOUNTER — Ambulatory Visit: Payer: Medicare Other | Admitting: Physical Therapy

## 2013-07-25 ENCOUNTER — Encounter: Payer: Self-pay | Admitting: Family Medicine

## 2013-07-25 ENCOUNTER — Encounter: Payer: Self-pay | Admitting: General Practice

## 2013-07-25 ENCOUNTER — Ambulatory Visit: Payer: Medicare Other | Admitting: Family Medicine

## 2013-07-25 ENCOUNTER — Telehealth: Payer: Self-pay

## 2013-07-25 ENCOUNTER — Ambulatory Visit (INDEPENDENT_AMBULATORY_CARE_PROVIDER_SITE_OTHER): Payer: 59 | Admitting: Family Medicine

## 2013-07-25 VITALS — BP 120/72 | HR 97 | Temp 98.3°F | Resp 16 | Wt 120.4 lb

## 2013-07-25 DIAGNOSIS — F329 Major depressive disorder, single episode, unspecified: Secondary | ICD-10-CM | POA: Insufficient documentation

## 2013-07-25 DIAGNOSIS — K219 Gastro-esophageal reflux disease without esophagitis: Secondary | ICD-10-CM

## 2013-07-25 DIAGNOSIS — F419 Anxiety disorder, unspecified: Secondary | ICD-10-CM

## 2013-07-25 DIAGNOSIS — R634 Abnormal weight loss: Secondary | ICD-10-CM | POA: Insufficient documentation

## 2013-07-25 DIAGNOSIS — IMO0001 Reserved for inherently not codable concepts without codable children: Secondary | ICD-10-CM

## 2013-07-25 DIAGNOSIS — F341 Dysthymic disorder: Secondary | ICD-10-CM

## 2013-07-25 DIAGNOSIS — K222 Esophageal obstruction: Secondary | ICD-10-CM

## 2013-07-25 DIAGNOSIS — F32A Depression, unspecified: Secondary | ICD-10-CM

## 2013-07-25 LAB — HEPATIC FUNCTION PANEL
ALBUMIN: 3.8 g/dL (ref 3.5–5.2)
ALT: 11 U/L (ref 0–35)
AST: 20 U/L (ref 0–37)
Alkaline Phosphatase: 72 U/L (ref 39–117)
Bilirubin, Direct: 0.1 mg/dL (ref 0.0–0.3)
TOTAL PROTEIN: 6.8 g/dL (ref 6.0–8.3)
Total Bilirubin: 0.7 mg/dL (ref 0.2–1.2)

## 2013-07-25 LAB — CBC WITH DIFFERENTIAL/PLATELET
BASOS PCT: 0.4 % (ref 0.0–3.0)
Basophils Absolute: 0 10*3/uL (ref 0.0–0.1)
EOS PCT: 0.9 % (ref 0.0–5.0)
Eosinophils Absolute: 0 10*3/uL (ref 0.0–0.7)
HCT: 40.1 % (ref 36.0–46.0)
HEMOGLOBIN: 13.4 g/dL (ref 12.0–15.0)
LYMPHS PCT: 27.8 % (ref 12.0–46.0)
Lymphs Abs: 1.3 10*3/uL (ref 0.7–4.0)
MCHC: 33.5 g/dL (ref 30.0–36.0)
MCV: 90.4 fl (ref 78.0–100.0)
MONOS PCT: 7.1 % (ref 3.0–12.0)
Monocytes Absolute: 0.3 10*3/uL (ref 0.1–1.0)
NEUTROS ABS: 3 10*3/uL (ref 1.4–7.7)
Neutrophils Relative %: 63.8 % (ref 43.0–77.0)
Platelets: 163 10*3/uL (ref 150.0–400.0)
RBC: 4.43 Mil/uL (ref 3.87–5.11)
RDW: 13.5 % (ref 11.5–15.5)
WBC: 4.7 10*3/uL (ref 4.0–10.5)

## 2013-07-25 LAB — BASIC METABOLIC PANEL
BUN: 13 mg/dL (ref 6–23)
CALCIUM: 9.3 mg/dL (ref 8.4–10.5)
CO2: 30 mEq/L (ref 19–32)
Chloride: 102 mEq/L (ref 96–112)
Creatinine, Ser: 1 mg/dL (ref 0.4–1.2)
GFR: 73.03 mL/min (ref >60.00)
GLUCOSE: 77 mg/dL (ref 70–99)
Potassium: 3.5 mEq/L (ref 3.5–5.1)
SODIUM: 138 meq/L (ref 135–145)

## 2013-07-25 LAB — TSH: TSH: 1.76 u[IU]/mL (ref 0.35–4.50)

## 2013-07-25 LAB — H. PYLORI ANTIBODY, IGG: H Pylori IgG: NEGATIVE

## 2013-07-25 MED ORDER — DULOXETINE HCL 20 MG PO CPEP: 20.0000 mg | ORAL_CAPSULE | Freq: Every day | ORAL | Status: DC

## 2013-07-25 NOTE — Assessment & Plan Note (Signed)
New.  Pt reports losing excessive weight but based on #s documented in chart, it has been ~3.5 lbs since December.  Will check labs.  Suspect that pt is not eating regularly due to gastroparesis and other abdominal issues, in combination w/ her depression.  Will follow closely.

## 2013-07-25 NOTE — Progress Notes (Signed)
   Subjective:    Patient ID: Paige Pena, female    DOB: October 14, 1940, 73 y.o.   MRN: [id number]  HPI Pt reports not feeling well 'for a couple of months'.  Reports unintentional weight loss (124 on 12/23 and 120.6 today).  Having sensation of fullness under diaphragm.  'feeling cramped or something in here'.  Hx of esophageal stricture, gastroparesis, GERD (on Omeprazole).  + fatigue.  Hx of fibromyalgia.  Not sleeping well despite using Ambien.  Pt admits to getting upset easily.  'i worry about everything'.  Daughter concerned that pt is depressed.  Pt admits that she will wake up early, feel overwhelmed by what needs to happen that day (and her quest for perfection- which her daughter feels is interfering w/ daily activities) and she will avoid doing what needs to be done, procrastinating later and later until she has either missed it entirely or is so rushed that she becomes twice as anxious about it.   Review of Systems For ROS see HPI     Objective:   Physical Exam  Vitals reviewed. Constitutional: She is oriented to person, place, and time. She appears well-developed and well-nourished. No distress.  HENT:  Head: Normocephalic and atraumatic.  Neck: Normal range of motion. Neck supple. No thyromegaly present.  Cardiovascular: Normal rate, regular rhythm, normal heart sounds and intact distal pulses.   Pulmonary/Chest: Effort normal and breath sounds normal. No respiratory distress. She has no wheezes. She has no rales.  Abdominal: Soft. Bowel sounds are normal. She exhibits no distension. There is no tenderness. There is no rebound and no guarding.  Musculoskeletal: She exhibits no edema.  Lymphadenopathy:    She has no cervical adenopathy.  Neurological: She is alert and oriented to person, place, and time. No cranial nerve deficit. Coordination normal.  Skin: Skin is warm and dry.  Psychiatric:  A little withdrawn, on the verge of tears several times          Assessment &  Plan:

## 2013-07-25 NOTE — Assessment & Plan Note (Signed)
Chronic problem, new to provider.  Will check for H pylori given pt's worsening epigastric and abdominal discomfort.

## 2013-07-25 NOTE — Assessment & Plan Note (Signed)
New.  Pt's family is concerned and her sxs are consistent w/ anxiety/depression.  Based on fact that she also carries the dx of fibromyalgia, will start low dose cymbalta and monitor for improvement.  Also provided names and #s of therapists in order for pt to start counseling.

## 2013-07-25 NOTE — Telephone Encounter (Signed)
Medication and allergies: Reviewed and updated   90 day supply/mail order: n/a  Local pharmacy: [id number] - [place], [place] - [address] AT [place]   Immunizations due: Td- DUE; PNA- DUE    A/P:  No changes to personal, family history or past surgical hx  CCS- 07/08/11--diverticula--[facility]  MMG- 05/10/13- incomplete  BD- 03/17/12- low normal  Td - DUE  PNA- DUE  Z- 05/09/11    To Discuss with Provider:  Patient would like to have labs drawn during this appointment.  Has not been feeling well---extremely tired, acid reflux, pain at diaphragm---knotting and twist ("like a muscle crawling"), a lot gas and belching, phelgm at the base of her throat that will not come up, lost weight since December--more than 2 lbs--"clothes are twisting on me," lightheadedness, feels like I can't take a deep breath, and problems swallowing.    Denies chest pain, diaphoresis, back pain, arm pain and difficulty breathing.    She has been feeling extremely tired for months--since Jan.  Pain in diaphragm--x 1 month.    Hx. Atypical chest pain, GERD, IBS, gastroparesis, esophageal stricture, and worsening anxiety (daughter is requesting counseling for mother)    Treating symptoms- omprezole, Gas-X, and domperidone---has helped some  Appointment today at 1:30 pm.

## 2013-07-25 NOTE — Progress Notes (Signed)
Pre visit review using our clinic review tool, if applicable. No additional management support is needed unless otherwise documented below in the visit note. 

## 2013-07-25 NOTE — Assessment & Plan Note (Signed)
New to provider, ongoing for pt.  Start low dose cymbalta for both pain and depressive sxs and will monitor for improvement.

## 2013-07-25 NOTE — Patient Instructions (Signed)
Follow up in 4-6 weeks to recheck mood, weight, and other symptoms We'll call you with your GI referral for the abdominal issues Start the Cymbalta daily for the mood and the fibromyalgia Call and schedule an appt for counseling We'll notify you of your lab results and make any changes if needed Make sure you are eating regularly Call with any questions or concerns Hang in there!

## 2013-07-25 NOTE — Assessment & Plan Note (Addendum)
New to provider, recurrent problem for pt.  Again having epigastric discomfort and sensation of food being hard to swallow/getting stuck.  Pt does not have local GI provider- will refer.

## 2013-08-15 ENCOUNTER — Other Ambulatory Visit (INDEPENDENT_AMBULATORY_CARE_PROVIDER_SITE_OTHER): Payer: Medicare Other

## 2013-08-15 DIAGNOSIS — E785 Hyperlipidemia, unspecified: Secondary | ICD-10-CM

## 2013-08-16 ENCOUNTER — Ambulatory Visit (INDEPENDENT_AMBULATORY_CARE_PROVIDER_SITE_OTHER): Payer: Medicare Other | Admitting: Pharmacist

## 2013-08-16 VITALS — Wt 122.0 lb

## 2013-08-16 DIAGNOSIS — E7800 Pure hypercholesterolemia, unspecified: Secondary | ICD-10-CM

## 2013-08-16 LAB — LIPID PANEL
CHOL/HDL RATIO: 2
Cholesterol: 188 mg/dL (ref 0–200)
HDL: 91.7 mg/dL (ref >39.00)
LDL CALC: 89 mg/dL (ref 0–99)
NonHDL: 96.3
TRIGLYCERIDES: 38 mg/dL (ref 0.0–149.0)
VLDL: 7.6 mg/dL (ref 0.0–40.0)

## 2013-08-16 LAB — ALT: ALT: 20 U/L (ref 0–35)

## 2013-08-16 NOTE — Patient Instructions (Signed)
Your labs look great today.  Try to take your  Crestor every Monday.   Recheck labs in 6 months.

## 2013-08-16 NOTE — Progress Notes (Signed)
HPI  Paige Pena is a 73 yo f seen for follow up visit in Lipid clinic.  Reviewed pt's PMH.  She has a history of cerebrovascular disease but no CAD or DM.  She has HTN listed as a problem, but she is currently not taking any medications and all of her recent BPs within the office have been normal.  She is not a smoker and does not drink alcohol.  Her family history is non-contributory.  She did state her maternal grandparents had "hardening of the arteries" but no events that she was aware of.   Pt is currently prescribed Crestor 5mg  two days of the week.  She admits that she has not been compliant with this regimen and only takes a few pills per month.  She did restart about 1 week prior to her bloodwork.  She states she has just not felt good for the past few months.  She recently established with Dr. [name] and was started on Cymbalta for fibromyalgia.  She states she is feeling much better now and it has helped decrease her anxiety as well.   Pt recently finished physical therapy for lumbosacral spondylosis.  She is scheduled to follow up with neurology later this week.  She states her pain is somewhat better but not completely resolved.   Current Outpatient Prescriptions on File Prior to Visit  Medication Sig Dispense Refill  . ALPRAZolam (XANAX) 0.5 MG tablet TAKE  1/2 -1 TABLET BY MOUTH THREE TIMES DAILY AS NEEDED  90 tablet  2  . aspirin (ECOTRIN) 325 MG EC tablet Take 325 mg by mouth daily.        . Biotin 5 MG CAPS Take 1 capsule by mouth daily.      . Dietary Management Product (AXONA) packet Take 40 g by mouth daily.  90 packet  1  . doxycycline (VIBRA-TABS) 100 MG tablet Take 1 tablet (100 mg total) by mouth 2 (two) times daily.  20 tablet  0  . DULoxetine (CYMBALTA) 20 MG capsule Take 1 capsule (20 mg total) by mouth daily.  30 capsule  3  . ergocalciferol (VITAMIN D2) 50000 UNITS capsule Take 1 capsule (50,000 Units total) by mouth once a week.  4 capsule  11  . fluticasone  (FLONASE) 50 MCG/ACT nasal spray Place 2 sprays into the nose daily.  16 g  2  . Iron-Vitamins (GERITOL) LIQD Take 15 mLs by mouth daily.      Marland Kitchen omeprazole (PRILOSEC) 40 MG capsule TAKE ONE CAPSULE BY MOUTH DAILY  30 capsule  11  . OVER THE COUNTER MEDICATION Take 1 tablet by mouth 3 (three) times daily. Pt takes domperidone that she gets mail order from [place]      . prednisoLONE acetate (PRED FORTE) 1 % ophthalmic suspension Place 1 mL into both eyes daily.      . rosuvastatin (CRESTOR) 5 MG tablet Take 5 mg by mouth daily. Only 3 days per week      . tolterodine (DETROL LA) 4 MG 24 hr capsule TAKE 1 CAPSULE BY MOUTH EVERY NIGHT AT BEDTIME  30 capsule  6  . TURMERIC PO Take 1 tablet by mouth daily. 1050 mg      . zolpidem (AMBIEN) 10 MG tablet TAKE 1/2 TO 1 TABLET BY MOUTH EVERY NIGHT AT BEDTIME AS NEEDED FOR SLEEP  30 tablet  3  . [DISCONTINUED] DETROL LA 4 MG 24 hr capsule TAKE ONE CAPSULE BY MOUTH EVERY NIGHT AT BEDTIME  30 each  6   No current facility-administered medications on file prior to visit.   Allergies  Allergen Reactions  . Metoclopramide Hcl     REACTION: anxious and out of her mind  . Morphine     REACTION: lethargy/malaise  . Penicillins     Rash, itching  . Prednisone     REACTION: rash/hives  . Sulfonamide Derivatives     REACTION: itching

## 2013-08-16 NOTE — Assessment & Plan Note (Signed)
Pt's labs improved despite noncompliance with Crestor.  Pt states she thinks she may be able to remember to take it once a week.  We will try this and recheck labs in 6 months.

## 2013-08-17 ENCOUNTER — Telehealth: Payer: Self-pay | Admitting: Neurology

## 2013-08-17 NOTE — Telephone Encounter (Signed)
I called the patient. She has not yet started the [place]. I indicated that this is a food supplement. It is okay to take the Cymbalta. The patient can be seen back in office in 3 or 4 months. I will have her see Megan.

## 2013-08-17 NOTE — Telephone Encounter (Signed)
I called the patient back.  She had several things she would like to address with Dr Jannifer Franklin, if possible.  She has cancelled her appt on 06/25 because she did not start taking the Axona prescribed.  Said "mentally she cannot handle the word Alzheimer's being on the box".  Says she knows it does not mean she has the disease, but is put off by that term.  Indicates last month her PCP started her on Cymbalta and she is now concerned she may not be able to take that along with Axona.  As well, she finished PT 3-4 weeks ago, and although it did help, she now has back and thigh pain again.  States in the past she thinks she took Meloxicam for this type of pain, which helped minimally, and would like to know if this or something else could be suggested to treat pain.  Please advise.  Thank you.

## 2013-08-17 NOTE — Telephone Encounter (Signed)
Patient called and stated she wanted to cancel appointment on 6/25.  She hadn't taken Dietary Management Product (AXONA) packet as instructed by Dr. Jannifer Franklin due to apprehensive after reading treated for Alzheimer"s.   She would like a return call to discuss when she should reschedule appointment.  She will start medication on 6/29.  Please call and advise.

## 2013-08-18 ENCOUNTER — Telehealth: Payer: Self-pay

## 2013-08-18 ENCOUNTER — Ambulatory Visit: Payer: Medicare Other | Admitting: Neurology

## 2013-08-18 NOTE — Telephone Encounter (Signed)
Message copied by Lamar Laundry on Thu Aug 18, 2013  1:09 PM ------      Message from: Daneen Schick      Created: Wed Aug 17, 2013  1:50 PM       Labs are very good. ------

## 2013-08-18 NOTE — Telephone Encounter (Signed)
called to give pt lab results.lmtcb

## 2013-08-23 ENCOUNTER — Ambulatory Visit (INDEPENDENT_AMBULATORY_CARE_PROVIDER_SITE_OTHER): Payer: 59 | Admitting: Licensed Clinical Social Worker

## 2013-08-23 DIAGNOSIS — F321 Major depressive disorder, single episode, moderate: Secondary | ICD-10-CM

## 2013-08-30 ENCOUNTER — Ambulatory Visit: Payer: Medicare Other | Attending: Gastroenterology | Admitting: Physical Therapy

## 2013-08-30 DIAGNOSIS — IMO0001 Reserved for inherently not codable concepts without codable children: Secondary | ICD-10-CM | POA: Insufficient documentation

## 2013-08-30 DIAGNOSIS — M2420 Disorder of ligament, unspecified site: Secondary | ICD-10-CM | POA: Insufficient documentation

## 2013-08-30 DIAGNOSIS — M629 Disorder of muscle, unspecified: Secondary | ICD-10-CM | POA: Insufficient documentation

## 2013-08-30 DIAGNOSIS — K5900 Constipation, unspecified: Secondary | ICD-10-CM | POA: Insufficient documentation

## 2013-08-30 DIAGNOSIS — K589 Irritable bowel syndrome without diarrhea: Secondary | ICD-10-CM | POA: Insufficient documentation

## 2013-09-01 ENCOUNTER — Ambulatory Visit: Payer: Medicare Other | Admitting: Physical Therapy

## 2013-09-01 DIAGNOSIS — M2420 Disorder of ligament, unspecified site: Secondary | ICD-10-CM | POA: Diagnosis not present

## 2013-09-01 DIAGNOSIS — M629 Disorder of muscle, unspecified: Secondary | ICD-10-CM | POA: Diagnosis not present

## 2013-09-01 DIAGNOSIS — K589 Irritable bowel syndrome without diarrhea: Secondary | ICD-10-CM | POA: Diagnosis not present

## 2013-09-01 DIAGNOSIS — IMO0001 Reserved for inherently not codable concepts without codable children: Secondary | ICD-10-CM | POA: Diagnosis present

## 2013-09-01 DIAGNOSIS — K5900 Constipation, unspecified: Secondary | ICD-10-CM | POA: Diagnosis not present

## 2013-09-13 ENCOUNTER — Ambulatory Visit (INDEPENDENT_AMBULATORY_CARE_PROVIDER_SITE_OTHER): Payer: 59 | Admitting: Licensed Clinical Social Worker

## 2013-09-13 DIAGNOSIS — F321 Major depressive disorder, single episode, moderate: Secondary | ICD-10-CM

## 2013-09-14 ENCOUNTER — Ambulatory Visit: Payer: Medicare Other | Admitting: Physical Therapy

## 2013-09-14 DIAGNOSIS — IMO0001 Reserved for inherently not codable concepts without codable children: Secondary | ICD-10-CM | POA: Diagnosis not present

## 2013-09-21 ENCOUNTER — Ambulatory Visit: Payer: Medicare Other | Admitting: Physical Therapy

## 2013-09-21 DIAGNOSIS — IMO0001 Reserved for inherently not codable concepts without codable children: Secondary | ICD-10-CM | POA: Diagnosis not present

## 2013-09-27 ENCOUNTER — Ambulatory Visit: Payer: Medicare Other | Admitting: Physical Therapy

## 2013-09-27 ENCOUNTER — Ambulatory Visit: Payer: 59 | Admitting: Licensed Clinical Social Worker

## 2013-09-28 ENCOUNTER — Ambulatory Visit: Payer: Medicare Other | Attending: Gastroenterology | Admitting: Physical Therapy

## 2013-09-28 DIAGNOSIS — M629 Disorder of muscle, unspecified: Secondary | ICD-10-CM | POA: Insufficient documentation

## 2013-09-28 DIAGNOSIS — K589 Irritable bowel syndrome without diarrhea: Secondary | ICD-10-CM | POA: Diagnosis not present

## 2013-09-28 DIAGNOSIS — M2420 Disorder of ligament, unspecified site: Secondary | ICD-10-CM | POA: Diagnosis not present

## 2013-09-28 DIAGNOSIS — IMO0001 Reserved for inherently not codable concepts without codable children: Secondary | ICD-10-CM | POA: Diagnosis present

## 2013-09-28 DIAGNOSIS — K5900 Constipation, unspecified: Secondary | ICD-10-CM | POA: Insufficient documentation

## 2013-10-05 ENCOUNTER — Ambulatory Visit: Payer: Medicare Other | Admitting: Physical Therapy

## 2013-10-09 ENCOUNTER — Other Ambulatory Visit: Payer: Self-pay | Admitting: Family Medicine

## 2013-10-09 ENCOUNTER — Other Ambulatory Visit: Payer: Self-pay | Admitting: Pulmonary Disease

## 2013-10-10 NOTE — Telephone Encounter (Signed)
Med filled.  

## 2013-10-12 ENCOUNTER — Telehealth: Payer: Self-pay

## 2013-10-12 ENCOUNTER — Ambulatory Visit: Payer: Medicare Other | Admitting: Physical Therapy

## 2013-10-12 MED ORDER — TOLTERODINE TARTRATE ER 4 MG PO CP24: ORAL_CAPSULE | ORAL | Status: DC

## 2013-10-12 MED ORDER — OMEPRAZOLE 40 MG PO CPDR: DELAYED_RELEASE_CAPSULE | ORAL | Status: DC

## 2013-10-12 NOTE — Telephone Encounter (Signed)
It appears the Ambien was just filled w/ refills.  Please call pharmacy to clarify.

## 2013-10-12 NOTE — Telephone Encounter (Signed)
Pt did not pick up insurance rejected.

## 2013-10-12 NOTE — Telephone Encounter (Signed)
Detrol and prilosec filled. Please advise on ambien.

## 2013-10-12 NOTE — Telephone Encounter (Signed)
Caller name:Arion Relation to pt: Call back number:[phone] H [phone] Cell [facility]: [facility]  Reason for call:Jennefer called she needs refills on 3 of her medszolpidem (AMBIEN) 10 MG tablet   / tolterodine (DETROL LA) 4 MG 24 hr capsule   / omeprazole (PRILOSEC) 40 MG capsule. On the zolpidem (AMBIEN) 10 MG tablet pharmacy said they did not have a refill for this, I see in the computer where there was one done on the Aug 17th with 3 refills, the pharmacy also told her that she could only get 90 pills in a calendar year and they suggested she try trazidine

## 2013-10-12 NOTE — Telephone Encounter (Signed)
Ok to switch to Trazodone 50mg - 1/2-1 tab nightly prn sleep, #30, 3 refills

## 2013-10-13 MED ORDER — TRAZODONE HCL 50 MG PO TABS
25.0000 mg | ORAL_TABLET | Freq: Every evening | ORAL | Status: DC | PRN
Start: 2013-10-13 — End: 2014-10-05

## 2013-10-13 NOTE — Telephone Encounter (Signed)
Med filled and pt notified.  

## 2013-10-18 ENCOUNTER — Ambulatory Visit (INDEPENDENT_AMBULATORY_CARE_PROVIDER_SITE_OTHER): Payer: 59 | Admitting: Licensed Clinical Social Worker

## 2013-10-18 DIAGNOSIS — F321 Major depressive disorder, single episode, moderate: Secondary | ICD-10-CM

## 2013-10-19 ENCOUNTER — Ambulatory Visit: Payer: Medicare Other | Admitting: Family Medicine

## 2013-10-19 ENCOUNTER — Ambulatory Visit: Payer: Medicare Other | Admitting: Physical Therapy

## 2013-10-21 NOTE — Telephone Encounter (Signed)
Noted  

## 2013-11-01 ENCOUNTER — Telehealth: Payer: Self-pay | Admitting: Family Medicine

## 2013-11-01 NOTE — Telephone Encounter (Signed)
Caller name: Hailynn Relation to pt: self Call back number: [phone] Pharmacy: [facility] [address]  Reason for call:    Patient is requesting a refill of dexilant   And, she would like to talk to someone regarding a trazadone rx.

## 2013-11-01 NOTE — Telephone Encounter (Signed)
Pt states the pharmacy informed her that she had another refill for her dexilant, pt no longer needs the refill.

## 2013-11-01 NOTE — Telephone Encounter (Signed)
Ok to continue

## 2013-11-01 NOTE — Telephone Encounter (Signed)
Trazadone---took medication for 3 days. Caused pt to dream crazy.  Pt does not want to take it anymore and therefore stopped medication.  She went to a vitamin store and picked up Somnapure PM (contains bromelain, lavender, hops, melatonin, etc). It's a natural supplement to help with sleep.  Pt denies any side effects.  Pt states that this is helping with sleep some and wants to know if it's okay for her to continue to take.    Please advise.

## 2013-11-01 NOTE — Telephone Encounter (Signed)
No longer needs the refill on dexilant, but does want to speak with someone regarding the Trazadone.   [phone] or [phone]

## 2013-11-02 ENCOUNTER — Ambulatory Visit: Payer: Medicare Other | Attending: Gastroenterology | Admitting: Physical Therapy

## 2013-11-02 DIAGNOSIS — K589 Irritable bowel syndrome without diarrhea: Secondary | ICD-10-CM | POA: Insufficient documentation

## 2013-11-02 DIAGNOSIS — IMO0001 Reserved for inherently not codable concepts without codable children: Secondary | ICD-10-CM | POA: Diagnosis present

## 2013-11-02 DIAGNOSIS — M629 Disorder of muscle, unspecified: Secondary | ICD-10-CM | POA: Diagnosis not present

## 2013-11-02 DIAGNOSIS — M2420 Disorder of ligament, unspecified site: Secondary | ICD-10-CM | POA: Diagnosis not present

## 2013-11-02 DIAGNOSIS — K5900 Constipation, unspecified: Secondary | ICD-10-CM | POA: Insufficient documentation

## 2013-11-02 NOTE — Telephone Encounter (Signed)
Left message for call back.

## 2013-11-03 ENCOUNTER — Ambulatory Visit: Payer: 59 | Admitting: Licensed Clinical Social Worker

## 2013-11-03 NOTE — Telephone Encounter (Signed)
Pt made aware that Somnapure PM is okay for her to continue taking.  No further needs voiced at this time.

## 2013-11-08 ENCOUNTER — Ambulatory Visit (INDEPENDENT_AMBULATORY_CARE_PROVIDER_SITE_OTHER): Payer: Medicare Other | Admitting: Adult Health

## 2013-11-08 ENCOUNTER — Encounter (INDEPENDENT_AMBULATORY_CARE_PROVIDER_SITE_OTHER): Payer: Self-pay

## 2013-11-08 ENCOUNTER — Other Ambulatory Visit: Payer: Self-pay | Admitting: *Deleted

## 2013-11-08 ENCOUNTER — Encounter: Payer: Self-pay | Admitting: Adult Health

## 2013-11-08 VITALS — BP 134/85 | HR 77 | Ht 60.5 in | Wt 128.0 lb

## 2013-11-08 DIAGNOSIS — R413 Other amnesia: Secondary | ICD-10-CM

## 2013-11-08 DIAGNOSIS — M5430 Sciatica, unspecified side: Secondary | ICD-10-CM

## 2013-11-08 DIAGNOSIS — M5442 Lumbago with sciatica, left side: Secondary | ICD-10-CM

## 2013-11-08 NOTE — Progress Notes (Addendum)
PATIENT: Paige Pena DOB: [birth year]  REASON FOR VISIT: follow up HISTORY FROM: patient  HISTORY OF PRESENT ILLNESS: Paige Pena is a 73 year old female with a history of a mild memory disorder and Lumbar spondylosis. She was referred to physical therapy for her back pain and discomfort in the left leg. She reports that was beneficial while doing it but now the pain has returned. She has pain that travels down the left leg to the knee. She states that right now she is in therapy for her " pelvic floor." She was also given axona for her memory loss, she reports that her memory is better. She states that she had a hard time starting the axona because it stated it  was for alzheimer's. She states that she just recently started it.   HISTORY 02/15/13 (CW): Paige Pena is a 73 year old right-handed black female with a history of a mild memory disturbance. The patient was given a prescription for Axona, but she never started it. The patient comes in with some worsening problems with her left leg, hip, and foot discomfort. The patient feels as if the leg and foot are heavy at times. The patient may have some numbness in the foot. The patient has no true weakness, but she feels as if the leg will not support her at times. The patient comes to this office for further evaluation. A prior lumbosacral spine MRI done in April 2014 showed multilevel spondylosis and neuroforaminal stenosis. The patient is on Mobic which does help some. The patient has not had epidural steroid injections. The patient returns for an evaluation  REVIEW OF SYSTEMS: Full 14 system review of systems performed and notable only for:  Constitutional: N/A  Eyes: N/A Ear/Nose/Throat: Ringing in ears, runny nose Skin: Moles, itching Cardiovascular: N/A  Respiratory: Wheezing Gastrointestinal: Rectal pain, incontinence of bowels Genitourinary: Incontinence of bladder, frequency of urination, urgency Hematology/Lymphatic: N/A    Endocrine: N/A Musculoskeletal: Joint pain, back pain Allergy/Immunology: N/A  Neurological: N/A Psychiatric: N/A Sleep: N/A   ALLERGIES: Allergies  Allergen Reactions  . Metoclopramide Hcl     REACTION: anxious and out of her mind  . Morphine     REACTION: lethargy/malaise  . Penicillins     Rash, itching  . Prednisone     REACTION: rash/hives  . Sulfonamide Derivatives     REACTION: itching    HOME MEDICATIONS: Outpatient Prescriptions Prior to Visit  Medication Sig Dispense Refill  . Vitamin D, Ergocalciferol, (DRISDOL) 50000 UNITS CAPS capsule       . ALPRAZolam (XANAX) 0.5 MG tablet TAKE  1/2 -1 TABLET BY MOUTH THREE TIMES DAILY AS NEEDED  90 tablet  2  . aspirin (ECOTRIN) 325 MG EC tablet Take 325 mg by mouth daily.        . Cetirizine HCl (ZYRTEC ALLERGY) 10 MG CAPS Frequency:PRN   Dosage:0.0     Instructions:  Note:Dose: UNKNOWN      . Dietary Management Product (AXONA) packet Take 40 g by mouth daily.  90 packet  1  . DULoxetine (CYMBALTA) 20 MG capsule Take 1 capsule (20 mg total) by mouth daily.  30 capsule  3  . ergocalciferol (VITAMIN D2) 50000 UNITS capsule Take 1 capsule (50,000 Units total) by mouth once a week.  4 capsule  11  . fluticasone (FLONASE) 50 MCG/ACT nasal spray Place 2 sprays into the nose daily.  16 g  2  . Iron-Vitamins (GERITOL) LIQD Take 15 mLs by mouth daily.      Marland Kitchen  meloxicam (MOBIC) 15 MG tablet       . omeprazole (PRILOSEC) 40 MG capsule TAKE ONE CAPSULE BY MOUTH DAILY  30 capsule  6  . OVER THE COUNTER MEDICATION Take 1 tablet by mouth 3 (three) times daily. Pt takes domperidone that she gets mail order from [place]      . prednisoLONE acetate (PRED FORTE) 1 % ophthalmic suspension Place 1 mL into both eyes daily.      . rifaximin (XIFAXAN) 200 MG tablet Take 400 mg by mouth.      . rosuvastatin (CRESTOR) 5 MG tablet Take 5 mg by mouth daily. Only 3 days per week      . tolterodine (DETROL LA) 4 MG 24 hr capsule TAKE 1 CAPSULE BY MOUTH  EVERY NIGHT AT BEDTIME  30 capsule  6  . traZODone (DESYREL) 50 MG tablet Take 0.5-1 tablets (25-50 mg total) by mouth at bedtime as needed for sleep.  30 tablet  3  . TURMERIC PO Take 1 tablet by mouth daily. 1050 mg      . Biotin 5 MG CAPS Take 1 capsule by mouth daily.      Marland Kitchen doxycycline (VIBRA-TABS) 100 MG tablet Take 1 tablet (100 mg total) by mouth 2 (two) times daily.  20 tablet  0  . fluticasone (FLONASE) 50 MCG/ACT nasal spray       . zolpidem (AMBIEN) 10 MG tablet TAKE 1/2-1 TABLET BY MOUTH EVERY DAY AT BEDTIME  30 tablet  3   No facility-administered medications prior to visit.    PAST MEDICAL HISTORY: Past Medical History  Diagnosis Date  . Dysphonia   . Asthmatic bronchitis   . Chest pain, atypical   . Cerebrovascular disease   . Hypercholesteremia   . GERD (gastroesophageal reflux disease)   . Acute cystitis   . Esophageal stricture   . Gastroparesis   . Diverticulosis of colon   . IBS (irritable bowel syndrome)   . Hemorrhoids   . Urinary incontinence   . Hematuria, microscopic   . DJD (degenerative joint disease)   . Fibromyalgia   . Anxiety   . Vitamin D deficiency   . RLS (restless legs syndrome)   . Aphasia     transsient aphasia Jan 2012  . Memory loss   . Memory deficits 02/15/2013    PAST SURGICAL HISTORY: Past Surgical History  Procedure Laterality Date  . Abdominal hysterectomy  1995  . Corneal transplant for keratonconus    . Right bunion surgery    . Left foot fracture with orif  07/2004    Dr. Sharol Given  . Eye surgery  2013    FAMILY HISTORY: Family History  Problem Relation Age of Onset  . Breast cancer      aunt  . Diabetes      aunt,brother,uncle  . Kidney disease      father  . Hyperlipidemia Sister   . Diabetes Brother   . Hypertension Mother   . Anemia Mother   . Kidney disease Father   . Dementia Maternal Grandmother     SOCIAL HISTORY: History   Social History  . Marital Status: Married    Spouse Name: Mallie Mussel     Number of Children: 3  . Years of Education: college-2   Occupational History  .     Social History Main Topics  . Smoking status: Never Smoker   . Smokeless tobacco: Never Used  . Alcohol Use: No  . Drug Use: No  .  Sexual Activity: Not on file   Other Topics Concern  . Not on file   Social History Narrative   Lennie Hummer   [place], daughter   Kimika Streater info release form to share her medical records with her children      PHYSICAL EXAM  Filed Vitals:   11/08/13 1417  Height: 5' 0.5" (1.537 m)  Weight: 128 lb (58.06 kg)   Body mass index is 24.58 kg/(m^2).  Generalized: Well developed, in no acute distress   Neurological examination  Mentation: Alert oriented to time, place, history taking. Follows all commands speech and language fluent Cranial nerve II-XII: Pupils were equal round reactive to light. Extraocular movements were full, visual field were full on confrontational test. Facial sensation and strength were normal. Uvula tongue midline. Head turning and shoulder shrug  were normal and symmetric. Motor: The motor testing reveals 5 over 5 strength of all 4 extremities. Good symmetric motor tone is noted throughout.  Sensory: Sensory testing is intact to soft touch on all 4 extremities. Decreased to pinprick on the left lower extremity when compared to the right. No evidence of extinction is noted.  Coordination: Cerebellar testing reveals good finger-nose-finger and heel-to-shin bilaterally.  Gait and station: Gait is normal. Tandem gait is normal. Romberg is negative. No drift is seen.  Reflexes: Deep tendon reflexes are symmetric and normal bilaterally.    DIAGNOSTIC DATA (LABS, IMAGING, TESTING) - I reviewed patient records, labs, notes, testing and imaging myself where available.  Lab Results  Component Value Date   WBC 4.7 07/25/2013   HGB 13.4 07/25/2013   HCT 40.1 07/25/2013   MCV 90.4 07/25/2013   PLT 163.0 07/25/2013       Component Value Date/Time   NA 138 07/25/2013 1417   K 3.5 07/25/2013 1417   CL 102 07/25/2013 1417   CO2 30 07/25/2013 1417   GLUCOSE 77 07/25/2013 1417   BUN 13 07/25/2013 1417   CREATININE 1.0 07/25/2013 1417   CALCIUM 9.3 07/25/2013 1417   PROT 6.8 07/25/2013 1417   ALBUMIN 3.8 07/25/2013 1417   AST 20 07/25/2013 1417   ALT 20 08/15/2013 1649   ALKPHOS 72 07/25/2013 1417   BILITOT 0.7 07/25/2013 1417   GFRNONAA 57* 01/29/2011 0238   GFRAA 66* 01/29/2011 0238   Lab Results  Component Value Date   CHOL 188 08/15/2013   HDL 91.70 08/15/2013   LDLCALC 89 08/15/2013   LDLDIRECT 110.7 03/07/2013   TRIG 38.0 08/15/2013   CHOLHDL 2 08/15/2013   Lab Results  Component Value Date   HGBA1C  Value: 5.7 (NOTE)                                                                       According to the ADA Clinical Practice Recommendations for 2011, when HbA1c is used as a screening test:   >=6.5%   Diagnostic of Diabetes Mellitus           (if abnormal result  is confirmed)  5.7-6.4%   Increased risk of developing Diabetes Mellitus  References:Diagnosis and Classification of Diabetes Mellitus,Diabetes LKTG,2563,89(HTDSK 1):S62-S69 and Standards of Medical Care in         Diabetes - 2011,Diabetes Care,2011,34  (Suppl 1):S11-S61.* 03/08/2010  Lab Results  Component Value Date   NTZGYFVC94 496 09/16/2010   Lab Results  Component Value Date   TSH 1.76 07/25/2013      ASSESSMENT AND PLAN 73 y.o. year old female  has a past medical history of Dysphonia; Asthmatic bronchitis; Chest pain, atypical; Cerebrovascular disease; Hypercholesteremia; GERD (gastroesophageal reflux disease); Acute cystitis; Esophageal stricture; Gastroparesis; Diverticulosis of colon; IBS (irritable bowel syndrome); Hemorrhoids; Urinary incontinence; Hematuria, microscopic; DJD (degenerative joint disease); Fibromyalgia; Anxiety; Vitamin D deficiency; RLS (restless legs syndrome); Aphasia; Memory loss; and Memory deficits (02/15/2013). here with:  1.  Memory loss 2. Low back pain with radiation down the left leg  Patient states that physical therapy was beneficial. However now that she has been physical therapy for a couple months the pain has returned. She states at this time she is not interested in doing the epidural steroid injections. She would like to continue trying the exercises at home and may get involved with the [facility]. I advised her to call our office if she decides to try the steroid injections. Patient's memory has remained stable MMSE 29/30. She just recently started using the [place]. She should continue using this. If her symptoms worsen or she develops new symptoms she should let us know. Otherwise she should followup in 6 months or sooner if needed  Ward Givens, MSN, NP-C 11/08/2013, 2:23 PM [facility] [address] [place], [place] [address] [phone]  Note: This document was prepared with digital dictation and possible smart phrase technology. Any transcriptional errors that result from this process are unintentional.

## 2013-11-08 NOTE — Patient Instructions (Signed)
Back Pain, Adult Low back pain is very common. About 1 in 5 people have back pain.The cause of low back pain is rarely dangerous. The pain often gets better over time.About half of people with a sudden onset of back pain feel better in just 2 weeks. About 8 in 10 people feel better by 6 weeks.  CAUSES Some common causes of back pain include:  Strain of the muscles or ligaments supporting the spine.  Wear and tear (degeneration) of the spinal discs.  Arthritis.  Direct injury to the back. DIAGNOSIS Most of the time, the direct cause of low back pain is not known.However, back pain can be treated effectively even when the exact cause of the pain is unknown.Answering your caregiver's questions about your overall health and symptoms is one of the most accurate ways to make sure the cause of your pain is not dangerous. If your caregiver needs more information, he or she may order lab work or imaging tests (X-rays or MRIs).However, even if imaging tests show changes in your back, this usually does not require surgery. HOME CARE INSTRUCTIONS For many people, back pain returns.Since low back pain is rarely dangerous, it is often a condition that people can learn to manageon their own.   Remain active. It is stressful on the back to sit or stand in one place. Do not sit, drive, or stand in one place for more than 30 minutes at a time. Take short walks on level surfaces as soon as pain allows.Try to increase the length of time you walk each day.  Do not stay in bed.Resting more than 1 or 2 days can delay your recovery.  Do not avoid exercise or work.Your body is made to move.It is not dangerous to be active, even though your back may hurt.Your back will likely heal faster if you return to being active before your pain is gone.  Pay attention to your body when you bend and lift. Many people have less discomfortwhen lifting if they bend their knees, keep the load close to their bodies,and  avoid twisting. Often, the most comfortable positions are those that put less stress on your recovering back.  Find a comfortable position to sleep. Use a firm mattress and lie on your side with your knees slightly bent. If you lie on your back, put a pillow under your knees.  Only take over-the-counter or prescription medicines as directed by your caregiver. Over-the-counter medicines to reduce pain and inflammation are often the most helpful.Your caregiver may prescribe muscle relaxant drugs.These medicines help dull your pain so you can more quickly return to your normal activities and healthy exercise.  Put ice on the injured area.  Put ice in a plastic bag.  Place a towel between your skin and the bag.  Leave the ice on for 15-20 minutes, 03-04 times a day for the first 2 to 3 days. After that, ice and heat may be alternated to reduce pain and spasms.  Ask your caregiver about trying back exercises and gentle massage. This may be of some benefit.  Avoid feeling anxious or stressed.Stress increases muscle tension and can worsen back pain.It is important to recognize when you are anxious or stressed and learn ways to manage it.Exercise is a great option. SEEK MEDICAL CARE IF:  You have pain that is not relieved with rest or medicine.  You have pain that does not improve in 1 week.  You have new symptoms.  You are generally not feeling well. SEEK   IMMEDIATE MEDICAL CARE IF:   You have pain that radiates from your back into your legs.  You develop new bowel or bladder control problems.  You have unusual weakness or numbness in your arms or legs.  You develop nausea or vomiting.  You develop abdominal pain.  You feel faint. Document Released: 02/10/2005 Document Revised: 08/12/2011 Document Reviewed: 06/14/2013 ExitCare Patient Information 2015 [employer]. This information is not intended to replace advice given to you by your health care provider. Make sure you  discuss any questions you have with your health care provider.  

## 2013-11-08 NOTE — Progress Notes (Signed)
I have read the note, and I agree with the clinical assessment and plan.  Paige Pena   

## 2013-11-16 ENCOUNTER — Ambulatory Visit: Payer: Medicare Other | Admitting: Physical Therapy

## 2013-11-17 ENCOUNTER — Ambulatory Visit (INDEPENDENT_AMBULATORY_CARE_PROVIDER_SITE_OTHER): Payer: 59 | Admitting: Licensed Clinical Social Worker

## 2013-11-17 DIAGNOSIS — F321 Major depressive disorder, single episode, moderate: Secondary | ICD-10-CM

## 2013-11-18 ENCOUNTER — Ambulatory Visit: Payer: Medicare Other | Admitting: Physical Therapy

## 2013-11-22 ENCOUNTER — Ambulatory Visit: Payer: Medicare Other | Admitting: Physical Therapy

## 2013-11-22 DIAGNOSIS — IMO0001 Reserved for inherently not codable concepts without codable children: Secondary | ICD-10-CM | POA: Diagnosis not present

## 2013-12-06 ENCOUNTER — Ambulatory Visit: Payer: 59 | Admitting: Licensed Clinical Social Worker

## 2013-12-07 ENCOUNTER — Ambulatory Visit: Payer: Medicare Other | Attending: Gastroenterology | Admitting: Physical Therapy

## 2013-12-07 DIAGNOSIS — K589 Irritable bowel syndrome without diarrhea: Secondary | ICD-10-CM | POA: Diagnosis not present

## 2013-12-07 DIAGNOSIS — K5902 Outlet dysfunction constipation: Secondary | ICD-10-CM | POA: Insufficient documentation

## 2013-12-07 DIAGNOSIS — M797 Fibromyalgia: Secondary | ICD-10-CM | POA: Diagnosis not present

## 2013-12-07 DIAGNOSIS — M6281 Muscle weakness (generalized): Secondary | ICD-10-CM | POA: Diagnosis not present

## 2013-12-08 ENCOUNTER — Other Ambulatory Visit: Payer: Self-pay | Admitting: Pulmonary Disease

## 2013-12-08 ENCOUNTER — Ambulatory Visit (INDEPENDENT_AMBULATORY_CARE_PROVIDER_SITE_OTHER): Payer: 59 | Admitting: Licensed Clinical Social Worker

## 2013-12-08 DIAGNOSIS — F322 Major depressive disorder, single episode, severe without psychotic features: Secondary | ICD-10-CM

## 2013-12-09 ENCOUNTER — Other Ambulatory Visit: Payer: Self-pay

## 2013-12-09 ENCOUNTER — Other Ambulatory Visit: Payer: Self-pay | Admitting: Family Medicine

## 2013-12-12 NOTE — Telephone Encounter (Signed)
Med filled.  

## 2013-12-22 ENCOUNTER — Ambulatory Visit: Payer: 59 | Admitting: Licensed Clinical Social Worker

## 2013-12-26 ENCOUNTER — Ambulatory Visit: Payer: 59 | Admitting: Licensed Clinical Social Worker

## 2013-12-26 ENCOUNTER — Ambulatory Visit: Payer: Medicare Other | Attending: Gastroenterology | Admitting: Physical Therapy

## 2013-12-26 DIAGNOSIS — K589 Irritable bowel syndrome without diarrhea: Secondary | ICD-10-CM | POA: Diagnosis not present

## 2013-12-26 DIAGNOSIS — K5902 Outlet dysfunction constipation: Secondary | ICD-10-CM | POA: Diagnosis present

## 2013-12-26 DIAGNOSIS — M797 Fibromyalgia: Secondary | ICD-10-CM | POA: Diagnosis not present

## 2013-12-26 DIAGNOSIS — M6281 Muscle weakness (generalized): Secondary | ICD-10-CM | POA: Diagnosis not present

## 2013-12-30 ENCOUNTER — Ambulatory Visit (INDEPENDENT_AMBULATORY_CARE_PROVIDER_SITE_OTHER): Payer: 59 | Admitting: Licensed Clinical Social Worker

## 2013-12-30 DIAGNOSIS — F322 Major depressive disorder, single episode, severe without psychotic features: Secondary | ICD-10-CM

## 2014-01-04 ENCOUNTER — Ambulatory Visit: Payer: Medicare Other | Admitting: Physical Therapy

## 2014-01-09 ENCOUNTER — Ambulatory Visit (INDEPENDENT_AMBULATORY_CARE_PROVIDER_SITE_OTHER): Payer: 59 | Admitting: Licensed Clinical Social Worker

## 2014-01-09 DIAGNOSIS — F322 Major depressive disorder, single episode, severe without psychotic features: Secondary | ICD-10-CM

## 2014-01-12 ENCOUNTER — Ambulatory Visit: Payer: Medicare Other | Admitting: Physical Therapy

## 2014-01-12 DIAGNOSIS — K5902 Outlet dysfunction constipation: Secondary | ICD-10-CM | POA: Diagnosis not present

## 2014-01-24 ENCOUNTER — Other Ambulatory Visit: Payer: Self-pay | Admitting: Family Medicine

## 2014-01-25 NOTE — Telephone Encounter (Signed)
last OV 07-25-13 cymbalta last filled 10-19 #30 with 0

## 2014-01-26 ENCOUNTER — Ambulatory Visit: Payer: Medicare Other | Admitting: Physical Therapy

## 2014-02-03 ENCOUNTER — Other Ambulatory Visit: Payer: Self-pay | Admitting: Pulmonary Disease

## 2014-02-07 ENCOUNTER — Other Ambulatory Visit: Payer: Self-pay | Admitting: General Practice

## 2014-02-07 MED ORDER — FLUTICASONE PROPIONATE 50 MCG/ACT NA SUSP: 2.0000 | Freq: Every day | NASAL | Status: DC

## 2014-02-14 ENCOUNTER — Ambulatory Visit: Payer: Medicare Other | Admitting: Physical Therapy

## 2014-02-21 ENCOUNTER — Ambulatory Visit: Payer: Medicare Other | Attending: Gastroenterology | Admitting: Physical Therapy

## 2014-02-21 DIAGNOSIS — M797 Fibromyalgia: Secondary | ICD-10-CM | POA: Insufficient documentation

## 2014-02-21 DIAGNOSIS — K5902 Outlet dysfunction constipation: Secondary | ICD-10-CM | POA: Diagnosis present

## 2014-02-21 DIAGNOSIS — K589 Irritable bowel syndrome without diarrhea: Secondary | ICD-10-CM | POA: Insufficient documentation

## 2014-02-21 DIAGNOSIS — M6281 Muscle weakness (generalized): Secondary | ICD-10-CM | POA: Diagnosis not present

## 2014-03-01 ENCOUNTER — Other Ambulatory Visit: Payer: Self-pay | Admitting: Pulmonary Disease

## 2014-03-06 ENCOUNTER — Other Ambulatory Visit (INDEPENDENT_AMBULATORY_CARE_PROVIDER_SITE_OTHER): Payer: Medicare Other | Admitting: *Deleted

## 2014-03-06 DIAGNOSIS — E7800 Pure hypercholesterolemia, unspecified: Secondary | ICD-10-CM

## 2014-03-06 LAB — HEPATIC FUNCTION PANEL
ALK PHOS: 82 U/L (ref 39–117)
ALT: 23 U/L (ref 0–35)
AST: 29 U/L (ref 0–37)
Albumin: 4 g/dL (ref 3.5–5.2)
BILIRUBIN TOTAL: 0.5 mg/dL (ref 0.2–1.2)
Bilirubin, Direct: 0 mg/dL (ref 0.0–0.3)
Total Protein: 7.2 g/dL (ref 6.0–8.3)

## 2014-03-06 LAB — LIPID PANEL
Cholesterol: 240 mg/dL — ABNORMAL HIGH (ref 0–200)
HDL: 76.1 mg/dL (ref >39.00)
LDL Cholesterol: 147 mg/dL — ABNORMAL HIGH (ref 0–99)
NonHDL: 163.9
Total CHOL/HDL Ratio: 3
Triglycerides: 86 mg/dL (ref 0.0–149.0)
VLDL: 17.2 mg/dL (ref 0.0–40.0)

## 2014-03-07 ENCOUNTER — Ambulatory Visit (INDEPENDENT_AMBULATORY_CARE_PROVIDER_SITE_OTHER): Payer: Medicare Other | Admitting: Pharmacist

## 2014-03-07 DIAGNOSIS — E7800 Pure hypercholesterolemia, unspecified: Secondary | ICD-10-CM

## 2014-03-07 MED ORDER — ROSUVASTATIN CALCIUM 5 MG PO TABS: 5.0000 mg | ORAL_TABLET | Freq: Every day | ORAL | Status: DC

## 2014-03-07 NOTE — Patient Instructions (Signed)
Begin taking your Crestor 5mg  twice weekly - Mondays and Fridays Decrease your amount of sweets, increase high-fiber veggies Increase your exercise to 20-24min of exercise 2-3 times each week - will increase over the coming months Try to work on keeping a consistent sleep/wake cycle to improve sleep quality and decrease fatigue Follow-up fasting lipid panel and liver function in 3 months Return to lipid clinic 1 week after checking labs

## 2014-03-07 NOTE — Progress Notes (Signed)
S/O: Paige Pena is a 74yo female seen for 59mo follow up visit in Lipid clinic.  Reviewed pt's PMH.  She has a history of cerebrovascular disease but no CAD or DM.  She has HTN listed as a problem, but she is currently not taking any medications and all of her recent BPs within the office have been normal.  She is not a smoker and does not drink alcohol.  Her family history is non-contributory.  She did state her maternal grandparents had "hardening of the arteries" but no events that she was aware of.   Pt is currently prescribed Crestor 5mg  once weekly (taking on Mondays).  There have been concerns related to compliance previously, but she reports today that she has been compliant with her Crestor and hasn't been skipping any weekly doses. She states she has been feeling fatigued the past few months as well as poor diet (lots of sweets) and no exercise. She has completed PT for lumbosacral spondylosis and has been followed by neurology. She previously reported her pain as somewhat better but not completely resolved; however, no complaints of pain in clinic today.  Diet: Primarily drinks sweet tea, juices diluted w/ water, V8, infrequent sodas and water    Breakfast: often skips d/t sleeping in late, boiled egg or multigrain toast sometimes    Lunch: more of a snack - sweets    Dinner: some fish, veggies, starch, doesn't eat much meat    Snacks: frequent sweets (pie, cake, ice cream, fig newtons)  Exercise: None at this time d/t excessive fatigue  Lipid Panel: 08/2012: TC 180, TG 42, HDL 78, LDL 93 (Crestor 5mg  three times weekly) 07/2013: TC 188, TG 38, HDL 91, LDL 89 (Crestor 5mg  twice weekly) 02/2014*: TC 240, TG 86, HDL 76, LDL 147 (Crestor 5mg  qMon) *Pt reports she was NOT FASTING when this was drawn  Goals: LDL <130, non-HDL < 160 (primary prevention, no significant risk factors)  Current Outpatient Prescriptions on File Prior to Visit  Medication Sig Dispense Refill  . ALPRAZolam  (XANAX) 0.5 MG tablet TAKE  1/2 -1 TABLET BY MOUTH THREE TIMES DAILY AS NEEDED 90 tablet 2  . aspirin (ECOTRIN) 325 MG EC tablet Take 325 mg by mouth daily.      . Cetirizine HCl (ZYRTEC ALLERGY) 10 MG CAPS Take 10 mg by mouth daily. Frequency:PRN   Dosage:0.0     Instructions:  Note:Dose: UNKNOWN    . Dietary Management Product (AXONA) packet Take 40 g by mouth daily. 90 packet 1  . DULoxetine (CYMBALTA) 20 MG capsule TAKE ONE CAPSULE BY MOUTH DAILY 30 capsule 6  . ergocalciferol (VITAMIN D2) 50000 UNITS capsule Take 1 capsule (50,000 Units total) by mouth once a week. 4 capsule 11  . fluticasone (FLONASE) 50 MCG/ACT nasal spray Place 2 sprays into both nostrils daily. 16 g 2  . Iron-Vitamins (GERITOL) LIQD Take 15 mLs by mouth daily.    . meloxicam (MOBIC) 15 MG tablet Take 15 mg by mouth as needed.     Marland Kitchen omeprazole (PRILOSEC) 40 MG capsule TAKE ONE CAPSULE BY MOUTH DAILY 30 capsule 6  . OVER THE COUNTER MEDICATION Take 1 tablet by mouth 3 (three) times daily. Pt takes domperidone that she gets mail order from [place]    . prednisoLONE acetate (PRED FORTE) 1 % ophthalmic suspension Place 1 mL into both eyes daily.    Marland Kitchen tolterodine (DETROL LA) 4 MG 24 hr capsule TAKE 1 CAPSULE BY MOUTH EVERY NIGHT AT BEDTIME  30 capsule 6  . traZODone (DESYREL) 50 MG tablet Take 0.5-1 tablets (25-50 mg total) by mouth at bedtime as needed for sleep. 30 tablet 3  . TURMERIC PO Take 1 tablet by mouth daily. 1050 mg    . [DISCONTINUED] DETROL LA 4 MG 24 hr capsule TAKE ONE CAPSULE BY MOUTH EVERY NIGHT AT BEDTIME 30 each 6   No current facility-administered medications on file prior to visit.   Allergies  Allergen Reactions  . Metoclopramide Hcl     REACTION: anxious and out of her mind  . Morphine     REACTION: lethargy/malaise  . Penicillins     Rash, itching  . Prednisone     REACTION: rash/hives  . Sulfonamide Derivatives     REACTION: itching   A/P: Pt lipid panel is significantly worse compared to  last draw 70mo ago (07/2013).  After further interviewing, pt claims she was not fasting during her lab draw for her most recent lipid panel (02/2014).  Pt needs slight improvement to get to LDL goal <130.  She appears to be dissatisfied with most recent results, but I explained to her that it may not be truly representative of where she would have been had she been fasting (and this was shortly after the holidays).  She expresses interest in increasing Crestor dose and states compliance will not be an issue.  She also verbalized interest in continue care with lipid clinic and sooner F/U for next visit d/t non-fasting labs this month and med changes I will make today.  -Increase Crestor 5mg  to twice weekly (Mon and Fri) -Decrease sweets, increase high-fiber veggies -Increase exercise to 20-73min 2-3x/week (goal 28min 5x/week) -Check 64mo FLP and LFTs -F/U in lipid clinic 1 week after checking labs  Drucie Opitz, PharmD Clinical Pharmacy Resident Pager: [phone]

## 2014-03-08 ENCOUNTER — Encounter: Payer: Self-pay | Admitting: Physical Therapy

## 2014-03-10 ENCOUNTER — Ambulatory Visit: Payer: Medicare Other | Attending: Gastroenterology | Admitting: Physical Therapy

## 2014-03-10 DIAGNOSIS — K5902 Outlet dysfunction constipation: Secondary | ICD-10-CM | POA: Diagnosis present

## 2014-03-10 DIAGNOSIS — M797 Fibromyalgia: Secondary | ICD-10-CM | POA: Insufficient documentation

## 2014-03-10 DIAGNOSIS — K589 Irritable bowel syndrome without diarrhea: Secondary | ICD-10-CM | POA: Diagnosis not present

## 2014-03-10 DIAGNOSIS — M6281 Muscle weakness (generalized): Secondary | ICD-10-CM | POA: Diagnosis not present

## 2014-03-20 ENCOUNTER — Ambulatory Visit: Payer: Medicare Other | Admitting: Physical Therapy

## 2014-03-24 ENCOUNTER — Ambulatory Visit: Payer: Medicare Other | Admitting: Physical Therapy

## 2014-03-29 ENCOUNTER — Telehealth: Payer: Self-pay | Admitting: Family Medicine

## 2014-03-29 NOTE — Telephone Encounter (Signed)
Pt notified that we cannot fill this medication without pt being seen. Pt was scheduled for 10:45am on 04/04/14.

## 2014-03-29 NOTE — Telephone Encounter (Signed)
Caller name: Carlos  Relation to pt:  self Call back number: [phone] Pharmacy: [facility] on [address] Reason for call:   Requesting vit d refill 50,000 IU once weekly

## 2014-03-30 ENCOUNTER — Ambulatory Visit: Payer: Medicare Other | Attending: Gastroenterology | Admitting: Physical Therapy

## 2014-03-30 DIAGNOSIS — M6281 Muscle weakness (generalized): Secondary | ICD-10-CM | POA: Insufficient documentation

## 2014-03-30 DIAGNOSIS — K589 Irritable bowel syndrome without diarrhea: Secondary | ICD-10-CM | POA: Diagnosis not present

## 2014-03-30 DIAGNOSIS — K5902 Outlet dysfunction constipation: Secondary | ICD-10-CM | POA: Diagnosis present

## 2014-03-30 DIAGNOSIS — M797 Fibromyalgia: Secondary | ICD-10-CM | POA: Insufficient documentation

## 2014-04-04 ENCOUNTER — Ambulatory Visit (INDEPENDENT_AMBULATORY_CARE_PROVIDER_SITE_OTHER): Payer: Medicare Other | Admitting: Family Medicine

## 2014-04-04 ENCOUNTER — Encounter: Payer: Self-pay | Admitting: Family Medicine

## 2014-04-04 VITALS — BP 120/80 | HR 91 | Temp 97.9°F | Resp 16 | Wt 125.5 lb

## 2014-04-04 DIAGNOSIS — R5383 Other fatigue: Secondary | ICD-10-CM | POA: Insufficient documentation

## 2014-04-04 DIAGNOSIS — F419 Anxiety disorder, unspecified: Secondary | ICD-10-CM

## 2014-04-04 DIAGNOSIS — Z23 Encounter for immunization: Secondary | ICD-10-CM

## 2014-04-04 DIAGNOSIS — F418 Other specified anxiety disorders: Secondary | ICD-10-CM

## 2014-04-04 DIAGNOSIS — E559 Vitamin D deficiency, unspecified: Secondary | ICD-10-CM

## 2014-04-04 DIAGNOSIS — F32A Depression, unspecified: Secondary | ICD-10-CM

## 2014-04-04 DIAGNOSIS — F329 Major depressive disorder, single episode, unspecified: Secondary | ICD-10-CM

## 2014-04-04 LAB — CBC WITH DIFFERENTIAL/PLATELET
BASOS PCT: 0.5 % (ref 0.0–3.0)
Basophils Absolute: 0 10*3/uL (ref 0.0–0.1)
Eosinophils Absolute: 0 10*3/uL (ref 0.0–0.7)
Eosinophils Relative: 1.3 % (ref 0.0–5.0)
HEMATOCRIT: 39.5 % (ref 36.0–46.0)
Hemoglobin: 13.4 g/dL (ref 12.0–15.0)
Lymphocytes Relative: 33.2 % (ref 12.0–46.0)
Lymphs Abs: 1.3 10*3/uL (ref 0.7–4.0)
MCHC: 34 g/dL (ref 30.0–36.0)
MCV: 88.7 fl (ref 78.0–100.0)
MONOS PCT: 9.1 % (ref 3.0–12.0)
Monocytes Absolute: 0.3 10*3/uL (ref 0.1–1.0)
Neutro Abs: 2.1 10*3/uL (ref 1.4–7.7)
Neutrophils Relative %: 55.9 % (ref 43.0–77.0)
PLATELETS: 143 10*3/uL — AB (ref 150.0–400.0)
RBC: 4.45 Mil/uL (ref 3.87–5.11)
RDW: 13.4 % (ref 11.5–15.5)
WBC: 3.8 10*3/uL — ABNORMAL LOW (ref 4.0–10.5)

## 2014-04-04 LAB — BASIC METABOLIC PANEL
BUN: 16 mg/dL (ref 6–23)
CALCIUM: 9.6 mg/dL (ref 8.4–10.5)
CO2: 31 mEq/L (ref 19–32)
CREATININE: 0.91 mg/dL (ref 0.40–1.20)
Chloride: 104 mEq/L (ref 96–112)
GFR: 77.53 mL/min (ref >60.00)
Glucose, Bld: 85 mg/dL (ref 70–99)
Potassium: 3.8 mEq/L (ref 3.5–5.1)
Sodium: 139 mEq/L (ref 135–145)

## 2014-04-04 LAB — TSH: TSH: 1.33 u[IU]/mL (ref 0.35–4.50)

## 2014-04-04 LAB — VITAMIN D 25 HYDROXY (VIT D DEFICIENCY, FRACTURES): VITD: 51.92 ng/mL (ref 30.00–100.00)

## 2014-04-04 NOTE — Progress Notes (Signed)
Pre visit review using our clinic review tool, if applicable. No additional management support is needed unless otherwise documented below in the visit note. 

## 2014-04-04 NOTE — Progress Notes (Signed)
   Subjective:    Patient ID: RAKEB KIBBLE, female    DOB: Apr 18, 1940, 74 y.o.   MRN: [id number]  HPI Vit D Deficiency- pt's last Vit D level that I could find was 2011.  Pt has been on 50,000 units weekly for at least 1 yr from previous MD.  Anxiety/Depression- chronic problem, pt was started on Cymbalta in June.  Pt reports mood has improved w/ addition of medication.  Fatigue- sxs started 'a couple of months' ago.  Pt reports sleeping better since starting the Trazodone but still fatigued.   No fevers.  Pt doesn't feel she is over-exerting herself but family disagrees.   Review of Systems For ROS see HPI     Objective:   Physical Exam  Constitutional: She is oriented to person, place, and time. She appears well-developed and well-nourished. No distress.  HENT:  Head: Normocephalic and atraumatic.  Eyes: Conjunctivae and EOM are normal. Pupils are equal, round, and reactive to light.  Neck: Normal range of motion. Neck supple. No thyromegaly present.  Cardiovascular: Normal rate, regular rhythm, normal heart sounds and intact distal pulses.   No murmur heard. Pulmonary/Chest: Effort normal and breath sounds normal. No respiratory distress.  Abdominal: Soft. She exhibits no distension. There is no tenderness.  Musculoskeletal: She exhibits no edema.  Lymphadenopathy:    She has no cervical adenopathy.  Neurological: She is alert and oriented to person, place, and time.  Skin: Skin is warm and dry.  Psychiatric: She has a normal mood and affect. Her behavior is normal.  Vitals reviewed.         Assessment & Plan:

## 2014-04-04 NOTE — Patient Instructions (Signed)
Schedule your complete physical in 6 months We'll notify you of your lab results and make any changes if needed Use the trazodone as needed for sleep Call with any questions or concerns Happy Valentine's Day!!!

## 2014-04-04 NOTE — Assessment & Plan Note (Signed)
New.  Pt's sxs are vague and nonspecific.  Family thinks her sxs are due to poor sleep (another component of fibromyalgia) and her tendency to overdo it.  Pt denies fever, night sweats, weight loss (is actually back to her baseline weight) and has no red flags on hx or PE.  Check labs to r/o metabolic cause.  Will follow.

## 2014-04-04 NOTE — Assessment & Plan Note (Signed)
Last Vit D level I could find in the chart was 2011.  Pt has been taking 50,000 units weekly for at least the last year.  Based on this, will get levels today to determine if this is necessary.

## 2014-04-04 NOTE — Assessment & Plan Note (Signed)
Chronic problem.  Pt was started on Cymbalta in June due to both mood sxs and hx of chronic fibromyalgia.  Pt reports mood has improved since starting meds.  She is very happy with the results.  Will continue at this time.

## 2014-04-05 ENCOUNTER — Encounter: Payer: Self-pay | Admitting: General Practice

## 2014-04-11 ENCOUNTER — Ambulatory Visit: Payer: Medicare Other | Admitting: Physical Therapy

## 2014-04-12 ENCOUNTER — Ambulatory Visit: Payer: 59 | Admitting: Licensed Clinical Social Worker

## 2014-04-20 ENCOUNTER — Encounter: Payer: Self-pay | Admitting: *Deleted

## 2014-04-20 NOTE — Telephone Encounter (Signed)
left a voice mail to let the patient know the appointment for 3/15 /2016  had to be rescheduled

## 2014-05-09 ENCOUNTER — Ambulatory Visit: Payer: Medicare Other | Admitting: Adult Health

## 2014-05-17 ENCOUNTER — Other Ambulatory Visit: Payer: Self-pay

## 2014-05-17 ENCOUNTER — Ambulatory Visit: Payer: Medicare Other | Admitting: Family

## 2014-05-17 ENCOUNTER — Encounter: Payer: Self-pay | Admitting: Medical

## 2014-05-17 ENCOUNTER — Ambulatory Visit (INDEPENDENT_AMBULATORY_CARE_PROVIDER_SITE_OTHER): Payer: Medicare Other | Admitting: Medical

## 2014-05-17 VITALS — BP 126/69 | HR 74 | Temp 98.2°F | Ht 60.5 in | Wt 123.6 lb

## 2014-05-17 DIAGNOSIS — R0789 Other chest pain: Secondary | ICD-10-CM

## 2014-05-17 DIAGNOSIS — J309 Allergic rhinitis, unspecified: Secondary | ICD-10-CM

## 2014-05-17 DIAGNOSIS — R1010 Upper abdominal pain, unspecified: Secondary | ICD-10-CM | POA: Diagnosis not present

## 2014-05-17 DIAGNOSIS — R109 Unspecified abdominal pain: Secondary | ICD-10-CM | POA: Insufficient documentation

## 2014-05-17 LAB — TROPONIN I: Troponin I: <0.01 ng/mL (ref <0.06)

## 2014-05-17 MED ORDER — RANITIDINE HCL 150 MG PO CAPS: 150.0000 mg | ORAL_CAPSULE | Freq: Two times a day (BID) | ORAL | Status: DC

## 2014-05-17 MED ORDER — LORATADINE 10 MG PO TABS: 10.0000 mg | ORAL_TABLET | Freq: Every day | ORAL | Status: DC

## 2014-05-17 MED ORDER — AZITHROMYCIN 250 MG PO TABS: ORAL_TABLET | ORAL | Status: DC

## 2014-05-17 NOTE — Assessment & Plan Note (Addendum)
Some pain sternal region 2 days ago and stopped in that region on that day. Started in abdomen about 1 wk ago  and radiated up to mid sternum briefly. No other signs or symptoms noted. Will get ekg and stat troponin. No chest pain for 2 days. But will see if trop elevated.

## 2014-05-17 NOTE — Progress Notes (Signed)
Pre visit review using our clinic review tool, if applicable. No additional management support is needed unless otherwise documented below in the visit note. 

## 2014-05-17 NOTE — Assessment & Plan Note (Signed)
Likely gerd. And She did belch some in office. Continue omeprazole. Add ranitidine 150 mg twice daily.

## 2014-05-17 NOTE — Progress Notes (Signed)
Subjective:    Patient ID: Paige Pena, female    DOB: 04-Oct-1940, 74 y.o.   MRN: [id number]  HPI    Pt in with some some epigastric area faint pain/pressure. Pt states this for q wk or longer. Sometimes it is moving up. Last time she felt move up sternum  2 days ago then the pain subsided. Some belching. Some hx of reflux. Pt is on omepazole. Otc med per her report to increase GI motility. She is not reporting other symptoms such as back pain, sob, jaw pain, or arm pain.  Pt states mild occasional cramping in abdomen. Hx of ibs. No diarrhea. No nausea and no vomiting.    Pt in has some nasal congestion.Mild post nasal drainage. Some sneezing for one week. Dry cough. Some ear pressure. Transient mild dizzy.  No facial swelling complaint even though LPN  put this as complaint,       Review of Systems  Constitutional: Negative for fever, chills and fatigue.  HENT: Positive for congestion, postnasal drip and sinus pressure. Negative for facial swelling, nosebleeds, sore throat, tinnitus and voice change.        Ear feel some blocked.  Respiratory: Negative for cough, choking and wheezing.        Transient mild chest pain 2 days ago. Radiated up from stomach. Resolved same day. Non present today.  Cardiovascular: Negative for chest pain and palpitations.  Gastrointestinal: Positive for abdominal pain and diarrhea. Negative for nausea, vomiting, constipation, blood in stool, abdominal distention, anal bleeding and rectal pain.       Epigastric region.  Genitourinary: Negative.   Musculoskeletal: Negative for back pain.  Skin: Negative for rash.  Neurological: Negative for dizziness, syncope, speech difficulty, weakness, light-headedness and numbness.       Transient occasional dizziness past week. None presently.  Hematological: Negative for adenopathy.  Psychiatric/Behavioral: Negative for confusion and sleep disturbance.   Past Medical History  Diagnosis Date  . Dysphonia     . Asthmatic bronchitis   . Chest pain, atypical   . Cerebrovascular disease   . Hypercholesteremia   . GERD (gastroesophageal reflux disease)   . Acute cystitis   . Esophageal stricture   . Gastroparesis   . Diverticulosis of colon   . IBS (irritable bowel syndrome)   . Hemorrhoids   . Urinary incontinence   . Hematuria, microscopic   . DJD (degenerative joint disease)   . Fibromyalgia   . Anxiety   . Vitamin D deficiency   . RLS (restless legs syndrome)   . Aphasia     transsient aphasia Jan 2012  . Memory loss   . Memory deficits 02/15/2013    History   Social History  . Marital Status: Married    Spouse Name: Mallie Mussel  . Number of Children: 3  . Years of Education: college-2   Occupational History  .     Social History Main Topics  . Smoking status: Never Smoker   . Smokeless tobacco: Never Used  . Alcohol Use: No  . Drug Use: No  . Sexual Activity: Not on file   Other Topics Concern  . Not on file   Social History Narrative   Lennie Hummer   [place], daughter   Aleah Ahlgrim info release form to share her medical records with her children    Past Surgical History  Procedure Laterality Date  . Abdominal hysterectomy  1995  . Corneal transplant for keratonconus    . Right  bunion surgery    . Left foot fracture with orif  07/2004    Dr. Sharol Given  . Eye surgery  2013    Family History  Problem Relation Age of Onset  . Breast cancer      aunt  . Diabetes      aunt,brother,uncle  . Kidney disease      father  . Hyperlipidemia Sister   . Diabetes Brother   . Hypertension Mother   . Anemia Mother   . Kidney disease Father   . Dementia Maternal Grandmother     Allergies  Allergen Reactions  . Metoclopramide Hcl     REACTION: anxious and out of her mind  . Morphine     REACTION: lethargy/malaise  . Penicillins     Rash, itching  . Prednisone     REACTION: rash/hives  . Sulfonamide Derivatives     REACTION:  itching    Current Outpatient Prescriptions on File Prior to Visit  Medication Sig Dispense Refill  . ALPRAZolam (XANAX) 0.5 MG tablet TAKE  1/2 -1 TABLET BY MOUTH THREE TIMES DAILY AS NEEDED 90 tablet 2  . aspirin (ECOTRIN) 325 MG EC tablet Take 325 mg by mouth daily.      . Cetirizine HCl (ZYRTEC ALLERGY) 10 MG CAPS Take 10 mg by mouth daily. Frequency:PRN   Dosage:0.0     Instructions:  Note:Dose: UNKNOWN    . DULoxetine (CYMBALTA) 20 MG capsule TAKE ONE CAPSULE BY MOUTH DAILY 30 capsule 6  . fluticasone (FLONASE) 50 MCG/ACT nasal spray Place 2 sprays into both nostrils daily. 16 g 2  . Iron-Vitamins (GERITOL) LIQD Take 15 mLs by mouth daily.    . meloxicam (MOBIC) 15 MG tablet Take 15 mg by mouth as needed.     Marland Kitchen omeprazole (PRILOSEC) 40 MG capsule TAKE ONE CAPSULE BY MOUTH DAILY 30 capsule 6  . OVER THE COUNTER MEDICATION Take 1 tablet by mouth 3 (three) times daily. Pt takes domperidone that she gets mail order from [place]    . prednisoLONE acetate (PRED FORTE) 1 % ophthalmic suspension Place 1 mL into both eyes daily.    . rosuvastatin (CRESTOR) 5 MG tablet Take 1 tablet (5 mg total) by mouth daily. On Mondays and Fridays 30 tablet 2  . tolterodine (DETROL LA) 4 MG 24 hr capsule TAKE 1 CAPSULE BY MOUTH EVERY NIGHT AT BEDTIME 30 capsule 6  . traZODone (DESYREL) 50 MG tablet Take 0.5-1 tablets (25-50 mg total) by mouth at bedtime as needed for sleep. 30 tablet 3  . TURMERIC PO Take 1 tablet by mouth daily. 1050 mg     No current facility-administered medications on file prior to visit.    BP 126/69 mmHg  Pulse 74  Temp(Src) 98.2 F (36.8 C) (Oral)  Ht 5' 0.5" (1.537 m)  Wt 123 lb 9.6 oz (56.065 kg)  BMI 23.73 kg/m2  SpO2 100%      Objective:   Physical Exam  General  Mental Status - Alert. General Appearance - Well groomed. Not in acute distress.  Skin Rashes- No Rashes.  HEENT Head- Normal. Ear Auditory Canal - Left- Normal. Right - Normal.Tympanic Membrane- Left-  Normal. Right- Normal. Eye Sclera/Conjunctiva- Left- Normal. Right- Normal. Nose & Sinuses Nasal Mucosa- Left-  Boggy and Congested. Right-  Boggy and  Congested.Faint bilateral maxillary and frontal sinus pressure. Mouth & Throat Lips: Upper Lip- Normal: no dryness, cracking, pallor, cyanosis, or vesicular eruption. Lower Lip-Normal: no dryness, cracking, pallor, cyanosis or vesicular  eruption. Buccal Mucosa- Bilateral- No Aphthous ulcers. Oropharynx- No Discharge or Erythema. Tonsils: Characteristics- Bilateral- No Erythema or Congestion. +pnd. Size/Enlargement- Bilateral- No enlargement. Discharge- bilateral-None.  Neck Neck- Supple. No Masses.   Chest and Lung Exam Auscultation: Breath Sounds:-Clear even and unlabored.  Cardiovascular Auscultation:Rythm- Regular, rate and rhythm. Murmurs & Other Heart Sounds:Ausculatation of the heart reveal- No Murmurs.  Lymphatic Head & Neck General Head & Neck Lymphatics: Bilateral: Description- No Localized lymphadenopathy.   Abdomen Inspection:-Inspection Normal.  Palpation/Perucssion: Palpation and Percussion of the abdomen reveal- Non Tender, No Rebound tenderness, No rigidity(Guarding) and No Palpable abdominal masses.  Liver:-Normal.  Spleen:- Normal.  No abdominal bruits.  Back- no cva tenderness.         Assessment & Plan:

## 2014-05-17 NOTE — Patient Instructions (Addendum)
Abdominal pain Likely gerd. And She did belch some in office. Continue omeprazole. Add ranitidine 150 mg twice daily.   Atypical chest pain Some pain 2 days ago. Started in abdomen and radiated up to mid sternum briefly. No other signs or symptoms noted. Will get ekg and stat troponin. No chest pain for 2 days. But will see if trop elevated.     Allergic rhinitis Claritin rx and use your nasal steroid. You have some sinus pressure now. If sinus pressure persists by weekend then start azithromycin.     If your abdomen pain worsens or you get recurrent/worse chest pain then be seen in the ED.  Follow up in 7 days any persisting symptom or as needed.

## 2014-05-17 NOTE — Assessment & Plan Note (Signed)
Claritin rx and use your nasal steroid. You have some sinus pressure now. If sinus pressure persists by weekend then start azithromycin.

## 2014-06-09 ENCOUNTER — Other Ambulatory Visit: Payer: Self-pay | Admitting: General Practice

## 2014-06-09 MED ORDER — OMEPRAZOLE 40 MG PO CPDR: DELAYED_RELEASE_CAPSULE | ORAL | Status: DC

## 2014-06-12 ENCOUNTER — Other Ambulatory Visit (INDEPENDENT_AMBULATORY_CARE_PROVIDER_SITE_OTHER): Payer: Medicare Other

## 2014-06-12 ENCOUNTER — Encounter: Payer: Self-pay | Admitting: General Practice

## 2014-06-12 DIAGNOSIS — E7800 Pure hypercholesterolemia, unspecified: Secondary | ICD-10-CM

## 2014-06-12 LAB — HEPATIC FUNCTION PANEL
ALT: 20 U/L (ref 0–35)
AST: 20 U/L (ref 0–37)
Albumin: 3.9 g/dL (ref 3.5–5.2)
Alkaline Phosphatase: 89 U/L (ref 39–117)
Bilirubin, Direct: 0.1 mg/dL (ref 0.0–0.3)
TOTAL PROTEIN: 6.7 g/dL (ref 6.0–8.3)
Total Bilirubin: 0.6 mg/dL (ref 0.2–1.2)

## 2014-06-12 LAB — LIPID PANEL
Cholesterol: 179 mg/dL (ref 0–200)
HDL: 65.8 mg/dL (ref >39.00)
LDL Cholesterol: 101 mg/dL — ABNORMAL HIGH (ref 0–99)
NonHDL: 113.2
Total CHOL/HDL Ratio: 3
Triglycerides: 60 mg/dL (ref 0.0–149.0)
VLDL: 12 mg/dL (ref 0.0–40.0)

## 2014-06-15 ENCOUNTER — Ambulatory Visit: Payer: Medicare Other | Admitting: Pharmacist

## 2014-06-20 ENCOUNTER — Encounter: Payer: Self-pay | Admitting: Pharmacist Clinician (PhC)/ Clinical Pharmacy Specialist

## 2014-06-20 ENCOUNTER — Ambulatory Visit (INDEPENDENT_AMBULATORY_CARE_PROVIDER_SITE_OTHER): Payer: Medicare Other | Admitting: Pharmacist Clinician (PhC)/ Clinical Pharmacy Specialist

## 2014-06-20 VITALS — Ht 61.0 in | Wt 130.8 lb

## 2014-06-20 DIAGNOSIS — E7800 Pure hypercholesterolemia, unspecified: Secondary | ICD-10-CM

## 2014-06-20 MED ORDER — ROSUVASTATIN CALCIUM 5 MG PO TABS: ORAL_TABLET | ORAL | Status: DC

## 2014-06-20 NOTE — Progress Notes (Signed)
S/O: [name] is a 74yo female seen for 3 mo follow up visit in Lipid clinic.  Reviewed pt's PMH.  She has a history of cerebrovascular disease but no CAD or DM.  She has HTN listed as a problem, but she is currently not taking any medications and all of her recent BPs  have been normal.  She is not a smoker and does not drink alcohol.  Her family history is non-contributory.  She did state her maternal grandparents had "hardening of the arteries" but no events that she was aware of.   Pt is currently prescribed Crestor 5mg  twice weekly.  No problems with muscle pains or compliance.  Diet: Primarily drinks sweet tea, juices diluted w/ water, V8, infrequent sodas and water    Breakfast: often skips d/t sleeping in late, boiled egg or multigrain toast sometimes    Lunch: more of a snack - sweets    Dinner: some fish, veggies, starch, doesn't eat much meat    Snacks: frequent sweets (pie, cake, ice cream, fig newtons)  Exercise: Just joined gym, signed up to work with Physiological scientist for 4 weeks, to work on balance, tone and core muscles.  Lipid Panel:  08/2012: TC 180, TG 42, HDL 78, LDL 93 (Crestor 5mg  three times weekly) 07/2013: TC 188, TG 38, HDL 91, LDL 89 (Crestor 5mg  twice weekly) 02/2014*: TC 240, TG 86, HDL 76, LDL 147 (Crestor 5mg  qMon) *Pt reports she was NOT FASTING when this was drawn 05/2014:  TC 179, TG 60, HDL 65.8, LDL 101 (Crestor 5 mg twice weekly)  Goals: LDL <130, non-HDL < 160 (primary prevention, no significant risk factors)  Current Outpatient Prescriptions on File Prior to Visit  Medication Sig Dispense Refill  . ALPRAZolam (XANAX) 0.5 MG tablet TAKE  1/2 -1 TABLET BY MOUTH THREE TIMES DAILY AS NEEDED 90 tablet 2  . aspirin (ECOTRIN) 325 MG EC tablet Take 325 mg by mouth daily.      . Cetirizine HCl (ZYRTEC ALLERGY) 10 MG CAPS Take 10 mg by mouth daily. Frequency:PRN   Dosage:0.0     Instructions:  Note:Dose: UNKNOWN    . DULoxetine (CYMBALTA) 20 MG capsule  TAKE ONE CAPSULE BY MOUTH DAILY 30 capsule 6  . fluticasone (FLONASE) 50 MCG/ACT nasal spray Place 2 sprays into both nostrils daily. 16 g 2  . Iron-Vitamins (GERITOL) LIQD Take 15 mLs by mouth daily.    Marland Kitchen loratadine (CLARITIN) 10 MG tablet Take 1 tablet (10 mg total) by mouth daily. 30 tablet 0  . meloxicam (MOBIC) 15 MG tablet Take 15 mg by mouth as needed.     Marland Kitchen omeprazole (PRILOSEC) 40 MG capsule TAKE ONE CAPSULE BY MOUTH DAILY 30 capsule 6  . OVER THE COUNTER MEDICATION Take 1 tablet by mouth 3 (three) times daily. Pt takes domperidone that she gets mail order from [place]    . prednisoLONE acetate (PRED FORTE) 1 % ophthalmic suspension Place 1 mL into both eyes daily.    . ranitidine (ZANTAC) 150 MG capsule Take 1 capsule (150 mg total) by mouth 2 (two) times daily. 60 capsule 0  . rosuvastatin (CRESTOR) 5 MG tablet Take 1 tablet (5 mg total) by mouth daily. On Mondays and Fridays 30 tablet 2  . tolterodine (DETROL LA) 4 MG 24 hr capsule TAKE 1 CAPSULE BY MOUTH EVERY NIGHT AT BEDTIME 30 capsule 6  . traZODone (DESYREL) 50 MG tablet Take 0.5-1 tablets (25-50 mg total) by mouth at bedtime as needed for  sleep. 30 tablet 3  . TURMERIC PO Take 1 tablet by mouth daily. 1050 mg     No current facility-administered medications on file prior to visit.   Allergies  Allergen Reactions  . Metoclopramide Hcl     REACTION: anxious and out of her mind  . Morphine     REACTION: lethargy/malaise  . Penicillins     Rash, itching  . Prednisone     REACTION: rash/hives  . Sulfonamide Derivatives     REACTION: itching     Tommy Medal PharmD CPP [facility]

## 2014-06-20 NOTE — Assessment & Plan Note (Signed)
Today her cholesterol labs are much improved.  She is having no problems with the Crestor.  Assured her that LDL is at goal at 101, but she would like to see if she can increase dose to three times weekly and get LDL down into the 80s.  I assured her that this would be fine.  Praised her joining gym.  Will repeat labs in 3 months then see her afterward.

## 2014-06-20 NOTE — Patient Instructions (Signed)
Increase your crestor to 3 times each week.  Congrats on joining the gym.  Good luck with that.  Eat a healthy high fiber, low sweets diet.  Repeat labs in 3 months.  See [place] afterward.

## 2014-06-22 ENCOUNTER — Ambulatory Visit: Payer: Medicare Other | Admitting: Pharmacist

## 2014-06-26 ENCOUNTER — Telehealth: Payer: Self-pay | Admitting: Pharmacist

## 2014-06-26 ENCOUNTER — Other Ambulatory Visit: Payer: Self-pay | Admitting: Medical

## 2014-06-26 NOTE — Telephone Encounter (Signed)
Spoke with pt.  Explained we do not have any samples.  The generic should be available at any time now so suggested she call her pharmacy every week or so to see when it will be available.

## 2014-06-26 NOTE — Telephone Encounter (Signed)
New Message       Pt calling about Crestor samples and I explained to pt that we no longer get samples due to the fact that it will be going generic this month. Pt would like a call back from [place] or [place] in regards to this. Please call back and advise.

## 2014-07-21 ENCOUNTER — Other Ambulatory Visit: Payer: Self-pay | Admitting: Family Medicine

## 2014-07-21 NOTE — Telephone Encounter (Signed)
Med filled.  

## 2014-09-14 ENCOUNTER — Other Ambulatory Visit: Payer: Self-pay

## 2014-09-18 ENCOUNTER — Other Ambulatory Visit (INDEPENDENT_AMBULATORY_CARE_PROVIDER_SITE_OTHER): Payer: Medicare Other | Admitting: *Deleted

## 2014-09-18 DIAGNOSIS — E780 Pure hypercholesterolemia: Secondary | ICD-10-CM | POA: Diagnosis not present

## 2014-09-18 NOTE — Addendum Note (Signed)
Addended by: Eulis Foster on: 09/18/2014 08:23 AM   Modules accepted: Orders

## 2014-09-19 ENCOUNTER — Other Ambulatory Visit (INDEPENDENT_AMBULATORY_CARE_PROVIDER_SITE_OTHER): Payer: Medicare Other

## 2014-09-19 ENCOUNTER — Ambulatory Visit: Payer: Self-pay | Admitting: Pharmacist

## 2014-09-19 DIAGNOSIS — E7800 Pure hypercholesterolemia, unspecified: Secondary | ICD-10-CM

## 2014-09-19 LAB — LIPID PANEL
Cholesterol: 229 mg/dL — ABNORMAL HIGH (ref 0–200)
HDL: 81.1 mg/dL (ref >39.00)
LDL Cholesterol: 133 mg/dL — ABNORMAL HIGH (ref 0–99)
NonHDL: 147.9
Total CHOL/HDL Ratio: 3
Triglycerides: 75 mg/dL (ref 0.0–149.0)
VLDL: 15 mg/dL (ref 0.0–40.0)

## 2014-09-20 ENCOUNTER — Ambulatory Visit: Payer: Self-pay | Admitting: Pharmacist

## 2014-09-20 LAB — HEPATIC FUNCTION PANEL
ALK PHOS: 87 U/L (ref 39–117)
ALT: 12 U/L (ref 0–35)
AST: 18 U/L (ref 0–37)
Albumin: 4.2 g/dL (ref 3.5–5.2)
BILIRUBIN TOTAL: 0.6 mg/dL (ref 0.2–1.2)
Bilirubin, Direct: 0.1 mg/dL (ref 0.0–0.3)
Total Protein: 7.5 g/dL (ref 6.0–8.3)

## 2014-09-21 ENCOUNTER — Ambulatory Visit: Payer: Self-pay | Admitting: Pharmacist

## 2014-09-21 ENCOUNTER — Telehealth: Payer: Self-pay | Admitting: Pharmacist

## 2014-09-21 DIAGNOSIS — E7800 Pure hypercholesterolemia, unspecified: Secondary | ICD-10-CM

## 2014-09-21 NOTE — Telephone Encounter (Signed)
Spoke with Ms. Ryland via phone re: lipid panel. LDL has increased from 101 to 133 in the past 3 months. Patient reports that she stopped taking her Crestor 5mg  three times a week due to cost, although she had been tolerating her Crestor just fine. She has been making better choices with her diet and exercising more and was hoping that she would not need the Crestor. Patient has no significant CV family history and has an LDL goal < 130 for primary prevention. She is very close to goal - patient is agreeable to picking up Crestor again. F/u labs in 3 months.

## 2014-10-04 ENCOUNTER — Telehealth: Payer: Self-pay

## 2014-10-04 NOTE — Telephone Encounter (Signed)
LMOVM

## 2014-10-05 ENCOUNTER — Ambulatory Visit (INDEPENDENT_AMBULATORY_CARE_PROVIDER_SITE_OTHER): Payer: Medicare Other | Admitting: Family Medicine

## 2014-10-05 ENCOUNTER — Encounter: Payer: Self-pay | Admitting: General Practice

## 2014-10-05 ENCOUNTER — Encounter: Payer: Self-pay | Admitting: Family Medicine

## 2014-10-05 VITALS — BP 124/72 | HR 87 | Temp 97.9°F | Resp 16 | Ht 61.0 in | Wt 129.4 lb

## 2014-10-05 DIAGNOSIS — E7800 Pure hypercholesterolemia, unspecified: Secondary | ICD-10-CM

## 2014-10-05 DIAGNOSIS — F32A Depression, unspecified: Secondary | ICD-10-CM

## 2014-10-05 DIAGNOSIS — E559 Vitamin D deficiency, unspecified: Secondary | ICD-10-CM

## 2014-10-05 DIAGNOSIS — Z23 Encounter for immunization: Secondary | ICD-10-CM

## 2014-10-05 DIAGNOSIS — R0789 Other chest pain: Secondary | ICD-10-CM

## 2014-10-05 DIAGNOSIS — I679 Cerebrovascular disease, unspecified: Secondary | ICD-10-CM

## 2014-10-05 DIAGNOSIS — H6123 Impacted cerumen, bilateral: Secondary | ICD-10-CM

## 2014-10-05 DIAGNOSIS — Z0000 Encounter for general adult medical examination without abnormal findings: Secondary | ICD-10-CM

## 2014-10-05 DIAGNOSIS — F329 Major depressive disorder, single episode, unspecified: Secondary | ICD-10-CM

## 2014-10-05 DIAGNOSIS — F419 Anxiety disorder, unspecified: Secondary | ICD-10-CM

## 2014-10-05 DIAGNOSIS — F418 Other specified anxiety disorders: Secondary | ICD-10-CM | POA: Diagnosis not present

## 2014-10-05 DIAGNOSIS — K3184 Gastroparesis: Secondary | ICD-10-CM

## 2014-10-05 LAB — CBC WITH DIFFERENTIAL/PLATELET
Basophils Absolute: 0 10*3/uL (ref 0.0–0.1)
Basophils Relative: 0.6 % (ref 0.0–3.0)
Eosinophils Absolute: 0.1 10*3/uL (ref 0.0–0.7)
Eosinophils Relative: 2.2 % (ref 0.0–5.0)
HEMATOCRIT: 40.8 % (ref 36.0–46.0)
HEMOGLOBIN: 13.5 g/dL (ref 12.0–15.0)
LYMPHS ABS: 1.4 10*3/uL (ref 0.7–4.0)
Lymphocytes Relative: 39 % (ref 12.0–46.0)
MCHC: 33.1 g/dL (ref 30.0–36.0)
MCV: 89.5 fl (ref 78.0–100.0)
MONO ABS: 0.3 10*3/uL (ref 0.1–1.0)
MONOS PCT: 8.4 % (ref 3.0–12.0)
NEUTROS ABS: 1.8 10*3/uL (ref 1.4–7.7)
Neutrophils Relative %: 49.8 % (ref 43.0–77.0)
PLATELETS: 169 10*3/uL (ref 150.0–400.0)
RBC: 4.56 Mil/uL (ref 3.87–5.11)
RDW: 12.9 % (ref 11.5–15.5)
WBC: 3.6 10*3/uL — ABNORMAL LOW (ref 4.0–10.5)

## 2014-10-05 LAB — BASIC METABOLIC PANEL
BUN: 12 mg/dL (ref 6–23)
CHLORIDE: 104 meq/L (ref 96–112)
CO2: 31 mEq/L (ref 19–32)
Calcium: 9.4 mg/dL (ref 8.4–10.5)
Creatinine, Ser: 0.96 mg/dL (ref 0.40–1.20)
GFR: 72.79 mL/min (ref >60.00)
Glucose, Bld: 84 mg/dL (ref 70–99)
Potassium: 4 mEq/L (ref 3.5–5.1)
Sodium: 141 mEq/L (ref 135–145)

## 2014-10-05 LAB — VITAMIN D 25 HYDROXY (VIT D DEFICIENCY, FRACTURES): VITD: 33.74 ng/mL (ref 30.00–100.00)

## 2014-10-05 LAB — TSH: TSH: 2.56 u[IU]/mL (ref 0.35–4.50)

## 2014-10-05 MED ORDER — OMEPRAZOLE 40 MG PO CPDR: DELAYED_RELEASE_CAPSULE | ORAL | Status: DC

## 2014-10-05 MED ORDER — FLUTICASONE PROPIONATE 50 MCG/ACT NA SUSP: 2.0000 | Freq: Every day | NASAL | Status: DC

## 2014-10-05 MED ORDER — TOLTERODINE TARTRATE ER 4 MG PO CP24: 4.0000 mg | ORAL_CAPSULE | Freq: Every day | ORAL | Status: DC

## 2014-10-05 MED ORDER — LORATADINE 10 MG PO TABS: 10.0000 mg | ORAL_TABLET | Freq: Every day | ORAL | Status: DC

## 2014-10-05 MED ORDER — RANITIDINE HCL 150 MG PO CAPS: 150.0000 mg | ORAL_CAPSULE | Freq: Two times a day (BID) | ORAL | Status: DC

## 2014-10-05 NOTE — Progress Notes (Signed)
   Subjective:    Patient ID: Paige Pena, female    DOB: [birth year], 74 y.o.   MRN: [id number]  HPI Here today for CPE.  Risk Factors: Cerumen impaction- wants ENT referral. Hyperlipidemia- chronic problem, on Crestor.  Has been following w/ Lipid clinic.  Pt taking Crestor twice weekly. Hx of CVA- pt is following w/ neuro.  On ASA 325mg  daily.  Goal is BP control and lipid management. Bowel changes- pt is following w/ [facility].  Hx of gastroparesis, IBS.  [facility] is recommending that pt have local provider.  Pt wants to see Dr Oletta Lamas w/ [facility]. Physical Activity: daily walking Fall Risk: low Depression: chronic problem, denies current sxs, on Cymbalta Hearing: decreased to conversational tones due to cerumen impaction ADL's: independent Cognitive: normal linear thought process.  Known memory deficits (following w/ Dr Jannifer Franklin) Home Safety: safe at home Height, Weight, BMI, Visual Acuity: see vitals, vision corrected to 20/20 w/ glasses Counseling: UTD on colonoscopy, pt reports having mammo and DEXA in 'March or April', due for prevnar Care team reviewed and updated Labs Ordered: See A&P Care Plan: See A&P    Review of Systems Patient reports no vision/ hearing changes, adenopathy,fever, weight change, swallowing issues, palpitations, edema, persistant/recurrent cough, hemoptysis, dyspnea (rest/exertional/paroxysmal nocturnal), gastrointestinal bleeding (melena, rectal bleeding), abdominal pain, significant heartburn, bowel changes, GU symptoms (dysuria, hematuria, incontinence), Gyn symptoms (abnormal  bleeding, pain), syncope, focal weakness, memory loss, numbness & tingling, skin/hair/nail changes, abnormal bruising or bleeding, anxiety, or depression.   + hoarseness + CP- intermittent, not related to exertion, resolves spontaneously.  Denies SOB.  Pain is sharp.  Currently asymptomatic.  Hx of similar.    Objective:   Physical Exam General Appearance:    Alert, cooperative, no  distress, appears stated age  Head:    Normocephalic, without obvious abnormality, atraumatic  Eyes:    PERRL, conjunctiva/corneas clear, EOM's intact, fundi    benign, both eyes  Ears:    Normal TM's and external ear canals, both ears  Nose:   Nares normal, septum midline, mucosa normal, no drainage    or sinus tenderness  Throat:   Lips, mucosa, and tongue normal; teeth and gums normal  Neck:   Supple, symmetrical, trachea midline, no adenopathy;    Thyroid: no enlargement/tenderness/nodules  Back:     Symmetric, no curvature, ROM normal, no CVA tenderness  Lungs:     Clear to auscultation bilaterally, respirations unlabored  Chest Wall:    No tenderness or deformity   Heart:    Regular rate and rhythm, S1 and S2 normal, no murmur, rub   or gallop  Breast Exam:    Deferred to GYN  Abdomen:     Soft, non-tender, bowel sounds active all four quadrants,    no masses, no organomegaly  Genitalia:    Deferred to GYN  Rectal:    Extremities:   Extremities normal, atraumatic, no cyanosis or edema  Pulses:   2+ and symmetric all extremities  Skin:   Skin color, texture, turgor normal, no rashes or lesions  Lymph nodes:   Cervical, supraclavicular, and axillary nodes normal  Neurologic:   CNII-XII intact, normal strength, sensation and reflexes    throughout          Assessment & Plan:

## 2014-10-05 NOTE — Progress Notes (Signed)
Pre visit review using our clinic review tool, if applicable. No additional management support is needed unless otherwise documented below in the visit note. 

## 2014-10-05 NOTE — Patient Instructions (Signed)
Follow up in 6 months to recheck BP We'll notify you of your lab results and make any changes if needed We'll call you with your cardiology, GI, and ENT appts You are up to date on colonoscopy- due 2018- mammo and bone density.  Great job!! Keep up the good work on healthy diet and regular exercise- you look great! Call with any questions or concerns Enjoy the rest of your summer!

## 2014-10-08 NOTE — Assessment & Plan Note (Signed)
Pt's PE WNL.  UTD on mammo and DEXA per pt report.  UTD on colonoscopy.  No need for paps.  Written screening schedule updated and given to pt.  Check labs.  Anticipatory guidance provided.

## 2014-10-08 NOTE — Assessment & Plan Note (Signed)
Chronic problem.  Following w/ lipid clinic.  Taking Crestor twice weekly as this seems to be max dose she can tolerate.  Will continue to follow along and assist as able.

## 2014-10-08 NOTE — Assessment & Plan Note (Signed)
Pt has hx of this previously.  Had cards work up 'years ago' that was unrevealing.  Asymptomatic today so will not repeat EKG- she had one done a few months ago.  But given her age, hyperlipidemia, and hx of CVA will refer to cards for complete w/u to r/o ischemic disease.  Reviewed supportive care and red flags that should prompt return.  Pt expressed understanding and is in agreement w/ plan.

## 2014-10-08 NOTE — Assessment & Plan Note (Signed)
Recurrent issue for pt.  ENT referral provided at pt's request

## 2014-10-08 NOTE — Assessment & Plan Note (Signed)
Chronic problem.  Has been following at [facility] but they recommended she have a local GI.  Pt would like referral to [facility]- referral placed.

## 2014-10-08 NOTE — Assessment & Plan Note (Signed)
Pt w/ hx of this.  Following w/ Dr Jannifer Franklin.  Goal is risk reduction in the form of BP and lipid control.  Currently asymptomatic.  Will continue to follow along

## 2014-10-08 NOTE — Assessment & Plan Note (Signed)
Check labs and replete prn. 

## 2014-10-08 NOTE — Assessment & Plan Note (Signed)
Ongoing problem.  sxs are well controlled on current medication.  No changes at this time.

## 2014-10-13 ENCOUNTER — Encounter: Payer: Self-pay | Admitting: Internal Medicine

## 2014-10-13 ENCOUNTER — Ambulatory Visit (INDEPENDENT_AMBULATORY_CARE_PROVIDER_SITE_OTHER): Payer: Medicare Other | Admitting: Internal Medicine

## 2014-10-13 VITALS — BP 118/72 | HR 91 | Temp 98.8°F | Ht 61.0 in | Wt 130.1 lb

## 2014-10-13 DIAGNOSIS — J069 Acute upper respiratory infection, unspecified: Secondary | ICD-10-CM

## 2014-10-13 DIAGNOSIS — H6123 Impacted cerumen, bilateral: Secondary | ICD-10-CM | POA: Diagnosis not present

## 2014-10-13 MED ORDER — AZELASTINE HCL 0.1 % NA SOLN: 2.0000 | Freq: Every evening | NASAL | Status: DC | PRN

## 2014-10-13 MED ORDER — AZITHROMYCIN 250 MG PO TABS: ORAL_TABLET | ORAL | Status: DC

## 2014-10-13 NOTE — Patient Instructions (Signed)
Rest, fluids , tylenol  For cough: Take Mucinex DM twice a day as needed until better  For nasal congestion Use : OTC Nasocort or Flonase : 2 nasal sprays on each side of the nose daily until you feel better Prescribed Astelin: 2 nasal sprays in each side of the nose every night  Take the antibiotic as prescribed  , Zithromax: Only if not improving in the next 3 days  Call if not gradually better over the next  10 days  Call anytime if the symptoms are severe

## 2014-10-13 NOTE — Progress Notes (Signed)
Pre visit review using our clinic review tool, if applicable. No additional management support is needed unless otherwise documented below in the visit note. 

## 2014-10-13 NOTE — Progress Notes (Signed)
Subjective:    Patient ID: Paige Pena, female    DOB: 1941-02-11, 74 y.o.   MRN: [id number]  DOS:  10/13/2014 Type of visit - description : Acute Interval history:  Symptoms started 2 days ago with sore throat, runny nose and cough. Medication list reviewed, she has listed to antihistaminic, she reports that she either take one or the other not both the same day.   Review of Systems  Denies chills, + subjective fever Mild sinus pain and congestion. No nausea, vomiting, diarrhea, mild generalized ache.  Past Medical History  Diagnosis Date  . Dysphonia   . Asthmatic bronchitis   . Chest pain, atypical   . Cerebrovascular disease   . Hypercholesteremia   . GERD (gastroesophageal reflux disease)   . Acute cystitis   . Esophageal stricture   . Gastroparesis   . Diverticulosis of colon   . IBS (irritable bowel syndrome)   . Hemorrhoids   . Urinary incontinence   . Hematuria, microscopic   . DJD (degenerative joint disease)   . Fibromyalgia   . Anxiety   . Vitamin D deficiency   . RLS (restless legs syndrome)   . Aphasia     transsient aphasia Jan 2012  . Memory loss   . Memory deficits 02/15/2013    Past Surgical History  Procedure Laterality Date  . Abdominal hysterectomy  1995  . Corneal transplant for keratonconus    . Right bunion surgery    . Left foot fracture with orif  07/2004    Dr. Sharol Given  . Eye surgery  2013    Social History   Social History  . Marital Status: Married    Spouse Name: Mallie Mussel  . Number of Children: 3  . Years of Education: college-2   Occupational History  .     Social History Main Topics  . Smoking status: Never Smoker   . Smokeless tobacco: Never Used  . Alcohol Use: No  . Drug Use: No  . Sexual Activity: Not on file   Other Topics Concern  . Not on file   Social History Narrative   Lennie Hummer   [employer], daughter   Zia Najera info release form to share her medical records  with her children        Medication List       This list is accurate as of: 10/13/14 11:59 PM.  Always use your most recent med list.               ALPRAZolam 0.5 MG tablet  Commonly known as:  XANAX  TAKE  1/2 -1 TABLET BY MOUTH THREE TIMES DAILY AS NEEDED     azelastine 0.1 % nasal spray  Commonly known as:  ASTELIN  Place 2 sprays into both nostrils at bedtime as needed for rhinitis. Use in each nostril as directed     azithromycin 250 MG tablet  Commonly known as:  ZITHROMAX Z-PAK  2 tabs a day the first day, then 1 tab a day x 4 days     DULoxetine 20 MG capsule  Commonly known as:  CYMBALTA  TAKE ONE CAPSULE BY MOUTH DAILY     ECOTRIN 325 MG EC tablet  Generic drug:  aspirin  Take 325 mg by mouth daily.     fluticasone 50 MCG/ACT nasal spray  Commonly known as:  FLONASE  Place 2 sprays into both nostrils daily.     Geritol Liqd  Take 15 mLs by mouth  daily.     loratadine 10 MG tablet  Commonly known as:  CLARITIN  Take 1 tablet (10 mg total) by mouth daily.     meloxicam 15 MG tablet  Commonly known as:  MOBIC  Take 15 mg by mouth as needed.     omega-3 acid ethyl esters 1 G capsule  Commonly known as:  LOVAZA  Take 2 g by mouth 2 (two) times daily.     omeprazole 40 MG capsule  Commonly known as:  PRILOSEC  TAKE ONE CAPSULE BY MOUTH DAILY     OVER THE COUNTER MEDICATION  Sonipure for sleep.     OVER THE COUNTER MEDICATION  Take 1 tablet by mouth 3 (three) times daily. Pt takes domperidone that she gets mail order from [place]     prednisoLONE acetate 1 % ophthalmic suspension  Commonly known as:  PRED FORTE  Place 1 mL into both eyes daily.     ranitidine 150 MG capsule  Commonly known as:  ZANTAC  Take 1 capsule (150 mg total) by mouth 2 (two) times daily.     rosuvastatin 5 MG tablet  Commonly known as:  CRESTOR  Take 1 tablet by mouth three days each week.     tolterodine 4 MG 24 hr capsule  Commonly known as:  DETROL LA  Take 1  capsule (4 mg total) by mouth at bedtime.     TURMERIC PO  Take 1 tablet by mouth daily. 1050 mg     ZYRTEC ALLERGY 10 MG Caps  Generic drug:  Cetirizine HCl  Take 10 mg by mouth daily. Frequency:PRN   Dosage:0.0     Instructions:  Note:Dose: UNKNOWN           Objective:   Physical Exam BP 118/72 mmHg  Pulse 91  Temp(Src) 98.8 F (37.1 C) (Oral)  Ht 5\' 1"  (1.549 m)  Wt 130 lb 2 oz (59.024 kg)  BMI 24.60 kg/m2  SpO2 99% General:   Well developed, well nourished . NAD.  HEENT:  Normocephalic . Face symmetric, atraumatic . Sinuses slightly TTP bilaterally (maxillary) Ears: Cerumen impaction bilaterally. Throat symmetric, nose quite congested Lungs:  CTA B Normal respiratory effort, no intercostal retractions, no accessory muscle use. Heart: RRR,  no murmur.  No pretibial edema bilaterally  Skin: Not pale. Not jaundice Neurologic:  alert & oriented X3.  Speech normal, gait appropriate for age and unassisted Psych--  Cognition and judgment appear intact.  Cooperative with normal attention span and concentration.  Behavior appropriate. No anxious or depressed appearing.      Assessment & Plan:   Cerumen impaction: Cerumen was removed from the right ear w/ a spoon by me, tympanic membrane was normal. Unable to remove any cerumen from the left ear  URI: Recommend conservative treatment, to take antibiotics only if she is not improving in 3 or 4 days.

## 2014-12-19 ENCOUNTER — Other Ambulatory Visit: Payer: Self-pay | Admitting: Family Medicine

## 2014-12-20 NOTE — Telephone Encounter (Signed)
Medication filled to pharmacy as requested.   

## 2014-12-21 ENCOUNTER — Other Ambulatory Visit: Payer: Self-pay

## 2015-01-04 ENCOUNTER — Other Ambulatory Visit (INDEPENDENT_AMBULATORY_CARE_PROVIDER_SITE_OTHER): Payer: Medicare Other | Admitting: *Deleted

## 2015-01-04 DIAGNOSIS — E7800 Pure hypercholesterolemia, unspecified: Secondary | ICD-10-CM | POA: Diagnosis not present

## 2015-01-04 LAB — LIPID PANEL
CHOL/HDL RATIO: 2 ratio (ref <=5.0)
CHOLESTEROL: 168 mg/dL (ref 125–200)
HDL: 82 mg/dL (ref >=46)
LDL Cholesterol: 72 mg/dL (ref <130)
TRIGLYCERIDES: 69 mg/dL (ref <150)
VLDL: 14 mg/dL (ref <30)

## 2015-01-04 LAB — HEPATIC FUNCTION PANEL
ALBUMIN: 4.2 g/dL (ref 3.6–5.1)
ALT: 13 U/L (ref 6–29)
AST: 26 U/L (ref 10–35)
Alkaline Phosphatase: 91 U/L (ref 33–130)
BILIRUBIN DIRECT: 0.1 mg/dL (ref <=0.2)
BILIRUBIN TOTAL: 0.5 mg/dL (ref 0.2–1.2)
Indirect Bilirubin: 0.4 mg/dL (ref 0.2–1.2)
Total Protein: 7.3 g/dL (ref 6.1–8.1)

## 2015-01-04 NOTE — Addendum Note (Signed)
Addended by: Eulis Foster on: 01/04/2015 02:05 PM   Modules accepted: Orders

## 2015-01-05 ENCOUNTER — Ambulatory Visit (INDEPENDENT_AMBULATORY_CARE_PROVIDER_SITE_OTHER): Payer: Medicare Other | Admitting: Pharmacist

## 2015-01-05 DIAGNOSIS — E7800 Pure hypercholesterolemia, unspecified: Secondary | ICD-10-CM

## 2015-01-05 NOTE — Progress Notes (Signed)
Patient ID: Paige Pena                 DOB: Jun 03, 1940, 74 yo                         MRN: [id number]     HPI: Paige Pena is a 74 y.o. female patient who was originally referred by Dr. Lenna Gilford to lipid clinic a few years back. Patient has a history of cerebrovascular disease but no CAD or DM. HTN is listed as a problem but she does not take any medications and all of her recent BPs have ben normal.    Current Medications: Crestor 5mg  BIW Intolerances: muscle aches with daily Crestor, pt has some aches at baseline becausse of fibromyalgia.  Risk Factors: remote history of TIA, however this was non-atherogenic in origin LDL goal: 100mg /dL aggressive goal for primary prevention given remote history of cerebrovascular disease  Diet: oatmeal or boiled egg for breakfast. Cheese toast or applesause for breakfast. Snacks on fruit. Dinner may be a salad. Sometimes eats sweets.  Exercise: Going to the gym more, has a Physiological scientist.   Family History: Non-contributory. Does report that her maternal grandparents had "hardening of the arteries" but no events that she was aware of.  Social History: Patient reports that she does not smoke, drink alcohol, or use drugs.  Labs: 12/2014: TC 168, TG 69, HDL 82, LDL 72, LFTs wnl (Crestor 5mg  BIW) 08/2014: TC 229, TG 75, HDL 81.1, LDL 133, LFTs wnl (no therapy)   Past Medical History  Diagnosis Date  . Dysphonia   . Asthmatic bronchitis   . Chest pain, atypical   . Cerebrovascular disease   . Hypercholesteremia   . GERD (gastroesophageal reflux disease)   . Acute cystitis   . Esophageal stricture   . Gastroparesis   . Diverticulosis of colon   . IBS (irritable bowel syndrome)   . Hemorrhoids   . Urinary incontinence   . Hematuria, microscopic   . DJD (degenerative joint disease)   . Fibromyalgia   . Anxiety   . Vitamin D deficiency   . RLS (restless legs syndrome)   . Aphasia     transsient aphasia Jan 2012  . Memory loss   .  Memory deficits 02/15/2013    Current Outpatient Prescriptions on File Prior to Visit  Medication Sig Dispense Refill  . ALPRAZolam (XANAX) 0.5 MG tablet TAKE  1/2 -1 TABLET BY MOUTH THREE TIMES DAILY AS NEEDED 90 tablet 2  . aspirin (ECOTRIN) 325 MG EC tablet Take 325 mg by mouth daily.      Marland Kitchen azelastine (ASTELIN) 0.1 % nasal spray Place 2 sprays into both nostrils at bedtime as needed for rhinitis. Use in each nostril as directed 30 mL 3  . azithromycin (ZITHROMAX Z-PAK) 250 MG tablet 2 tabs a day the first day, then 1 tab a day x 4 days 6 tablet 0  . Cetirizine HCl (ZYRTEC ALLERGY) 10 MG CAPS Take 10 mg by mouth daily. Frequency:PRN   Dosage:0.0     Instructions:  Note:Dose: UNKNOWN    . DULoxetine (CYMBALTA) 20 MG capsule TAKE ONE CAPSULE BY MOUTH DAILY 30 capsule 3  . fluticasone (FLONASE) 50 MCG/ACT nasal spray Place 2 sprays into both nostrils daily. 16 g 2  . Iron-Vitamins (GERITOL) LIQD Take 15 mLs by mouth daily.    Marland Kitchen loratadine (CLARITIN) 10 MG tablet Take 1 tablet (10 mg total) by mouth daily.  90 tablet 1  . meloxicam (MOBIC) 15 MG tablet Take 15 mg by mouth as needed.     Marland Kitchen omega-3 acid ethyl esters (LOVAZA) 1 G capsule Take 2 g by mouth 2 (two) times daily.    Marland Kitchen omeprazole (PRILOSEC) 40 MG capsule TAKE ONE CAPSULE BY MOUTH DAILY 90 capsule 1  . OVER THE COUNTER MEDICATION Take 1 tablet by mouth 3 (three) times daily. Pt takes domperidone that she gets mail order from [place]    . OVER THE COUNTER MEDICATION Sonipure for sleep.    . prednisoLONE acetate (PRED FORTE) 1 % ophthalmic suspension Place 1 mL into both eyes daily.    . ranitidine (ZANTAC) 150 MG capsule Take 1 capsule (150 mg total) by mouth 2 (two) times daily. 180 capsule 1  . rosuvastatin (CRESTOR) 5 MG tablet Take 1 tablet by mouth three days each week. 50 tablet 1  . tolterodine (DETROL LA) 4 MG 24 hr capsule Take 1 capsule (4 mg total) by mouth at bedtime. 90 capsule 1  . TURMERIC PO Take 1 tablet by mouth daily.  1050 mg     No current facility-administered medications on file prior to visit.    Allergies  Allergen Reactions  . Metoclopramide Hcl     REACTION: anxious and out of her mind  . Morphine     REACTION: lethargy/malaise  . Penicillins     Rash, itching  . Prednisone     REACTION: rash/hives  . Sulfonamide Derivatives     REACTION: itching    Assessment/Plan:  1. Hyperlipidemia - Patient with LDL at goal 100mg /dL taking Crestor 5mg  twice weekly for primary prevention with remote history of TIA (non-atherogenic origin). Will continue with current regimen. Patient would like lipid panel rechecked in 6 months. Lab orders placed, will f/u with patient by phone at that time.   Megan E. Supple, PharmD [facility] [id number] [place], [place] [address] Phone: [phone]; Fax: [phone] 01/05/2015 1:25 PM

## 2015-02-14 ENCOUNTER — Telehealth: Payer: Self-pay | Admitting: Medical

## 2015-02-14 ENCOUNTER — Encounter: Payer: Medicare Other | Admitting: Medical

## 2015-02-14 NOTE — Progress Notes (Signed)
This encounter was created in error - please disregard.

## 2015-02-15 ENCOUNTER — Ambulatory Visit (INDEPENDENT_AMBULATORY_CARE_PROVIDER_SITE_OTHER): Payer: Medicare Other | Admitting: Family Medicine

## 2015-02-15 ENCOUNTER — Encounter: Payer: Self-pay | Admitting: Family Medicine

## 2015-02-15 VITALS — BP 106/80 | HR 90 | Temp 98.0°F | Resp 16 | Ht 59.0 in | Wt 133.0 lb

## 2015-02-15 DIAGNOSIS — K219 Gastro-esophageal reflux disease without esophagitis: Secondary | ICD-10-CM | POA: Diagnosis not present

## 2015-02-15 DIAGNOSIS — R1010 Upper abdominal pain, unspecified: Secondary | ICD-10-CM

## 2015-02-15 LAB — BASIC METABOLIC PANEL
BUN: 16 mg/dL (ref 6–23)
CALCIUM: 9.5 mg/dL (ref 8.4–10.5)
CO2: 32 meq/L (ref 19–32)
CREATININE: 0.88 mg/dL (ref 0.40–1.20)
Chloride: 104 mEq/L (ref 96–112)
GFR: 80.4 mL/min (ref >60.00)
GLUCOSE: 88 mg/dL (ref 70–99)
Potassium: 4 mEq/L (ref 3.5–5.1)
Sodium: 142 mEq/L (ref 135–145)

## 2015-02-15 LAB — CBC WITH DIFFERENTIAL/PLATELET
BASOS ABS: 0 10*3/uL (ref 0.0–0.1)
Basophils Relative: 0.5 % (ref 0.0–3.0)
EOS PCT: 3.4 % (ref 0.0–5.0)
Eosinophils Absolute: 0.2 10*3/uL (ref 0.0–0.7)
HEMATOCRIT: 39.5 % (ref 36.0–46.0)
HEMOGLOBIN: 13 g/dL (ref 12.0–15.0)
LYMPHS ABS: 1.6 10*3/uL (ref 0.7–4.0)
LYMPHS PCT: 31.3 % (ref 12.0–46.0)
MCHC: 32.8 g/dL (ref 30.0–36.0)
MCV: 90 fl (ref 78.0–100.0)
MONOS PCT: 7.6 % (ref 3.0–12.0)
Monocytes Absolute: 0.4 10*3/uL (ref 0.1–1.0)
NEUTROS PCT: 57.2 % (ref 43.0–77.0)
Neutro Abs: 2.9 10*3/uL (ref 1.4–7.7)
Platelets: 163 10*3/uL (ref 150.0–400.0)
RBC: 4.39 Mil/uL (ref 3.87–5.11)
RDW: 13.7 % (ref 11.5–15.5)
WBC: 5 10*3/uL (ref 4.0–10.5)

## 2015-02-15 LAB — HEPATIC FUNCTION PANEL
ALBUMIN: 3.7 g/dL (ref 3.5–5.2)
ALT: 15 U/L (ref 0–35)
AST: 21 U/L (ref 0–37)
Alkaline Phosphatase: 82 U/L (ref 39–117)
Bilirubin, Direct: 0.1 mg/dL (ref 0.0–0.3)
TOTAL PROTEIN: 6.9 g/dL (ref 6.0–8.3)
Total Bilirubin: 0.6 mg/dL (ref 0.2–1.2)

## 2015-02-15 LAB — H. PYLORI ANTIBODY, IGG: H Pylori IgG: NEGATIVE

## 2015-02-15 MED ORDER — RANITIDINE HCL 300 MG PO TABS: 300.0000 mg | ORAL_TABLET | Freq: Every day | ORAL | Status: DC

## 2015-02-15 MED ORDER — GI COCKTAIL ~~LOC~~
30.0000 mL | Freq: Once | ORAL | Status: AC
  Administered 2015-02-15: 30 mL via ORAL

## 2015-02-15 MED ORDER — SUCRALFATE 1 G PO TABS: 1.0000 g | ORAL_TABLET | Freq: Three times a day (TID) | ORAL | Status: DC

## 2015-02-15 MED ORDER — ONDANSETRON HCL 4 MG PO TABS: 4.0000 mg | ORAL_TABLET | Freq: Three times a day (TID) | ORAL | Status: DC | PRN

## 2015-02-15 NOTE — Assessment & Plan Note (Signed)
Suspect that this is related to worsening GERD but pt does have hx of gastroparesis and IBS.  Start Zofran prn.  Check labs.  Refer to GI- pt prefers Dr Collene Mares after she didn't feel like she had a rapport w/ the doctor at [facility].  Referral placed.  Reviewed supportive care and red flags that should prompt return.  Pt expressed understanding and is in agreement w/ plan.

## 2015-02-15 NOTE — Progress Notes (Signed)
   Subjective:    Patient ID: Paige Pena, female    DOB: 1940-10-24, 74 y.o.   MRN: [id number]  HPI Nausea- pt saw [facility] and saw Dr Oletta Lamas.  Pt doesn't feel 'like we're going to gel'.  Pt would like to see Dr Collene Mares instead.  Pt started w/ 'nausea and diarrhea and belching' 1 week ago.  Pt reports stomach is now 'tender and achy' and BMs are now 'little pellets'.  Eating applesauce, chicken noodle soap, mashed potatoes.  No current vomiting.  Pt is not currently on a bowel regimen.  Pt is taking Domperidone from [place].   Review of Systems For ROS see HPI     Objective:   Physical Exam  Constitutional: She is oriented to person, place, and time. She appears well-developed and well-nourished. No distress.  HENT:  Head: Normocephalic and atraumatic.  Cardiovascular: Normal rate, regular rhythm and normal heart sounds.   Pulmonary/Chest: Effort normal and breath sounds normal. No respiratory distress. She has no wheezes. She has no rales.  Abdominal: Soft. Bowel sounds are normal. She exhibits no distension. There is no tenderness. There is no rebound and no guarding.  Neurological: She is alert and oriented to person, place, and time.  Skin: Skin is warm and dry.  Vitals reviewed.         Assessment & Plan:

## 2015-02-15 NOTE — Telephone Encounter (Signed)
Pt was no show 02/14/15 2:15pm for acute appt, pt came in 12/22 with Dr. [name] for same issue, charge or no charge?

## 2015-02-15 NOTE — Telephone Encounter (Signed)
charge 

## 2015-02-15 NOTE — Progress Notes (Signed)
Pre visit review using our clinic review tool, if applicable. No additional management support is needed unless otherwise documented below in the visit note. 

## 2015-02-15 NOTE — Patient Instructions (Addendum)
Follow up as needed/scheduled We'll notify you of your lab results and make any changes if needed Continue the Omeprazole Add the Ranitidine nightly Take the Carafate prior to eating for 3-4 weeks to allow healing We'll call you with your GI appt Use the Zofran as needed for nausea Call with any questions or concerns If you want to join [place] at the new [place] office, any scheduled appointments will automatically transfer and we will see you at [address] [place] [address] [place], [place] [address] (OPENING 02/27/15) Happy Holidays!

## 2015-02-15 NOTE — Assessment & Plan Note (Signed)
Deteriorated.  Pt is not getting relief from her Omeprazole- will add Ranitidine nightly.  Check labs to r/o H pylori.  Her current discomfort improved w/ GI cocktail in office.  Start Carafate to allow gastritis healing.  Reviewed supportive care and red flags that should prompt return.  Pt expressed understanding and is in agreement w/ plan.

## 2015-02-27 ENCOUNTER — Ambulatory Visit (INDEPENDENT_AMBULATORY_CARE_PROVIDER_SITE_OTHER): Payer: Medicare Other | Admitting: Family Medicine

## 2015-02-27 ENCOUNTER — Encounter: Payer: Self-pay | Admitting: Family Medicine

## 2015-02-27 VITALS — BP 126/78 | HR 84 | Temp 98.0°F | Resp 16 | Ht 59.0 in | Wt 133.2 lb

## 2015-02-27 DIAGNOSIS — K582 Mixed irritable bowel syndrome: Secondary | ICD-10-CM

## 2015-02-27 MED ORDER — DICYCLOMINE HCL 20 MG PO TABS: 20.0000 mg | ORAL_TABLET | Freq: Three times a day (TID) | ORAL | Status: DC

## 2015-02-27 NOTE — Progress Notes (Signed)
Pre visit review using our clinic review tool, if applicable. No additional management support is needed unless otherwise documented below in the visit note. 

## 2015-02-27 NOTE — Assessment & Plan Note (Signed)
Deteriorated.  Suspect that pt's alternating diarrhea/constipation, abdominal discomfort and bloating are all a function of her IBS.  Start Bentyl prn. Due to hx of gastroparesis, will get [place] to assess. Reviewed lifestyle modifications.  Will check on status of GI referral.  Reviewed supportive care and red flags that should prompt return.  Pt expressed understanding and is in agreement w/ plan.

## 2015-02-27 NOTE — Progress Notes (Signed)
   Subjective:    Patient ID: Paige Pena, female    DOB: [birth year], 75 y.o.   MRN: [id number]  HPI GI f/u- we added Ranitidine to pt's Omeprazole daily when she was seen last month.  Pt reports 'momentary' improvement w/ addition of Ranitidine.  But she reports that she continues to have 'aching' in her stomach.  Alternating between diarrhea and constipation.  Hx of Gastroparesis, IBS.  Pt reports 'a couple of days of relief' w/ Zantac and Carafate but 'now we're right back'.  Pt is not currently on IBS medication.  Pt reports stools have been 'very dark'.  No N/V.  + bloating and abd pain.  Has GI referral pending.   Review of Systems For ROS see HPI     Objective:   Physical Exam  Constitutional: She is oriented to person, place, and time. She appears well-developed and well-nourished. No distress.  HENT:  Head: Normocephalic and atraumatic.  Eyes: Conjunctivae and EOM are normal. Pupils are equal, round, and reactive to light.  Abdominal: Soft. Bowel sounds are normal. She exhibits no distension. There is no tenderness. There is no rebound and no guarding.  Neurological: She is alert and oriented to person, place, and time.  Skin: Skin is warm and dry.  Psychiatric: She has a normal mood and affect. Her behavior is normal. Thought content normal.  Vitals reviewed.         Assessment & Plan:

## 2015-02-27 NOTE — Patient Instructions (Signed)
Follow up as needed (or for your physical exam in August) We'll notify you of your GI appt Someone will contact you with your abdominal US Please start the Bentyl (Dicyclomine) as needed for abdominal spasm Please complete the stool card and return it as directed Continue the Omeprazole and Ranitidine Call with any questions or concerns Hang in there! Happy New Year!!

## 2015-02-28 ENCOUNTER — Ambulatory Visit (HOSPITAL_BASED_OUTPATIENT_CLINIC_OR_DEPARTMENT_OTHER)
Admission: RE | Admit: 2015-02-28 | Discharge: 2015-02-28 | Disposition: A | Discharge status: Home / Self Care | Payer: Medicare Other | Source: Ambulatory Visit | Attending: Family Medicine | Admitting: Family Medicine

## 2015-02-28 DIAGNOSIS — R109 Unspecified abdominal pain: Secondary | ICD-10-CM | POA: Insufficient documentation

## 2015-02-28 DIAGNOSIS — K582 Mixed irritable bowel syndrome: Secondary | ICD-10-CM | POA: Diagnosis not present

## 2015-03-16 ENCOUNTER — Encounter (HOSPITAL_COMMUNITY): Payer: Self-pay | Admitting: *Deleted

## 2015-03-16 ENCOUNTER — Telehealth: Payer: Self-pay | Admitting: Family Medicine

## 2015-03-16 ENCOUNTER — Emergency Department (HOSPITAL_COMMUNITY)
Admission: EM | Admit: 2015-03-16 | Discharge: 2015-03-16 | Disposition: A | Discharge status: Home / Self Care | Payer: Medicare Other | Attending: Emergency Medicine | Admitting: Emergency Medicine

## 2015-03-16 ENCOUNTER — Emergency Department (HOSPITAL_COMMUNITY): Payer: Medicare Other

## 2015-03-16 DIAGNOSIS — E7800 Pure hypercholesterolemia, unspecified: Secondary | ICD-10-CM | POA: Insufficient documentation

## 2015-03-16 DIAGNOSIS — W2209XA Striking against other stationary object, initial encounter: Secondary | ICD-10-CM | POA: Insufficient documentation

## 2015-03-16 DIAGNOSIS — Z7982 Long term (current) use of aspirin: Secondary | ICD-10-CM | POA: Diagnosis not present

## 2015-03-16 DIAGNOSIS — Z79899 Other long term (current) drug therapy: Secondary | ICD-10-CM | POA: Diagnosis not present

## 2015-03-16 DIAGNOSIS — S060X0A Concussion without loss of consciousness, initial encounter: Secondary | ICD-10-CM | POA: Diagnosis not present

## 2015-03-16 DIAGNOSIS — Z87448 Personal history of other diseases of urinary system: Secondary | ICD-10-CM | POA: Insufficient documentation

## 2015-03-16 DIAGNOSIS — R42 Dizziness and giddiness: Secondary | ICD-10-CM | POA: Diagnosis present

## 2015-03-16 DIAGNOSIS — Z7952 Long term (current) use of systemic steroids: Secondary | ICD-10-CM | POA: Diagnosis not present

## 2015-03-16 DIAGNOSIS — Y998 Other external cause status: Secondary | ICD-10-CM | POA: Insufficient documentation

## 2015-03-16 DIAGNOSIS — F419 Anxiety disorder, unspecified: Secondary | ICD-10-CM | POA: Diagnosis not present

## 2015-03-16 DIAGNOSIS — Z880 Allergy status to penicillin: Secondary | ICD-10-CM | POA: Diagnosis not present

## 2015-03-16 DIAGNOSIS — S0990XA Unspecified injury of head, initial encounter: Secondary | ICD-10-CM

## 2015-03-16 DIAGNOSIS — M1990 Unspecified osteoarthritis, unspecified site: Secondary | ICD-10-CM | POA: Insufficient documentation

## 2015-03-16 DIAGNOSIS — K219 Gastro-esophageal reflux disease without esophagitis: Secondary | ICD-10-CM | POA: Insufficient documentation

## 2015-03-16 DIAGNOSIS — Y9389 Activity, other specified: Secondary | ICD-10-CM | POA: Insufficient documentation

## 2015-03-16 DIAGNOSIS — J45909 Unspecified asthma, uncomplicated: Secondary | ICD-10-CM | POA: Insufficient documentation

## 2015-03-16 DIAGNOSIS — E785 Hyperlipidemia, unspecified: Secondary | ICD-10-CM | POA: Diagnosis not present

## 2015-03-16 DIAGNOSIS — Y9289 Other specified places as the place of occurrence of the external cause: Secondary | ICD-10-CM | POA: Insufficient documentation

## 2015-03-16 DIAGNOSIS — Z8673 Personal history of transient ischemic attack (TIA), and cerebral infarction without residual deficits: Secondary | ICD-10-CM | POA: Insufficient documentation

## 2015-03-16 HISTORY — DX: Transient cerebral ischemic attack, unspecified: G45.9

## 2015-03-16 LAB — COMPREHENSIVE METABOLIC PANEL
ALT: 15 U/L (ref 14–54)
AST: 23 U/L (ref 15–41)
Albumin: 3.7 g/dL (ref 3.5–5.0)
Alkaline Phosphatase: 85 U/L (ref 38–126)
Anion gap: 8 (ref 5–15)
BUN: 12 mg/dL (ref 6–20)
CHLORIDE: 106 mmol/L (ref 101–111)
CO2: 27 mmol/L (ref 22–32)
CREATININE: 0.91 mg/dL (ref 0.44–1.00)
Calcium: 9.1 mg/dL (ref 8.9–10.3)
Glucose, Bld: 95 mg/dL (ref 65–99)
POTASSIUM: 3.9 mmol/L (ref 3.5–5.1)
Sodium: 141 mmol/L (ref 135–145)
TOTAL PROTEIN: 6.7 g/dL (ref 6.5–8.1)
Total Bilirubin: 0.5 mg/dL (ref 0.3–1.2)

## 2015-03-16 LAB — DIFFERENTIAL
BASOS ABS: 0 10*3/uL (ref 0.0–0.1)
BASOS PCT: 1 %
EOS ABS: 0.1 10*3/uL (ref 0.0–0.7)
Eosinophils Relative: 2 %
Lymphocytes Relative: 42 %
Lymphs Abs: 1.8 10*3/uL (ref 0.7–4.0)
MONO ABS: 0.3 10*3/uL (ref 0.1–1.0)
MONOS PCT: 8 %
NEUTROS ABS: 2 10*3/uL (ref 1.7–7.7)
Neutrophils Relative %: 47 %

## 2015-03-16 LAB — PROTIME-INR
INR: 1.16 (ref 0.00–1.49)
PROTHROMBIN TIME: 15 s (ref 11.6–15.2)

## 2015-03-16 LAB — CBC
HCT: 39 % (ref 36.0–46.0)
Hemoglobin: 12.8 g/dL (ref 12.0–15.0)
MCH: 29.6 pg (ref 26.0–34.0)
MCHC: 32.8 g/dL (ref 30.0–36.0)
MCV: 90.3 fL (ref 78.0–100.0)
PLATELETS: 146 10*3/uL — AB (ref 150–400)
RBC: 4.32 MIL/uL (ref 3.87–5.11)
RDW: 13.1 % (ref 11.5–15.5)
WBC: 4.3 10*3/uL (ref 4.0–10.5)

## 2015-03-16 LAB — I-STAT TROPONIN, ED: TROPONIN I, POC: 0.01 ng/mL (ref 0.00–0.08)

## 2015-03-16 LAB — APTT: APTT: 28 s (ref 24–37)

## 2015-03-16 NOTE — ED Notes (Signed)
Pt ambulates by self to bathroom with no assistance and a steady gait.

## 2015-03-16 NOTE — Telephone Encounter (Signed)
Pt called in stating she has hit her head 2x in past 2 weeks. She said her vision isn't clear and thinks she has a concussion. Transferred to [facility].

## 2015-03-16 NOTE — ED Notes (Signed)
Pt reports bumping her head twice recently, now having dizziness and "feeling off balance" for at least two weeks. Grips are equal, speech clear, no neuro deficits noted at triage.

## 2015-03-16 NOTE — Telephone Encounter (Signed)
Patient Name: Paige Pena  DOB: August 28, 1940    Initial Comment Caller states she has hit her head a week ago, bathroom vanity. Tuesday, she was putting dishes away and hit her head with the dishwasher. Her vision is different and she is sleepy. Dizzy, when walking. Body goes to the left when she is walking.   Nurse Assessment  Nurse: Raphael Gibney, RN, Vanita Ingles Date/Time Eilene Ghazi Time): 03/16/2015 2:36:47 PM  Confirm and document reason for call. If symptomatic, describe symptoms. You must click the next button to save text entered. ---Caller states she hit her bathroom vanity a week ago with her head above her left eye. This week, she hit the corner of her cabinet door on her head above her left eye. Her equilibrium seems to be off. She is leaning to the left. She is feeling sleepy and tired.  Has the patient traveled out of the country within the last 30 days? ---No  Does the patient have any new or worsening symptoms? ---Yes  Will a triage be completed? ---Yes  Related visit to physician within the last 2 weeks? ---No  Does the PT have any chronic conditions? (i.e. diabetes, asthma, etc.) ---Yes  List chronic conditions. ---fibromyalgia  Is this a behavioral health or substance abuse call? ---No     Guidelines    Guideline Title Affirmed Question Affirmed Notes  Head Injury [1] Loss of vision or double vision AND [2] present now    Final Disposition User   Go to ED Now Raphael Gibney, RN, Vera    Comments  Vision is different. Says it feels like a "shade" is over her eyes.   Referrals  [facility] - ED   Disagree/Comply: Comply

## 2015-03-16 NOTE — Telephone Encounter (Signed)
Called to follow up with patient.  She says that she's on her way to the ER now.  She said she wanted to wait until her husband could go with her.

## 2015-03-16 NOTE — ED Notes (Signed)
Pt is in stable condition upon d/c and ambulates from ED. 

## 2015-03-16 NOTE — ED Provider Notes (Signed)
CSN: [id number]     Arrival date & time 03/16/15  1802 History   First MD Initiated Contact with Patient 03/16/15 2204     Chief Complaint  Patient presents with  . Dizziness     (Consider location/radiation/quality/duration/timing/severity/associated sxs/prior Treatment) HPI   75 year old female who presents with dizziness. History of CVA, hyperlipidemia, GERD. states that she has been in her usual state of health. 2 weeks ago, hit herself on the top of her head against a cabinet after bending over to pick up something. I did not have loss of consciousness, and routinely went about her typical activities. One week ago, hit herself in the head again on a cabinet door after leaning over to put dishes in the dishwasher. States that she hit her head very hard, but did not have any loss of consciousness. Since she had her head, states that she has been feeling foggy, with intermittent gait instability. Denies any lightheadedness, syncope, numbness or weakness, diplopia or vision changes, severe headaches, nausea or vomiting, or speech changes. Does say that she has difficulty concentrating when engaged in conversation with other people, and occasionally with some blurry vision after reading for a long time. Discussed her symptoms with her primary care doctor's office today, and states that she was sent to the ED for evaluation.   Past Medical History  Diagnosis Date  . Dysphonia   . Asthmatic bronchitis   . Chest pain, atypical   . Cerebrovascular disease   . Hypercholesteremia   . GERD (gastroesophageal reflux disease)   . Acute cystitis   . Esophageal stricture   . Gastroparesis   . Diverticulosis of colon   . IBS (irritable bowel syndrome)   . Hemorrhoids   . Urinary incontinence   . Hematuria, microscopic   . DJD (degenerative joint disease)   . Fibromyalgia   . Anxiety   . Vitamin D deficiency   . RLS (restless legs syndrome)   . Aphasia     transsient aphasia Jan 2012  . Memory  loss   . Memory deficits 02/15/2013  . TIA (transient ischemic attack)    Past Surgical History  Procedure Laterality Date  . Abdominal hysterectomy  1995  . Corneal transplant for keratonconus    . Right bunion surgery    . Left foot fracture with orif  07/2004    Dr. Sharol Given  . Eye surgery  2013   Family History  Problem Relation Age of Onset  . Breast cancer      aunt  . Diabetes      aunt,brother,uncle  . Kidney disease      father  . Hyperlipidemia Sister   . Diabetes Brother   . Hypertension Mother   . Anemia Mother   . Kidney disease Father   . Dementia Maternal Grandmother    Social History  Substance Use Topics  . Smoking status: Never Smoker   . Smokeless tobacco: Never Used  . Alcohol Use: No   OB History    No data available     Review of Systems 10/14 systems reviewed and are negative other than those stated in the HPI    Allergies  Morphine; Metoclopramide hcl; Penicillins; Prednisone; and Sulfonamide derivatives  Home Medications   Prior to Admission medications   Medication Sig Start Date End Date Taking? Authorizing Provider  aspirin (ECOTRIN) 325 MG EC tablet Take 325 mg by mouth daily.     Yes Historical Provider, MD  azelastine (ASTELIN) 0.1 % nasal spray  Place 2 sprays into both nostrils at bedtime as needed for rhinitis. Use in each nostril as directed 10/13/14  Yes Colon Branch, MD  DULoxetine (CYMBALTA) 20 MG capsule TAKE ONE CAPSULE BY MOUTH DAILY 12/20/14  Yes [name], MD  fluticasone [facility]) 50 MCG/ACT nasal spray Place 2 sprays into both nostrils daily. Patient taking differently: Place 2 sprays into both nostrils daily as needed for allergies.  10/05/14  Yes [name], MD  Iron-Vitamins (GERITOL) LIQD Take 15 mLs by mouth daily.   Yes Historical Provider, MD  KRILL OIL OMEGA-3 PO Take 1 capsule by mouth daily.   Yes Historical Provider, MD  loratadine (CLARITIN) 10 MG tablet Take 1 tablet (10 mg total) by mouth  daily. Patient taking differently: Take 10 mg by mouth daily as needed for allergies.  10/05/14  Yes [name], MD  omeprazole (PRILOSEC) 40 MG capsule TAKE ONE CAPSULE BY MOUTH DAILY 10/05/14  Yes [name], MD  OVER THE COUNTER MEDICATION Take 1 tablet by mouth 3 (three) times daily. Pt takes domperidone that she gets mail order from [place]   Yes Historical Provider, MD  [place] Take 1 tablet by mouth at bedtime. Sonipure for sleep.   Yes Historical Provider, MD  prednisoLONE acetate (PRED FORTE) 1 % ophthalmic suspension Place 1 mL into both eyes daily. 12/22/12  Yes Historical Provider, MD  ranitidine (ZANTAC) 300 MG tablet Take 1 tablet (300 mg total) by mouth at bedtime. Patient taking differently: Take 300 mg by mouth daily as needed for heartburn.  02/15/15  Yes [name], MD  rosuvastatin (CRESTOR) 5 MG tablet Take 1 tablet by mouth three days each week. Patient taking differently: Take 5 mg by mouth 2 (two) times a week. Take 1 tablet by mouth three days each week. 06/20/14  Yes Josue Hector, MD  tolterodine (DETROL LA) 4 MG 24 hr capsule Take 1 capsule (4 mg total) by mouth at bedtime. 10/05/14  Yes [name], MD  ALPRAZolam Duanne Moron) 0.5 MG tablet TAKE  1/2 -1 TABLET BY MOUTH THREE TIMES DAILY AS NEEDED 05/23/13   [name], MD  dicyclomine (BENTYL) 20 MG tablet Take 1 tablet (20 mg total) by mouth 4 (four) times daily -  before meals and at bedtime. 02/27/15   [name], MD  omega-3 acid ethyl esters (LOVAZA) 1 G capsule Take 2 g by mouth 2 (two) times daily.    Historical Provider, MD  ondansetron (ZOFRAN) 4 MG tablet Take 1 tablet (4 mg total) by mouth every 8 (eight) hours as needed for nausea or vomiting. 02/15/15   [name], MD  sucralfate (CARAFATE) 1 G tablet Take 1 tablet (1 g total) by mouth 4 (four) times daily -  with meals and at bedtime. 02/15/15   [name], MD   BP 150/89 mmHg  Pulse 77   Temp(Src) 98.1 F (36.7 C) (Oral)  Resp 16  SpO2 100% Physical Exam Physical Exam  Nursing note and vitals reviewed. Constitutional: Well developed, well nourished, non-toxic, and in no acute distress Head: Normocephalic and atraumatic.  Mouth/Throat: Oropharynx is clear and moist.  Neck: Normal range of motion. Neck supple.  Cardiovascular: Normal rate and regular rhythm.   Pulmonary/Chest: Effort normal and breath sounds normal.  Abdominal: Soft. There is no tenderness. There is no rebound and no guarding.  Musculoskeletal: Normal range of motion.  Skin: Skin is warm and dry.  Psychiatric:  Cooperative Neurological:  Alert, oriented to person, place, time, and situation. Memory grossly in tact. Fluent speech. No dysarthria or aphasia.  Cranial nerves: VF are full. Pupils are symmetric, and reactive to light. EOMI without nystagmus. No gaze deviation. Facial muscles symmetric with activation. Sensation to light touch over face in tact bilaterally. Hearing grossly in tact. Palate elevates symmetrically. Head turn and shoulder shrug are intact. Tongue midline.  Reflexes defered.  Muscle bulk and tone normal. No pronator drift. Moves all extremities symmetrically. Sensation to light touch is in tact throughout in bilateral upper and lower extremities. Coordination reveals no dysmetria with finger to nose. Gait is narrow-based and steady. Non-ataxic.   ED Course  Procedures (including critical care time) Labs Review Labs Reviewed  CBC - Abnormal; Notable for the following:    Platelets 146 (*)    All other components within normal limits  PROTIME-INR  APTT  DIFFERENTIAL  COMPREHENSIVE METABOLIC PANEL  I-STAT TROPOININ, ED    Imaging Review Ct Head Wo Contrast  03/16/2015  CLINICAL DATA:  Recent head injury with dizziness, initial encounter EXAM: CT HEAD WITHOUT CONTRAST TECHNIQUE: Contiguous axial images were obtained from the base of the skull through the vertex without  intravenous contrast. COMPARISON:  01/29/2011 FINDINGS: Bony calvarium is intact. No findings to suggest acute hemorrhage, acute infarction or space-occupying mass lesion are noted. IMPRESSION: No acute intracranial abnormality noted. Electronically Signed   By: Inez Catalina M.D.   On: 03/16/2015 19:49   I have personally reviewed and evaluated these images and lab results as part of my medical decision-making.   EKG Interpretation   Date/Time:  Friday March 16 2015 18:15:34 EST Ventricular Rate:  65 PR Interval:  148 QRS Duration: 78 QT Interval:  392 QTC Calculation: 407 R Axis:   47 Text Interpretation:  Normal sinus rhythm Normal ECG No significant change  since last tracing Confirmed by Anders Hohmann MD, Tyeler Goedken [id number]) on 03/17/2015 12:04:05  AM      MDM   Final diagnoses:  Head injury, initial encounter  Concussion, without loss of consciousness, initial encounter    75 year old female who presents with dizziness and difficulty with concentration after repeated head trauma. On presentation she is well-appearing and in no acute distress. She is seen ambulating to the bathroom with steady gait, and has a normal neurological exam. A CT head is performed, showing no significant acute intracranial processes. I suspect that she may have mild concussion in the setting of repeated head injury. No neurological findings just presence of stroke or concern for other acute intracranial processes at this time. Discussed supportive care instructions for home. Discussed strict return instructions. She'll follow-up with her primary care doctor on Monday for reevaluation. She expressed understanding of all discharge instructions and felt comfortable with the plan of care.   Forde Dandy, MD 03/17/15 [phone]

## 2015-03-16 NOTE — Discharge Instructions (Signed)
Your CT head did not show bleeding or serious head injury since hitting her head. Please follow up closely with her primary care doctor for evaluation and further management of likely concussion. Return without fail for worsening symptoms including numbness or weakness, inability to walk, confusion, new speech or vision changes, or any other symptoms concerning to you.  Concussion, Adult A concussion, or closed-head injury, is a brain injury caused by a direct blow to the head or by a quick and sudden movement (jolt) of the head or neck. Concussions are usually not life-threatening. Even so, the effects of a concussion can be serious. If you have had a concussion before, you are more likely to experience concussion-like symptoms after a direct blow to the head.  CAUSES  Direct blow to the head, such as from running into another player during a soccer game, being hit in a fight, or hitting your head on a hard surface.  A jolt of the head or neck that causes the brain to move back and forth inside the skull, such as in a car crash. SIGNS AND SYMPTOMS The signs of a concussion can be hard to notice. Early on, they may be missed by you, family members, and health care providers. You may look fine but act or feel differently. Symptoms are usually temporary, but they may last for days, weeks, or even longer. Some symptoms may appear right away while others may not show up for hours or days. Every head injury is different. Symptoms include:  Mild to moderate headaches that will not go away.  A feeling of pressure inside your head.  Having more trouble than usual:  Learning or remembering things you have heard.  Answering questions.  Paying attention or concentrating.  Organizing daily tasks.  Making decisions and solving problems.  Slowness in thinking, acting or reacting, speaking, or reading.  Getting lost or being easily confused.  Feeling tired all the time or lacking energy  (fatigued).  Feeling drowsy.  Sleep disturbances.  Sleeping more than usual.  Sleeping less than usual.  Trouble falling asleep.  Trouble sleeping (insomnia).  Loss of balance or feeling lightheaded or dizzy.  Nausea or vomiting.  Numbness or tingling.  Increased sensitivity to:  Sounds.  Lights.  Distractions.  Vision problems or eyes that tire easily.  Diminished sense of taste or smell.  Ringing in the ears.  Mood changes such as feeling sad or anxious.  Becoming easily irritated or angry for little or no reason.  Lack of motivation.  Seeing or hearing things other people do not see or hear (hallucinations). DIAGNOSIS Your health care provider can usually diagnose a concussion based on a description of your injury and symptoms. He or she will ask whether you passed out (lost consciousness) and whether you are having trouble remembering events that happened right before and during your injury. Your evaluation might include:  A brain scan to look for signs of injury to the brain. Even if the test shows no injury, you may still have a concussion.  Blood tests to be sure other problems are not present. TREATMENT  Concussions are usually treated in an emergency department, in urgent care, or at a clinic. You may need to stay in the hospital overnight for further treatment.  Tell your health care provider if you are taking any medicines, including prescription medicines, over-the-counter medicines, and natural remedies. Some medicines, such as blood thinners (anticoagulants) and aspirin, may increase the chance of complications. Also tell your health  care provider whether you have had alcohol or are taking illegal drugs. This information may affect treatment.  Your health care provider will send you home with important instructions to follow.  How fast you will recover from a concussion depends on many factors. These factors include how severe your concussion is,  what part of your brain was injured, your age, and how healthy you were before the concussion.  Most people with mild injuries recover fully. Recovery can take time. In general, recovery is slower in older persons. Also, persons who have had a concussion in the past or have other medical problems may find that it takes longer to recover from their current injury. HOME CARE INSTRUCTIONS General Instructions  Carefully follow the directions your health care provider gave you.  Only take over-the-counter or prescription medicines for pain, discomfort, or fever as directed by your health care provider.  Take only those medicines that your health care provider has approved.  Do not drink alcohol until your health care provider says you are well enough to do so. Alcohol and certain other drugs may slow your recovery and can put you at risk of further injury.  If it is harder than usual to remember things, write them down.  If you are easily distracted, try to do one thing at a time. For example, do not try to watch TV while fixing dinner.  Talk with family members or close friends when making important decisions.  Keep all follow-up appointments. Repeated evaluation of your symptoms is recommended for your recovery.  Watch your symptoms and tell others to do the same. Complications sometimes occur after a concussion. Older adults with a brain injury may have a higher risk of serious complications, such as a blood clot on the brain.  Tell your teachers, school nurse, school counselor, coach, athletic trainer, or work Freight forwarder about your injury, symptoms, and restrictions. Tell them about what you can or cannot do. They should watch for:  Increased problems with attention or concentration.  Increased difficulty remembering or learning new information.  Increased time needed to complete tasks or assignments.  Increased irritability or decreased ability to cope with stress.  Increased  symptoms.  Rest. Rest helps the brain to heal. Make sure you:  Get plenty of sleep at night. Avoid staying up late at night.  Keep the same bedtime hours on weekends and weekdays.  Rest during the day. Take daytime naps or rest breaks when you feel tired.  Limit activities that require a lot of thought or concentration. These include:  Doing homework or job-related work.  Watching TV.  Working on the computer.  Avoid any situation where there is potential for another head injury (football, hockey, soccer, basketball, martial arts, downhill snow sports and horseback riding). Your condition will get worse every time you experience a concussion. You should avoid these activities until you are evaluated by the appropriate follow-up health care providers. Returning To Your Regular Activities You will need to return to your normal activities slowly, not all at once. You must give your body and brain enough time for recovery.  Do not return to sports or other athletic activities until your health care provider tells you it is safe to do so.  Ask your health care provider when you can drive, ride a bicycle, or operate heavy machinery. Your ability to react may be slower after a brain injury. Never do these activities if you are dizzy.  Ask your health care provider about when you  can return to work or school. Preventing Another Concussion It is very important to avoid another brain injury, especially before you have recovered. In rare cases, another injury can lead to permanent brain damage, brain swelling, or death. The risk of this is greatest during the first 7-10 days after a head injury. Avoid injuries by:  Wearing a seat belt when riding in a car.  Drinking alcohol only in moderation.  Wearing a helmet when biking, skiing, skateboarding, skating, or doing similar activities.  Avoiding activities that could lead to a second concussion, such as contact or recreational sports, until  your health care provider says it is okay.  Taking safety measures in your home.  Remove clutter and tripping hazards from floors and stairways.  Use grab bars in bathrooms and handrails by stairs.  Place non-slip mats on floors and in bathtubs.  Improve lighting in dim areas. SEEK MEDICAL CARE IF:  You have increased problems paying attention or concentrating.  You have increased difficulty remembering or learning new information.  You need more time to complete tasks or assignments than before.  You have increased irritability or decreased ability to cope with stress.  You have more symptoms than before. Seek medical care if you have any of the following symptoms for more than 2 weeks after your injury:  Lasting (chronic) headaches.  Dizziness or balance problems.  Nausea.  Vision problems.  Increased sensitivity to noise or light.  Depression or mood swings.  Anxiety or irritability.  Memory problems.  Difficulty concentrating or paying attention.  Sleep problems.  Feeling tired all the time. SEEK IMMEDIATE MEDICAL CARE IF:  You have severe or worsening headaches. These may be a sign of a blood clot in the brain.  You have weakness (even if only in one hand, leg, or part of the face).  You have numbness.  You have decreased coordination.  You vomit repeatedly.  You have increased sleepiness.  One pupil is larger than the other.  You have convulsions.  You have slurred speech.  You have increased confusion. This may be a sign of a blood clot in the brain.  You have increased restlessness, agitation, or irritability.  You are unable to recognize people or places.  You have neck pain.  It is difficult to wake you up.  You have unusual behavior changes.  You lose consciousness. MAKE SURE YOU:  Understand these instructions.  Will watch your condition.  Will get help right away if you are not doing well or get worse.   This  information is not intended to replace advice given to you by your health care provider. Make sure you discuss any questions you have with your health care provider.   Document Released: 05/03/2003 Document Revised: 03/03/2014 Document Reviewed: 09/02/2012 Elsevier Interactive Patient Education [employer].

## 2015-03-20 ENCOUNTER — Emergency Department (HOSPITAL_COMMUNITY): Payer: Medicare Other

## 2015-03-20 ENCOUNTER — Observation Stay (HOSPITAL_COMMUNITY)
Admission: EM | Admit: 2015-03-20 | Discharge: 2015-03-22 | Disposition: A | Discharge status: Home / Self Care | Payer: Medicare Other | Attending: Internal Medicine | Admitting: Internal Medicine

## 2015-03-20 ENCOUNTER — Encounter (HOSPITAL_COMMUNITY): Payer: Self-pay | Admitting: Emergency Medicine

## 2015-03-20 ENCOUNTER — Telehealth: Payer: Self-pay | Admitting: Family Medicine

## 2015-03-20 DIAGNOSIS — N390 Urinary tract infection, site not specified: Secondary | ICD-10-CM | POA: Diagnosis not present

## 2015-03-20 DIAGNOSIS — Z9114 Patient's other noncompliance with medication regimen: Secondary | ICD-10-CM | POA: Diagnosis not present

## 2015-03-20 DIAGNOSIS — R202 Paresthesia of skin: Secondary | ICD-10-CM | POA: Diagnosis present

## 2015-03-20 DIAGNOSIS — Z8673 Personal history of transient ischemic attack (TIA), and cerebral infarction without residual deficits: Secondary | ICD-10-CM | POA: Diagnosis not present

## 2015-03-20 DIAGNOSIS — M797 Fibromyalgia: Secondary | ICD-10-CM | POA: Diagnosis not present

## 2015-03-20 DIAGNOSIS — M1990 Unspecified osteoarthritis, unspecified site: Secondary | ICD-10-CM | POA: Insufficient documentation

## 2015-03-20 DIAGNOSIS — E7800 Pure hypercholesterolemia, unspecified: Secondary | ICD-10-CM | POA: Diagnosis present

## 2015-03-20 DIAGNOSIS — I10 Essential (primary) hypertension: Secondary | ICD-10-CM | POA: Diagnosis not present

## 2015-03-20 DIAGNOSIS — Z7982 Long term (current) use of aspirin: Secondary | ICD-10-CM | POA: Diagnosis not present

## 2015-03-20 DIAGNOSIS — K3184 Gastroparesis: Secondary | ICD-10-CM | POA: Insufficient documentation

## 2015-03-20 DIAGNOSIS — B9620 Unspecified Escherichia coli [E. coli] as the cause of diseases classified elsewhere: Secondary | ICD-10-CM | POA: Diagnosis not present

## 2015-03-20 DIAGNOSIS — E785 Hyperlipidemia, unspecified: Secondary | ICD-10-CM | POA: Insufficient documentation

## 2015-03-20 DIAGNOSIS — F32A Depression, unspecified: Secondary | ICD-10-CM | POA: Diagnosis present

## 2015-03-20 DIAGNOSIS — F329 Major depressive disorder, single episode, unspecified: Secondary | ICD-10-CM | POA: Diagnosis not present

## 2015-03-20 DIAGNOSIS — K589 Irritable bowel syndrome without diarrhea: Secondary | ICD-10-CM | POA: Diagnosis not present

## 2015-03-20 DIAGNOSIS — G459 Transient cerebral ischemic attack, unspecified: Secondary | ICD-10-CM | POA: Diagnosis not present

## 2015-03-20 DIAGNOSIS — Z79899 Other long term (current) drug therapy: Secondary | ICD-10-CM | POA: Insufficient documentation

## 2015-03-20 DIAGNOSIS — R200 Anesthesia of skin: Secondary | ICD-10-CM | POA: Diagnosis present

## 2015-03-20 DIAGNOSIS — G2581 Restless legs syndrome: Secondary | ICD-10-CM | POA: Diagnosis not present

## 2015-03-20 DIAGNOSIS — K222 Esophageal obstruction: Secondary | ICD-10-CM

## 2015-03-20 DIAGNOSIS — F419 Anxiety disorder, unspecified: Secondary | ICD-10-CM | POA: Insufficient documentation

## 2015-03-20 DIAGNOSIS — K219 Gastro-esophageal reflux disease without esophagitis: Secondary | ICD-10-CM | POA: Diagnosis present

## 2015-03-20 DIAGNOSIS — J45909 Unspecified asthma, uncomplicated: Secondary | ICD-10-CM | POA: Diagnosis not present

## 2015-03-20 LAB — CBC WITH DIFFERENTIAL/PLATELET
Basophils Absolute: 0 10*3/uL (ref 0.0–0.1)
Basophils Relative: 0 %
Eosinophils Absolute: 0.1 10*3/uL (ref 0.0–0.7)
Eosinophils Relative: 2 %
HCT: 38.9 % (ref 36.0–46.0)
Hemoglobin: 12.7 g/dL (ref 12.0–15.0)
Lymphocytes Relative: 25 %
Lymphs Abs: 1.4 10*3/uL (ref 0.7–4.0)
MCH: 29.7 pg (ref 26.0–34.0)
MCHC: 32.6 g/dL (ref 30.0–36.0)
MCV: 90.9 fL (ref 78.0–100.0)
Monocytes Absolute: 0.4 10*3/uL (ref 0.1–1.0)
Monocytes Relative: 7 %
Neutro Abs: 3.4 10*3/uL (ref 1.7–7.7)
Neutrophils Relative %: 66 %
Platelets: 155 10*3/uL (ref 150–400)
RBC: 4.28 MIL/uL (ref 3.87–5.11)
RDW: 13.1 % (ref 11.5–15.5)
WBC: 5.3 10*3/uL (ref 4.0–10.5)

## 2015-03-20 LAB — URINE MICROSCOPIC-ADD ON

## 2015-03-20 LAB — BASIC METABOLIC PANEL
Anion gap: 7 (ref 5–15)
BUN: 15 mg/dL (ref 6–20)
CO2: 28 mmol/L (ref 22–32)
Calcium: 9 mg/dL (ref 8.9–10.3)
Chloride: 106 mmol/L (ref 101–111)
Creatinine, Ser: 1.01 mg/dL — ABNORMAL HIGH (ref 0.44–1.00)
GFR calc Af Amer: >60 mL/min (ref >60)
GFR calc non Af Amer: 53 mL/min — ABNORMAL LOW (ref >60)
Glucose, Bld: 108 mg/dL — ABNORMAL HIGH (ref 65–99)
Potassium: 3.8 mmol/L (ref 3.5–5.1)
Sodium: 141 mmol/L (ref 135–145)

## 2015-03-20 LAB — URINALYSIS, ROUTINE W REFLEX MICROSCOPIC
Bilirubin Urine: NEGATIVE
Glucose, UA: NEGATIVE mg/dL
Ketones, ur: NEGATIVE mg/dL
Nitrite: NEGATIVE
Protein, ur: NEGATIVE mg/dL
Specific Gravity, Urine: 1.027 (ref 1.005–1.030)
pH: 5.5 (ref 5.0–8.0)

## 2015-03-20 MED ORDER — DIAZEPAM 5 MG/ML IJ SOLN: 5.0000 mg | Freq: Once | INTRAMUSCULAR | Status: DC

## 2015-03-20 MED ORDER — DIAZEPAM 5 MG PO TABS
5.0000 mg | ORAL_TABLET | Freq: Once | ORAL | Status: AC
  Administered 2015-03-20: 5 mg via ORAL
  Filled 2015-03-20: qty 1

## 2015-03-20 NOTE — ED Notes (Signed)
Patient to room, placed in gown and on monitor.

## 2015-03-20 NOTE — Telephone Encounter (Signed)
Pt and daughter called in stating they wanted to schedule f/u with Dr. [name] from [place] 03/16/15. Daughter states results of CT were given to them immediately following. Scheduled for 03/21/15. Pt daughter then stated pt had new symptom today of numbness in left hand. Transferred call to [facility] with [employer].

## 2015-03-20 NOTE — Telephone Encounter (Signed)
Called to follow up with patient.  Pt says that she did not go to ER.  Pt states she is no longer having numbness in left hand, but has a "tingling sensation" on the upper portion of the back of her head, feels groggy and sleepy, ear pressure and has poor balance--left side of body feels weaker (but this is not new, started 2 weeks ago).  She denied vision changes, slurred speech, facial drooping, headaches, n/v, confusion, convulsions,or memory loss.   She asked that I speak with her daughter Santiago Glad, who lives in [place].  Spoke to Santiago Glad who says that she is really worried about her mom given prior TIA's and family member recently having a "major stroke."  She says that she has a friend coming to pick mom up and take her to the ER.  She says that she would like a MRI done and possibly have patient be observed overnight.  She asked that office call [employer] to alert them that patient is coming and make them aware of her wishes to have MRI and overnight stay.  Called and spoke to Lewis, Therapist, sports and made her aware of the situation and that patient is on her way.

## 2015-03-20 NOTE — Consult Note (Signed)
Consult Reason for Consult: numbness Referring Physician: Dr Wilson Singer  CC: numbness  HPI: Paige Pena is an 75 y.o. female hx of TIA, HLD presenting with left hand numbness. Today noted recurrent episodes of left hand weakness and numbness. Symptoms came and went though she notes continued numbness on the left upper extremity. She reports that 2 weeks ago she hit her head and suffered a concussion. Since then she has been noting some paresthesias on the left side though the current symptoms are different.    Past Medical History  Diagnosis Date  . Dysphonia   . Asthmatic bronchitis   . Chest pain, atypical   . Cerebrovascular disease   . Hypercholesteremia   . GERD (gastroesophageal reflux disease)   . Acute cystitis   . Esophageal stricture   . Gastroparesis   . Diverticulosis of colon   . IBS (irritable bowel syndrome)   . Hemorrhoids   . Urinary incontinence   . Hematuria, microscopic   . DJD (degenerative joint disease)   . Fibromyalgia   . Anxiety   . Vitamin D deficiency   . RLS (restless legs syndrome)   . Aphasia     transsient aphasia Jan 2012  . Memory loss   . Memory deficits 02/15/2013  . TIA (transient ischemic attack)     Past Surgical History  Procedure Laterality Date  . Abdominal hysterectomy  1995  . Corneal transplant for keratonconus    . Right bunion surgery    . Left foot fracture with orif  07/2004    Dr. Sharol Given  . Eye surgery  2013    Family History  Problem Relation Age of Onset  . Breast cancer      aunt  . Diabetes      aunt,brother,uncle  . Kidney disease      father  . Hyperlipidemia Sister   . Diabetes Brother   . Hypertension Mother   . Anemia Mother   . Kidney disease Father   . Dementia Maternal Grandmother     Social History:  reports that she has never smoked. She has never used smokeless tobacco. She reports that she does not drink alcohol or use illicit drugs.  Allergies  Allergen Reactions  . Morphine Other (See  Comments)    REACTION: lethargy/malaise  . Metoclopramide Hcl Anxiety and Other (See Comments)    REACTION: anxious and out of her mind  . Penicillins Itching and Rash    Has patient had a PCN reaction causing immediate rash, facial/tongue/throat swelling, SOB or lightheadedness with hypotension: No Has patient had a PCN reaction causing severe rash involving mucus membranes or skin necrosis: No Has patient had a PCN reaction that required hospitalization No Has patient had a PCN reaction occurring within the last 10 years: Yes If all of the above answers are "NO", then may proceed with Cephalosporin use.    . Prednisone Itching and Rash  . Sulfonamide Derivatives Itching and Rash    Medications: I have reviewed the patient's current medications.   ROS: Out of a complete 14 system review, the patient complains of only the following symptoms, and all other reviewed systems are negative. +weakness, numbness  Physical Examination: Filed Vitals:   03/20/15 2100 03/20/15 2115  BP: 139/78 151/80  Pulse: 70 71  Temp:    Resp: 25 11   Physical Exam  Constitutional: He appears well-developed and well-nourished.  Psych: Affect appropriate to situation Eyes: No scleral injection HENT: No OP obstrucion Head: Normocephalic.  Cardiovascular: Normal rate and regular rhythm.  Respiratory: Effort normal and breath sounds normal.  GI: Soft. Bowel sounds are normal. No distension. There is no tenderness.  Skin: WDI  Neurologic Examination Mental Status: Alert, oriented, thought content appropriate.  Speech fluent without evidence of aphasia.  Able to follow 3 step commands without difficulty. Cranial Nerves: II: funduscopic exam wnl bilaterally, visual fields grossly normal, pupils equal, round, reactive to light and accommodation III,IV, VI: ptosis not present, extra-ocular motions intact bilaterally V,VII: smile symmetric, facial light touch sensation normal bilaterally VIII:  hearing normal bilaterally IX,X: gag reflex present XI: trapezius strength/neck flexion strength normal bilaterally XII: tongue strength normal  Motor: Right : Upper extremity    Left:     Upper extremity 5/5 deltoid       5/5 deltoid 5/5 biceps      5/5 biceps  5/5 triceps      5/5 triceps 5/5 hand grip      5/5 hand grip  Lower extremity     Lower extremity 5/5 hip flexor      5/5 hip flexor 5/5 quadricep      5/5 quadriceps  5/5 hamstrings     5/5 hamstrings 5/5 plantar flexion       5/5 plantar flexion 5/5 plantar extension     5/5 plantar extension Tone and bulk:normal tone throughout; no atrophy noted Sensory: Pinprick and light touch diminished on LUE and LLE Deep Tendon Reflexes: 2+ and symmetric throughout Plantars: Right: downgoing   Left: downgoing Cerebellar: normal finger-to-nose, and normal heel-to-shin test Gait: deferred  Laboratory Studies:   Basic Metabolic Panel:  Recent Labs Lab 03/16/15 1821 03/20/15 1937  NA 141 141  K 3.9 3.8  CL 106 106  CO2 27 28  GLUCOSE 95 108*  BUN 12 15  CREATININE 0.91 1.01*  CALCIUM 9.1 9.0    Liver Function Tests:  Recent Labs Lab 03/16/15 1821  AST 23  ALT 15  ALKPHOS 85  BILITOT 0.5  PROT 6.7  ALBUMIN 3.7   No results for input(s): LIPASE, AMYLASE in the last 168 hours. No results for input(s): AMMONIA in the last 168 hours.  CBC:  Recent Labs Lab 03/16/15 1821 03/20/15 1937  WBC 4.3 5.3  NEUTROABS 2.0 3.4  HGB 12.8 12.7  HCT 39.0 38.9  MCV 90.3 90.9  PLT 146* 155    Cardiac Enzymes: No results for input(s): CKTOTAL, CKMB, CKMBINDEX, TROPONINI in the last 168 hours.  BNP: Invalid input(s): POCBNP  CBG: No results for input(s): GLUCAP in the last 168 hours.  Microbiology: Results for orders placed or performed during the hospital encounter of 01/29/11  Urine culture     Status: None   Collection Time: 01/29/11  2:47 AM  Result Value Ref Range Status   Specimen Description URINE,  CATHETERIZED  Final   Special Requests ADDED AT 0551  Final   Culture  Setup Time [id number]  Final   Colony Count 70,000 COLONIES/ML  Final   Culture   Final    Multiple bacterial morphotypes present, none predominant. Suggest appropriate recollection if clinically indicated.   Report Status 01/30/2011 FINAL  Final    Coagulation Studies: No results for input(s): LABPROT, INR in the last 72 hours.  Urinalysis:  Recent Labs Lab 03/20/15 1941  COLORURINE YELLOW  LABSPEC 1.027  PHURINE 5.5  GLUCOSEU NEGATIVE  HGBUR MODERATE*  BILIRUBINUR NEGATIVE  KETONESUR NEGATIVE  PROTEINUR NEGATIVE  NITRITE NEGATIVE  LEUKOCYTESUR SMALL*    Lipid Panel:  Component Value Date/Time   CHOL 168 01/04/2015 1406   TRIG 69 01/04/2015 1406   HDL 82 01/04/2015 1406   CHOLHDL 2.0 01/04/2015 1406   VLDL 14 01/04/2015 1406   LDLCALC 72 01/04/2015 1406    HgbA1C:  Lab Results  Component Value Date   HGBA1C * 03/08/2010    5.7 (NOTE)                                                                       According to the ADA Clinical Practice Recommendations for 2011, when HbA1c is used as a screening test:   >=6.5%   Diagnostic of Diabetes Mellitus           (if abnormal result  is confirmed)  5.7-6.4%   Increased risk of developing Diabetes Mellitus  References:Diagnosis and Classification of Diabetes Mellitus,Diabetes [id number] 1):S62-S69 and Standards of Medical Care in         Diabetes - 2011,Diabetes Care,2011,34  (Suppl 1):S11-S61.    Urine Drug Screen:  No results found for: LABOPIA, COCAINSCRNUR, LABBENZ, AMPHETMU, THCU, LABBARB  Alcohol Level: No results for input(s): ETH in the last 168 hours.  Other results:  Imaging: Mr Brain Wo Contrast  03/20/2015  CLINICAL DATA:  Initial evaluation for intermittent left hand tingling. EXAM: MRI HEAD WITHOUT CONTRAST TECHNIQUE: Multiplanar, multiecho pulse sequences of the brain and surrounding structures were obtained without  intravenous contrast. COMPARISON:  Prior CT from 03/16/2015. FINDINGS: Cerebral volume within normal limits for patient age. Patchy T2/FLAIR hyperintensity within the periventricular and deep white matter both cerebral hemispheres most consistent with chronic small vessel ischemic disease, mild in nature. Scattered remote infarcts present within the bilateral cerebellar hemispheres. No foci of restricted diffusion to suggest acute intracranial infarct. Gray-white matter differentiation maintained. Normal intravascular flow voids preserved. No acute or chronic intracranial hemorrhage. No mass lesion, midline shift, or mass effect. No hydrocephalus. No extra-axial fluid collection. Craniocervical junction within normal limits. Multilevel degenerative spondylolysis noted within the visualized upper cervical spine. Pituitary gland within normal limits. No acute abnormality about the orbits. Paranasal sinuses are clear. Trace opacity within the right mastoid air cells. Left mastoid air cells clear. Inner ear structures grossly normal. Bone marrow signal intensity within normal limits. No scalp soft tissue abnormality. IMPRESSION: 1. No acute intracranial infarct or other process identified. 2. Mild chronic small vessel ischemic disease with multiple scattered remote cerebellar infarcts bilaterally. Electronically Signed   By: Jeannine Boga M.D.   On: 03/20/2015 23:23     Assessment/Plan:  74y/o woman with hx of TIA, HLD presenting with recurrent episodes of LUE numbness and weakness today. Exam pertinent for subjective sensory deficits on left-side (both upper and lower extremity). History and exam atypical but cannot rule out TIA/CVA.   1. HgbA1c, fasting lipid panel 2. MRI, MRA  of the brain without contrast 3. Frequent neuro checks 4. Echocardiogram 5. Carotid dopplers 6. Prophylactic therapy-ASA 325mg  daily 7. Risk factor modification 8. Telemetry monitoring 9. PT consult, OT consult, Speech  consult 10. NPO until RN stroke swallow screen   Jim Like, DO [facility] [phone]  If 7pm- 7am, please page neurology on call as listed in Kevin. 03/20/2015, 11:51 PM

## 2015-03-20 NOTE — ED Notes (Signed)
Pt sts that today around 1pm she started to having intermittent tingling in L hand lasting a few mins each time. Also sts tingling in back L side of head. Not currently having tingling in hand. Seen here Friday for concussion sx. Pt also reporting that she has been fatigued. No blood thinners.

## 2015-03-20 NOTE — ED Provider Notes (Signed)
CSN: [id number]     Arrival date & time 03/20/15  1837 History   First MD Initiated Contact with Patient 03/20/15 1948     Chief Complaint  Patient presents with  . Numbness     (Consider location/radiation/quality/duration/timing/severity/associated sxs/prior Treatment) HPI   75 year old female with past medical history of hyperlipidemia, previous TIA, anxiety, who is coming in today with complaints of left-sided hand numbness. Patient hit her head when standing up 2 weeks ago on the underside of a cabinetshe had no LOC, she sought medical care and had a CT of her head which was unremarkable. Ever since then she's been having feelings of paresthesias in her left leg, no neck pain, today she had an approximate 5 min episode where she felt like her entire left hand was numb, this has since resolved.  Past Medical History  Diagnosis Date  . Dysphonia   . Asthmatic bronchitis   . Chest pain, atypical   . Cerebrovascular disease   . Hypercholesteremia   . GERD (gastroesophageal reflux disease)   . Acute cystitis   . Esophageal stricture   . Gastroparesis   . Diverticulosis of colon   . IBS (irritable bowel syndrome)   . Hemorrhoids   . Urinary incontinence   . Hematuria, microscopic   . DJD (degenerative joint disease)   . Fibromyalgia   . Anxiety   . Vitamin D deficiency   . RLS (restless legs syndrome)   . Aphasia     transsient aphasia Jan 2012  . Memory loss   . Memory deficits 02/15/2013  . TIA (transient ischemic attack)    Past Surgical History  Procedure Laterality Date  . Abdominal hysterectomy  1995  . Corneal transplant for keratonconus    . Right bunion surgery    . Left foot fracture with orif  07/2004    Dr. Sharol Given  . Eye surgery  2013   Family History  Problem Relation Age of Onset  . Breast cancer      aunt  . Diabetes      aunt,brother,uncle  . Kidney disease      father  . Hyperlipidemia Sister   . Diabetes Brother   . Hypertension Mother   .  Anemia Mother   . Kidney disease Father   . Dementia Maternal Grandmother    Social History  Substance Use Topics  . Smoking status: Never Smoker   . Smokeless tobacco: Never Used  . Alcohol Use: No   OB History    No data available     Review of Systems  Constitutional: Negative for fever and chills.  HENT: Negative for nosebleeds.   Eyes: Negative for visual disturbance.  Respiratory: Negative for cough and shortness of breath.   Cardiovascular: Negative for chest pain.  Gastrointestinal: Negative for nausea, vomiting, abdominal pain, diarrhea and constipation.  Genitourinary: Negative for dysuria.  Skin: Negative for rash.  Neurological: Positive for numbness. Negative for weakness.  All other systems reviewed and are negative.     Allergies  Morphine; Metoclopramide hcl; Penicillins; Prednisone; and Sulfonamide derivatives  Home Medications   Prior to Admission medications   Medication Sig Start Date End Date Taking? Authorizing Provider  ALPRAZolam (XANAX) 0.5 MG tablet TAKE  1/2 -1 TABLET BY MOUTH THREE TIMES DAILY AS NEEDED Patient taking differently: Take 0.5 mg by mouth 3 (three) times daily as needed for anxiety. TAKE  1/2 -1 TABLET BY MOUTH THREE TIMES DAILY AS NEEDED 05/23/13  Yes [name], MD  aspirin (ECOTRIN) 325 MG EC tablet Take 325 mg by mouth daily.     Yes Historical Provider, MD  azelastine (ASTELIN) 0.1 % nasal spray Place 2 sprays into both nostrils at bedtime as needed for rhinitis. Use in each nostril as directed 10/13/14  Yes Colon Branch, MD  DULoxetine (CYMBALTA) 20 MG capsule TAKE ONE CAPSULE BY MOUTH DAILY 12/20/14  Yes [name], MD  fluticasone [facility]) 50 MCG/ACT nasal spray Place 2 sprays into both nostrils daily. Patient taking differently: Place 2 sprays into both nostrils daily as needed for allergies.  10/05/14  Yes [name], MD  Iron-Vitamins (GERITOL) LIQD Take 15 mLs by mouth daily.   Yes Historical Provider, MD   KRILL OIL OMEGA-3 PO Take 1 capsule by mouth daily.   Yes Historical Provider, MD  loratadine (CLARITIN) 10 MG tablet Take 1 tablet (10 mg total) by mouth daily. Patient taking differently: Take 10 mg by mouth daily as needed for allergies.  10/05/14  Yes [name], MD  omeprazole (PRILOSEC) 40 MG capsule TAKE ONE CAPSULE BY MOUTH DAILY 10/05/14  Yes [name], MD  OVER THE COUNTER MEDICATION Take 1 tablet by mouth 3 (three) times daily. Pt takes domperidone that she gets mail order from [place]   Yes Historical Provider, MD  OVER THE COUNTER MEDICATION Take 1 tablet by mouth at bedtime as needed (sleep). Sonipure for sleep.   Yes Historical Provider, MD  prednisoLONE acetate (PRED FORTE) 1 % ophthalmic suspension Place 1 drop into both eyes daily.  12/22/12  Yes Historical Provider, MD  ranitidine (ZANTAC) 300 MG tablet Take 1 tablet (300 mg total) by mouth at bedtime. Patient taking differently: Take 300 mg by mouth daily as needed for heartburn.  02/15/15  Yes [name], MD  rosuvastatin (CRESTOR) 5 MG tablet Take 1 tablet by mouth three days each week. Patient taking differently: Take 5 mg by mouth 2 (two) times a week. Tuesday and Saturday 06/20/14  Yes Josue Hector, MD  tolterodine (DETROL LA) 4 MG 24 hr capsule Take 1 capsule (4 mg total) by mouth at bedtime. 10/05/14  Yes [name], MD  dicyclomine (BENTYL) 20 MG tablet Take 1 tablet (20 mg total) by mouth 4 (four) times daily -  before meals and at bedtime. Patient not taking: Reported on 03/20/2015 02/27/15   [name], MD  ondansetron (ZOFRAN) 4 MG tablet Take 1 tablet (4 mg total) by mouth every 8 (eight) hours as needed for nausea or vomiting. Patient not taking: Reported on 03/20/2015 02/15/15   [name], MD  sucralfate (CARAFATE) 1 G tablet Take 1 tablet (1 g total) by mouth 4 (four) times daily -  with meals and at bedtime. Patient not taking: Reported on 03/20/2015 02/15/15    [name], MD   BP 151/80 mmHg  Pulse 71  Temp(Src) 98.2 F (36.8 C) (Oral)  Resp 11  Ht 5\' 1"  (1.549 m)  Wt 59.875 kg  BMI 24.95 kg/m2  SpO2 99% Physical Exam  Constitutional: She is oriented to person, place, and time. No distress.  HENT:  Head: Normocephalic and atraumatic.  Eyes: EOM are normal. Pupils are equal, round, and reactive to light.  Neck: Normal range of motion. Neck supple.  Cardiovascular: Normal rate and intact distal pulses.   Pulmonary/Chest: No respiratory distress.  Abdominal: Soft. There is no tenderness.  Musculoskeletal: Normal range of motion.  Neurological: She is alert and oriented to  person, place, and time.  Subjective decreased sensation to the L arm and leg, mildly reduced from the right.  Skin: No rash noted. She is not diaphoretic.  Psychiatric: She has a normal mood and affect.    ED Course  Procedures (including critical care time) Labs Review Labs Reviewed  BASIC METABOLIC PANEL - Abnormal; Notable for the following:    Glucose, Bld 108 (*)    Creatinine, Ser 1.01 (*)    GFR calc non Af Amer 53 (*)    All other components within normal limits  URINALYSIS, ROUTINE W REFLEX MICROSCOPIC (NOT AT [facility]) - Abnormal; Notable for the following:    Hgb urine dipstick MODERATE (*)    Leukocytes, UA SMALL (*)    All other components within normal limits  URINE MICROSCOPIC-ADD ON - Abnormal; Notable for the following:    Squamous Epithelial / LPF 0-5 (*)    Bacteria, UA RARE (*)    Casts HYALINE CASTS (*)    Crystals CA OXALATE CRYSTALS (*)    All other components within normal limits  URINE CULTURE  CBC WITH DIFFERENTIAL/PLATELET  HEMOGLOBIN A1C  LIPID PANEL  PROTIME-INR  APTT    Imaging Review Mr Brain Wo Contrast  03/20/2015  CLINICAL DATA:  Initial evaluation for intermittent left hand tingling. EXAM: MRI HEAD WITHOUT CONTRAST TECHNIQUE: Multiplanar, multiecho pulse sequences of the brain and surrounding structures were  obtained without intravenous contrast. COMPARISON:  Prior CT from 03/16/2015. FINDINGS: Cerebral volume within normal limits for patient age. Patchy T2/FLAIR hyperintensity within the periventricular and deep white matter both cerebral hemispheres most consistent with chronic small vessel ischemic disease, mild in nature. Scattered remote infarcts present within the bilateral cerebellar hemispheres. No foci of restricted diffusion to suggest acute intracranial infarct. Gray-white matter differentiation maintained. Normal intravascular flow voids preserved. No acute or chronic intracranial hemorrhage. No mass lesion, midline shift, or mass effect. No hydrocephalus. No extra-axial fluid collection. Craniocervical junction within normal limits. Multilevel degenerative spondylolysis noted within the visualized upper cervical spine. Pituitary gland within normal limits. No acute abnormality about the orbits. Paranasal sinuses are clear. Trace opacity within the right mastoid air cells. Left mastoid air cells clear. Inner ear structures grossly normal. Bone marrow signal intensity within normal limits. No scalp soft tissue abnormality. IMPRESSION: 1. No acute intracranial infarct or other process identified. 2. Mild chronic small vessel ischemic disease with multiple scattered remote cerebellar infarcts bilaterally. Electronically Signed   By: Jeannine Boga M.D.   On: 03/20/2015 23:23   I have personally reviewed and evaluated these images and lab results as part of my medical decision-making.   EKG Interpretation None      MDM   Final diagnoses:  TIA (transient ischemic attack)  TIA (transient ischemic attack)    75 year old female with past medical history of hyperlipidemia, previous TIA, anxiety, who is coming in today with complaints of left-sided hand numbness.  Exam as above.  Concern for possible TIA. No weakness in the arms or legs or neck pain, I doubt cord compression  Have obtained  labs include CBC CMP obtained in unremarkable, EKG unremarkable, CT head the other day unremarkable. We'll obtain MR brain and speak with neurology.  Neuro has seen pt and recommend admission for tia obs    Jarome Matin, MD 03/21/15 0110  Virgel Manifold, MD 03/28/15 1013

## 2015-03-20 NOTE — Telephone Encounter (Signed)
[facility] - Client TELEPHONE ADVICE RECORD [facility]  Patient Name: Paige Pena  DOB: 10/07/1940    Initial Comment Caller states having numbness in her left hand. Had a head injury on Friday, went to the ER.   Nurse Assessment  Nurse: Doyle Askew, RN, Joelene Millin Date/Time Eilene Ghazi Time): 03/20/2015 3:01:39 PM  Confirm and document reason for call. If symptomatic, describe symptoms. You must click the next button to save text entered. ---Caller states having numbness in her left hand. Had a head injury last week (hit head on kitchen cabinet door) and went on Friday, went to the ER. Pt is waiting for husband at a facility while he is having a procedure done. Started about 2 hours ago. Also hit head on door in bathroom on Jan. 1st. Pt isn't having problems with memory. When washing her face this morning, left hand became very numb. Not as numb now but still present. has had "stinging, prickling sensation on left side of the head. This is the side of the head that she hit but not location.  Has the patient traveled out of the country within the last 30 days? ---Not Applicable  Does the patient have any new or worsening symptoms? ---Yes  Will a triage be completed? ---Yes  Related visit to physician within the last 2 weeks? ---No  Does the PT have any chronic conditions? (i.e. diabetes, asthma, etc.) ---Yes  List chronic conditions. ---fibromyalgia  Is this a behavioral health or substance abuse call? ---No     Guidelines    Guideline Title Affirmed Question Affirmed Notes  Neurologic Deficit Headache (and neurologic deficit)    Final Disposition User   Go to ED Now Doyle Askew, RN, Joelene Millin    Referrals  GO TO FACILITY UNDECIDED  [facility] - ED   Disagree/Comply: Comply

## 2015-03-21 ENCOUNTER — Encounter (HOSPITAL_COMMUNITY): Payer: Self-pay | Admitting: General Practice

## 2015-03-21 ENCOUNTER — Inpatient Hospital Stay (HOSPITAL_BASED_OUTPATIENT_CLINIC_OR_DEPARTMENT_OTHER): Payer: Medicare Other

## 2015-03-21 ENCOUNTER — Ambulatory Visit: Payer: Self-pay | Admitting: Family Medicine

## 2015-03-21 ENCOUNTER — Telehealth: Payer: Self-pay | Admitting: Family Medicine

## 2015-03-21 ENCOUNTER — Inpatient Hospital Stay (HOSPITAL_COMMUNITY): Payer: Medicare Other

## 2015-03-21 DIAGNOSIS — K589 Irritable bowel syndrome without diarrhea: Secondary | ICD-10-CM | POA: Diagnosis not present

## 2015-03-21 DIAGNOSIS — K219 Gastro-esophageal reflux disease without esophagitis: Secondary | ICD-10-CM

## 2015-03-21 DIAGNOSIS — R200 Anesthesia of skin: Secondary | ICD-10-CM | POA: Diagnosis present

## 2015-03-21 DIAGNOSIS — N390 Urinary tract infection, site not specified: Secondary | ICD-10-CM

## 2015-03-21 DIAGNOSIS — F418 Other specified anxiety disorders: Secondary | ICD-10-CM

## 2015-03-21 DIAGNOSIS — G459 Transient cerebral ischemic attack, unspecified: Secondary | ICD-10-CM | POA: Diagnosis present

## 2015-03-21 DIAGNOSIS — R202 Paresthesia of skin: Secondary | ICD-10-CM | POA: Diagnosis present

## 2015-03-21 DIAGNOSIS — E7800 Pure hypercholesterolemia, unspecified: Secondary | ICD-10-CM

## 2015-03-21 LAB — LIPID PANEL
CHOL/HDL RATIO: 2.8 ratio
CHOLESTEROL: 205 mg/dL — AB (ref 0–200)
HDL: 73 mg/dL (ref >40)
LDL Cholesterol: 117 mg/dL — ABNORMAL HIGH (ref 0–99)
TRIGLYCERIDES: 75 mg/dL (ref <150)
VLDL: 15 mg/dL (ref 0–40)

## 2015-03-21 LAB — APTT: aPTT: 29 seconds (ref 24–37)

## 2015-03-21 LAB — PROTIME-INR
INR: 1.14 (ref 0.00–1.49)
PROTHROMBIN TIME: 14.8 s (ref 11.6–15.2)

## 2015-03-21 MED ORDER — ROSUVASTATIN CALCIUM 5 MG PO TABS
5.0000 mg | ORAL_TABLET | Freq: Every day | ORAL | Status: DC
  Administered 2015-03-22: 5 mg via ORAL
  Filled 2015-03-21 (×2): qty 1

## 2015-03-21 MED ORDER — FLUTICASONE PROPIONATE 50 MCG/ACT NA SUSP
2.0000 | Freq: Every day | NASAL | Status: DC | PRN
  Filled 2015-03-21: qty 16

## 2015-03-21 MED ORDER — ASPIRIN 325 MG PO TABS
325.0000 mg | ORAL_TABLET | Freq: Every day | ORAL | Status: DC
  Administered 2015-03-21 – 2015-03-22 (×2): 325 mg via ORAL
  Filled 2015-03-21 (×2): qty 1

## 2015-03-21 MED ORDER — ELDERTONIC PO ELIX
15.0000 mL | ORAL_SOLUTION | Freq: Every day | ORAL | Status: DC
  Administered 2015-03-21 – 2015-03-22 (×2): 15 mL via ORAL
  Filled 2015-03-21 (×2): qty 15

## 2015-03-21 MED ORDER — ENOXAPARIN SODIUM 40 MG/0.4ML ~~LOC~~ SOLN
40.0000 mg | SUBCUTANEOUS | Status: DC
  Administered 2015-03-22: 40 mg via SUBCUTANEOUS
  Filled 2015-03-21: qty 0.4

## 2015-03-21 MED ORDER — CIPROFLOXACIN IN D5W 400 MG/200ML IV SOLN
400.0000 mg | Freq: Once | INTRAVENOUS | Status: AC
  Administered 2015-03-21: 400 mg via INTRAVENOUS
  Filled 2015-03-21: qty 200

## 2015-03-21 MED ORDER — FAMOTIDINE 20 MG PO TABS
20.0000 mg | ORAL_TABLET | Freq: Two times a day (BID) | ORAL | Status: DC
  Administered 2015-03-21 – 2015-03-22 (×4): 20 mg via ORAL
  Filled 2015-03-21 (×5): qty 1

## 2015-03-21 MED ORDER — GERITOL TONIC PO LIQD: 15.0000 mL | Freq: Every day | ORAL | Status: DC

## 2015-03-21 MED ORDER — PANTOPRAZOLE SODIUM 40 MG PO TBEC
40.0000 mg | DELAYED_RELEASE_TABLET | Freq: Every day | ORAL | Status: DC
Start: 2015-03-21 — End: 2015-03-22
  Administered 2015-03-21 – 2015-03-22 (×2): 40 mg via ORAL
  Filled 2015-03-21 (×2): qty 1

## 2015-03-21 MED ORDER — CIPROFLOXACIN IN D5W 200 MG/100ML IV SOLN
200.0000 mg | Freq: Two times a day (BID) | INTRAVENOUS | Status: DC
  Administered 2015-03-21 – 2015-03-22 (×2): 200 mg via INTRAVENOUS
  Filled 2015-03-21 (×5): qty 100

## 2015-03-21 MED ORDER — ROSUVASTATIN CALCIUM 5 MG PO TABS: 5.0000 mg | ORAL_TABLET | ORAL | Status: DC

## 2015-03-21 MED ORDER — ENOXAPARIN SODIUM 30 MG/0.3ML ~~LOC~~ SOLN
30.0000 mg | SUBCUTANEOUS | Status: DC
  Administered 2015-03-21: 30 mg via SUBCUTANEOUS
  Filled 2015-03-21: qty 0.3

## 2015-03-21 MED ORDER — SODIUM CHLORIDE 0.9 % IV SOLN
INTRAVENOUS | Status: DC
  Administered 2015-03-21 (×2): via INTRAVENOUS

## 2015-03-21 MED ORDER — LORATADINE 10 MG PO TABS: 10.0000 mg | ORAL_TABLET | Freq: Every day | ORAL | Status: DC | PRN

## 2015-03-21 MED ORDER — RANITIDINE HCL 150 MG/10ML PO SYRP
150.0000 mg | ORAL_SOLUTION | Freq: Two times a day (BID) | ORAL | Status: DC | PRN
  Filled 2015-03-21: qty 10

## 2015-03-21 MED ORDER — AZELASTINE HCL 0.1 % NA SOLN
2.0000 | Freq: Every evening | NASAL | Status: DC | PRN
  Filled 2015-03-21: qty 30

## 2015-03-21 MED ORDER — STROKE: EARLY STAGES OF RECOVERY BOOK
Freq: Once | Status: AC
Start: 2015-03-21 — End: 2015-03-21
  Administered 2015-03-21: 12:00:00
  Filled 2015-03-21: qty 1

## 2015-03-21 MED ORDER — SENNOSIDES-DOCUSATE SODIUM 8.6-50 MG PO TABS: 1.0000 | ORAL_TABLET | Freq: Every evening | ORAL | Status: DC | PRN

## 2015-03-21 MED ORDER — DULOXETINE HCL 20 MG PO CPEP
20.0000 mg | ORAL_CAPSULE | Freq: Every day | ORAL | Status: DC
  Administered 2015-03-21 – 2015-03-22 (×2): 20 mg via ORAL
  Filled 2015-03-21 (×2): qty 1

## 2015-03-21 MED ORDER — OMEGA-3-ACID ETHYL ESTERS 1 G PO CAPS
1.0000 | ORAL_CAPSULE | Freq: Every day | ORAL | Status: DC
  Administered 2015-03-21 – 2015-03-22 (×2): 1 g via ORAL
  Filled 2015-03-21 (×2): qty 1

## 2015-03-21 MED ORDER — ASPIRIN 300 MG RE SUPP: 300.0000 mg | Freq: Every day | RECTAL | Status: DC

## 2015-03-21 MED ORDER — FESOTERODINE FUMARATE ER 4 MG PO TB24
4.0000 mg | ORAL_TABLET | Freq: Every day | ORAL | Status: DC
  Administered 2015-03-21 – 2015-03-22 (×2): 4 mg via ORAL
  Filled 2015-03-21 (×2): qty 1

## 2015-03-21 MED ORDER — ALPRAZOLAM 0.5 MG PO TABS
0.5000 mg | ORAL_TABLET | Freq: Three times a day (TID) | ORAL | Status: DC | PRN
Start: 2015-03-21 — End: 2015-03-22

## 2015-03-21 MED ORDER — PREDNISOLONE ACETATE 1 % OP SUSP
1.0000 [drp] | Freq: Every day | OPHTHALMIC | Status: DC
  Administered 2015-03-22: 1 [drp] via OPHTHALMIC
  Filled 2015-03-21 (×3): qty 1

## 2015-03-21 NOTE — Telephone Encounter (Signed)
[facility] [place] called at 12:23am stating mother was admitted to [place] [facility].

## 2015-03-21 NOTE — ED Notes (Signed)
Please call patient's daughter if any problems arise. [place] [phone]

## 2015-03-21 NOTE — Progress Notes (Signed)
PT ARRIVED TO UNIT A/O X4.

## 2015-03-21 NOTE — Progress Notes (Signed)
Patient requested something for heartburn. Attending provider paged. Awaiting for call back and orders.   Ave Filter, RN

## 2015-03-21 NOTE — Telephone Encounter (Signed)
Paient being seen by Dr. [name] today.

## 2015-03-21 NOTE — Telephone Encounter (Signed)
Noted. No charge. 

## 2015-03-21 NOTE — H&P (Signed)
[facility] History and Physical  Paige Pena [id number] DOB: [birth year] DOA: 03/20/2015  Referring physician: ED physician PCP: Annye Asa, MD  Specialists:   Chief Complaint: left hand tingling and numbness  HPI: Paige Pena is a 75 y.o. female with PMH of TIA, CVA, gastroparesis, IBS, diverticulosis, R LS, depression, anxiety, hyperlipidemia, who presents with left hand tingling and numbness.  Patient reports that she has left hand tingling and numbness since 1:00 PM. It has been intermittent, it lasted for a few minutes each time, then resolved spontaneously. She has mild weakness in left arm. No vision changes, hearing loss. Patient does not have chest pain, shortness of breath, abdominal pain, diarrhea. She has mild burning on urination and increased urinary frequency, but no dysuria. Of note, pt reports that she had head injury with concussion 2 weeks ago.  In ED, patient was found to have postive urinalysis with a small marked leukocytosis, WBC 5.3, temperature normal, no tachycardia, electrolytes okay. MRI of brain showed no acute abnormalities, but showed chronic mild small cerebellar infarct. Patient is admitted to inpatient for further evaluation and treatment. Neurology was consulted.  EKG: Independently reviewed. QTC 436, no ischemic change.  Where does patient live?   At home    Can patient participate in ADLs?  Some   Review of Systems:   General: no fevers, chills, no changes in body weight,  Pulm: no dyspnea, coughing, wheezing CV: no chest pain, palpitations Abd: no nausea, vomiting, abdominal pain, diarrhea, constipation GU: no dysuria, has burning on urination, increased urinary frequency, no hematuria  Ext: no leg edema Neuro: has left hand numbness and tingling, no vision change or hearing loss Skin: no rash MSK: No muscle spasm, no deformity, no limitation of range of movement in spin Heme: No easy bruising.  Travel history: No recent  long distant travel.  Allergy:  Allergies  Allergen Reactions  . Morphine Other (See Comments)    REACTION: lethargy/malaise  . Metoclopramide Hcl Anxiety and Other (See Comments)    REACTION: anxious and out of her mind  . Penicillins Itching and Rash    Has patient had a PCN reaction causing immediate rash, facial/tongue/throat swelling, SOB or lightheadedness with hypotension: No Has patient had a PCN reaction causing severe rash involving mucus membranes or skin necrosis: No Has patient had a PCN reaction that required hospitalization No Has patient had a PCN reaction occurring within the last 10 years: Yes If all of the above answers are "NO", then may proceed with Cephalosporin use.    . Prednisone Itching and Rash  . Sulfonamide Derivatives Itching and Rash    Past Medical History  Diagnosis Date  . Dysphonia   . Asthmatic bronchitis   . Chest pain, atypical   . Cerebrovascular disease   . Hypercholesteremia   . GERD (gastroesophageal reflux disease)   . Acute cystitis   . Esophageal stricture   . Gastroparesis   . Diverticulosis of colon   . IBS (irritable bowel syndrome)   . Hemorrhoids   . Urinary incontinence   . Hematuria, microscopic   . DJD (degenerative joint disease)   . Fibromyalgia   . Anxiety   . Vitamin D deficiency   . RLS (restless legs syndrome)   . Aphasia     transsient aphasia Jan 2012  . Memory loss   . Memory deficits 02/15/2013  . TIA (transient ischemic attack)     Past Surgical History  Procedure Laterality Date  . Abdominal hysterectomy  1995  . Corneal transplant for keratonconus    . Right bunion surgery    . Left foot fracture with orif  07/2004    Dr. Sharol Given  . Eye surgery  2013    Social History:  reports that she has never smoked. She has never used smokeless tobacco. She reports that she does not drink alcohol or use illicit drugs.  Family History:  Family History  Problem Relation Age of Onset  . Breast cancer       aunt  . Diabetes      aunt,brother,uncle  . Kidney disease      father  . Hyperlipidemia Sister   . Diabetes Brother   . Hypertension Mother   . Anemia Mother   . Kidney disease Father   . Dementia Maternal Grandmother      Prior to Admission medications   Medication Sig Start Date End Date Taking? Authorizing Provider  ALPRAZolam (XANAX) 0.5 MG tablet TAKE  1/2 -1 TABLET BY MOUTH THREE TIMES DAILY AS NEEDED Patient taking differently: Take 0.5 mg by mouth 3 (three) times daily as needed for anxiety. TAKE  1/2 -1 TABLET BY MOUTH THREE TIMES DAILY AS NEEDED 05/23/13  Yes [name], MD  aspirin (ECOTRIN) 325 MG EC tablet Take 325 mg by mouth daily.     Yes Historical Provider, MD  azelastine (ASTELIN) 0.1 % nasal spray Place 2 sprays into both nostrils at bedtime as needed for rhinitis. Use in each nostril as directed 10/13/14  Yes Colon Branch, MD  DULoxetine (CYMBALTA) 20 MG capsule TAKE ONE CAPSULE BY MOUTH DAILY 12/20/14  Yes [name], MD  fluticasone [facility]) 50 MCG/ACT nasal spray Place 2 sprays into both nostrils daily. Patient taking differently: Place 2 sprays into both nostrils daily as needed for allergies.  10/05/14  Yes [name], MD  Iron-Vitamins (GERITOL) LIQD Take 15 mLs by mouth daily.   Yes Historical Provider, MD  KRILL OIL OMEGA-3 PO Take 1 capsule by mouth daily.   Yes Historical Provider, MD  loratadine (CLARITIN) 10 MG tablet Take 1 tablet (10 mg total) by mouth daily. Patient taking differently: Take 10 mg by mouth daily as needed for allergies.  10/05/14  Yes [name], MD  omeprazole (PRILOSEC) 40 MG capsule TAKE ONE CAPSULE BY MOUTH DAILY 10/05/14  Yes [name], MD  OVER THE COUNTER MEDICATION Take 1 tablet by mouth 3 (three) times daily. Pt takes domperidone that she gets mail order from [place]   Yes Historical Provider, MD  OVER THE COUNTER MEDICATION Take 1 tablet by mouth at bedtime as needed (sleep). Sonipure for sleep.    Yes Historical Provider, MD  prednisoLONE acetate (PRED FORTE) 1 % ophthalmic suspension Place 1 drop into both eyes daily.  12/22/12  Yes Historical Provider, MD  ranitidine (ZANTAC) 300 MG tablet Take 1 tablet (300 mg total) by mouth at bedtime. Patient taking differently: Take 300 mg by mouth daily as needed for heartburn.  02/15/15  Yes [name], MD  rosuvastatin (CRESTOR) 5 MG tablet Take 1 tablet by mouth three days each week. Patient taking differently: Take 5 mg by mouth 2 (two) times a week. Tuesday and Saturday 06/20/14  Yes Josue Hector, MD  tolterodine (DETROL LA) 4 MG 24 hr capsule Take 1 capsule (4 mg total) by mouth at bedtime. 10/05/14  Yes [name], MD  dicyclomine (BENTYL) 20 MG tablet Take 1 tablet (20 mg total) by  mouth 4 (four) times daily -  before meals and at bedtime. Patient not taking: Reported on 03/20/2015 02/27/15   [name], MD  ondansetron (ZOFRAN) 4 MG tablet Take 1 tablet (4 mg total) by mouth every 8 (eight) hours as needed for nausea or vomiting. Patient not taking: Reported on 03/20/2015 02/15/15   [name], MD  sucralfate (CARAFATE) 1 G tablet Take 1 tablet (1 g total) by mouth 4 (four) times daily -  with meals and at bedtime. Patient not taking: Reported on 03/20/2015 02/15/15   [name], MD    Physical Exam: Filed Vitals:   03/20/15 1909 03/20/15 2045 03/20/15 2100 03/20/15 2115  BP: 129/71 138/90 139/78 151/80  Pulse: 72  70 71  Temp: 98.2 F (36.8 C)     TempSrc: Oral     Resp: 18 14 25 11   Height: 5\' 1"  (1.549 m)     Weight: 59.875 kg (132 lb)     SpO2: 99%  96% 99%   General: Not in acute distress HEENT:       Eyes: PERRL, EOMI, no scleral icterus.       ENT: No discharge from the ears and nose, no pharynx injection, no tonsillar enlargement.        Neck: No JVD, no bruit, no mass felt. Heme: No neck lymph node enlargement. Cardiac: S1/S2, RRR, No murmurs, No gallops or rubs. Pulm: No rales,  wheezing, rhonchi or rubs. Abd: Soft, nondistended, nontender, no rebound pain, no organomegaly, BS present. Ext: No pitting leg edema bilaterally. 2+DP/PT pulse bilaterally. Musculoskeletal: No joint deformities, No joint redness or warmth, no limitation of ROM in spin. Skin: No rashes.  Neuro: Alert, oriented X3, cranial nerves II-XII grossly intact, muscle strength 5/5 in all extremities, slightly decreased sensation to light touch in left hand and forearm. Brachial reflex 2+ bilaterally. Negative Babinski's sign. Normal finger to nose test. Psych: Patient is not psychotic, no suicidal or hemocidal ideation.  Labs on Admission:  Basic Metabolic Panel:  Recent Labs Lab 03/16/15 1821 03/20/15 1937  NA 141 141  K 3.9 3.8  CL 106 106  CO2 27 28  GLUCOSE 95 108*  BUN 12 15  CREATININE 0.91 1.01*  CALCIUM 9.1 9.0   Liver Function Tests:  Recent Labs Lab 03/16/15 1821  AST 23  ALT 15  ALKPHOS 85  BILITOT 0.5  PROT 6.7  ALBUMIN 3.7   No results for input(s): LIPASE, AMYLASE in the last 168 hours. No results for input(s): AMMONIA in the last 168 hours. CBC:  Recent Labs Lab 03/16/15 1821 03/20/15 1937  WBC 4.3 5.3  NEUTROABS 2.0 3.4  HGB 12.8 12.7  HCT 39.0 38.9  MCV 90.3 90.9  PLT 146* 155   Cardiac Enzymes: No results for input(s): CKTOTAL, CKMB, CKMBINDEX, TROPONINI in the last 168 hours.  BNP (last 3 results) No results for input(s): BNP in the last 8760 hours.  ProBNP (last 3 results) No results for input(s): PROBNP in the last 8760 hours.  CBG: No results for input(s): GLUCAP in the last 168 hours.  Radiological Exams on Admission: Mr Brain Wo Contrast  03/20/2015  CLINICAL DATA:  Initial evaluation for intermittent left hand tingling. EXAM: MRI HEAD WITHOUT CONTRAST TECHNIQUE: Multiplanar, multiecho pulse sequences of the brain and surrounding structures were obtained without intravenous contrast. COMPARISON:  Prior CT from 03/16/2015. FINDINGS:  Cerebral volume within normal limits for patient age. Patchy T2/FLAIR hyperintensity within the periventricular and deep white matter  both cerebral hemispheres most consistent with chronic small vessel ischemic disease, mild in nature. Scattered remote infarcts present within the bilateral cerebellar hemispheres. No foci of restricted diffusion to suggest acute intracranial infarct. Gray-white matter differentiation maintained. Normal intravascular flow voids preserved. No acute or chronic intracranial hemorrhage. No mass lesion, midline shift, or mass effect. No hydrocephalus. No extra-axial fluid collection. Craniocervical junction within normal limits. Multilevel degenerative spondylolysis noted within the visualized upper cervical spine. Pituitary gland within normal limits. No acute abnormality about the orbits. Paranasal sinuses are clear. Trace opacity within the right mastoid air cells. Left mastoid air cells clear. Inner ear structures grossly normal. Bone marrow signal intensity within normal limits. No scalp soft tissue abnormality. IMPRESSION: 1. No acute intracranial infarct or other process identified. 2. Mild chronic small vessel ischemic disease with multiple scattered remote cerebellar infarcts bilaterally. Electronically Signed   By: Jeannine Boga M.D.   On: 03/20/2015 23:23    Assessment/Plan Principal Problem:   Numbness and tingling in left hand Active Problems:   HYPERCHOLESTEROLEMIA   ESOPHAGEAL STRICTURE   GERD   Gastroparesis   Irritable bowel syndrome   Anxiety and depression   TIA (transient ischemic attack)   UTI (lower urinary tract infection)   Numbness and tingling in left hand: it is concerning for TIA. Her MRI has no acute issues. Neurology was consulted. - will admit to tele bed - Risk factor modification: HgbA1c, fasting lipid panel  - MRA of the brain without contrast  - PT consult, OT consult, Speech consult  - 2 d Echocardiogram  - Ekg  - Carotid  dopplers  - Aspirin  - follow-up urologist recommendation  HLD: Last LDL was 72 on 01/04/15 -Continue home medications: Crestor  GERD: -Pepcide and protonix  Depression and anxiety: Stable, no suicidal or homicidal ideations. -Continue home medications: Xanax. Cymbalta.  Irritable bowel syndrome: stable -on Bentyl  Possible UTI (lower urinary tract infection): patient has mild burning on urination and increased urinary frequency. Her urinalysis is positive for UTI with small amount of leukocytes. -Ciprofloxacin -Follow-up urine culture   DVT ppx:  SQ Lovenox  Code Status: Full code Family Communication: None at bed side.  Disposition Plan: Admit to inpatient   Date of Service 03/21/2015    Ivor Costa [facility] Pager [phone]  If 7PM-7AM, please contact night-coverage [email or web address] Password [facility] 03/21/2015, 12:48 AM

## 2015-03-21 NOTE — Progress Notes (Signed)
[facility] PROGRESS NOTE  Paige Pena [id number] DOB: May 16, 1940 DOA: 03/20/2015 PCP: Annye Asa, MD  Brief Summary  The patient is a 75 year old female with history of recurrent TIA, stroke, gastroparesis, IBS, diverticulosis, depression, anxiety, hyperlipidemia who presented with left hand tingling and numbness. She had been seen approximately 4 days prior to admission in the emergency department because she had had several falls with head trauma and had had persistent off-balance sensation since that time. She had a CT head which demonstrated no acute abnormalities and was told that she possibly had a concussion and to start brain rest. She presented to the emergency department after she noticed acute onset of numbness of the left hand and a little bit on her left face which lasted for a couple of minutes and resolved. She had stuttering recurrence of the symptoms while in the emergency department. In the emergency department, her urinalysis was concerning for urinary tract infection although she was asymptomatic, MRI of the brain showed no acute abnormality. She was seen by neurology who was concerned for TIA. She was admitted for further evaluation and treatment. Per family, she has been noncompliant with her aspirin and takes her rosuvastatin only intermittently.  Assessment/Plan  Numbness and tingling in left hand: it is concerning for TIA. Her MRI has no acute issues. Neurology was consulted. - Telemetry: Normal sinus rhythm - Risk factor modification: HgbA1c pending, fasting lipid panel with elevated LDL -  Increase rosuvastatin -  Encourage daily use of aspirin - MRA of the brain without contrast demonstrated no evidence of stenosis - PT consult, OT consult, Speech consult pending - 2 d Echocardiogram pending - Ekg normal sinus rhythm - Carotid dopplers 1-39% stenosis bilaterally with antegrade vertebral artery flow - follow-up with Dr. Jannifer Franklin as an  outpatient  Elevated blood pressure reading -  Monitor blood pressures -  Start when necessary hydralazine for SBP greater than 180    HLD: Last LDL was 72 on 01/04/15, however it is 117 today - Increase to daily Crestor  GERD: -Pepcide and protonix  Depression and anxiety: Stable, no suicidal or homicidal ideations. -Continue home medications: Xanax. Cymbalta.  Irritable bowel syndrome: stable -on Bentyl  Possible UTI (lower urinary tract infection): patient has mild burning on urination and increased urinary frequency. Her urinalysis is positive for UTI with small amount of leukocytes. -Ciprofloxacin -Follow-up urine culture which is pending  Diet:  Low-sodium Access:  PIV IVF:  Off Proph:  Lovenox  ode Status: Fullily Communication: Patient and her daughter from [place] by phoneion Plan: Likely home tomorrow after echocardiogram complete    Consultants: Neurologyres: CT head MRI brain MRA brain Carotid and vertebral artery duplex  Echocardiogram pending ics:  Ciprofloxacin 1/25    HPI/Subjective:  States that she continues to have a little bit of left face numbness or decreased sensation, left hand numbness, and left foot numbness. She denies weakness. She feels like her coordination on the left side may not be as good as normal. She had some decreased sensation of her left ankle for the last several years after an ankle injury with surgery, but it feels different than usual. She denies confusion or slurred speech. She denies facial droop and states she was brushing her teeth this morning while looking in the mirror when her symptoms first occurred and she did not notice any difference.   Objective: Filed Vitals:   03/21/15 0925 03/21/15 1148 03/21/15 1309 03/21/15 1530  BP: 122/63 152/67 147/79 148/86  Pulse: 71 67  85 76  Temp: 98.1 F (36.7 C) 97.6 F (36.4 C) 97.4 F (36.3 C)   TempSrc: Oral  Oral   Resp: 18 18 18 18   Height:      Weight:      SpO2: 99%  99% 100% 100%    Intake/Output Summary (Last 24 hours) at 03/21/15 1627 Last data filed at 03/21/15 1330  Gross per 24 hour  Intake    700 ml  Output      0 ml  Net    700 ml   Filed Weights   03/20/15 1909  Weight: 59.875 kg (132 lb)   Body mass index is 24.95 kg/(m^2).  Exam:   General:  adult female, No acute distress  HEENT:  NCAT, MMM  Cardiovascular:  RRR, nl S1, S2 no mrg, 2+ pulses, warm extremities  Respiratory:  CTAB, no increased WOB  Abdomen:   NABS, soft, NT/ND  MSK:   Normal tone and bulk, no LEE NeuroCranial nerves II through XII grossly intact except for possible decreased sensation to light touch of the left for head left cheek and left jaw, mild decreased sensation to light touch of the left arm and left leg. Mild dysmetria of the left arm and left leg.  Strength 5 out of 5 throughout   ata Reviewed: Basic Metabolic Panel:  Recent Labs Lab 03/16/15 1821 03/20/15 1937  NA 141 141  K 3.9 3.8  CL 106 106  CO2 27 28  GLUCOSE 95 108*  BUN 12 15  CREATININE 0.91 1.01*  CALCIUM 9.1 9.0   Liver Function Tests:  Recent Labs Lab 03/16/15 1821  AST 23  ALT 15  ALKPHOS 85  BILITOT 0.5  PROT 6.7  ALBUMIN 3.7   No results for input(s): LIPASE, AMYLASE in the last 168 hours. No results for input(s): AMMONIA in the last 168 hours. CBC:  Recent Labs Lab 03/16/15 1821 03/20/15 1937  WBC 4.3 5.3  NEUTROABS 2.0 3.4  HGB 12.8 12.7  HCT 39.0 38.9  MCV 90.3 90.9  PLT 146* 155    No results found for this or any previous visit (from the past 240 hour(s)).   Studies: Mr Herby Abraham Contrast  03/21/2015  ADDENDUM REPORT: 03/21/2015 02:11 ADDENDUM: Upon further review, there is mild cerebellar tonsillar ectopia of approximately 4 mm at the craniocervical junction. Overall, the appearance of this is similar relative to previous MRI from 2012. Electronically Signed   By: Jeannine Boga M.D.   On: 03/21/2015 02:11  03/21/2015  CLINICAL DATA:   Initial evaluation for intermittent left hand tingling. EXAM: MRI HEAD WITHOUT CONTRAST TECHNIQUE: Multiplanar, multiecho pulse sequences of the brain and surrounding structures were obtained without intravenous contrast. COMPARISON:  Prior CT from 03/16/2015. FINDINGS: Cerebral volume within normal limits for patient age. Patchy T2/FLAIR hyperintensity within the periventricular and deep white matter both cerebral hemispheres most consistent with chronic small vessel ischemic disease, mild in nature. Scattered remote infarcts present within the bilateral cerebellar hemispheres. No foci of restricted diffusion to suggest acute intracranial infarct. Gray-white matter differentiation maintained. Normal intravascular flow voids preserved. No acute or chronic intracranial hemorrhage. No mass lesion, midline shift, or mass effect. No hydrocephalus. No extra-axial fluid collection. Craniocervical junction within normal limits. Multilevel degenerative spondylolysis noted within the visualized upper cervical spine. Pituitary gland within normal limits. No acute abnormality about the orbits. Paranasal sinuses are clear. Trace opacity within the right mastoid air cells. Left mastoid air cells clear. Inner ear structures grossly normal.  Bone marrow signal intensity within normal limits. No scalp soft tissue abnormality. IMPRESSION: 1. No acute intracranial infarct or other process identified. 2. Mild chronic small vessel ischemic disease with multiple scattered remote cerebellar infarcts bilaterally. Electronically Signed: By: Jeannine Boga M.D. On: 03/20/2015 23:23   Mr Jodene Nam Head/brain Wo Cm  03/21/2015  CLINICAL DATA:  Initial evaluation for numbness and tingling in left hand. EXAM: MRA HEAD WITHOUT CONTRAST TECHNIQUE: Angiographic images of the Circle of Willis were obtained using MRA technique without intravenous contrast. COMPARISON:  Prior study from 03/08/2010. FINDINGS: ANTERIOR CIRCULATION: Distal cervical  segments of the internal carotid arteries are widely patent with antegrade flow. Left ICA is larger than the right. Petrous segments widely patent. Dolichoectasia of the intracranial cavernous and supraclinoid ICAs which are widely patent. Left A1 segment supplies the anterior cerebral arteries, accounting for the size difference in the internal carotid arteries. Anterior communicating artery normal. Anterior cerebral arteries widely patent. M1 segments patent without stenosis or occlusion. MCA bifurcations within normal limits. Distal MCA branches symmetric and well opacified. POSTERIOR CIRCULATION: Vertebral arteries patent to the vertebrobasilar junction. The left vertebral artery is dominant. The diminutive right vertebral artery divides at the takeoff of the low right posterior inferior cerebral artery, with a small hypoplastic branch ascending towards the vertebrobasilar junction. Basilar arteries somewhat diminutive in tortuous but widely patent. Superior cerebellar arteries well opacified. P1 segments someone hypoplastic, particularly on the right. Widely patent prominent posterior communicating arteries present bilaterally. PCAs well opacified to their distal aspects. No aneurysm. IMPRESSION: 1. No large or proximal arterial branch occlusion. No hemodynamically significant or correctable stenosis. 2. Dolichoectasia of the internal carotid arteries, more prominent left. Finding is stable relative to previous MRA. Electronically Signed   By: Jeannine Boga M.D.   On: 03/21/2015 02:00    Scheduled Meds: . aspirin  300 mg Rectal Daily   Or  . aspirin  325 mg Oral Daily  . ciprofloxacin  200 mg Intravenous Q12H  . DULoxetine  20 mg Oral Daily  . enoxaparin (LOVENOX) injection  30 mg Subcutaneous Q24H  . famotidine  20 mg Oral BID  . fesoterodine  4 mg Oral Daily  . geriatric multivitamins-minerals  15 mL Oral Daily  . omega-3 acid ethyl esters  1 capsule Oral Daily  . pantoprazole  40 mg Oral  Daily  . prednisoLONE acetate  1 drop Both Eyes Daily  . [START ON 03/24/2015] rosuvastatin  5 mg Oral Once per day on Tue Sat   Continuous Infusions:   Principal Problem:   Numbness and tingling in left hand Active Problems:   HYPERCHOLESTEROLEMIA   ESOPHAGEAL STRICTURE   GERD   Gastroparesis   Irritable bowel syndrome   Anxiety and depression   TIA (transient ischemic attack)   UTI (lower urinary tract infection)    Time spent: 30 min    Tavyn Kurka, [facility] Pager [phone]. If 7PM-7AM, please contact night-coverage at [email or web address], password [facility] 03/21/2015, 4:27 PM  LOS: 0 days

## 2015-03-21 NOTE — Progress Notes (Signed)
STROKE TEAM PROGRESS NOTE   HISTORY OF PRESENT ILLNESS Paige Pena is an 75 y.o. female hx of TIA, HLD presenting with left hand numbness. Today 03/20/2015 noted recurrent episodes of left hand weakness and numbness. Symptoms came and went though she notes continued numbness on the left upper extremity. She reports that 2 weeks ago she hit her head and suffered a concussion. Since then she has been noting some paresthesias on the left side though the current symptoms are different. Patient was not administered TPA. She was admitted for further evaluation and treatment.   SUBJECTIVE (INTERVAL HISTORY) Her husband is at the bedside.  Overall she feels her condition is completely resolved. Reports a history of a recent concussion from hitting the left side of her head on the bathroom counter. The following week she hit her head in the same spot on her kitchen cabinet. The crawling sensation comes and goes.    OBJECTIVE Temp:  [97.4 F (36.3 C)-98.5 F (36.9 C)] 97.4 F (36.3 C) (01/25 1309) Pulse Rate:  [58-85] 85 (01/25 1309) Cardiac Rhythm:  [-]  Resp:  [11-25] 18 (01/25 1309) BP: (112-154)/(63-92) 147/79 mmHg (01/25 1309) SpO2:  [94 %-100 %] 100 % (01/25 1309) Weight:  [59.875 kg (132 lb)] 59.875 kg (132 lb) (01/24 1909)  CBC:   Recent Labs Lab 03/16/15 1821 03/20/15 1937  WBC 4.3 5.3  NEUTROABS 2.0 3.4  HGB 12.8 12.7  HCT 39.0 38.9  MCV 90.3 90.9  PLT 146* [id number]    Basic Metabolic Panel:   Recent Labs Lab 03/16/15 1821 03/20/15 1937  NA 141 141  K 3.9 3.8  CL 106 106  CO2 27 28  GLUCOSE 95 108*  BUN 12 15  CREATININE 0.91 1.01*  CALCIUM 9.1 9.0    Lipid Panel:     Component Value Date/Time   CHOL 205* 03/21/2015 0145   TRIG 75 03/21/2015 0145   HDL 73 03/21/2015 0145   CHOLHDL 2.8 03/21/2015 0145   VLDL 15 03/21/2015 0145   LDLCALC 117* 03/21/2015 0145   HgbA1c:  Lab Results  Component Value Date   HGBA1C * 03/08/2010    5.7 (NOTE)                                                                        According to the ADA Clinical Practice Recommendations for 2011, when HbA1c is used as a screening test:   >=6.5%   Diagnostic of Diabetes Mellitus           (if abnormal result  is confirmed)  5.7-6.4%   Increased risk of developing Diabetes Mellitus  References:Diagnosis and Classification of Diabetes Mellitus,Diabetes [id number] 1):S62-S69 and Standards of Medical Care in         Diabetes - 2011,Diabetes Care,2011,34  (Suppl 1):S11-S61.   Urine Drug Screen: No results found for: LABOPIA, COCAINSCRNUR, LABBENZ, AMPHETMU, THCU, LABBARB    IMAGING  Mr Brain Wo Contrast 03/21/2015  1. No acute intracranial infarct or other process identified. 2. Mild chronic small vessel ischemic disease with multiple scattered remote cerebellar infarcts bilaterally. Upon further review, there is mild cerebellar tonsillar ectopia of approximately 4 mm at the craniocervical junction. Overall, the appearance of this is similar relative to previous  MRI from 2012.   Mr Jodene Nam Head/brain Wo Cm 03/21/2015   1. No large or proximal arterial branch occlusion. No hemodynamically significant or correctable stenosis. 2. Dolichoectasia of the internal carotid arteries, more prominent left. Finding is stable relative to previous MRA.   Carotid Doppler   There is 1-39% bilateral ICA stenosis. Vertebral artery flow is antegrade.     PHYSICAL EXAM Pleasant elderly African-American lady currently not in distress. . Afebrile. Head is nontraumatic. Neck is supple without bruit.    Cardiac exam no murmur or gallop. Lungs are clear to auscultation. Distal pulses are well felt. Neurological Exam ;  Awake  Alert oriented x 3. Normal speech and language.eye movements full without nystagmus.fundi were not visualized. Vision acuity and fields appear normal. Hearing is normal. Palatal movements are normal. Face symmetric. Tongue midline. Normal strength, tone, reflexes and  coordination. Normal sensation. Gait deferred. ASSESSMENT/PLAN Paige Pena is a 75 y.o. female with history of TIA and HLD presenting with recurrent LUE numbness and weakness. She did not receive IV t-PA.   R brain TIA  Resultant  Tingling in her left hand  MRI  No acute stroke  MRA  No large vessel stenosis  Carotid Doppler  No significant stenosis   2D Echo  pending   LDL 117  HgbA1c pending  Lovenox 30 mg sq daily for VTE prophylaxis Diet Heart Room service appropriate?: Yes; Fluid consistency:: Thin  aspirin 325 mg daily prior to admission (was ordered, but she admits to not taking it routinely "due to black spot on my leg"), put back  aspirin 325 mg daily. Continue aspirin at discharge.  Patient counseled to be compliant with her antithrombotic medications  Ongoing aggressive stroke risk factor management  Therapy recommendations:  pending   Disposition:  Return home  [facility] for discharge once workup completed  Follow up with Dr. Jannifer Franklin. Order written.  Hyperlipidemia  Home meds:  crestor 5, fish oil, resumed in hospital  LDL 117, goal < 70  Consider increasing crestor dose  Continue statin at discharge  Other Stroke Risk Factors  Advanced age  Hx TIA  Other Active Problems  Baseline cognitive deficits, followed by Dr. Jannifer Franklin  Fibromyalgia  GERD  Depression and anxiety  IBS  Possible UTI  Hospital day # 0  Radene Journey [facility] for Pager information 03/21/2015 1:40 PM  I have personally examined this patient, reviewed notes, independently viewed imaging studies, participated in medical decision making and plan of care. I have made any additions or clarifications directly to the above note. Agree with note above. She presented with transient left hand weakness and numbness likely due to right MCA branch TIA and remains at risk for neurological worsening, recurrent stroke, TIA needs ongoing stroke evaluation and  aggressive risk factor modification. Continue aspirin and stroke workup . Discussed with patient and husband and answered questions Antony Contras, MD Medical Director [place] Pager: [phone] 03/21/2015 4:17 PM    To contact Stroke Continuity provider, please refer to [email or web address] After hours, contact General Neurology

## 2015-03-21 NOTE — Progress Notes (Signed)
*  PRELIMINARY RESULTS* Vascular Ultrasound Carotid Duplex (Doppler) has been completed.   Findings suggest 1-39% internal carotid artery stenosis bilaterally. Vertebral arteries are patent with antegrade flow.  03/21/2015 11:26 AM Maudry Mayhew, RVT, RDCS, RDMS

## 2015-03-22 ENCOUNTER — Ambulatory Visit (HOSPITAL_COMMUNITY): Payer: Medicare Other

## 2015-03-22 DIAGNOSIS — G459 Transient cerebral ischemic attack, unspecified: Secondary | ICD-10-CM | POA: Diagnosis not present

## 2015-03-22 DIAGNOSIS — N390 Urinary tract infection, site not specified: Secondary | ICD-10-CM | POA: Diagnosis not present

## 2015-03-22 DIAGNOSIS — R202 Paresthesia of skin: Secondary | ICD-10-CM | POA: Diagnosis not present

## 2015-03-22 DIAGNOSIS — F418 Other specified anxiety disorders: Secondary | ICD-10-CM | POA: Diagnosis not present

## 2015-03-22 LAB — HEMOGLOBIN A1C
Hgb A1c MFr Bld: 5.6 % (ref 4.8–5.6)
Mean Plasma Glucose: 114 mg/dL

## 2015-03-22 MED ORDER — CIPROFLOXACIN HCL 500 MG PO TABS: 500.0000 mg | ORAL_TABLET | Freq: Every day | ORAL | Status: DC

## 2015-03-22 NOTE — Discharge Summary (Signed)
Physician Discharge Summary  Paige Pena MRN: [id number] DOB/AGE: [birth year] 75 y.o.  PCP: Annye Asa, MD   Admit date: 03/20/2015 Discharge date: 03/22/2015  Discharge Diagnoses:   Principal Problem:   Numbness and tingling in left hand Active Problems:   HYPERCHOLESTEROLEMIA   ESOPHAGEAL STRICTURE   GERD   Gastroparesis   Irritable bowel syndrome   Anxiety and depression   TIA (transient ischemic attack)   UTI (lower urinary tract infection)    Follow-up recommendations Follow-up with PCP in 3-5 days , including all  additional recommended appointments as below Follow-up CBC, CMP in 3-5 days Follow up with neurology in 2-3 weeks  Echocardiogram   Results pending at the time of DC     Medication List    TAKE these medications        ALPRAZolam 0.5 MG tablet  Commonly known as:  XANAX  TAKE  1/2 -1 TABLET BY MOUTH THREE TIMES DAILY AS NEEDED     azelastine 0.1 % nasal spray  Commonly known as:  ASTELIN  Place 2 sprays into both nostrils at bedtime as needed for rhinitis. Use in each nostril as directed     dicyclomine 20 MG tablet  Commonly known as:  BENTYL  Take 1 tablet (20 mg total) by mouth 4 (four) times daily -  before meals and at bedtime.     DULoxetine 20 MG capsule  Commonly known as:  CYMBALTA  TAKE ONE CAPSULE BY MOUTH DAILY     ECOTRIN 325 MG EC tablet  Generic drug:  aspirin  Take 325 mg by mouth daily.     fluticasone 50 MCG/ACT nasal spray  Commonly known as:  FLONASE  Place 2 sprays into both nostrils daily.     Geritol Liqd  Take 15 mLs by mouth daily.     KRILL OIL OMEGA-3 PO  Take 1 capsule by mouth daily.     loratadine 10 MG tablet  Commonly known as:  CLARITIN  Take 1 tablet (10 mg total) by mouth daily.     omeprazole 40 MG capsule  Commonly known as:  PRILOSEC  TAKE ONE CAPSULE BY MOUTH DAILY     ondansetron 4 MG tablet  Commonly known as:  ZOFRAN  Take 1 tablet (4 mg total) by mouth every 8 (eight) hours as  needed for nausea or vomiting.     OVER THE COUNTER MEDICATION  Take 1 tablet by mouth at bedtime as needed (sleep). Sonipure for sleep.     OVER THE COUNTER MEDICATION  Take 1 tablet by mouth 3 (three) times daily. Pt takes domperidone that she gets mail order from [place]     prednisoLONE acetate 1 % ophthalmic suspension  Commonly known as:  PRED FORTE  Place 1 drop into both eyes daily.     ranitidine 300 MG tablet  Commonly known as:  ZANTAC  Take 1 tablet (300 mg total) by mouth at bedtime.     rosuvastatin 5 MG tablet  Commonly known as:  CRESTOR  Take 1 tablet by mouth three days each week.     sucralfate 1 g tablet  Commonly known as:  CARAFATE  Take 1 tablet (1 g total) by mouth 4 (four) times daily -  with meals and at bedtime.     tolterodine 4 MG 24 hr capsule  Commonly known as:  DETROL LA  Take 1 capsule (4 mg total) by mouth at bedtime.         Discharge  Condition: * Stable  Discharge Instructions       Discharge Instructions    Ambulatory referral to Neurology    Complete by:  As directed   An appointment is requested in approximately: 4 weeks - 8 weeks. Pt w/ tia, seen in the hospital by sethi. Has seen willis in past so needs follow up with him.     Diet - low sodium heart healthy    Complete by:  As directed      Increase activity slowly    Complete by:  As directed                Disposition: 01-Home or Self Care   Consults:  neurology   Significant Diagnostic Studies:  Ct Head Wo Contrast  03/16/2015  CLINICAL DATA:  Recent head injury with dizziness, initial encounter EXAM: CT HEAD WITHOUT CONTRAST TECHNIQUE: Contiguous axial images were obtained from the base of the skull through the vertex without intravenous contrast. COMPARISON:  01/29/2011 FINDINGS: Bony calvarium is intact. No findings to suggest acute hemorrhage, acute infarction or space-occupying mass lesion are noted. IMPRESSION: No acute intracranial abnormality noted.  Electronically Signed   By: Inez Catalina M.D.   On: 03/16/2015 19:49   Mr Brain Wo Contrast  03/21/2015  ADDENDUM REPORT: 03/21/2015 02:11 ADDENDUM: Upon further review, there is mild cerebellar tonsillar ectopia of approximately 4 mm at the craniocervical junction. Overall, the appearance of this is similar relative to previous MRI from 2012. Electronically Signed   By: Jeannine Boga M.D.   On: 03/21/2015 02:11  03/21/2015  CLINICAL DATA:  Initial evaluation for intermittent left hand tingling. EXAM: MRI HEAD WITHOUT CONTRAST TECHNIQUE: Multiplanar, multiecho pulse sequences of the brain and surrounding structures were obtained without intravenous contrast. COMPARISON:  Prior CT from 03/16/2015. FINDINGS: Cerebral volume within normal limits for patient age. Patchy T2/FLAIR hyperintensity within the periventricular and deep white matter both cerebral hemispheres most consistent with chronic small vessel ischemic disease, mild in nature. Scattered remote infarcts present within the bilateral cerebellar hemispheres. No foci of restricted diffusion to suggest acute intracranial infarct. Gray-white matter differentiation maintained. Normal intravascular flow voids preserved. No acute or chronic intracranial hemorrhage. No mass lesion, midline shift, or mass effect. No hydrocephalus. No extra-axial fluid collection. Craniocervical junction within normal limits. Multilevel degenerative spondylolysis noted within the visualized upper cervical spine. Pituitary gland within normal limits. No acute abnormality about the orbits. Paranasal sinuses are clear. Trace opacity within the right mastoid air cells. Left mastoid air cells clear. Inner ear structures grossly normal. Bone marrow signal intensity within normal limits. No scalp soft tissue abnormality. IMPRESSION: 1. No acute intracranial infarct or other process identified. 2. Mild chronic small vessel ischemic disease with multiple scattered remote cerebellar  infarcts bilaterally. Electronically Signed: By: Jeannine Boga M.D. On: 03/20/2015 23:23   US Abdomen Complete  02/28/2015  CLINICAL DATA:  Abdominal pain. EXAM: ABDOMEN ULTRASOUND COMPLETE COMPARISON:  CT 03/01/2008. FINDINGS: Gallbladder: No gallstones or wall thickening visualized. No sonographic Murphy sign noted by sonographer. Common bile duct: Diameter: 2 mm Liver: No focal lesion identified. Within normal limits in parenchymal echogenicity. IVC: No abnormality visualized. Pancreas: Visualized portion unremarkable. Spleen: Size and appearance within normal limits. Right Kidney: Length: 9.5 cm. Echogenicity within normal limits. No mass or hydronephrosis visualized. Left Kidney: Length: 8.7 sent. Echogenicity within normal limits. No mass or hydronephrosis visualized. Abdominal aorta: No aneurysm visualized. Other findings: Limited exam due to overlying bowel gas. IMPRESSION: Negative exam. Electronically Signed  By: [place]   On: 02/28/2015 09:03   Mr Jodene Nam Head/brain Wo Cm  03/21/2015  CLINICAL DATA:  Initial evaluation for numbness and tingling in left hand. EXAM: MRA HEAD WITHOUT CONTRAST TECHNIQUE: Angiographic images of the Circle of Willis were obtained using MRA technique without intravenous contrast. COMPARISON:  Prior study from 03/08/2010. FINDINGS: ANTERIOR CIRCULATION: Distal cervical segments of the internal carotid arteries are widely patent with antegrade flow. Left ICA is larger than the right. Petrous segments widely patent. Dolichoectasia of the intracranial cavernous and supraclinoid ICAs which are widely patent. Left A1 segment supplies the anterior cerebral arteries, accounting for the size difference in the internal carotid arteries. Anterior communicating artery normal. Anterior cerebral arteries widely patent. M1 segments patent without stenosis or occlusion. MCA bifurcations within normal limits. Distal MCA branches symmetric and well opacified. POSTERIOR  CIRCULATION: Vertebral arteries patent to the vertebrobasilar junction. The left vertebral artery is dominant. The diminutive right vertebral artery divides at the takeoff of the low right posterior inferior cerebral artery, with a small hypoplastic branch ascending towards the vertebrobasilar junction. Basilar arteries somewhat diminutive in tortuous but widely patent. Superior cerebellar arteries well opacified. P1 segments someone hypoplastic, particularly on the right. Widely patent prominent posterior communicating arteries present bilaterally. PCAs well opacified to their distal aspects. No aneurysm. IMPRESSION: 1. No large or proximal arterial branch occlusion. No hemodynamically significant or correctable stenosis. 2. Dolichoectasia of the internal carotid arteries, more prominent left. Finding is stable relative to previous MRA. Electronically Signed   By: Jeannine Boga M.D.   On: 03/21/2015 02:00    echocardiogram   Results pending   Filed Weights   03/20/15 1909  Weight: 59.875 kg (132 lb)     Microbiology: Recent Results (from the past 240 hour(s))  Urine culture     Status: None (Preliminary result)   Collection Time: 03/20/15  7:41 PM  Result Value Ref Range Status   Specimen Description URINE, RANDOM  Final   Special Requests NONE  Final   Culture 70,000 COLONIES/ml ESCHERICHIA COLI  Final   Report Status PENDING  Incomplete       Blood Culture    Component Value Date/Time   SDES URINE, RANDOM 03/20/2015 1941   SPECREQUEST NONE 03/20/2015 1941   CULT 70,000 COLONIES/ml ESCHERICHIA COLI 03/20/2015 1941   REPTSTATUS PENDING 03/20/2015 1941      Labs: Results for orders placed or performed during the hospital encounter of 03/20/15 (from the past 48 hour(s))  Basic metabolic panel     Status: Abnormal   Collection Time: 03/20/15  7:37 PM  Result Value Ref Range   Sodium 141 135 - 145 mmol/L   Potassium 3.8 3.5 - 5.1 mmol/L   Chloride 106 101 - 111 mmol/L    CO2 28 22 - 32 mmol/L   Glucose, Bld 108 (H) 65 - 99 mg/dL   BUN 15 6 - 20 mg/dL   Creatinine, Ser 1.01 (H) 0.44 - 1.00 mg/dL   Calcium 9.0 8.9 - 10.3 mg/dL   GFR calc non Af Amer 53 (L) >60 mL/min   GFR calc Af Amer >60 >60 mL/min    Comment: (NOTE) The eGFR has been calculated using the CKD EPI equation. This calculation has not been validated in all clinical situations. eGFR's persistently <60 mL/min signify possible Chronic Kidney Disease.    Anion gap 7 5 - 15  CBC with Differential     Status: None   Collection Time: 03/20/15  7:37 PM  Result Value Ref Range   WBC 5.3 4.0 - 10.5 K/uL   RBC 4.28 3.87 - 5.11 MIL/uL   Hemoglobin 12.7 12.0 - 15.0 g/dL   HCT 38.9 36.0 - 46.0 %   MCV 90.9 78.0 - 100.0 fL   MCH 29.7 26.0 - 34.0 pg   MCHC 32.6 30.0 - 36.0 g/dL   RDW 13.1 11.5 - 15.5 %   Platelets 155 150 - 400 K/uL   Neutrophils Relative % 66 %   Neutro Abs 3.4 1.7 - 7.7 K/uL   Lymphocytes Relative 25 %   Lymphs Abs 1.4 0.7 - 4.0 K/uL   Monocytes Relative 7 %   Monocytes Absolute 0.4 0.1 - 1.0 K/uL   Eosinophils Relative 2 %   Eosinophils Absolute 0.1 0.0 - 0.7 K/uL   Basophils Relative 0 %   Basophils Absolute 0.0 0.0 - 0.1 K/uL  Urinalysis, Routine w reflex microscopic (not at [facility])     Status: Abnormal   Collection Time: 03/20/15  7:41 PM  Result Value Ref Range   Color, Urine YELLOW YELLOW   APPearance CLEAR CLEAR   Specific Gravity, Urine 1.027 1.005 - 1.030   pH 5.5 5.0 - 8.0   Glucose, UA NEGATIVE NEGATIVE mg/dL   Hgb urine dipstick MODERATE (A) NEGATIVE   Bilirubin Urine NEGATIVE NEGATIVE   Ketones, ur NEGATIVE NEGATIVE mg/dL   Protein, ur NEGATIVE NEGATIVE mg/dL   Nitrite NEGATIVE NEGATIVE   Leukocytes, UA SMALL (A) NEGATIVE  Urine microscopic-add on     Status: Abnormal   Collection Time: 03/20/15  7:41 PM  Result Value Ref Range   Squamous Epithelial / LPF 0-5 (A) NONE SEEN   WBC, UA 6-30 0 - 5 WBC/hpf   RBC / HPF 0-5 0 - 5 RBC/hpf   Bacteria, UA  RARE (A) NONE SEEN   Casts HYALINE CASTS (A) NEGATIVE   Crystals CA OXALATE CRYSTALS (A) NEGATIVE   Urine-Other MUCOUS PRESENT   Urine culture     Status: None (Preliminary result)   Collection Time: 03/20/15  7:41 PM  Result Value Ref Range   Specimen Description URINE, RANDOM    Special Requests NONE    Culture 70,000 COLONIES/ml ESCHERICHIA COLI    Report Status PENDING   Hemoglobin A1c     Status: None   Collection Time: 03/21/15  1:45 AM  Result Value Ref Range   Hgb A1c MFr Bld 5.6 4.8 - 5.6 %    Comment: (NOTE)         Pre-diabetes: 5.7 - 6.4         Diabetes: >6.4         Glycemic control for adults with diabetes: <7.0    Mean Plasma Glucose 114 mg/dL    Comment: (NOTE) Performed At: [facility] [place], [place] [id number] Lindon Romp MD CV:[id number]   Lipid panel     Status: Abnormal   Collection Time: 03/21/15  1:45 AM  Result Value Ref Range   Cholesterol 205 (H) 0 - 200 mg/dL   Triglycerides 75 <150 mg/dL   HDL 73 >40 mg/dL   Total CHOL/HDL Ratio 2.8 RATIO   VLDL 15 0 - 40 mg/dL   LDL Cholesterol 117 (H) 0 - 99 mg/dL    Comment:        Total Cholesterol/HDL:CHD Risk Coronary Heart Disease Risk Table                     Men  Women  1/2 Average Risk   3.4   3.3  Average Risk       5.0   4.4  2 X Average Risk   9.6   7.1  3 X Average Risk  23.4   11.0        Use the calculated Patient Ratio above and the CHD Risk Table to determine the patient's CHD Risk.        ATP III CLASSIFICATION (LDL):  <100     mg/dL   Optimal  100-129  mg/dL   Near or Above                    Optimal  130-159  mg/dL   Borderline  160-189  mg/dL   High  >190     mg/dL   Very High   Protime-INR     Status: None   Collection Time: 03/21/15  1:45 AM  Result Value Ref Range   Prothrombin Time 14.8 11.6 - 15.2 seconds   INR 1.14 0.00 - 1.49  APTT     Status: None   Collection Time: 03/21/15  1:45 AM  Result Value Ref Range   aPTT 29 24 - 37  seconds     Lipid Panel     Component Value Date/Time   CHOL 205* 03/21/2015 0145   TRIG 75 03/21/2015 0145   HDL 73 03/21/2015 0145   CHOLHDL 2.8 03/21/2015 0145   VLDL 15 03/21/2015 0145   LDLCALC 117* 03/21/2015 0145   LDLDIRECT 110.7 03/07/2013 1027     Lab Results  Component Value Date   HGBA1C 5.6 03/21/2015   HGBA1C * 03/08/2010    5.7 (NOTE)                                                                       According to the ADA Clinical Practice Recommendations for 2011, when HbA1c is used as a screening test:   >=6.5%   Diagnostic of Diabetes Mellitus           (if abnormal result  is confirmed)  5.7-6.4%   Increased risk of developing Diabetes Mellitus  References:Diagnosis and Classification of Diabetes Mellitus,Diabetes GYJE,5631,49(FWYOV 1):S62-S69 and Standards of Medical Care in         Diabetes - 2011,Diabetes Care,2011,34  (Suppl 1):S11-S61.     Lab Results  Component Value Date   LDLCALC 117* 03/21/2015   CREATININE 1.01* 03/20/2015     75 year old female with history of recurrent TIA, stroke, gastroparesis, IBS, diverticulosis, depression, anxiety, hyperlipidemia who presented with left hand tingling and numbness. She had been seen approximately 4 days prior to admission in the emergency department because she had had several falls with head trauma and had had persistent off-balance sensation since that time. She had a CT head which demonstrated no acute abnormalities and was told that she possibly had a concussion and to start brain rest. She presented to the emergency department after she noticed acute onset of numbness of the left hand and a little bit on her left face which lasted for a couple of minutes and resolved. She had stuttering recurrence of the symptoms while in the emergency department. In the emergency department, her urinalysis was concerning  for urinary tract infection although she was asymptomatic, MRI of the brain showed no acute  abnormality. She was seen by neurology who was concerned for TIA. She was admitted for further evaluation and treatment. Per family, she has been noncompliant with her aspirin and takes her rosuvastatin only intermittently.  Assessment/Plan  Numbness and tingling in left hand: it is concerning for TIA. Her MRI has no acute issues. Neurology was consulted. - Telemetry: Normal sinus rhythm - Risk factor modification: HgbA1c 5.6,   LDL 117 Cont statin On aspirin 325 mg daily prior to admission (was ordered, but she admits to not taking it routinely "due to black spot on my leg"), put back aspirin 325 mg daily. Continue aspirin at discharge. - MRA of the brain without contrast demonstrated no evidence of stenosis - PT consult, OT consult, Speech consult pending - 2 d Echocardiogram pending - Ekg normal sinus rhythm - Carotid dopplers 1-39% stenosis bilaterally with antegrade vertebral artery flow - follow-up with Dr. Jannifer Franklin as an outpatient  HTN - Monitor blood pressures - stable at DC   HLD: Last LDL was 72 on 01/04/15, now  117  - Increase   daily Crestor  GERD: -Pepcide and protonix  Depression and anxiety: Stable, no suicidal or homicidal ideations. -Continue home medications: Xanax. Cymbalta.  Irritable bowel syndrome: stable -on Bentyl  E coli  UTI (lower urinary tract infection): patient has mild burning on urination and increased urinary frequency. Her urinalysis is positive for UTI with small amount of leukocytes. -cont Ciprofloxacin       Discharge Exam:    Blood pressure 117/66, pulse 66, temperature 98.6 F (37 C), temperature source Oral, resp. rate 20, height _0  (1.549 m), weight 59.875 kg (132 lb), SpO2 96 %.    General: adult female, No acute distress  HEENT: NCAT, MMM  Cardiovascular: RRR, nl S1, S2 no mrg, 2+ pulses, warm extremities  Respiratory: CTAB, no increased WOB  Abdomen: NABS, soft, NT/ND  MSK: Normal tone and bulk, no  LEE NeuroCranial nerves II through XII grossly intact except for possible decreased sensation to light touch of the left for head left cheek and left jaw   Follow-up Information    Follow up with Lenor Coffin, MD In 2 months.   Specialty:  Neurology   Why:  Office will call you with appointment date & time, for hospital follow up   Contact information:   [address] [place] [address] [phone]       Follow up with Annye Asa, MD. Schedule an appointment as soon as possible for a visit in 3 days.   Specialty:  Family Medicine   Contact information:   [address] [place] [address] [phone]       Signed: Reyne Dumas 03/22/2015, 1:42 PM        Time spent >45 mins

## 2015-03-22 NOTE — Progress Notes (Signed)
Patient is discharged from room 5C12 at this time. Alert and in stable condition. IV site d/c'd as well as tele. Instructions read to patient and understanding verbalized. Ambulated out of unit with husband and all belongings at side.

## 2015-03-22 NOTE — Care Management Note (Signed)
Case Management Note  Patient Details  Name: Paige Pena MRN: [id number] Date of Birth: March 27, 1940  Subjective/Objective:                    Action/Plan: CM met with patient to discuss discharge planning. Orders were received to arrange outpatient PT for patient. Patient is agreeable and has chosen [facility]. Electronic referral was placed in Epic. Appointment information was added to the AVS.  Expected Discharge Date:                  Expected Discharge Plan:  Home/Self Care  In-House Referral:     Discharge planning Services  CM Consult  Post Acute Care Choice:    Choice offered to:  Patient  DME Arranged:    DME Agency:     HH Arranged:    [place] Agency:     Status of Service:  Completed, signed off  Medicare Important Message Given:    Date Medicare IM Given:    Medicare IM give by:    Date Additional Medicare IM Given:    Additional Medicare Important Message give by:     If discussed at [place] of Stay Meetings, dates discussed:    Additional CommentsRolm Baptise, RN 03/22/2015, 2:18 PM [phone]

## 2015-03-22 NOTE — Progress Notes (Signed)
*  PRELIMINARY RESULTS* Echocardiogram TTE has been performed.  Leavy Cella 03/22/2015, 1:15 PM

## 2015-03-22 NOTE — Progress Notes (Signed)
STROKE TEAM PROGRESS NOTE   HISTORY OF PRESENT ILLNESS Paige Pena is an 75 y.o. female hx of TIA, HLD presenting with left hand numbness. Today 03/20/2015 noted recurrent episodes of left hand weakness and numbness. Symptoms came and went though she notes continued numbness on the left upper extremity. She reports that 2 weeks ago she hit her head and suffered a concussion. Since then she has been noting some paresthesias on the left side though the current symptoms are different. Patient was not administered TPA. She was admitted for further evaluation and treatment.   SUBJECTIVE (INTERVAL HISTORY) Her husband is at the bedside.  Overall she feels her condition is completely resolved. Cardiac echo is yet pending OBJECTIVE Temp:  [97.7 F (36.5 C)-98.7 F (37.1 C)] 98.2 F (36.8 C) (01/26 1351) Pulse Rate:  [64-84] 84 (01/26 1351) Cardiac Rhythm:  [-] Normal sinus rhythm (01/26 0731) Resp:  [18-20] 20 (01/26 1351) BP: (117-148)/(66-86) 126/74 mmHg (01/26 1351) SpO2:  [90 %-100 %] 100 % (01/26 1351)  CBC:   Recent Labs Lab 03/16/15 1821 03/20/15 1937  WBC 4.3 5.3  NEUTROABS 2.0 3.4  HGB 12.8 12.7  HCT 39.0 38.9  MCV 90.3 90.9  PLT 146* [id number]    Basic Metabolic Panel:   Recent Labs Lab 03/16/15 1821 03/20/15 1937  NA 141 141  K 3.9 3.8  CL 106 106  CO2 27 28  GLUCOSE 95 108*  BUN 12 15  CREATININE 0.91 1.01*  CALCIUM 9.1 9.0    Lipid Panel:     Component Value Date/Time   CHOL 205* 03/21/2015 0145   TRIG 75 03/21/2015 0145   HDL 73 03/21/2015 0145   CHOLHDL 2.8 03/21/2015 0145   VLDL 15 03/21/2015 0145   LDLCALC 117* 03/21/2015 0145   HgbA1c:  Lab Results  Component Value Date   HGBA1C 5.6 03/21/2015   Urine Drug Screen: No results found for: LABOPIA, COCAINSCRNUR, LABBENZ, AMPHETMU, THCU, LABBARB    IMAGING  Mr Brain Wo Contrast 03/21/2015  1. No acute intracranial infarct or other process identified. 2. Mild chronic small vessel ischemic disease  with multiple scattered remote cerebellar infarcts bilaterally. Upon further review, there is mild cerebellar tonsillar ectopia of approximately 4 mm at the craniocervical junction. Overall, the appearance of this is similar relative to previous MRI from 2012.   Mr Jodene Nam Head/brain Wo Cm 03/21/2015   1. No large or proximal arterial branch occlusion. No hemodynamically significant or correctable stenosis. 2. Dolichoectasia of the internal carotid arteries, more prominent left. Finding is stable relative to previous MRA.   Carotid Doppler   There is 1-39% bilateral ICA stenosis. Vertebral artery flow is antegrade.     PHYSICAL EXAM Pleasant elderly African-American lady currently not in distress. . Afebrile. Head is nontraumatic. Neck is supple without bruit.    Cardiac exam no murmur or gallop. Lungs are clear to auscultation. Distal pulses are well felt. Neurological Exam ;  Awake  Alert oriented x 3. Normal speech and language.eye movements full without nystagmus.fundi were not visualized. Vision acuity and fields appear normal. Hearing is normal. Palatal movements are normal. Face symmetric. Tongue midline. Normal strength, tone, reflexes and coordination. Normal sensation. Gait deferred. ASSESSMENT/PLAN Paige Pena is a 75 y.o. female with history of TIA and HLD presenting with recurrent LUE numbness and weakness. She did not receive IV t-PA.   R brain TIA  Resultant  Tingling in her left hand  MRI  No acute stroke  MRA  No large vessel stenosis  Carotid Doppler  No significant stenosis   2D Echo  pending   LDL 117  HgbA1c 5.6  Lovenox 30 mg sq daily for VTE prophylaxis Diet Heart Room service appropriate?: Yes; Fluid consistency:: Thin Diet - low sodium heart healthy Diet - low sodium heart healthy  aspirin 325 mg daily prior to admission (was ordered, but she admits to not taking it routinely "due to black spot on my leg"), put back  aspirin 325 mg daily. Continue  aspirin at discharge.  Patient counseled to be compliant with her antithrombotic medications  Ongoing aggressive stroke risk factor management  Therapy recommendations:  pending   Disposition:  Return home  [facility] for discharge once workup completed  Follow up with Dr. Jannifer Franklin. Order written.  Hyperlipidemia  Home meds:  crestor 5, fish oil, resumed in hospital  LDL 117, goal < 70  Consider increasing crestor dose  Continue statin at discharge  Other Stroke Risk Factors  Advanced age  Hx TIA  Other Active Problems  Baseline cognitive deficits, followed by Dr. Jannifer Franklin  Fibromyalgia  GERD  Depression and anxiety  IBS  Possible UTI  Hospital day # [place] for Pager information 03/22/2015 2:50 PM  I have personally examined this patient, reviewed notes, independently viewed imaging studies, participated in medical decision making and plan of care. I have made any additions or clarifications directly to the above note. Agree with note above. She presented with transient left hand weakness and numbness likely due to right MCA branch TIA   Continue aspirin and stroke workup . Discussed with patient and husband and answered questions Antony Contras, MD Medical Director Page Pager: [phone] 03/22/2015 2:50 PM    To contact Stroke Continuity provider, please refer to [email or web address] After hours, contact General Neurology

## 2015-03-22 NOTE — Evaluation (Signed)
Physical Therapy Evaluation Patient Details Name: Paige Pena MRN: [id number] DOB: 06-11-1940 Today's Date: 03/22/2015   History of Present Illness  Paige Pena is an 75 y.o. female hx of TIA, HLD presenting with left hand numbness. Noted recurrent episodes of left hand weakness and numbness. Symptoms came and went though she notes continued numbness on the left upper extremity. She reports that 2 weeks ago she hit her head and suffered a concussion. Since then she has been noting some paresthesias on the left side though the current symptoms are different.   Clinical Impression  Pt admitted with above diagnosis. Pt currently with functional limitations due to the deficits listed below (see PT Problem List).  Pt will benefit from skilled PT to increase their independence and safety with mobility to allow discharge to the venue listed below.  Pt moving well overall, but a feeling of heaviness and altered sensation in L LE, and pt feeling like she is not at her baseline functioning, plus had a concussion from fall 2 weeks ago. Recommend outpatient PT and pt is very interested in this to return to her high prior level of function.     Follow Up Recommendations Outpatient PT    Equipment Recommendations  None recommended by PT    Recommendations for Other Services       Precautions / Restrictions Precautions Precautions: None      Mobility  Bed Mobility Overal bed mobility: Independent                Transfers Overall transfer level: Modified independent                  Ambulation/Gait Ambulation/Gait assistance: Supervision Ambulation Distance (Feet): 275 Feet Assistive device: None Gait Pattern/deviations: Step-through pattern     General Gait Details: Step through pattern, but pt tends to keep L hand on top of L thigh due to feeling that it feels "heavy and different".  No LOB with head turns, head nods, or quick stops.  Stairs Stairs: Yes Stairs  assistance: Min guard Stair Management: Step to pattern;Alternating pattern;No rails Number of Stairs: 2 (x2 reps) General stair comments: Did 2 steps with step to pattern and then another rep with alternating pattern and no rail.  some difficulty with step through pattern with descent.  Wheelchair Mobility    Modified Rankin (Stroke Patients Only) Modified Rankin (Stroke Patients Only) Pre-Morbid Rankin Score: No significant disability Modified Rankin: No significant disability     Balance Overall balance assessment: Needs assistance           Standing balance-Leahy Scale: Good                               Pertinent Vitals/Pain Pain Assessment: No/denies pain ("sensation on L side of head she thinks it is from concussion 2 weeks ago)    Home Living Family/patient expects to be discharged to:: Private residence Living Arrangements: Spouse/significant other Available Help at Discharge: Family;Available 24 hours/day Type of Home: House Home Access: Stairs to enter Entrance Stairs-Rails: None Entrance Stairs-Number of Steps: 2 Home Layout: One level Home Equipment: None      Prior Function Level of Independence: Independent               Hand Dominance        Extremity/Trunk Assessment   Upper Extremity Assessment: Defer to OT evaluation  Lower Extremity Assessment: RLE deficits/detail;LLE deficits/detail   LLE Deficits / Details: strength 4+/5 but reports feeling a heaviness and a different feeling to it.     Communication   Communication: No difficulties  Cognition Arousal/Alertness: Awake/alert Behavior During Therapy: WFL for tasks assessed/performed Overall Cognitive Status: Within Functional Limits for tasks assessed                      General Comments General comments (skin integrity, edema, etc.): Pt is interested in outpatient PT to get her balance back.    Exercises        Assessment/Plan    PT  Assessment    PT Diagnosis Difficulty walking   PT Problem List    PT Treatment Interventions     PT Goals (Current goals can be found in the Care Plan section) Acute Rehab PT Goals Patient Stated Goal: to get back to doing what she was doing before PT Goal Formulation: With patient Time For Goal Achievement: 03/29/15 Potential to Achieve Goals: Good    Frequency     Barriers to discharge        Co-evaluation               End of Session Equipment Utilized During Treatment: Gait belt Activity Tolerance: Patient tolerated treatment well Patient left: in chair;with call bell/phone within reach Nurse Communication: Mobility status    Functional Assessment Tool Used: objective findings Functional Limitation: Mobility: Walking and moving around Mobility: Walking and Moving Around Current Status [phone]): At least 1 percent but less than 20 percent impaired, limited or restricted Mobility: Walking and Moving Around Goal Status [phone]): At least 1 percent but less than 20 percent impaired, limited or restricted    Time: 1035-1056 PT Time Calculation (min) (ACUTE ONLY): 21 min   Charges:   PT Evaluation $PT Eval Moderate Complexity: 1 Procedure     PT G Codes:   PT G-Codes **NOT FOR INPATIENT CLASS** Functional Assessment Tool Used: objective findings Functional Limitation: Mobility: Walking and moving around Mobility: Walking and Moving Around Current Status [phone]): At least 1 percent but less than 20 percent impaired, limited or restricted Mobility: Walking and Moving Around Goal Status [phone]): At least 1 percent but less than 20 percent impaired, limited or restricted    [employer] [facility] LUBECK 03/22/2015, 11:51 AM

## 2015-03-22 NOTE — Progress Notes (Signed)
SLP Cancellation Note  Patient Details Name: EIZABETH ZACARIAS MRN: [id number] DOB: 1940-03-08   Cancelled treatment:       Reason Eval/Treat Not Completed: SLP screened, no needs identified, will sign off   Juan Quam Laurice 03/22/2015, 2:09 PM

## 2015-03-23 ENCOUNTER — Telehealth: Payer: Self-pay

## 2015-03-23 ENCOUNTER — Other Ambulatory Visit: Payer: Self-pay | Admitting: General Practice

## 2015-03-23 LAB — URINE CULTURE: Culture: 70000

## 2015-03-23 MED ORDER — NITROFURANTOIN MONOHYD MACRO 100 MG PO CAPS: 100.0000 mg | ORAL_CAPSULE | Freq: Two times a day (BID) | ORAL | Status: AC

## 2015-03-23 NOTE — Telephone Encounter (Signed)
PCP: Annye Asa, MD   Admit date: 03/20/2015 Discharge date: 03/22/2015  Discharge Diagnoses:   Principal Problem:  Numbness and tingling in left hand Active Problems:  HYPERCHOLESTEROLEMIA  ESOPHAGEAL STRICTURE  GERD  Gastroparesis  Irritable bowel syndrome  Anxiety and depression  TIA (transient ischemic attack)  UTI (lower urinary tract infection)  Follow-up recommendations Follow-up with PCP in 3-5 days , including all additional recommended appointments as below Follow-up CBC, CMP in 3-5 days Follow up with neurology in 2-3 weeks  Echocardiogram Results pending at the time of [facility] follow up scheduled for Monday, Jan 30th at 11 am with Dr.Tabori.    Transition Care Management Follow-up Telephone Call  How have you been since you were released from the hospital? Pt states she's doing better.  No numbness or tingling in left hand.  Continues to have dull pain/tingling sensation in the back of her head.  Feels fatigued from hospital stay.     Do you understand why you were in the hospital? yes   Do you understand the discharge instructions? yes  Items Reviewed:  Medications reviewed: yes  Allergies reviewed: yes  Dietary changes reviewed: yes, low sodium heart healthy--states she would like to speak to a dietician/nutritionist due to cholesterol  Referrals reviewed: yes, neurology.  Appt scheduled.     Functional Questionnaire:   Activities of Daily Living (ADLs):   She states they are independent in the following: ambulation, bathing and hygiene, feeding, continence, grooming, toileting and dressing States they require assistance with the following: none   Any transportation issues/concerns?: no, husband will probably drive her to her appt.     Any patient concerns? no   Confirmed importance and date/time of follow-up visits scheduled: yes   Confirmed with patient if condition begins to worsen call PCP or go to the ER:  yes

## 2015-03-26 ENCOUNTER — Encounter: Payer: Self-pay | Admitting: Family Medicine

## 2015-03-26 ENCOUNTER — Ambulatory Visit (INDEPENDENT_AMBULATORY_CARE_PROVIDER_SITE_OTHER): Payer: Medicare Other | Admitting: Family Medicine

## 2015-03-26 ENCOUNTER — Telehealth: Payer: Self-pay | Admitting: Neurology

## 2015-03-26 VITALS — BP 138/80 | HR 99 | Temp 98.2°F | Ht 59.0 in | Wt 131.6 lb

## 2015-03-26 DIAGNOSIS — G459 Transient cerebral ischemic attack, unspecified: Secondary | ICD-10-CM | POA: Diagnosis not present

## 2015-03-26 LAB — CBC WITH DIFFERENTIAL/PLATELET
BASOS PCT: 0.5 % (ref 0.0–3.0)
Basophils Absolute: 0 10*3/uL (ref 0.0–0.1)
EOS PCT: 1.4 % (ref 0.0–5.0)
Eosinophils Absolute: 0.1 10*3/uL (ref 0.0–0.7)
HEMATOCRIT: 41.2 % (ref 36.0–46.0)
HEMOGLOBIN: 13.6 g/dL (ref 12.0–15.0)
LYMPHS PCT: 25.7 % (ref 12.0–46.0)
Lymphs Abs: 1.6 10*3/uL (ref 0.7–4.0)
MCHC: 33 g/dL (ref 30.0–36.0)
MCV: 89.6 fl (ref 78.0–100.0)
MONO ABS: 0.4 10*3/uL (ref 0.1–1.0)
Monocytes Relative: 7.2 % (ref 3.0–12.0)
NEUTROS ABS: 4 10*3/uL (ref 1.4–7.7)
Neutrophils Relative %: 65.2 % (ref 43.0–77.0)
PLATELETS: 164 10*3/uL (ref 150.0–400.0)
RBC: 4.6 Mil/uL (ref 3.87–5.11)
RDW: 13.6 % (ref 11.5–15.5)
WBC: 6.1 10*3/uL (ref 4.0–10.5)

## 2015-03-26 LAB — BASIC METABOLIC PANEL
BUN: 14 mg/dL (ref 6–23)
CHLORIDE: 102 meq/L (ref 96–112)
CO2: 31 mEq/L (ref 19–32)
CREATININE: 1.01 mg/dL (ref 0.40–1.20)
Calcium: 9.2 mg/dL (ref 8.4–10.5)
GFR: 68.57 mL/min (ref >60.00)
GLUCOSE: 70 mg/dL (ref 70–99)
POTASSIUM: 3.8 meq/L (ref 3.5–5.1)
Sodium: 139 mEq/L (ref 135–145)

## 2015-03-26 LAB — HEPATIC FUNCTION PANEL
ALT: 15 U/L (ref 0–35)
AST: 24 U/L (ref 0–37)
Albumin: 4.1 g/dL (ref 3.5–5.2)
Alkaline Phosphatase: 87 U/L (ref 39–117)
BILIRUBIN TOTAL: 0.5 mg/dL (ref 0.2–1.2)
Bilirubin, Direct: 0.1 mg/dL (ref 0.0–0.3)
Total Protein: 7.5 g/dL (ref 6.0–8.3)

## 2015-03-26 MED ORDER — ACETAMINOPHEN 325 MG PO CAPS: 325.0000 mg | ORAL_CAPSULE | Freq: Two times a day (BID) | ORAL | Status: DC

## 2015-03-26 NOTE — Progress Notes (Signed)
   Subjective:    Patient ID: Paige Pena, female    DOB: 1940/03/15, 75 y.o.   MRN: [id number]  [facility] f/u- pt was admitted 1/24-26 for TIA w/ numbness and tingling in L hand.  Had MRI/MRA, carotid dopplers that showed no significant stenosis.  ECHO showed normal EF w/ likely diastolic dysfxn.  Now on ASA 325mg  daily.  Has neuro f/u pending.  Pt reports numbness and tingling has resolved.  Still some foggy feeling.  No CP, SOB, HA, visual changes but pt feels vision may be more dull than previous.  D/C summary recommended repeat labs today.  Pt has daughter on speaker phone to discuss ECHO results and get any questions answered.   Review of Systems For ROS see HPI     Objective:   Physical Exam  Constitutional: She is oriented to person, place, and time. She appears well-developed and well-nourished. No distress.  HENT:  Head: Normocephalic and atraumatic.  Eyes: Conjunctivae and EOM are normal. Pupils are equal, round, and reactive to light.  Neck: Normal range of motion. Neck supple. No thyromegaly present.  Cardiovascular: Normal rate, regular rhythm, normal heart sounds and intact distal pulses.   No murmur heard. Pulmonary/Chest: Effort normal and breath sounds normal. No respiratory distress.  Abdominal: Soft. She exhibits no distension. There is no tenderness.  Musculoskeletal: She exhibits no edema.  Lymphadenopathy:    She has no cervical adenopathy.  Neurological: She is alert and oriented to person, place, and time.  Skin: Skin is warm and dry.  Psychiatric: She has a normal mood and affect. Her behavior is normal.  Vitals reviewed.         Assessment & Plan:

## 2015-03-26 NOTE — Telephone Encounter (Signed)
I called the patient. The patient had an episode last week of left hand weakness and numbness that came on suddenly while washing her face. The episode lasted about 2 or 3 minutes, the patient went to the hospital for an evaluation, workup was unremarkable. No acute stroke was seen. The patient has a presumed TIA, I will follow-up with the patient in 2 or 3 weeks.

## 2015-03-26 NOTE — Patient Instructions (Signed)
Follow up as scheduled We'll notify you of your lab results and make any changes if needed Schedule an appt w/ neurology for followup Try and rest your brain! Continue to make healthy food choices and get regular activity Call with any questions or concerns Hang in there!!!

## 2015-03-26 NOTE — Progress Notes (Signed)
Pre visit review using our clinic review tool, if applicable. No additional management support is needed unless otherwise documented below in the visit note. 

## 2015-03-26 NOTE — Telephone Encounter (Signed)
Patient's daughter called Friday afternoon after office hours requesting help with her mother Paige Pena , who she stated has a headache. It turns out that the patient was admitted to the hospital for a TIA workup and discharged on Thursday. The patient takes a aspirin daily for TIA prevention. Her daughter was concerned that she may not have to add any other medications however she was also concerned about her headache. Eyes instructed to give her mother Tylenol up to twice a day and to check that no medication containing Tylenol-acetaminophen by prescription or over-the-counter is taken already.  The daughter had called the PCP and got no response ( ? ) The daughter was not aware of the office being closed and will make a RV appointment with Paige Pena , instructed to call this Monday . CD

## 2015-03-27 ENCOUNTER — Encounter: Payer: Self-pay | Admitting: General Practice

## 2015-03-27 NOTE — Telephone Encounter (Signed)
I called the patient. Appointment scheduled 2/14.

## 2015-03-28 NOTE — Assessment & Plan Note (Addendum)
New.  Pt has Neuro f/u pending.  MRI, ECHO, carotid dopplers were all unrevealing as to cause of pt's sxs.  The numbness and tingling have completely resolved.  The HA that she had yesterday has resolved.  She continues to have some concussion sxs- foggy feeling, dull vision- and I recommended continued physical and cognitive rest.  She is on full dose ASA and on a statin for risk reduction.  Discussed need for lipid control, BP management to prevent future issues.  As recommended by d/c summary, will get f/u lab work today.  Pt expressed understanding and is in agreement w/ plan as did daughter in [place].

## 2015-04-03 ENCOUNTER — Encounter: Payer: Self-pay | Admitting: Physical Therapy

## 2015-04-03 ENCOUNTER — Ambulatory Visit: Payer: Medicare Other | Attending: Family Medicine | Admitting: Physical Therapy

## 2015-04-03 DIAGNOSIS — M6289 Other specified disorders of muscle: Secondary | ICD-10-CM | POA: Diagnosis present

## 2015-04-03 DIAGNOSIS — G459 Transient cerebral ischemic attack, unspecified: Secondary | ICD-10-CM

## 2015-04-03 DIAGNOSIS — R262 Difficulty in walking, not elsewhere classified: Secondary | ICD-10-CM | POA: Diagnosis present

## 2015-04-03 DIAGNOSIS — R531 Weakness: Secondary | ICD-10-CM

## 2015-04-03 NOTE — Therapy (Signed)
[place], [place], [address] Phone: [phone]   Fax:  [phone]  Physical Therapy Evaluation  Patient Details  Name: Paige Pena MRN: [id number] Date of Birth: [birth year] Referring Provider: [name]  Encounter Date: 04/03/2015      PT End of Session - 04/03/15 1400    Visit Number 1   Date for PT Re-Evaluation 06/01/15   PT Start Time 1315   PT Stop Time 1357   PT Time Calculation (min) 42 min   Activity Tolerance Patient tolerated treatment well   Behavior During Therapy [facility] for tasks assessed/performed      Past Medical History  Diagnosis Date  . Dysphonia   . Asthmatic bronchitis   . Chest pain, atypical   . Cerebrovascular disease   . Hypercholesteremia   . GERD (gastroesophageal reflux disease)   . Acute cystitis   . Esophageal stricture   . Gastroparesis   . Diverticulosis of colon   . IBS (irritable bowel syndrome)   . Hemorrhoids   . Urinary incontinence   . Hematuria, microscopic   . DJD (degenerative joint disease)   . Fibromyalgia   . Anxiety   . Vitamin D deficiency   . RLS (restless legs syndrome)   . Aphasia     transsient aphasia Jan 2012  . Memory loss   . Memory deficits 02/15/2013  . TIA (transient ischemic attack)     Past Surgical History  Procedure Laterality Date  . Abdominal hysterectomy  1995  . Corneal transplant for keratonconus    . Right bunion surgery    . Left foot fracture with orif  07/2004    Dr. Sharol Given  . Eye surgery  2013    There were no vitals filed for this visit.  Visit Diagnosis:  Transient cerebral ischemia, unspecified transient cerebral ischemia type  Difficulty walking  Left-sided weakness      Subjective Assessment - 04/03/15 1313    Subjective Pt states she had TIA around 03/20/2015. She suffered a concussion right before the TIA from hiting her head on bathroom vanity and kitchen counter on a separate ocassion.  States she feels unsteady at times and like her body wants to go to the left. Pt states she has some weakness in LLE.   Currently in Pain? No/denies            [facility] PT Assessment - 04/03/15 0001    Assessment   Medical Diagnosis TIA   Referring Provider Tabori   Onset Date/Surgical Date 03/20/15   Hand Dominance Right   Prior Therapy inpatient PT   Precautions   Precautions None   Restrictions   Weight Bearing Restrictions No   Balance Screen   Has the patient fallen in the past 6 months Yes   How many times? 1   Has the patient had a decrease in activity level because of a fear of falling?  Yes   Is the patient reluctant to leave their home because of a fear of falling?  Yes  due to feeling tired and sleepy   [place] residence   Living Arrangements Spouse/significant other   Type of [place] to enter   [place] One level   Prior Function   Level of Independence Independent   Vocation Retired   Leisure exercises at gym   ROM / Consulting civil engineer  AROM / PROM / Strength Strength   Strength   Overall Strength Comments bilateral shoulder flexion 4+/5, left elbow flexion 4+/5, right elbow flexion 5/5, left elbow extension 4+/5, right elbow extension 5/5, left hip flexion 3+/5, right hip flexion 4+/5, bilateral knee extension 5/5, bilateral ankle DF 4+/5   Balance   Balance Assessed Yes   Standardized Balance Assessment   Standardized Balance Assessment Timed Up and Go Test   Timed Up and Go Test   TUG Normal TUG   Normal TUG (seconds) 16   High Level Balance   High Level Balance Comments able to stand on airex with feet together and eyes closed with minimal difficulty, difficulty with tandem walking                   [facility] Adult PT Treatment/Exercise - 04/03/15 0001    Exercises   Exercises Knee/Hip   Knee/Hip Exercises: Aerobic   Nustep Level 5 x 5 minutes   Knee/Hip  Exercises: Standing   Other Standing Knee Exercises standing on airex stepping over and forward x10 each leg, 2# hip abd and ext x10 each leg standing on airex                PT Education - 04/03/15 1359    Education provided Yes   Education Details HEP balance exercises using support surface   Person(s) Educated Patient   Methods Explanation;Demonstration;Handout   Comprehension Returned demonstration;Verbalized understanding          PT Short Term Goals - 04/03/15 1403    PT SHORT TERM GOAL #1   Title Pt will be independent with HEP   Time 1   Period Weeks   Status New           PT Long Term Goals - 04/03/15 1404    PT LONG TERM GOAL #1   Title Pt will decrease TUG time to 10 seconds to decrease fall risk.   Time 4   Period Weeks   Status New   PT LONG TERM GOAL #2   Title Pt will ambulate at least 600 feet independently with no deviation and minimal loss of balance.    Time 4   Period Weeks   Status New   PT LONG TERM GOAL #3   Title Pt will demonstrate ability to tandem walk for at least 15 feet with minimal loss of balance.    Time 4   Period Weeks   Status New   PT LONG TERM GOAL #4   Title Pt will feel comfortable shopping independently.   Time 4   Period Weeks   Status New               Plan - 04/03/15 1400    Clinical Impression Statement Pt has some deficits in balance on uneven surfaces as well as weakness in her LLE. She was able to complete all exercises as instructed today and demonstrated HEP expercises properly.    Pt will benefit from skilled therapeutic intervention in order to improve on the following deficits Decreased balance;Decreased strength;Difficulty walking   Rehab Potential Good   PT Frequency 1x / week   PT Duration 4 weeks   PT Treatment/Interventions ADLs/Self Care Home Management;Electrical Stimulation;Moist Heat;Therapeutic exercise;Therapeutic activities;Gait training;Balance training;Neuromuscular  re-education;Patient/family education;Manual techniques;Energy conservation   PT Next Visit Plan progress balance and hip strengthening exercises   PT Home Exercise Plan balance exercises using support surface for BUE   Consulted and Agree with Plan  of Care Patient         Problem List Patient Active Problem List   Diagnosis Date Noted  . Numbness and tingling in left hand 03/21/2015  . TIA (transient ischemic attack) 03/21/2015  . UTI (lower urinary tract infection) 03/21/2015  . Physical exam 10/05/2014  . Abdominal pain 05/17/2014  . Atypical chest pain 05/17/2014  . Allergic rhinitis 05/17/2014  . Fatigue 04/04/2014  . Anxiety and depression 07/25/2013  . Weight loss, unintentional 07/25/2013  . Sinusitis, acute, maxillary 07/13/2013  . Cerumen impaction 07/13/2013  . Memory deficits 02/15/2013  . Lumbar spondylosis 09/21/2012  . Single episode of elevated blood pressure 09/29/2011  . Palpitations 09/16/2010  . Left leg pain 07/04/2010  . OTHER ABNORMALITY OF URINATION 09/25/2009  . DERMATITIS 03/23/2009  . Cerebrovascular disease 04/03/2008  . DYSPHONIA 04/03/2008  . CHEST PAIN, ATYPICAL 04/03/2008  . ASTHMATIC BRONCHITIS, ACUTE 04/02/2008  . DIVERTICULOSIS OF COLON 04/02/2008  . Irritable bowel syndrome 04/02/2008  . DEGENERATIVE JOINT DISEASE 04/02/2008  . HEMATURIA, MICROSCOPIC, HX OF 04/02/2008  . Vitamin D deficiency 03/28/2008  . HYPERCHOLESTEROLEMIA 03/28/2008  . FIBROMYALGIA 03/28/2008  . URINARY INCONTINENCE 03/28/2008  . ACUTE CYSTITIS 03/22/2008  . GERD 08/02/2007  . Gastroparesis 08/02/2007  . HEMORRHOIDS 07/30/2007  . ESOPHAGEAL STRICTURE 07/30/2007    Barbette Merino, SPT 04/03/2015, 2:11 PM  [place], [place], [address] Phone: [phone]   Fax:  [phone]  Name: KALLISSA HONTS MRN: [id number] Date of Birth: 09/27/1940

## 2015-04-10 ENCOUNTER — Ambulatory Visit (INDEPENDENT_AMBULATORY_CARE_PROVIDER_SITE_OTHER): Payer: Medicare Other | Admitting: Neurology

## 2015-04-10 ENCOUNTER — Ambulatory Visit: Payer: Medicare Other | Admitting: Physical Therapy

## 2015-04-10 ENCOUNTER — Encounter: Payer: Self-pay | Admitting: Neurology

## 2015-04-10 VITALS — BP 147/82 | HR 97 | Ht 61.0 in | Wt 132.0 lb

## 2015-04-10 DIAGNOSIS — R413 Other amnesia: Secondary | ICD-10-CM | POA: Diagnosis not present

## 2015-04-10 DIAGNOSIS — G459 Transient cerebral ischemic attack, unspecified: Secondary | ICD-10-CM

## 2015-04-10 MED ORDER — PHOSPHATIDYLSERINE-DHA-EPA 100-19.5-6.5 MG PO CAPS: 1.0000 | ORAL_CAPSULE | Freq: Every day | ORAL | Status: DC

## 2015-04-10 NOTE — Patient Instructions (Signed)
Transient Ischemic Attack °A transient ischemic attack (TIA) is a "warning stroke" that causes stroke-like symptoms. Unlike a stroke, a TIA does not cause permanent damage to the brain. The symptoms of a TIA can happen very fast and do not last long. It is important to know the symptoms of a TIA and what to do. This can help prevent a major stroke or death. °CAUSES  °A TIA is caused by a temporary blockage in an artery in the brain or neck (carotid artery). The blockage does not allow the brain to get the blood supply it needs and can cause different symptoms. The blockage can be caused by either: °· A blood clot. °· Fatty buildup (plaque) in a neck or brain artery. °RISK FACTORS °· High blood pressure (hypertension). °· High cholesterol. °· Diabetes mellitus. °· Heart disease. °· The buildup of plaque in the blood vessels (peripheral artery disease or atherosclerosis). °· The buildup of plaque in the blood vessels that provide blood and oxygen to the brain (carotid artery stenosis). °· An abnormal heart rhythm (atrial fibrillation). °· Obesity. °· Using any tobacco products, including cigarettes, chewing tobacco, or electronic cigarettes. °· Taking oral contraceptives, especially in combination with using tobacco. °· Physical inactivity. °· A diet high in fats, salt (sodium), and calories. °· Excessive alcohol use. °· Use of illegal drugs (especially cocaine and methamphetamine). °· Being female. °· Being African American. °· Being over the age of 55 years. °· Family history of stroke. °· Previous history of blood clots, stroke, TIA, or heart attack. °· Sickle cell disease. °SIGNS AND SYMPTOMS  °TIA symptoms are the same as a stroke but are temporary. These symptoms usually develop suddenly, or may be newly present upon waking from sleep: °· Sudden weakness or numbness of the face, arm, or leg, especially on one side of the body. °· Sudden trouble walking or difficulty moving arms or legs. °· Sudden  confusion. °· Sudden personality changes. °· Trouble speaking (aphasia) or understanding. °· Difficulty swallowing. °· Sudden trouble seeing in one or both eyes. °· Double vision. °· Dizziness. °· Loss of balance or coordination. °· Sudden severe headache with no known cause. °· Trouble reading or writing. °· Loss of bowel or bladder control. °· Loss of consciousness. °DIAGNOSIS  °Your health care provider may be able to determine the presence or absence of a TIA based on your symptoms, history, and physical exam. CT scan of the brain is usually performed to help identify a TIA. Other tests may include: °· Electrocardiography (ECG). °· Continuous heart monitoring. °· Echocardiography. °· Carotid ultrasonography. °· MRI. °· A scan of the brain circulation. °· Blood tests. °TREATMENT  °Since the symptoms of TIA are the same as a stroke, it is important to seek treatment as soon as possible. You may need a medicine to dissolve a blood clot (thrombolytic) if that is the cause of the TIA. This medicine cannot be given if too much time has passed. Treatment may also include:  °· Rest, oxygen, fluids through an IV tube, and medicines to thin the blood (anticoagulants). °· Measures will be taken to prevent short-term and long-term complications, including infection from breathing foreign material into the lungs (aspiration pneumonia), blood clots in the legs, and falls. °· Procedures to either remove plaque in the carotid arteries or dilate carotid arteries that have narrowed due to plaque. Those procedures are: °¨ Carotid endarterectomy. °¨ Carotid angioplasty and stenting. °· Medicines and diet may be used to address diabetes, high blood pressure, and   other underlying risk factors. °HOME CARE INSTRUCTIONS  °· Take medicines only as directed by your health care provider. Follow the directions carefully. Medicines may be used to control risk factors for a stroke. Be sure you understand all your medicine instructions. °· You  may be told to take aspirin or the anticoagulant warfarin. Warfarin needs to be taken exactly as instructed. °¨ Taking too much or too little warfarin is dangerous. Too much warfarin increases the risk of bleeding. Too little warfarin continues to allow the risk for blood clots. While taking warfarin, you will need to have regular blood tests to measure your blood clotting time. A PT blood test measures how long it takes for blood to clot. Your PT is used to calculate another value called an INR. Your PT and INR help your health care provider to adjust your dose of warfarin. The dose can change for many reasons. It is critically important that you take warfarin exactly as prescribed. °¨ Many foods, especially foods high in vitamin K can interfere with warfarin and affect the PT and INR. Foods high in vitamin K include spinach, kale, broccoli, cabbage, collard and turnip greens, Brussels sprouts, peas, cauliflower, seaweed, and parsley, as well as beef and pork liver, green tea, and soybean oil. You should eat a consistent amount of foods high in vitamin K. Avoid major changes in your diet, or notify your health care provider before changing your diet. Arrange a visit with a dietitian to answer your questions. °¨ Many medicines can interfere with warfarin and affect the PT and INR. You must tell your health care provider about any and all medicines you take; this includes all vitamins and supplements. Be especially cautious with aspirin and anti-inflammatory medicines. Do not take or discontinue any prescribed or over-the-counter medicine except on the advice of your health care provider or pharmacist. °¨ Warfarin can have side effects, such as excessive bruising or bleeding. You will need to hold pressure over cuts for longer than usual. Your health care provider or pharmacist will discuss other potential side effects. °¨ Avoid sports or activities that may cause injury or bleeding. °¨ Be careful when shaving,  flossing your teeth, or handling sharp objects. °¨ Alcohol can change the body's ability to handle warfarin. It is best to avoid alcoholic drinks or consume only very small amounts while taking warfarin. Notify your health care provider if you change your alcohol intake. °¨ Notify your dentist or other health care providers before procedures. °· Eat a diet that includes 5 or more servings of fruits and vegetables each day. This may reduce the risk of stroke. Certain diets may be prescribed to address high blood pressure, high cholesterol, diabetes, or obesity. °¨ A diet low in sodium, saturated fat, trans fat, and cholesterol is recommended to manage high blood pressure. °¨ A diet low in saturated fat, trans fat, and cholesterol, and high in fiber may control cholesterol levels. °¨ A controlled-carbohydrate, controlled-sugar diet is recommended to manage diabetes. °¨ A reduced-calorie diet that is low in sodium, saturated fat, trans fat, and cholesterol is recommended to manage obesity. °· Maintain a healthy weight. °· Stay physically active. It is recommended that you get at least 30 minutes of activity on most or all days. °· Do not use any tobacco products, including cigarettes, chewing tobacco, or electronic cigarettes. If you need help quitting, ask your health care provider. °· Limit alcohol intake to no more than 1 drink per day for nonpregnant women and 2 drinks   per day for men. One drink equals 12 ounces of beer, 5 ounces of wine, or 1½ ounces of hard liquor. °· Do not abuse drugs. °· A safe home environment is important to reduce the risk of falls. Your health care provider may arrange for specialists to evaluate your home. Having grab bars in the bedroom and bathroom is often important. Your health care provider may arrange for equipment to be used at home, such as raised toilets and a seat for the shower. °· Follow all instructions for follow-up with your health care provider. This is very important.  This includes any referrals and lab tests. Proper follow-up can prevent a stroke or another TIA from occurring. °PREVENTION  °The risk of a TIA can be decreased by appropriately treating high blood pressure, high cholesterol, diabetes, heart disease, and obesity, and by quitting smoking, limiting alcohol, and staying physically active. °SEEK MEDICAL CARE IF: °· You have personality changes. °· You have difficulty swallowing. °· You are seeing double. °· You have dizziness. °· You have a fever. °SEEK IMMEDIATE MEDICAL CARE IF:  °Any of the following symptoms may represent a serious problem that is an emergency. Do not wait to see if the symptoms will go away. Get medical help right away. Call your local emergency services (911 in [place] Do not drive yourself to the hospital. °· You have sudden weakness or numbness of the face, arm, or leg, especially on one side of the body. °· You have sudden trouble walking or difficulty moving arms or legs. °· You have sudden confusion. °· You have trouble speaking (aphasia) or understanding. °· You have sudden trouble seeing in one or both eyes. °· You have a loss of balance or coordination. °· You have a sudden, severe headache with no known cause. °· You have new chest pain or an irregular heartbeat. °· You have a partial or total loss of consciousness. °MAKE SURE YOU:  °· Understand these instructions. °· Will watch your condition. °· Will get help right away if you are not doing well or get worse. °  °This information is not intended to replace advice given to you by your health care provider. Make sure you discuss any questions you have with your health care provider. °  °Document Released: 11/20/2004 Document Revised: 03/03/2014 Document Reviewed: 05/18/2013 °[employer] Interactive Patient Education ©2016 [employer] ° °

## 2015-04-10 NOTE — Progress Notes (Signed)
Reason for visit: TIA  Referring physician: [facility]  Paige Pena is a 75 y.o. female  History of present illness:  Paige Pena is a 75 year old right-handed black female with a history of a mild memory disturbance. The patient was seen in the emergency room on 03/20/2015 when she had sudden onset of left hand numbness and weakness that lasted about 3 minutes, with clearing. The patient had some residual tingling of the left hand but this too also resolved. The patient denied any headache, she denied any symptoms involving the face or the leg, she did not speak during the event, and she does not recall any visual field changes. The patient was admitted to the hospital, MRI of the brain did not show evidence of an acute stroke event. MRA of the head was unremarkable, and a 2-D echocardiogram and carotid Doppler study were unremarkable. The patient was to be on aspirin, but she had not been taking her aspirin on a regular basis prior to admission. She is now back on daily aspirin therapy. She also takes a cholesterol-lowering drug, Crestor. She had been seen in the emergency room on 03/16/2015 she bumped her head when she stooped over and came up and hit a cabinet. The patient was not rendered unconscious. The patient feels somewhat spacey cognitively since that time and somewhat sleepy and fatigued. The patient had a similar event when she hit her head several days prior to January 20. The patient comes to this office for an evaluation.  Past Medical History  Diagnosis Date  . Dysphonia   . Asthmatic bronchitis   . Chest pain, atypical   . Cerebrovascular disease   . Hypercholesteremia   . GERD (gastroesophageal reflux disease)   . Acute cystitis   . Esophageal stricture   . Gastroparesis   . Diverticulosis of colon   . IBS (irritable bowel syndrome)   . Hemorrhoids   . Urinary incontinence   . Hematuria, microscopic   . DJD (degenerative joint disease)   . Fibromyalgia   .  Anxiety   . Vitamin D deficiency   . RLS (restless legs syndrome)   . Aphasia     transsient aphasia Jan 2012  . Memory loss   . Memory deficits 02/15/2013  . TIA (transient ischemic attack)     Past Surgical History  Procedure Laterality Date  . Abdominal hysterectomy  1995  . Corneal transplant for keratonconus    . Right bunion surgery    . Left foot fracture with orif  07/2004    Dr. Sharol Given  . Eye surgery  2013    Family History  Problem Relation Age of Onset  . Breast cancer      aunt  . Diabetes      aunt,brother,uncle  . Kidney disease      father  . Hyperlipidemia Sister   . Diabetes Brother   . Hypertension Mother   . Anemia Mother   . Kidney disease Father   . Dementia Maternal Grandmother     Social history:  reports that she has never smoked. She has never used smokeless tobacco. She reports that she does not drink alcohol or use illicit drugs.  Medications:  Prior to Admission medications   Medication Sig Start Date End Date Taking? Authorizing Provider  Acetaminophen (TYLENOL) 325 MG CAPS Take 325 mg by mouth 2 (two) times daily. 03/26/15  Yes Carmen Dohmeier, MD  ALPRAZolam Duanne Moron) 0.5 MG tablet TAKE  1/2 -1 TABLET BY  MOUTH THREE TIMES DAILY AS NEEDED Patient taking differently: Take 0.5 mg by mouth 3 (three) times daily as needed for anxiety. TAKE  1/2 -1 TABLET BY MOUTH THREE TIMES DAILY AS NEEDED 05/23/13  Yes [name], MD  aspirin (ECOTRIN) 325 MG EC tablet Take 325 mg by mouth daily.     Yes Historical Provider, MD  azelastine (ASTELIN) 0.1 % nasal spray Place 2 sprays into both nostrils at bedtime as needed for rhinitis. Use in each nostril as directed 10/13/14  Yes Colon Branch, MD  dicyclomine (BENTYL) 20 MG tablet Take 1 tablet (20 mg total) by mouth 4 (four) times daily -  before meals and at bedtime. 02/27/15  Yes [name], MD  DULoxetine (CYMBALTA) 20 MG capsule TAKE ONE CAPSULE BY MOUTH DAILY 12/20/14  Yes [name], MD    fluticasone [facility]) 50 MCG/ACT nasal spray Place 2 sprays into both nostrils daily. Patient taking differently: Place 2 sprays into both nostrils daily as needed for allergies.  10/05/14  Yes [name], MD  Iron-Vitamins (GERITOL) LIQD Take 15 mLs by mouth daily.   Yes Historical Provider, MD  KRILL OIL OMEGA-3 PO Take 1 capsule by mouth daily.   Yes Historical Provider, MD  loratadine (CLARITIN) 10 MG tablet Take 1 tablet (10 mg total) by mouth daily. Patient taking differently: Take 10 mg by mouth daily as needed for allergies.  10/05/14  Yes [name], MD  omeprazole (PRILOSEC) 40 MG capsule TAKE ONE CAPSULE BY MOUTH DAILY 10/05/14  Yes [name], MD  ondansetron (ZOFRAN) 4 MG tablet Take 1 tablet (4 mg total) by mouth every 8 (eight) hours as needed for nausea or vomiting. 02/15/15  Yes [name], MD  OVER THE COUNTER MEDICATION Take 1 tablet by mouth 3 (three) times daily. Pt takes domperidone that she gets mail order from [place]   Yes Historical Provider, MD  OVER THE COUNTER MEDICATION Take 1 tablet by mouth at bedtime as needed (sleep). Sonipure for sleep.   Yes Historical Provider, MD  prednisoLONE acetate (PRED FORTE) 1 % ophthalmic suspension Place 1 drop into both eyes daily.  12/22/12  Yes Historical Provider, MD  ranitidine (ZANTAC) 300 MG tablet Take 1 tablet (300 mg total) by mouth at bedtime. 02/15/15  Yes [name], MD  rosuvastatin (CRESTOR) 5 MG tablet Take 1 tablet by mouth three days each week. Patient taking differently: Take 5 mg by mouth 2 (two) times a week. Tuesday and Saturday 06/20/14  Yes Josue Hector, MD  sucralfate (CARAFATE) 1 G tablet Take 1 tablet (1 g total) by mouth 4 (four) times daily -  with meals and at bedtime. 02/15/15  Yes [name], MD  tolterodine (DETROL LA) 4 MG 24 hr capsule Take 1 capsule (4 mg total) by mouth at bedtime. 10/05/14  Yes [name], MD      Allergies  Allergen Reactions   . Morphine Other (See Comments)    REACTION: lethargy/malaise  . Metoclopramide Hcl Anxiety and Other (See Comments)    REACTION: anxious and out of her mind  . Penicillins Itching and Rash    Has patient had a PCN reaction causing immediate rash, facial/tongue/throat swelling, SOB or lightheadedness with hypotension: No Has patient had a PCN reaction causing severe rash involving mucus membranes or skin necrosis: No Has patient had a PCN reaction that required hospitalization No Has patient had a PCN reaction occurring within the last  10 years: Yes If all of the above answers are "NO", then may proceed with Cephalosporin use.    . Prednisone Itching and Rash  . Sulfonamide Derivatives Itching and Rash    ROS:  Out of a complete 14 system review of symptoms, the patient complains only of the following symptoms, and all other reviewed systems are negative.  Fatigue Ringing in the ears, difficulty swallowing Chest tightness Abdominal pain, incontinence of bowels Daytime sleepiness Incontinence of bladder, frequency of urination, urinary urgency Dizziness, numbness, left-sided weakness Agitation, anxiety  Blood pressure 147/82, pulse 97, height 5\' 1"  (1.549 m), weight 132 lb (59.875 kg).  Physical Exam  General: The patient is alert and cooperative at the time of the examination.  Eyes: Pupils are equal, round, and reactive to light. Discs are flat bilaterally.  Neck: The neck is supple, no carotid bruits are noted.  Respiratory: The respiratory examination is clear.  Cardiovascular: The cardiovascular examination reveals a regular rate and rhythm, no obvious murmurs or rubs are noted.  Skin: Extremities are without significant edema.  Neurologic Exam  Mental status: The patient is alert and oriented x 3 at the time of the examination. The patient has apparent normal recent and remote memory, with an apparently normal attention span and concentration ability.  Cranial  nerves: Facial symmetry is present. There is good sensation of the face to pinprick and soft touch bilaterally. The strength of the facial muscles and the muscles to head turning and shoulder shrug are normal bilaterally. Speech is well enunciated, no aphasia or dysarthria is noted. Extraocular movements are full. Visual fields are full. The tongue is midline, and the patient has symmetric elevation of the soft palate. No obvious hearing deficits are noted.  Motor: The motor testing reveals 5 over 5 strength of all 4 extremities. Good symmetric motor tone is noted throughout.  Sensory: Sensory testing is intact to pinprick, soft touch, vibration sensation, and position sense on all 4 extremities. No evidence of extinction is noted.  Coordination: Cerebellar testing reveals good finger-nose-finger and heel-to-shin bilaterally.  Gait and station: Gait is normal. Tandem gait is normal. Romberg is negative. No drift is seen.  Reflexes: Deep tendon reflexes are symmetric and normal bilaterally. Toes are downgoing bilaterally.   MRI brain 03/20/15:  IMPRESSION: 1. No acute intracranial infarct or other process identified. 2. Mild chronic small vessel ischemic disease with multiple scattered remote cerebellar infarcts bilaterally.  * MRI scan images were reviewed online. I agree with the written report.   MRA head 03/20/15:  IMPRESSION: 1. No large or proximal arterial branch occlusion. No hemodynamically significant or correctable stenosis. 2. Dolichoectasia of the internal carotid arteries, more prominent left. Finding is stable relative to previous MRA.  * MRA scan images were reviewed online. I agree with the written report.   Carotid doppler 03/21/15:  Vascular Ultrasound Carotid Duplex (Doppler) has been completed.  Findings suggest 1-39% internal carotid artery stenosis bilaterally. Vertebral arteries are patent with antegrade flow.   2D echo 03/22/15:  Study  Conclusions  - Left ventricle: The cavity size was normal. Wall thickness was increased in a pattern of mild LVH. Systolic function was normal. The estimated ejection fraction was in the range of 60% to 65%. Wall motion was normal; there were no regional wall motion abnormalities. Doppler parameters are consistent with abnormal left ventricular relaxation (grade 1 diastolic dysfunction). - Mitral valve: There was mild regurgitation. - Left atrium: The atrium was mildly dilated.     Assessment/Plan:  1. TIA event, left hand and arm weakness  2. Mild memory disturbance  3. Possible mild concussion  The patient is doing better at this time. She has no long-term residual from the event of left hand or arm weakness. The patient will remain on aspirin. The patient is not on Axona currently for memory, the patient may try coconut oil. The Axona caused diarrhea. The patient will follow-up in 6 months, we will follow the memory issue. The treatment for the mild concussion is conservative, the patient will back off of cognitive activities such as reading, and try to get plenty of rest.  C. Floyde Parkins MD 04/10/2015 2:42 PM  [facility] [address] [place], [place] [address]  Phone [phone] Fax [phone]

## 2015-04-11 ENCOUNTER — Ambulatory Visit: Payer: Self-pay | Admitting: Family Medicine

## 2015-04-12 ENCOUNTER — Ambulatory Visit: Payer: Medicare Other | Admitting: Physical Therapy

## 2015-04-17 ENCOUNTER — Encounter: Payer: Self-pay | Admitting: Physical Therapy

## 2015-04-17 ENCOUNTER — Ambulatory Visit: Payer: Medicare Other | Admitting: Physical Therapy

## 2015-04-17 DIAGNOSIS — G459 Transient cerebral ischemic attack, unspecified: Secondary | ICD-10-CM | POA: Diagnosis not present

## 2015-04-17 DIAGNOSIS — R531 Weakness: Secondary | ICD-10-CM

## 2015-04-17 DIAGNOSIS — R262 Difficulty in walking, not elsewhere classified: Secondary | ICD-10-CM

## 2015-04-17 NOTE — Therapy (Signed)
[place], [place], [address] Phone: [phone]   Fax:  [phone]  Physical Therapy Treatment  Patient Details  Name: Paige Pena MRN: [id number] Date of Birth: 11/30/1940 Referring Provider: [name]  Encounter Date: 04/17/2015      PT End of Session - 04/17/15 1256    Visit Number 2   Date for PT Re-Evaluation 06/01/15   PT Start Time 1230   PT Stop Time 1310   PT Time Calculation (min) 40 min      Past Medical History  Diagnosis Date  . Dysphonia   . Asthmatic bronchitis   . Chest pain, atypical   . Cerebrovascular disease   . Hypercholesteremia   . GERD (gastroesophageal reflux disease)   . Acute cystitis   . Esophageal stricture   . Gastroparesis   . Diverticulosis of colon   . IBS (irritable bowel syndrome)   . Hemorrhoids   . Urinary incontinence   . Hematuria, microscopic   . DJD (degenerative joint disease)   . Fibromyalgia   . Anxiety   . Vitamin D deficiency   . RLS (restless legs syndrome)   . Aphasia     transsient aphasia Jan 2012  . Memory loss   . Memory deficits 02/15/2013  . TIA (transient ischemic attack)     Past Surgical History  Procedure Laterality Date  . Abdominal hysterectomy  1995  . Corneal transplant for keratonconus    . Right bunion surgery    . Left foot fracture with orif  07/2004    Dr. Sharol Given  . Eye surgery  2013    There were no vitals filed for this visit.  Visit Diagnosis:  Difficulty walking  Left-sided weakness      Subjective Assessment - 04/17/15 1238    Subjective was not feeling well so did not do much of HEP,I think I need ot come more than 1 time a week for PT   Currently in Pain? No/denies                         [facility] Adult PT Treatment/Exercise - 04/17/15 0001    High Level Balance   High Level Balance Activities Backward walking;Side stepping;Marching forwards;Tandem walking  al with HHA  and all but tandem with 3# ankle weights.   Knee/Hip Exercises: Aerobic   Nustep L 5 6 min   Knee/Hip Exercises: Machines for Strengthening   Cybex Knee Extension 10# 2 sets 10   Cybex Knee Flexion 20# 2 sets 10   Knee/Hip Exercises: Standing   Other Standing Knee Exercises standing on airex 3# 10 reps marching,hip flex,ext and abd   Other Standing Knee Exercises 3# 8 inch alt step tap    Knee/Hip Exercises: Seated   Sit to Sand 1 set;10 reps;without UE support  wt ball                  PT Short Term Goals - 04/03/15 1403    PT SHORT TERM GOAL #1   Title Pt will be independent with HEP   Time 1   Period Weeks   Status New           PT Long Term Goals - 04/03/15 1404    PT LONG TERM GOAL #1   Title Pt will decrease TUG time to 10 seconds to decrease fall risk.   Time 4   Period Weeks  Status New   PT LONG TERM GOAL #2   Title Pt will ambulate at least 600 feet independently with no deviation and minimal loss of balance.    Time 4   Period Weeks   Status New   PT LONG TERM GOAL #3   Title Pt will demonstrate ability to tandem walk for at least 15 feet with minimal loss of balance.    Time 4   Period Weeks   Status New   PT LONG TERM GOAL #4   Title Pt will feel comfortable shopping independently.   Time 4   Period Weeks   Status New               Plan - 04/17/15 1256    Clinical Impression Statement pt tolerated therapy very well, fatigued easily and most difficulty with SLS for tapping andtandem.   PT Next Visit Plan progress balance and hip strengthening exercises        Problem List Patient Active Problem List   Diagnosis Date Noted  . Numbness and tingling in left hand 03/21/2015  . TIA (transient ischemic attack) 03/21/2015  . UTI (lower urinary tract infection) 03/21/2015  . Physical exam 10/05/2014  . Abdominal pain 05/17/2014  . Atypical chest pain 05/17/2014  . Allergic rhinitis 05/17/2014  . Fatigue 04/04/2014  . Anxiety  and depression 07/25/2013  . Weight loss, unintentional 07/25/2013  . Sinusitis, acute, maxillary 07/13/2013  . Cerumen impaction 07/13/2013  . Memory deficits 02/15/2013  . Lumbar spondylosis 09/21/2012  . Single episode of elevated blood pressure 09/29/2011  . Palpitations 09/16/2010  . Left leg pain 07/04/2010  . OTHER ABNORMALITY OF URINATION 09/25/2009  . DERMATITIS 03/23/2009  . Cerebrovascular disease 04/03/2008  . DYSPHONIA 04/03/2008  . CHEST PAIN, ATYPICAL 04/03/2008  . ASTHMATIC BRONCHITIS, ACUTE 04/02/2008  . DIVERTICULOSIS OF COLON 04/02/2008  . Irritable bowel syndrome 04/02/2008  . DEGENERATIVE JOINT DISEASE 04/02/2008  . HEMATURIA, MICROSCOPIC, HX OF 04/02/2008  . Vitamin D deficiency 03/28/2008  . HYPERCHOLESTEROLEMIA 03/28/2008  . FIBROMYALGIA 03/28/2008  . URINARY INCONTINENCE 03/28/2008  . ACUTE CYSTITIS 03/22/2008  . GERD 08/02/2007  . Gastroparesis 08/02/2007  . HEMORRHOIDS 07/30/2007  . ESOPHAGEAL STRICTURE 07/30/2007    Tasia Liz,ANGIE PTA 04/17/2015, 12:58 PM  [place], [place], [address] Phone: [phone]   Fax:  [phone]  Name: Paige Pena MRN: [id number] Date of Birth: 06/18/1940

## 2015-04-19 ENCOUNTER — Ambulatory Visit: Payer: Medicare Other | Admitting: Physical Therapy

## 2015-04-24 ENCOUNTER — Encounter: Payer: Self-pay | Admitting: Physical Therapy

## 2015-04-24 ENCOUNTER — Ambulatory Visit: Payer: Medicare Other | Admitting: Physical Therapy

## 2015-04-24 DIAGNOSIS — R262 Difficulty in walking, not elsewhere classified: Secondary | ICD-10-CM

## 2015-04-24 DIAGNOSIS — R531 Weakness: Secondary | ICD-10-CM

## 2015-04-24 DIAGNOSIS — G459 Transient cerebral ischemic attack, unspecified: Secondary | ICD-10-CM | POA: Diagnosis not present

## 2015-04-24 NOTE — Therapy (Signed)
[place], [place], [address] Phone: [phone]   Fax:  [phone]  Physical Therapy Treatment  Patient Details  Name: Paige Pena MRN: [id number] Date of Birth: 06/12/1940 Referring Provider: [name]  Encounter Date: 04/24/2015      PT End of Session - 04/24/15 1342    Visit Number 3   Date for PT Re-Evaluation 06/01/15   PT Start Time 1300   PT Stop Time 1342   PT Time Calculation (min) 42 min   Activity Tolerance Patient tolerated treatment well   Behavior During Therapy [facility] for tasks assessed/performed      Past Medical History  Diagnosis Date  . Dysphonia   . Asthmatic bronchitis   . Chest pain, atypical   . Cerebrovascular disease   . Hypercholesteremia   . GERD (gastroesophageal reflux disease)   . Acute cystitis   . Esophageal stricture   . Gastroparesis   . Diverticulosis of colon   . IBS (irritable bowel syndrome)   . Hemorrhoids   . Urinary incontinence   . Hematuria, microscopic   . DJD (degenerative joint disease)   . Fibromyalgia   . Anxiety   . Vitamin D deficiency   . RLS (restless legs syndrome)   . Aphasia     transsient aphasia Jan 2012  . Memory loss   . Memory deficits 02/15/2013  . TIA (transient ischemic attack)     Past Surgical History  Procedure Laterality Date  . Abdominal hysterectomy  1995  . Corneal transplant for keratonconus    . Right bunion surgery    . Left foot fracture with orif  07/2004    Dr. Sharol Given  . Eye surgery  2013    There were no vitals filed for this visit.  Visit Diagnosis:  Difficulty walking  Left-sided weakness  Transient cerebral ischemia, unspecified transient cerebral ischemia type      Subjective Assessment - 04/24/15 1306    Subjective "Im been pretty good, I keep wanting to lean to the left" "No real pain just weakness"   Currently in Pain? No/denies   Multiple Pain Sites No                          [facility] Adult PT Treatment/Exercise - 04/24/15 0001    Knee/Hip Exercises: Aerobic   Nustep L 5 6 min   Knee/Hip Exercises: Machines for Strengthening   Cybex Knee Extension 10# 2 sets 15   Cybex Knee Flexion 20# 2 sets 15   Knee/Hip Exercises: Standing   Other Standing Knee Exercises standing on airex 3# 10 reps marching,hip flex,ext and abd   Other Standing Knee Exercises 3# 8 inch step tap 2x10     Knee/Hip Exercises: Seated   Sit to Sand 10 reps;without UE support;2 sets  with yellow weighted ball                   PT Short Term Goals - 04/03/15 1403    PT SHORT TERM GOAL #1   Title Pt will be independent with HEP   Time 1   Period Weeks   Status New           PT Long Term Goals - 04/03/15 1404    PT LONG TERM GOAL #1   Title Pt will decrease TUG time to 10 seconds to decrease fall risk.   Time 4   Period Weeks  Status New   PT LONG TERM GOAL #2   Title Pt will ambulate at least 600 feet independently with no deviation and minimal loss of balance.    Time 4   Period Weeks   Status New   PT LONG TERM GOAL #3   Title Pt will demonstrate ability to tandem walk for at least 15 feet with minimal loss of balance.    Time 4   Period Weeks   Status New   PT LONG TERM GOAL #4   Title Pt will feel comfortable shopping independently.   Time 4   Period Weeks   Status New               Plan - 04/24/15 1342    Clinical Impression Statement Continues to fatigue easily with resistive interventions. Completed all exercises no c/o of increase pain.    Pt will benefit from skilled therapeutic intervention in order to improve on the following deficits Decreased balance;Decreased strength;Difficulty walking   Rehab Potential Good   PT Frequency 2x / week   PT Duration 4 weeks   PT Treatment/Interventions ADLs/Self Care Home Management;Electrical Stimulation;Moist Heat;Therapeutic exercise;Therapeutic activities;Gait  training;Balance training;Neuromuscular re-education;Patient/family education;Manual techniques;Energy conservation   PT Next Visit Plan progress balance and hip strengthening exercises.        Problem List Patient Active Problem List   Diagnosis Date Noted  . Numbness and tingling in left hand 03/21/2015  . TIA (transient ischemic attack) 03/21/2015  . UTI (lower urinary tract infection) 03/21/2015  . Physical exam 10/05/2014  . Abdominal pain 05/17/2014  . Atypical chest pain 05/17/2014  . Allergic rhinitis 05/17/2014  . Fatigue 04/04/2014  . Anxiety and depression 07/25/2013  . Weight loss, unintentional 07/25/2013  . Sinusitis, acute, maxillary 07/13/2013  . Cerumen impaction 07/13/2013  . Memory deficits 02/15/2013  . Lumbar spondylosis 09/21/2012  . Single episode of elevated blood pressure 09/29/2011  . Palpitations 09/16/2010  . Left leg pain 07/04/2010  . OTHER ABNORMALITY OF URINATION 09/25/2009  . DERMATITIS 03/23/2009  . Cerebrovascular disease 04/03/2008  . DYSPHONIA 04/03/2008  . CHEST PAIN, ATYPICAL 04/03/2008  . ASTHMATIC BRONCHITIS, ACUTE 04/02/2008  . DIVERTICULOSIS OF COLON 04/02/2008  . Irritable bowel syndrome 04/02/2008  . DEGENERATIVE JOINT DISEASE 04/02/2008  . HEMATURIA, MICROSCOPIC, HX OF 04/02/2008  . Vitamin D deficiency 03/28/2008  . HYPERCHOLESTEROLEMIA 03/28/2008  . FIBROMYALGIA 03/28/2008  . URINARY INCONTINENCE 03/28/2008  . ACUTE CYSTITIS 03/22/2008  . GERD 08/02/2007  . Gastroparesis 08/02/2007  . HEMORRHOIDS 07/30/2007  . ESOPHAGEAL STRICTURE 07/30/2007    Scot Jun, PTA  04/24/2015, 1:44 PM  [place], [place], [address] Phone: [phone]   Fax:  [phone]  Name: Paige Pena MRN: [id number] Date of Birth: [birth year]

## 2015-04-26 ENCOUNTER — Encounter: Payer: Self-pay | Admitting: Physical Therapy

## 2015-04-26 ENCOUNTER — Ambulatory Visit: Payer: Medicare Other | Attending: Family Medicine | Admitting: Physical Therapy

## 2015-04-26 DIAGNOSIS — R531 Weakness: Secondary | ICD-10-CM

## 2015-04-26 DIAGNOSIS — G459 Transient cerebral ischemic attack, unspecified: Secondary | ICD-10-CM

## 2015-04-26 DIAGNOSIS — M6289 Other specified disorders of muscle: Secondary | ICD-10-CM | POA: Insufficient documentation

## 2015-04-26 DIAGNOSIS — R262 Difficulty in walking, not elsewhere classified: Secondary | ICD-10-CM

## 2015-04-26 NOTE — Therapy (Signed)
[place], [place], [address] Phone: [phone]   Fax:  [phone]  Physical Therapy Treatment  Patient Details  Name: Paige Pena MRN: [id number] Date of Birth: [birth year] Referring Provider: [name]  Encounter Date: 04/26/2015      PT End of Session - 04/26/15 1428    Visit Number 4   PT Start Time 7782   PT Stop Time 1429   PT Time Calculation (min) 31 min      Past Medical History  Diagnosis Date  . Dysphonia   . Asthmatic bronchitis   . Chest pain, atypical   . Cerebrovascular disease   . Hypercholesteremia   . GERD (gastroesophageal reflux disease)   . Acute cystitis   . Esophageal stricture   . Gastroparesis   . Diverticulosis of colon   . IBS (irritable bowel syndrome)   . Hemorrhoids   . Urinary incontinence   . Hematuria, microscopic   . DJD (degenerative joint disease)   . Fibromyalgia   . Anxiety   . Vitamin D deficiency   . RLS (restless legs syndrome)   . Aphasia     transsient aphasia Jan 2012  . Memory loss   . Memory deficits 02/15/2013  . TIA (transient ischemic attack)     Past Surgical History  Procedure Laterality Date  . Abdominal hysterectomy  1995  . Corneal transplant for keratonconus    . Right bunion surgery    . Left foot fracture with orif  07/2004    Dr. Sharol Given  . Eye surgery  2013    There were no vitals filed for this visit.  Visit Diagnosis:  Difficulty walking  Left-sided weakness  Transient cerebral ischemia, unspecified transient cerebral ischemia type      Subjective Assessment - 04/26/15 1357    Subjective "Pretty good"   Currently in Pain? No/denies   Multiple Pain Sites No            [facility] PT Assessment - 04/26/15 0001    Balance   Balance Assessed Yes   Standardized Balance Assessment   Standardized Balance Assessment Timed Up and Go Test   Timed Up and Go Test   TUG Normal TUG   Normal TUG (seconds) 8.35                      [facility] Adult PT Treatment/Exercise - 04/26/15 0001    High Level Balance   High Level Balance Activities Side stepping;Backward walking;Tandem walking  minor balance issues with Tandem walking.    Knee/Hip Exercises: Machines for Strengthening   Cybex Knee Extension 10# 2 sets 15   Cybex Knee Flexion 25# 2 sets 15   Knee/Hip Exercises: Standing   Other Standing Knee Exercises standing on airex 3x10   Knee/Hip Exercises: Seated   Sit to Sand 10 reps;without UE support;2 sets  2 sets with blue ball                   PT Short Term Goals - 04/03/15 1403    PT SHORT TERM GOAL #1   Title Pt will be independent with HEP   Time 1   Period Weeks   Status New           PT Long Term Goals - 04/26/15 1406    PT LONG TERM GOAL #1   Title Pt will decrease TUG time to 10 seconds to decrease fall risk.  Status Achieved               Plan - 04/26/15 1429    Clinical Impression Statement Pt 13 minutes late for today's PT treatment. Pt has progressed and met some goals, demos good strength with machine level interventions. Pt does have issues with tandem walking. Cues needed to increase step length with backwards walking.   Pt will benefit from skilled therapeutic intervention in order to improve on the following deficits Decreased balance;Decreased strength;Difficulty walking   Rehab Potential Good   PT Frequency 2x / week   PT Duration 4 weeks   PT Treatment/Interventions ADLs/Self Care Home Management;Electrical Stimulation;Moist Heat;Therapeutic exercise;Therapeutic activities;Gait training;Balance training;Neuromuscular re-education;Patient/family education;Manual techniques;Energy conservation   PT Next Visit Plan progress balance and hip strengthening exercises.        Problem List Patient Active Problem List   Diagnosis Date Noted  . Numbness and tingling in left hand 03/21/2015  . TIA (transient ischemic attack) 03/21/2015   . UTI (lower urinary tract infection) 03/21/2015  . Physical exam 10/05/2014  . Abdominal pain 05/17/2014  . Atypical chest pain 05/17/2014  . Allergic rhinitis 05/17/2014  . Fatigue 04/04/2014  . Anxiety and depression 07/25/2013  . Weight loss, unintentional 07/25/2013  . Sinusitis, acute, maxillary 07/13/2013  . Cerumen impaction 07/13/2013  . Memory deficits 02/15/2013  . Lumbar spondylosis 09/21/2012  . Single episode of elevated blood pressure 09/29/2011  . Palpitations 09/16/2010  . Left leg pain 07/04/2010  . OTHER ABNORMALITY OF URINATION 09/25/2009  . DERMATITIS 03/23/2009  . Cerebrovascular disease 04/03/2008  . DYSPHONIA 04/03/2008  . CHEST PAIN, ATYPICAL 04/03/2008  . ASTHMATIC BRONCHITIS, ACUTE 04/02/2008  . DIVERTICULOSIS OF COLON 04/02/2008  . Irritable bowel syndrome 04/02/2008  . DEGENERATIVE JOINT DISEASE 04/02/2008  . HEMATURIA, MICROSCOPIC, HX OF 04/02/2008  . Vitamin D deficiency 03/28/2008  . HYPERCHOLESTEROLEMIA 03/28/2008  . FIBROMYALGIA 03/28/2008  . URINARY INCONTINENCE 03/28/2008  . ACUTE CYSTITIS 03/22/2008  . GERD 08/02/2007  . Gastroparesis 08/02/2007  . HEMORRHOIDS 07/30/2007  . ESOPHAGEAL STRICTURE 07/30/2007    Scot Jun, PTA  04/26/2015, 2:31 PM  [place], [place], [address] Phone: [phone]   Fax:  [phone]  Name: AICHA CLINGENPEEL MRN: [id number] Date of Birth: 1940-06-13

## 2015-05-01 ENCOUNTER — Ambulatory Visit: Payer: Medicare Other | Admitting: Physical Therapy

## 2015-05-01 ENCOUNTER — Encounter: Payer: Self-pay | Admitting: Physical Therapy

## 2015-05-01 DIAGNOSIS — G459 Transient cerebral ischemic attack, unspecified: Secondary | ICD-10-CM

## 2015-05-01 DIAGNOSIS — R262 Difficulty in walking, not elsewhere classified: Secondary | ICD-10-CM | POA: Diagnosis not present

## 2015-05-01 DIAGNOSIS — R531 Weakness: Secondary | ICD-10-CM

## 2015-05-01 NOTE — Therapy (Signed)
[place], [place], [address] Phone: [phone]   Fax:  [phone]  Physical Therapy Treatment  Patient Details  Name: Paige Pena MRN: [id number] Date of Birth: 18-May-1940 Referring Provider: [name]  Encounter Date: 05/01/2015      PT End of Session - 05/01/15 1428    Visit Number 5   Date for PT Re-Evaluation 06/01/15   PT Start Time [id number]   PT Stop Time 1431   PT Time Calculation (min) 39 min      Past Medical History  Diagnosis Date  . Dysphonia   . Asthmatic bronchitis   . Chest pain, atypical   . Cerebrovascular disease   . Hypercholesteremia   . GERD (gastroesophageal reflux disease)   . Acute cystitis   . Esophageal stricture   . Gastroparesis   . Diverticulosis of colon   . IBS (irritable bowel syndrome)   . Hemorrhoids   . Urinary incontinence   . Hematuria, microscopic   . DJD (degenerative joint disease)   . Fibromyalgia   . Anxiety   . Vitamin D deficiency   . RLS (restless legs syndrome)   . Aphasia     transsient aphasia Jan 2012  . Memory loss   . Memory deficits 02/15/2013  . TIA (transient ischemic attack)     Past Surgical History  Procedure Laterality Date  . Abdominal hysterectomy  1995  . Corneal transplant for keratonconus    . Right bunion surgery    . Left foot fracture with orif  07/2004    Dr. Sharol Given  . Eye surgery  2013    There were no vitals filed for this visit.  Visit Diagnosis:  Difficulty walking  Left-sided weakness  Transient cerebral ischemia, unspecified transient cerebral ischemia type      Subjective Assessment - 05/01/15 1354    Subjective "No real pain just feel fatigued"   Currently in Pain? No/denies   Multiple Pain Sites No                         [facility] Adult PT Treatment/Exercise - 05/01/15 0001    High Level Balance   High Level Balance Activities Side stepping;Backward walking;Tandem walking    Exercises   Exercises Lumbar   Lumbar Exercises: Machines for Strengthening   Cybex Knee Extension 10# 2 sets 15   Cybex Knee Flexion 25# 2 sets 15   Lumbar Exercises: Seated   Other Seated Lumbar Exercises seated trunk rotation yellow ball 2x10; seated OHP yellow ball 2x10   Knee/Hip Exercises: Aerobic   Nustep L 4 6 min   Knee/Hip Exercises: Standing   Other Standing Knee Exercises Sports cord walking #20 3 reps 4 way   Knee/Hip Exercises: Seated   Sit to Sand 10 reps;without UE support;2 sets   Knee/Hip Exercises: Supine   Quad Sets --  yellow weighted ball                  PT Short Term Goals - 04/03/15 1403    PT SHORT TERM GOAL #1   Title Pt will be independent with HEP   Time 1   Period Weeks   Status New           PT Long Term Goals - 04/26/15 1406    PT LONG TERM GOAL #1   Title Pt will decrease TUG time to 10 seconds to decrease fall risk.  Status Achieved               Plan - 05/01/15 1428    Clinical Impression Statement Pt 7 minutes late for PT session. "I sure did have a workout" Pt tolerated treatment well, demos better balance with tandem walking. Pt progressed to resisted walking minor balance and strength with resisted side steps.   Pt will benefit from skilled therapeutic intervention in order to improve on the following deficits Decreased balance;Decreased strength;Difficulty walking   Rehab Potential Good   PT Frequency 2x / week   PT Duration 4 weeks   PT Treatment/Interventions ADLs/Self Care Home Management;Electrical Stimulation;Moist Heat;Therapeutic exercise;Therapeutic activities;Gait training;Balance training;Neuromuscular re-education;Patient/family education;Manual techniques;Energy conservation   PT Next Visit Plan progress balance and hip strengthening exercises.        Problem List Patient Active Problem List   Diagnosis Date Noted  . Numbness and tingling in left hand 03/21/2015  . TIA (transient ischemic  attack) 03/21/2015  . UTI (lower urinary tract infection) 03/21/2015  . Physical exam 10/05/2014  . Abdominal pain 05/17/2014  . Atypical chest pain 05/17/2014  . Allergic rhinitis 05/17/2014  . Fatigue 04/04/2014  . Anxiety and depression 07/25/2013  . Weight loss, unintentional 07/25/2013  . Sinusitis, acute, maxillary 07/13/2013  . Cerumen impaction 07/13/2013  . Memory deficits 02/15/2013  . Lumbar spondylosis 09/21/2012  . Single episode of elevated blood pressure 09/29/2011  . Palpitations 09/16/2010  . Left leg pain 07/04/2010  . OTHER ABNORMALITY OF URINATION 09/25/2009  . DERMATITIS 03/23/2009  . Cerebrovascular disease 04/03/2008  . DYSPHONIA 04/03/2008  . CHEST PAIN, ATYPICAL 04/03/2008  . ASTHMATIC BRONCHITIS, ACUTE 04/02/2008  . DIVERTICULOSIS OF COLON 04/02/2008  . Irritable bowel syndrome 04/02/2008  . DEGENERATIVE JOINT DISEASE 04/02/2008  . HEMATURIA, MICROSCOPIC, HX OF 04/02/2008  . Vitamin D deficiency 03/28/2008  . HYPERCHOLESTEROLEMIA 03/28/2008  . FIBROMYALGIA 03/28/2008  . URINARY INCONTINENCE 03/28/2008  . ACUTE CYSTITIS 03/22/2008  . GERD 08/02/2007  . Gastroparesis 08/02/2007  . HEMORRHOIDS 07/30/2007  . ESOPHAGEAL STRICTURE 07/30/2007    Scot Jun, PTA  05/01/2015, 2:30 PM  [place], [place], [address] Phone: [phone]   Fax:  [phone]  Name: Paige Pena MRN: [id number] Date of Birth: 1940-05-19

## 2015-05-03 ENCOUNTER — Ambulatory Visit: Payer: Medicare Other | Admitting: Physical Therapy

## 2015-05-03 ENCOUNTER — Encounter: Payer: Self-pay | Admitting: Physical Therapy

## 2015-05-03 ENCOUNTER — Other Ambulatory Visit: Payer: Self-pay | Admitting: Family Medicine

## 2015-05-03 DIAGNOSIS — R262 Difficulty in walking, not elsewhere classified: Secondary | ICD-10-CM | POA: Diagnosis not present

## 2015-05-03 DIAGNOSIS — R531 Weakness: Secondary | ICD-10-CM

## 2015-05-03 DIAGNOSIS — G459 Transient cerebral ischemic attack, unspecified: Secondary | ICD-10-CM

## 2015-05-03 NOTE — Therapy (Signed)
[place], [place], [address] Phone: [phone]   Fax:  [phone]  Physical Therapy Treatment  Patient Details  Name: Paige Pena MRN: [id number] Date of Birth: 1940-12-04 Referring Provider: [name]  Encounter Date: 05/03/2015      PT End of Session - 05/03/15 1426    Visit Number 6   Date for PT Re-Evaluation 06/01/15   PT Start Time 1353   PT Stop Time 1427   PT Time Calculation (min) 34 min   Activity Tolerance Patient tolerated treatment well      Past Medical History  Diagnosis Date  . Dysphonia   . Asthmatic bronchitis   . Chest pain, atypical   . Cerebrovascular disease   . Hypercholesteremia   . GERD (gastroesophageal reflux disease)   . Acute cystitis   . Esophageal stricture   . Gastroparesis   . Diverticulosis of colon   . IBS (irritable bowel syndrome)   . Hemorrhoids   . Urinary incontinence   . Hematuria, microscopic   . DJD (degenerative joint disease)   . Fibromyalgia   . Anxiety   . Vitamin D deficiency   . RLS (restless legs syndrome)   . Aphasia     transsient aphasia Jan 2012  . Memory loss   . Memory deficits 02/15/2013  . TIA (transient ischemic attack)     Past Surgical History  Procedure Laterality Date  . Abdominal hysterectomy  1995  . Corneal transplant for keratonconus    . Right bunion surgery    . Left foot fracture with orif  07/2004    Dr. Sharol Given  . Eye surgery  2013    There were no vitals filed for this visit.  Visit Diagnosis:  Difficulty walking  Left-sided weakness  Transient cerebral ischemia, unspecified transient cerebral ischemia type      Subjective Assessment - 05/03/15 1354    Subjective "My left side, It just seem Im pulling to the L "   Currently in Pain? No/denies   Multiple Pain Sites No                         [facility] Adult PT Treatment/Exercise - 05/03/15 0001    Ambulation/Gait   Ambulation/Gait Yes   Ambulation/Gait Assistance 7: Independent   Ambulation Distance (Feet) 250 Feet   Assistive device None   Gait Pattern Step-through pattern   Ambulation Surface Paved;Outdoor;Unlevel   Stairs Yes   Stair Management Technique No rails;One rail Right;One rail Left   Number of Stairs 24   Height of Stairs 6   Gait Comments stairs X2, mild occurences of decrease L hip flexion when ascending stairs.   Knee/Hip Exercises: Aerobic   Nustep L 4 6 min   Knee/Hip Exercises: Machines for Strengthening   Cybex Knee Extension 10# 2 sets 15   Cybex Knee Flexion 25# 2 sets 15   Cybex Leg Press #20 2x10    Knee/Hip Exercises: Seated   Sit to Sand 10 reps;without UE support;2 sets  blue weighted ball.                  PT Short Term Goals - 04/03/15 1403    PT SHORT TERM GOAL #1   Title Pt will be independent with HEP   Time 1   Period Weeks   Status New           PT Long Term  Goals - 04/26/15 1406    PT LONG TERM GOAL #1   Title Pt will decrease TUG time to 10 seconds to decrease fall risk.   Status Achieved               Plan - 05/03/15 1427    Clinical Impression Statement 8 minutes late for today's treatment session. No reports of pain. progressed to leg press, stair negotiation, and walking on uneven surfaces. Some issues with foot clearance with LLE when assecding stairs.   Pt will benefit from skilled therapeutic intervention in order to improve on the following deficits Decreased balance;Decreased strength;Difficulty walking   Rehab Potential Good   PT Frequency 2x / week   PT Duration 4 weeks   PT Treatment/Interventions ADLs/Self Care Home Management;Electrical Stimulation;Moist Heat;Therapeutic exercise;Therapeutic activities;Gait training;Balance training;Neuromuscular re-education;Patient/family education;Manual techniques;Energy conservation   PT Next Visit Plan functional strengthening        Problem List Patient Active  Problem List   Diagnosis Date Noted  . Numbness and tingling in left hand 03/21/2015  . TIA (transient ischemic attack) 03/21/2015  . UTI (lower urinary tract infection) 03/21/2015  . Physical exam 10/05/2014  . Abdominal pain 05/17/2014  . Atypical chest pain 05/17/2014  . Allergic rhinitis 05/17/2014  . Fatigue 04/04/2014  . Anxiety and depression 07/25/2013  . Weight loss, unintentional 07/25/2013  . Sinusitis, acute, maxillary 07/13/2013  . Cerumen impaction 07/13/2013  . Memory deficits 02/15/2013  . Lumbar spondylosis 09/21/2012  . Single episode of elevated blood pressure 09/29/2011  . Palpitations 09/16/2010  . Left leg pain 07/04/2010  . OTHER ABNORMALITY OF URINATION 09/25/2009  . DERMATITIS 03/23/2009  . Cerebrovascular disease 04/03/2008  . DYSPHONIA 04/03/2008  . CHEST PAIN, ATYPICAL 04/03/2008  . ASTHMATIC BRONCHITIS, ACUTE 04/02/2008  . DIVERTICULOSIS OF COLON 04/02/2008  . Irritable bowel syndrome 04/02/2008  . DEGENERATIVE JOINT DISEASE 04/02/2008  . HEMATURIA, MICROSCOPIC, HX OF 04/02/2008  . Vitamin D deficiency 03/28/2008  . HYPERCHOLESTEROLEMIA 03/28/2008  . FIBROMYALGIA 03/28/2008  . URINARY INCONTINENCE 03/28/2008  . ACUTE CYSTITIS 03/22/2008  . GERD 08/02/2007  . Gastroparesis 08/02/2007  . HEMORRHOIDS 07/30/2007  . ESOPHAGEAL STRICTURE 07/30/2007    Scot Jun, PTA   05/03/2015, 2:29 PM  [place], [place], [address] Phone: [phone]   Fax:  [phone]  Name: SAYRA FOCO MRN: [id number] Date of Birth: 03-26-1940

## 2015-05-03 NOTE — Telephone Encounter (Signed)
Medication filled to pharmacy as requested.   

## 2015-05-07 ENCOUNTER — Encounter (HOSPITAL_COMMUNITY): Payer: Self-pay | Admitting: Emergency Medicine

## 2015-05-07 DIAGNOSIS — I739 Peripheral vascular disease, unspecified: Secondary | ICD-10-CM | POA: Insufficient documentation

## 2015-05-07 DIAGNOSIS — Z885 Allergy status to narcotic agent status: Secondary | ICD-10-CM | POA: Diagnosis not present

## 2015-05-07 DIAGNOSIS — Z882 Allergy status to sulfonamides status: Secondary | ICD-10-CM | POA: Insufficient documentation

## 2015-05-07 DIAGNOSIS — Z888 Allergy status to other drugs, medicaments and biological substances status: Secondary | ICD-10-CM | POA: Diagnosis not present

## 2015-05-07 DIAGNOSIS — N3281 Overactive bladder: Secondary | ICD-10-CM | POA: Insufficient documentation

## 2015-05-07 DIAGNOSIS — M4682 Other specified inflammatory spondylopathies, cervical region: Secondary | ICD-10-CM | POA: Insufficient documentation

## 2015-05-07 DIAGNOSIS — K222 Esophageal obstruction: Secondary | ICD-10-CM | POA: Diagnosis not present

## 2015-05-07 DIAGNOSIS — R51 Headache: Secondary | ICD-10-CM | POA: Insufficient documentation

## 2015-05-07 DIAGNOSIS — G2581 Restless legs syndrome: Secondary | ICD-10-CM | POA: Diagnosis not present

## 2015-05-07 DIAGNOSIS — Z7982 Long term (current) use of aspirin: Secondary | ICD-10-CM | POA: Diagnosis not present

## 2015-05-07 DIAGNOSIS — J45909 Unspecified asthma, uncomplicated: Secondary | ICD-10-CM | POA: Diagnosis not present

## 2015-05-07 DIAGNOSIS — G459 Transient cerebral ischemic attack, unspecified: Principal | ICD-10-CM | POA: Insufficient documentation

## 2015-05-07 DIAGNOSIS — Z8719 Personal history of other diseases of the digestive system: Secondary | ICD-10-CM | POA: Diagnosis not present

## 2015-05-07 DIAGNOSIS — M1990 Unspecified osteoarthritis, unspecified site: Secondary | ICD-10-CM | POA: Insufficient documentation

## 2015-05-07 DIAGNOSIS — M4802 Spinal stenosis, cervical region: Secondary | ICD-10-CM | POA: Diagnosis not present

## 2015-05-07 DIAGNOSIS — E7800 Pure hypercholesterolemia, unspecified: Secondary | ICD-10-CM | POA: Diagnosis not present

## 2015-05-07 DIAGNOSIS — Z880 Allergy status to penicillin: Secondary | ICD-10-CM | POA: Insufficient documentation

## 2015-05-07 DIAGNOSIS — K589 Irritable bowel syndrome without diarrhea: Secondary | ICD-10-CM | POA: Insufficient documentation

## 2015-05-07 DIAGNOSIS — M47896 Other spondylosis, lumbar region: Secondary | ICD-10-CM | POA: Insufficient documentation

## 2015-05-07 DIAGNOSIS — M2578 Osteophyte, vertebrae: Secondary | ICD-10-CM | POA: Insufficient documentation

## 2015-05-07 DIAGNOSIS — F419 Anxiety disorder, unspecified: Secondary | ICD-10-CM | POA: Insufficient documentation

## 2015-05-07 DIAGNOSIS — M797 Fibromyalgia: Secondary | ICD-10-CM | POA: Insufficient documentation

## 2015-05-07 DIAGNOSIS — M50221 Other cervical disc displacement at C4-C5 level: Secondary | ICD-10-CM | POA: Insufficient documentation

## 2015-05-07 DIAGNOSIS — E559 Vitamin D deficiency, unspecified: Secondary | ICD-10-CM | POA: Insufficient documentation

## 2015-05-07 DIAGNOSIS — K219 Gastro-esophageal reflux disease without esophagitis: Secondary | ICD-10-CM | POA: Insufficient documentation

## 2015-05-07 DIAGNOSIS — Z8673 Personal history of transient ischemic attack (TIA), and cerebral infarction without residual deficits: Secondary | ICD-10-CM | POA: Insufficient documentation

## 2015-05-07 DIAGNOSIS — F329 Major depressive disorder, single episode, unspecified: Secondary | ICD-10-CM | POA: Insufficient documentation

## 2015-05-07 LAB — CBC WITH DIFFERENTIAL/PLATELET
BASOS ABS: 0 10*3/uL (ref 0.0–0.1)
Basophils Relative: 1 %
Eosinophils Absolute: 0.1 10*3/uL (ref 0.0–0.7)
Eosinophils Relative: 2 %
HEMATOCRIT: 39.7 % (ref 36.0–46.0)
Hemoglobin: 12.9 g/dL (ref 12.0–15.0)
Lymphocytes Relative: 34 %
Lymphs Abs: 1.6 10*3/uL (ref 0.7–4.0)
MCH: 29.3 pg (ref 26.0–34.0)
MCHC: 32.5 g/dL (ref 30.0–36.0)
MCV: 90 fL (ref 78.0–100.0)
Monocytes Absolute: 0.3 10*3/uL (ref 0.1–1.0)
Monocytes Relative: 7 %
NEUTROS ABS: 2.7 10*3/uL (ref 1.7–7.7)
NEUTROS PCT: 56 %
PLATELETS: 169 10*3/uL (ref 150–400)
RBC: 4.41 MIL/uL (ref 3.87–5.11)
RDW: 12.9 % (ref 11.5–15.5)
WBC: 4.8 10*3/uL (ref 4.0–10.5)

## 2015-05-07 LAB — COMPREHENSIVE METABOLIC PANEL
ALBUMIN: 3.7 g/dL (ref 3.5–5.0)
ALT: 15 U/L (ref 14–54)
ANION GAP: 13 (ref 5–15)
AST: 22 U/L (ref 15–41)
Alkaline Phosphatase: 89 U/L (ref 38–126)
BILIRUBIN TOTAL: 0.5 mg/dL (ref 0.3–1.2)
BUN: 10 mg/dL (ref 6–20)
CHLORIDE: 101 mmol/L (ref 101–111)
CO2: 26 mmol/L (ref 22–32)
Calcium: 9.4 mg/dL (ref 8.9–10.3)
Creatinine, Ser: 1 mg/dL (ref 0.44–1.00)
GFR calc Af Amer: >60 mL/min (ref >60)
GFR calc non Af Amer: 54 mL/min — ABNORMAL LOW (ref >60)
GLUCOSE: 102 mg/dL — AB (ref 65–99)
POTASSIUM: 3.6 mmol/L (ref 3.5–5.1)
Sodium: 140 mmol/L (ref 135–145)
TOTAL PROTEIN: 6.7 g/dL (ref 6.5–8.1)

## 2015-05-07 NOTE — ED Notes (Addendum)
Pt. reports mild numbness/tingling at left hand and left foot onset this evening , alert and oriented , speech clear with no facial asymmetry , equal strong grips with no arm drift , ambulatory , pt. stated numbness is improving .

## 2015-05-08 ENCOUNTER — Observation Stay (HOSPITAL_COMMUNITY): Payer: Medicare Other

## 2015-05-08 ENCOUNTER — Ambulatory Visit: Payer: Medicare Other | Admitting: Physical Therapy

## 2015-05-08 ENCOUNTER — Observation Stay (HOSPITAL_COMMUNITY)
Admission: EM | Admit: 2015-05-08 | Discharge: 2015-05-09 | Disposition: A | Discharge status: Home / Self Care | Payer: Medicare Other | Attending: Family Medicine | Admitting: Family Medicine

## 2015-05-08 ENCOUNTER — Emergency Department (HOSPITAL_COMMUNITY): Payer: Medicare Other

## 2015-05-08 DIAGNOSIS — K222 Esophageal obstruction: Secondary | ICD-10-CM

## 2015-05-08 DIAGNOSIS — N3281 Overactive bladder: Secondary | ICD-10-CM | POA: Diagnosis not present

## 2015-05-08 DIAGNOSIS — F329 Major depressive disorder, single episode, unspecified: Secondary | ICD-10-CM | POA: Diagnosis present

## 2015-05-08 DIAGNOSIS — G459 Transient cerebral ischemic attack, unspecified: Secondary | ICD-10-CM | POA: Diagnosis not present

## 2015-05-08 DIAGNOSIS — G451 Carotid artery syndrome (hemispheric): Secondary | ICD-10-CM | POA: Diagnosis not present

## 2015-05-08 DIAGNOSIS — R4701 Aphasia: Secondary | ICD-10-CM | POA: Diagnosis not present

## 2015-05-08 DIAGNOSIS — F418 Other specified anxiety disorders: Secondary | ICD-10-CM | POA: Diagnosis not present

## 2015-05-08 DIAGNOSIS — K219 Gastro-esophageal reflux disease without esophagitis: Secondary | ICD-10-CM | POA: Diagnosis present

## 2015-05-08 DIAGNOSIS — E785 Hyperlipidemia, unspecified: Secondary | ICD-10-CM | POA: Insufficient documentation

## 2015-05-08 DIAGNOSIS — E7800 Pure hypercholesterolemia, unspecified: Secondary | ICD-10-CM

## 2015-05-08 DIAGNOSIS — M4692 Unspecified inflammatory spondylopathy, cervical region: Secondary | ICD-10-CM | POA: Insufficient documentation

## 2015-05-08 DIAGNOSIS — M47812 Spondylosis without myelopathy or radiculopathy, cervical region: Secondary | ICD-10-CM | POA: Insufficient documentation

## 2015-05-08 DIAGNOSIS — F32A Depression, unspecified: Secondary | ICD-10-CM | POA: Diagnosis present

## 2015-05-08 DIAGNOSIS — F419 Anxiety disorder, unspecified: Secondary | ICD-10-CM | POA: Diagnosis present

## 2015-05-08 LAB — ETHANOL: Alcohol, Ethyl (B): <5 mg/dL (ref <5)

## 2015-05-08 LAB — DIFFERENTIAL
Basophils Absolute: 0 10*3/uL (ref 0.0–0.1)
Basophils Relative: 1 %
EOS PCT: 2 %
Eosinophils Absolute: 0.1 10*3/uL (ref 0.0–0.7)
LYMPHS ABS: 1.9 10*3/uL (ref 0.7–4.0)
LYMPHS PCT: 43 %
MONO ABS: 0.4 10*3/uL (ref 0.1–1.0)
Monocytes Relative: 10 %
Neutro Abs: 2 10*3/uL (ref 1.7–7.7)
Neutrophils Relative %: 44 %

## 2015-05-08 LAB — APTT: aPTT: 27 seconds (ref 24–37)

## 2015-05-08 LAB — CBC
HEMATOCRIT: 40.3 % (ref 36.0–46.0)
HEMOGLOBIN: 12.9 g/dL (ref 12.0–15.0)
MCH: 28.9 pg (ref 26.0–34.0)
MCHC: 32 g/dL (ref 30.0–36.0)
MCV: 90.4 fL (ref 78.0–100.0)
Platelets: 161 10*3/uL (ref 150–400)
RBC: 4.46 MIL/uL (ref 3.87–5.11)
RDW: 13 % (ref 11.5–15.5)
WBC: 4.4 10*3/uL (ref 4.0–10.5)

## 2015-05-08 LAB — URINALYSIS, ROUTINE W REFLEX MICROSCOPIC
BILIRUBIN URINE: NEGATIVE
Glucose, UA: NEGATIVE mg/dL
KETONES UR: NEGATIVE mg/dL
Nitrite: NEGATIVE
Protein, ur: NEGATIVE mg/dL
SPECIFIC GRAVITY, URINE: 1.009 (ref 1.005–1.030)
pH: 6.5 (ref 5.0–8.0)

## 2015-05-08 LAB — RAPID URINE DRUG SCREEN, HOSP PERFORMED
AMPHETAMINES: NOT DETECTED
BARBITURATES: NOT DETECTED
BENZODIAZEPINES: NOT DETECTED
COCAINE: NOT DETECTED
Opiates: NOT DETECTED
Tetrahydrocannabinol: NOT DETECTED

## 2015-05-08 LAB — URINE MICROSCOPIC-ADD ON

## 2015-05-08 LAB — I-STAT TROPONIN, ED: Troponin i, poc: 0 ng/mL (ref 0.00–0.08)

## 2015-05-08 LAB — I-STAT CHEM 8, ED
BUN: 14 mg/dL (ref 6–20)
CALCIUM ION: 1.11 mmol/L — AB (ref 1.13–1.30)
CREATININE: 0.9 mg/dL (ref 0.44–1.00)
Chloride: 105 mmol/L (ref 101–111)
GLUCOSE: 101 mg/dL — AB (ref 65–99)
HCT: 42 % (ref 36.0–46.0)
Hemoglobin: 14.3 g/dL (ref 12.0–15.0)
Potassium: 3.6 mmol/L (ref 3.5–5.1)
SODIUM: 142 mmol/L (ref 135–145)
TCO2: 26 mmol/L (ref 0–100)

## 2015-05-08 LAB — PROTIME-INR
INR: 1.1 (ref 0.00–1.49)
Prothrombin Time: 14.4 seconds (ref 11.6–15.2)

## 2015-05-08 MED ORDER — PANTOPRAZOLE SODIUM 40 MG PO TBEC
40.0000 mg | DELAYED_RELEASE_TABLET | Freq: Every day | ORAL | Status: DC
  Administered 2015-05-08 – 2015-05-09 (×2): 40 mg via ORAL
  Filled 2015-05-08 (×2): qty 1

## 2015-05-08 MED ORDER — PREDNISOLONE ACETATE 1 % OP SUSP
1.0000 [drp] | Freq: Every day | OPHTHALMIC | Status: DC
Start: 2015-05-08 — End: 2015-05-09
  Administered 2015-05-08 – 2015-05-09 (×2): 1 [drp] via OPHTHALMIC
  Filled 2015-05-08: qty 1

## 2015-05-08 MED ORDER — GADOBENATE DIMEGLUMINE 529 MG/ML IV SOLN
10.0000 mL | Freq: Once | INTRAVENOUS | Status: AC
  Administered 2015-05-08: 10 mL via INTRAVENOUS

## 2015-05-08 MED ORDER — ASPIRIN EC 325 MG PO TBEC
325.0000 mg | DELAYED_RELEASE_TABLET | Freq: Every day | ORAL | Status: DC
  Administered 2015-05-08 – 2015-05-09 (×2): 325 mg via ORAL
  Filled 2015-05-08 (×2): qty 1

## 2015-05-08 MED ORDER — STROKE: EARLY STAGES OF RECOVERY BOOK
Freq: Once | Status: AC
  Administered 2015-05-08: 12:00:00
  Filled 2015-05-08: qty 1

## 2015-05-08 MED ORDER — ALPRAZOLAM 0.5 MG PO TABS: 0.5000 mg | ORAL_TABLET | Freq: Three times a day (TID) | ORAL | Status: DC | PRN

## 2015-05-08 MED ORDER — ROSUVASTATIN CALCIUM 5 MG PO TABS: 5.0000 mg | ORAL_TABLET | ORAL | Status: DC

## 2015-05-08 MED ORDER — ENOXAPARIN SODIUM 40 MG/0.4ML ~~LOC~~ SOLN
40.0000 mg | SUBCUTANEOUS | Status: DC
  Administered 2015-05-08 – 2015-05-09 (×2): 40 mg via SUBCUTANEOUS
  Filled 2015-05-08 (×2): qty 0.4

## 2015-05-08 MED ORDER — AZELASTINE HCL 0.1 % NA SOLN
2.0000 | Freq: Every evening | NASAL | Status: DC | PRN
  Filled 2015-05-08: qty 30

## 2015-05-08 MED ORDER — FESOTERODINE FUMARATE ER 4 MG PO TB24
4.0000 mg | ORAL_TABLET | Freq: Every day | ORAL | Status: DC
  Administered 2015-05-08: 4 mg via ORAL
  Filled 2015-05-08 (×3): qty 1

## 2015-05-08 MED ORDER — DULOXETINE HCL 20 MG PO CPEP
20.0000 mg | ORAL_CAPSULE | Freq: Every day | ORAL | Status: DC
  Administered 2015-05-08 – 2015-05-09 (×2): 20 mg via ORAL
  Filled 2015-05-08 (×3): qty 1

## 2015-05-08 NOTE — Progress Notes (Signed)
[facility] Signout received from Dr. Claudine Mouton 75 year old female with a past medical history significant for HLD, TIA, memory loss, and GERD; who presented with numbness and slurred speech since 4 PM yesterday afternoon. Symptoms have gradually improved, but numbness persist. CT of brain and initial blood work showed no acute abnormalities. Patient seen by neurology and recommendations given. Admitted to telemetry bed for observation.

## 2015-05-08 NOTE — ED Notes (Signed)
Patient transported to CT 

## 2015-05-08 NOTE — ED Notes (Signed)
Patient Husband was given a blanket, and Pt. Was given apple juice, graham crackers and peanut butter, pt. wanted Muffin  Heated up.

## 2015-05-08 NOTE — ED Notes (Signed)
Returned from ct scan 

## 2015-05-08 NOTE — Progress Notes (Signed)
STROKE TEAM PROGRESS NOTE   HISTORY OF PRESENT ILLNESS Paige Pena is an 75 y.o. female with a hx of TIA, GERD, HLP who presents with numbness of the L hand up to the elbow and L leg/foot which started yesterday evening at 4pm (LKW 05/07/2015, at 4 PM) and has been getting gradually better. States she is almost back to normal. She denies any weakness, SOB, chest pain and any headache. Modified Rankin: Rankin Score=0. Patient was not administered IV t-PA secondary to symptoms resolved. She was admitted for further evaluation and treatment.   SUBJECTIVE (INTERVAL HISTORY) Her husband is at the bedside and her daughter is on the phone.  Overall she feels her condition is stable. Spent very long time with pt and her family recounting the symptoms since January admission. She stated that on 03/22/15 she was washing her face and suddenly her left hand dropped with weakness and numbness, lasting about 1-2 min. She was admitted for TIA and all work up essentially negative. During admission she was found to walk towards to left and was discharged with outpt OT. She has been doing PT as outpt so far. She stated since discharge, she has intermittent left arm and hand numbness and left leg heaviness. Can not tell how frequent, but comes and goes. Yesterday, she felt left hand numbness and left leg heaviness getting worse. She came to ED and was admitted. Today, her left hand numbness much better but still has left leg heaviness feeling. Pt denies any stress, depression. She does endorse sometimes b/b incontinence if she does not get to bathroom quick. Denies neck pain or seizure like activity. Daughter complains of sometimes fine tremor in hands.    OBJECTIVE Temp:  [97.9 F (36.6 C)-98.7 F (37.1 C)] 98 F (36.7 C) (03/14 1054) Pulse Rate:  [66-91] 88 (03/14 1215) Cardiac Rhythm:  [-]  Resp:  [14-22] 15 (03/14 1215) BP: (115-142)/(58-81) 123/74 mmHg (03/14 1215) SpO2:  [99 %-100 %] 100 % (03/14  1215) Weight:  [59.875 kg (132 lb)] 59.875 kg (132 lb) (03/13 2137)  CBC:  Recent Labs Lab 05/07/15 2203 05/08/15 0559 05/08/15 0614  WBC 4.8  --  4.4  NEUTROABS 2.7  --  2.0  HGB 12.9 14.3 12.9  HCT 39.7 42.0 40.3  MCV 90.0  --  90.4  PLT 169  --  [id number]    Basic Metabolic Panel:  Recent Labs Lab 05/07/15 2203 05/08/15 0559  NA 140 142  K 3.6 3.6  CL 101 105  CO2 26  --   GLUCOSE 102* 101*  BUN 10 14  CREATININE 1.00 0.90  CALCIUM 9.4  --     Lipid Panel:    Component Value Date/Time   CHOL 205* 03/21/2015 0145   TRIG 75 03/21/2015 0145   HDL 73 03/21/2015 0145   CHOLHDL 2.8 03/21/2015 0145   VLDL 15 03/21/2015 0145   LDLCALC 117* 03/21/2015 0145   HgbA1c:  Lab Results  Component Value Date   HGBA1C 5.6 03/21/2015   Urine Drug Screen:    Component Value Date/Time   LABOPIA NONE DETECTED 05/08/2015 1240   COCAINSCRNUR NONE DETECTED 05/08/2015 1240   LABBENZ NONE DETECTED 05/08/2015 1240   AMPHETMU NONE DETECTED 05/08/2015 1240   THCU NONE DETECTED 05/08/2015 1240   LABBARB NONE DETECTED 05/08/2015 1240      IMAGING I have personally reviewed the radiological images below and agree with the radiology interpretations.  Ct Head Wo Contrast  05/08/2015   IMPRESSION: 1.  No evidence of acute intracranial abnormality. 2. Chronic small vessel ischemic changes as above. Electronically Signed   By: Logan Bores M.D.   On: 05/08/2015 07:08   Mr Jodene Nam Head Wo Contrast  MRI HEAD  05/08/2015  Exam is motion degraded. No acute infarct or intracranial hemorrhage. Remote small cerebellar infarcts. Mild chronic small vessel disease changes. Mild global atrophy without hydrocephalus. No intracranial mass or abnormal enhancement. Cervical spondylotic changes with spinal stenosis and cord flattening (C3-4 thru C5-6) incompletely assessed on present exam. Cervical spine MR can be obtained further delineation if clinically desired. Mild cerebellar tonsillar ectopia. Slight  impression upon the ventral cervical medullary junction by the transverse ligament. Findings without change since 2012 MR.   MRA HEAD  05/08/2015  Ectatic left internal carotid artery extending to level of left carotid terminus. Mild ectasia right internal carotid artery. Aplastic A1 segment right anterior cerebral artery. Mild narrowing and irregularity middle cerebral artery branch vessels and A2 segment anterior cerebral artery (greater on the right). Left vertebral artery is dominant. Right vertebral artery is small and narrowed after takeoff of the right posterior inferior cerebellar artery. Mild irregularity and narrowing posterior inferior cerebellar artery bilaterally. Ectatic basilar artery with mild narrowing and irregularity. Nonvisualized left anterior inferior cerebellar artery. Fetal origin of the right posterior cerebral artery. Mild ectasia proximal left posterior cerebral artery without focal aneurysm. Posterior cerebral artery distal branch vessel mild narrowing and irregularity bilaterally.   MRI NECK  05/08/2015  No evidence of hemodynamically significant stenosis involving either carotid bifurcation. Left vertebral artery is dominant.   2-D echocardiogram - Left ventricle: The cavity size was normal. Wall thickness was increased in a pattern of mild LVH. Systolic function was normal. The estimated ejection fraction was in the range of 60% to 65%. Wall motion was normal; there were no regional wall motion abnormalities. Doppler parameters are consistent with abnormal left ventricular relaxation (grade 1 diastolic dysfunction). - Mitral valve: There was mild regurgitation. - Left atrium: The atrium was mildly dilated.   PHYSICAL EXAM Physical exam  Temp:  [97.9 F (36.6 C)-99.9 F (37.7 C)] 99.9 F (37.7 C) (03/14 2149) Pulse Rate:  [62-106] 106 (03/14 2149) Resp:  [14-22] 16 (03/14 2149) BP: (97-143)/(58-86) 120/65 mmHg (03/14 2149) SpO2:  [93 %-100 %] 93 % (03/14  2149)  General - Well nourished, well developed, in no apparent distress.  Ophthalmologic - Fundi not visualized due to eye movement.  Cardiovascular - Regular rate and rhythm.  Mental Status -  Level of arousal and orientation to time, place, and person were intact. Language including expression, naming, repetition, comprehension was assessed and found intact. Fund of Knowledge was assessed and was impaired.  Cranial Nerves II - XII - II - Visual field intact OU. III, IV, VI - Extraocular movements intact. V - Facial sensation intact bilaterally. VII - Facial movement intact bilaterally. VIII - Hearing & vestibular intact bilaterally X - Palate elevates symmetrically. XI - Chin turning & shoulder shrug intact bilaterally. XII - Tongue protrusion intact.  Motor Strength - The patient's strength was normal in all extremities and pronator drift was absent.  Bulk was normal and fasciculations were absent.   Motor Tone - Muscle tone was assessed at the neck and appendages and was normal.  Reflexes - The patient's reflexes were 3+ in all extremities with unsustained b/l ankle clonus and she had no pathological reflexes.  Sensory - Light touch, temperature/pinprick were assessed and were symmetrical except subjective left LE decreased light  touch about 75% to the right.    Coordination - The patient had normal movements in the hands and feet with no ataxia or dysmetria.  Tremor was absent.  Gait and Station - deferred to PT due to safety concerns   ASSESSMENT/PLAN Paige Pena is a 75 y.o. female with history of TIA, hyperlipidemia and GERD presenting with left foot and hand numbness. She did not receive IV t-PA due to resolved symptoms.   Chronic intermittent left arm numbness and left leg heaviness - etiology unclear - check C-spine MRI due to increased DTR although no pathological reflexes so far.  MRI  no acute stroke  MRA head  Mild intracranial atherosclerosis  MRA  neck  Unremarkable  MRI C-spine pending  EEG pending to rule out seizure  2D Echo EF 60-65%  LDL 117 in January  HgbA1c 5.6 in January  Lovenox 40 mg sq daily for VTE prophylaxis  Diet Heart Room service appropriate?: Yes; Fluid consistency:: Thin  aspirin 325 mg daily prior to admission, now on aspirin 325 mg daily  Patient counseled to be compliant with her antithrombotic medications  Ongoing aggressive stroke risk factor management  Therapy recommendations:  pending  Disposition:  pending  Hypertension  Stable  BP goal normotensive  Hyperlipidemia  Home meds:  Omega-3 and restor 5, resumed in hospital  LDL 117 in January, goal < 70  Continue statin at discharge  Other Stroke Risk Factors  Advanced age  Hx stroke/TIA  02/2015 . Right brain TIA with tingling in her left hand  Other Active Problems  Esophageal stricture/GERD  Anxiety and depression  Overactive bladder   Hospital day # 1  I had very long discussion with pt, her husband, daughter at bedside or via the phone. Answered all their questions to their satisfaction.   Rosalin Hawking, MD PhD Stroke Neurology 05/08/2015 10:00 PM      To contact Stroke Continuity provider, please refer to [email or web address] After hours, contact General Neurology

## 2015-05-08 NOTE — ED Provider Notes (Signed)
CSN: [id number]     Arrival date & time 05/07/15  2124 History   First MD Initiated Contact with Patient 05/08/15 [phone]     Chief Complaint  Patient presents with  . Numbness     (Consider location/radiation/quality/duration/timing/severity/associated sxs/prior Treatment) HPI  Paige Pena is a 75 y.o. female with PMH of TIA, HLD, here for L sided numbness.  Patient states this has been going on since she arrived around 8pm.  Her symptoms are now slowly improving.  She also describes slurred speech when speaking to her daughter today.  She has no further neuro symptoms.  She states these were her TIA symptoms previously as well. She denies being sick recently. There are no further complaints.  10 Systems reviewed and are negative for acute change except as noted in the HPI.    Past Medical History  Diagnosis Date  . Dysphonia   . Asthmatic bronchitis   . Chest pain, atypical   . Cerebrovascular disease   . Hypercholesteremia   . GERD (gastroesophageal reflux disease)   . Acute cystitis   . Esophageal stricture   . Gastroparesis   . Diverticulosis of colon   . IBS (irritable bowel syndrome)   . Hemorrhoids   . Urinary incontinence   . Hematuria, microscopic   . DJD (degenerative joint disease)   . Fibromyalgia   . Anxiety   . Vitamin D deficiency   . RLS (restless legs syndrome)   . Aphasia     transsient aphasia Jan 2012  . Memory loss   . Memory deficits 02/15/2013  . TIA (transient ischemic attack)    Past Surgical History  Procedure Laterality Date  . Abdominal hysterectomy  1995  . Corneal transplant for keratonconus    . Right bunion surgery    . Left foot fracture with orif  07/2004    Dr. Sharol Given  . Eye surgery  2013   Family History  Problem Relation Age of Onset  . Breast cancer      aunt  . Diabetes      aunt,brother,uncle  . Kidney disease      father  . Hyperlipidemia Sister   . Diabetes Brother   . Hypertension Mother   . Anemia Mother   .  Kidney disease Father   . Dementia Maternal Grandmother    Social History  Substance Use Topics  . Smoking status: Never Smoker   . Smokeless tobacco: Never Used  . Alcohol Use: No   OB History    No data available     Review of Systems    Allergies  Morphine; Metoclopramide hcl; Penicillins; Prednisone; and Sulfonamide derivatives  Home Medications   Prior to Admission medications   Medication Sig Start Date End Date Taking? Authorizing Provider  ALPRAZolam (XANAX) 0.5 MG tablet TAKE  1/2 -1 TABLET BY MOUTH THREE TIMES DAILY AS NEEDED Patient taking differently: Take 0.5 mg by mouth 3 (three) times daily as needed for anxiety. TAKE  1/2 -1 TABLET BY MOUTH THREE TIMES DAILY AS NEEDED 05/23/13  Yes [name], MD  aspirin (ECOTRIN) 325 MG EC tablet Take 325 mg by mouth daily.     Yes Historical Provider, MD  azelastine (ASTELIN) 0.1 % nasal spray Place 2 sprays into both nostrils at bedtime as needed for rhinitis. Use in each nostril as directed 10/13/14  Yes Colon Branch, MD  DULoxetine (CYMBALTA) 20 MG capsule TAKE ONE CAPSULE BY MOUTH DAILY 05/03/15  Yes [name],  MD  fluticasone (FLONASE) 50 MCG/ACT nasal spray Place 2 sprays into both nostrils daily. Patient taking differently: Place 2 sprays into both nostrils daily as needed for allergies.  10/05/14  Yes [name], MD  Iron-Vitamins (GERITOL) LIQD Take 15 mLs by mouth daily.   Yes Historical Provider, MD  KRILL OIL OMEGA-3 PO Take 1 capsule by mouth daily.   Yes Historical Provider, MD  loratadine (CLARITIN) 10 MG tablet Take 1 tablet (10 mg total) by mouth daily. Patient taking differently: Take 10 mg by mouth daily as needed for allergies.  10/05/14  Yes [name], MD  omeprazole (PRILOSEC) 40 MG capsule TAKE ONE CAPSULE BY MOUTH DAILY 10/05/14  Yes [name], MD  prednisoLONE acetate (PRED FORTE) 1 % ophthalmic suspension Place 1 drop into both eyes daily.  12/22/12  Yes Historical Provider,  MD  rosuvastatin (CRESTOR) 5 MG tablet Take 1 tablet by mouth three days each week. Patient taking differently: Take 5 mg by mouth 2 (two) times a week. Tuesday and Saturday 06/20/14  Yes Josue Hector, MD  tolterodine (DETROL LA) 4 MG 24 hr capsule Take 1 capsule (4 mg total) by mouth at bedtime. 10/05/14  Yes [name], MD  Acetaminophen (TYLENOL) 325 MG CAPS Take 325 mg by mouth 2 (two) times daily. Patient not taking: Reported on 05/08/2015 03/26/15   Larey Seat, MD  dicyclomine (BENTYL) 20 MG tablet Take 1 tablet (20 mg total) by mouth 4 (four) times daily -  before meals and at bedtime. Patient not taking: Reported on 05/08/2015 02/27/15   [name], MD  ondansetron (ZOFRAN) 4 MG tablet Take 1 tablet (4 mg total) by mouth every 8 (eight) hours as needed for nausea or vomiting. Patient not taking: Reported on 05/08/2015 02/15/15   [name], MD  Phosphatidylserine-DHA-EPA (VAYACOG) 100-19.5-6.5 MG CAPS Take 1 tablet by mouth daily. Patient not taking: Reported on 05/08/2015 04/10/15   Kathrynn Ducking, MD  ranitidine (ZANTAC) 300 MG tablet Take 1 tablet (300 mg total) by mouth at bedtime. Patient not taking: Reported on 05/08/2015 02/15/15   [name], MD  sucralfate (CARAFATE) 1 G tablet Take 1 tablet (1 g total) by mouth 4 (four) times daily -  with meals and at bedtime. Patient not taking: Reported on 05/08/2015 02/15/15   [name], MD   BP 121/58 mmHg  Pulse 70  Temp(Src) 98.7 F (37.1 C) (Oral)  Resp 20  Ht 5\' 1"  (1.549 m)  Wt 132 lb (59.875 kg)  BMI 24.95 kg/m2  SpO2 100% Physical Exam  Constitutional: She is oriented to person, place, and time. She appears well-developed and well-nourished. No distress.  HENT:  Head: Normocephalic and atraumatic.  Nose: Nose normal.  Mouth/Throat: Oropharynx is clear and moist. No oropharyngeal exudate.  Eyes: Conjunctivae and EOM are normal. Pupils are equal, round, and reactive to light. No scleral  icterus.  Neck: Normal range of motion. Neck supple. No JVD present. No tracheal deviation present. No thyromegaly present.  Cardiovascular: Normal rate, regular rhythm and normal heart sounds.  Exam reveals no gallop and no friction rub.   No murmur heard. Pulmonary/Chest: Effort normal and breath sounds normal. No respiratory distress. She has no wheezes. She exhibits no tenderness.  Abdominal: Soft. Bowel sounds are normal. She exhibits no distension and no mass. There is no tenderness. There is no rebound and no guarding.  Musculoskeletal: Normal range of motion. She exhibits no edema or tenderness.  Lymphadenopathy:    She has no cervical adenopathy.  Neurological: She is alert and oriented to person, place, and time. No cranial nerve deficit. She exhibits normal muscle tone.  Normal strength and sensation in all extremities, normal cerebellar testing  Skin: Skin is warm and dry. No rash noted. No erythema. No pallor.  Nursing note and vitals reviewed.   ED Course  Procedures (including critical care time) Labs Review Labs Reviewed  COMPREHENSIVE METABOLIC PANEL - Abnormal; Notable for the following:    Glucose, Bld 102 (*)    GFR calc non Af Amer 54 (*)    All other components within normal limits  I-STAT CHEM 8, ED - Abnormal; Notable for the following:    Glucose, Bld 101 (*)    Calcium, Ion 1.11 (*)    All other components within normal limits  CBC WITH DIFFERENTIAL/PLATELET  PROTIME-INR  APTT  CBC  DIFFERENTIAL  ETHANOL  URINE RAPID DRUG SCREEN, HOSP PERFORMED  URINALYSIS, ROUTINE W REFLEX MICROSCOPIC (NOT AT [facility])  I-STAT TROPOININ, ED    Imaging Review Ct Head Wo Contrast  05/08/2015  CLINICAL DATA:  Episode of left-sided numbness and confusion with aphasia yesterday. EXAM: CT HEAD WITHOUT CONTRAST TECHNIQUE: Contiguous axial images were obtained from the base of the skull through the vertex without intravenous contrast. COMPARISON:  Head MRI 03/20/2015 and CT  03/16/2015 FINDINGS: There is no evidence of acute cortical infarct, intracranial hemorrhage, mass, midline shift, or extra-axial fluid collection. Ventricles and sulci are within normal limits for age. Crowding at the foramen magnum is consistent with previously described cerebellar tonsillar ectopia, more notable on the left and more fully evaluated on prior MRI. Periventricular white-matter hypodensities are nonspecific but compatible with mild chronic small vessel ischemic disease. Chronic, small bilateral cerebellar infarcts are again noted. Orbits are unremarkable. The visualized paranasal sinuses and mastoid air cells are clear. No skull fracture. IMPRESSION: 1. No evidence of acute intracranial abnormality. 2. Chronic small vessel ischemic changes as above. Electronically Signed   By: Logan Bores M.D.   On: 05/08/2015 07:08   Mr Jodene Nam Head Wo Contrast  05/08/2015  CLINICAL DATA:  75 year old female with left hand/ leg numbness, confusion and expressive aphasia. Subsequent encounter. EXAM: MRI HEAD WITHOUT AND WITH CONTRAST AND MRA HEAD WITHOUT CONTRAST AND MRI NECK WITHOUT AND WITH CONTRAST TECHNIQUE: Multiplanar, multiecho pulse sequences of the brain and surrounding structures were obtained without and with intravenous contrast. Angiographic images of the head were obtained using MRA technique without contrast. Multiplanar, multiecho pulse sequences of the neck and surrounding structures were obtained without and with intravenous contrast. CONTRAST:  27mL MULTIHANCE GADOBENATE DIMEGLUMINE 529 MG/ML IV SOLN COMPARISON:  05/08/2015 CT. 03/22/2015 MRA. 03/20/2015 and 03/08/2010 brain MR. FINDINGS: MRI HEAD FINDINGS Exam is motion degraded. No acute infarct or intracranial hemorrhage. Remote small cerebellar infarcts. Mild chronic small vessel disease changes. Mild global atrophy without hydrocephalus. No intracranial mass or abnormal enhancement. Cervical spondylotic changes with spinal stenosis and cord  flattening (C3-4 thru C5-6) incompletely assessed on present exam. Cervical spine MR can be obtained further delineation if clinically desired. Mild cerebellar tonsillar ectopia. Slight impression upon the ventral cervical medullary junction by the transverse ligament. Findings without change since 2012 MR. Orbital structures unremarkable. MRA HEAD FINDINGS Ectatic left internal carotid artery extending to level of left carotid terminus. Mild ectasia right internal carotid artery. Fetal contribution to the posterior cerebral arteries bilaterally. Aplastic A1 segment right anterior cerebral artery. Mild narrowing and irregularity middle cerebral artery  branch vessels and A2 segment anterior cerebral artery (greater on the right). Left vertebral artery is dominant. Right vertebral artery is small and narrowed after takeoff of the right posterior inferior cerebellar artery. Mild irregularity and narrowing posterior inferior cerebellar artery bilaterally. Ectatic basilar artery with mild narrowing and irregularity. Nonvisualized left anterior inferior cerebellar artery. Fetal origin of the right posterior cerebral artery. Mild ectasia proximal left posterior cerebral artery without focal aneurysm. Posterior cerebral artery distal branch vessel mild narrowing and irregularity bilaterally. MRI NECK FINDINGS Innominate artery and left common carotid artery with either common origin or close origin. Mild irregularity aortic arch. Ectatic common carotid artery bilaterally. No hemodynamically significant stenosis of either carotid bifurcation. Mild narrowing subclavian artery bilaterally. Left vertebral artery is dominant. Mild irregularity of the vertebral arteries without high-grade stenosis. Minimal bulge right vertebral artery C2 segment may be artifact. IMPRESSION: MRI HEAD Exam is motion degraded. No acute infarct or intracranial hemorrhage. Remote small cerebellar infarcts. Mild chronic small vessel disease changes.  Mild global atrophy without hydrocephalus. No intracranial mass or abnormal enhancement. Cervical spondylotic changes with spinal stenosis and cord flattening (C3-4 thru C5-6) incompletely assessed on present exam. Cervical spine MR can be obtained further delineation if clinically desired. Mild cerebellar tonsillar ectopia. Slight impression upon the ventral cervical medullary junction by the transverse ligament. Findings without change since 2012 MR. MRA HEAD Ectatic left internal carotid artery extending to level of left carotid terminus. Mild ectasia right internal carotid artery. Aplastic A1 segment right anterior cerebral artery. Mild narrowing and irregularity middle cerebral artery branch vessels and A2 segment anterior cerebral artery (greater on the right). Left vertebral artery is dominant. Right vertebral artery is small and narrowed after takeoff of the right posterior inferior cerebellar artery. Mild irregularity and narrowing posterior inferior cerebellar artery bilaterally. Ectatic basilar artery with mild narrowing and irregularity. Nonvisualized left anterior inferior cerebellar artery. Fetal origin of the right posterior cerebral artery. Mild ectasia proximal left posterior cerebral artery without focal aneurysm. Posterior cerebral artery distal branch vessel mild narrowing and irregularity bilaterally. MRI NECK No evidence of hemodynamically significant stenosis involving either carotid bifurcation. Left vertebral artery is dominant. Electronically Signed   By: Genia Del M.D.   On: 05/08/2015 11:46   Mr Angiogram Neck W Wo Contrast  05/08/2015  CLINICAL DATA:  75 year old female with left hand/ leg numbness, confusion and expressive aphasia. Subsequent encounter. EXAM: MRI HEAD WITHOUT AND WITH CONTRAST AND MRA HEAD WITHOUT CONTRAST AND MRI NECK WITHOUT AND WITH CONTRAST TECHNIQUE: Multiplanar, multiecho pulse sequences of the brain and surrounding structures were obtained without and with  intravenous contrast. Angiographic images of the head were obtained using MRA technique without contrast. Multiplanar, multiecho pulse sequences of the neck and surrounding structures were obtained without and with intravenous contrast. CONTRAST:  80mL MULTIHANCE GADOBENATE DIMEGLUMINE 529 MG/ML IV SOLN COMPARISON:  05/08/2015 CT. 03/22/2015 MRA. 03/20/2015 and 03/08/2010 brain MR. FINDINGS: MRI HEAD FINDINGS Exam is motion degraded. No acute infarct or intracranial hemorrhage. Remote small cerebellar infarcts. Mild chronic small vessel disease changes. Mild global atrophy without hydrocephalus. No intracranial mass or abnormal enhancement. Cervical spondylotic changes with spinal stenosis and cord flattening (C3-4 thru C5-6) incompletely assessed on present exam. Cervical spine MR can be obtained further delineation if clinically desired. Mild cerebellar tonsillar ectopia. Slight impression upon the ventral cervical medullary junction by the transverse ligament. Findings without change since 2012 MR. Orbital structures unremarkable. MRA HEAD FINDINGS Ectatic left internal carotid artery extending to level of left carotid  terminus. Mild ectasia right internal carotid artery. Fetal contribution to the posterior cerebral arteries bilaterally. Aplastic A1 segment right anterior cerebral artery. Mild narrowing and irregularity middle cerebral artery branch vessels and A2 segment anterior cerebral artery (greater on the right). Left vertebral artery is dominant. Right vertebral artery is small and narrowed after takeoff of the right posterior inferior cerebellar artery. Mild irregularity and narrowing posterior inferior cerebellar artery bilaterally. Ectatic basilar artery with mild narrowing and irregularity. Nonvisualized left anterior inferior cerebellar artery. Fetal origin of the right posterior cerebral artery. Mild ectasia proximal left posterior cerebral artery without focal aneurysm. Posterior cerebral artery  distal branch vessel mild narrowing and irregularity bilaterally. MRI NECK FINDINGS Innominate artery and left common carotid artery with either common origin or close origin. Mild irregularity aortic arch. Ectatic common carotid artery bilaterally. No hemodynamically significant stenosis of either carotid bifurcation. Mild narrowing subclavian artery bilaterally. Left vertebral artery is dominant. Mild irregularity of the vertebral arteries without high-grade stenosis. Minimal bulge right vertebral artery C2 segment may be artifact. IMPRESSION: MRI HEAD Exam is motion degraded. No acute infarct or intracranial hemorrhage. Remote small cerebellar infarcts. Mild chronic small vessel disease changes. Mild global atrophy without hydrocephalus. No intracranial mass or abnormal enhancement. Cervical spondylotic changes with spinal stenosis and cord flattening (C3-4 thru C5-6) incompletely assessed on present exam. Cervical spine MR can be obtained further delineation if clinically desired. Mild cerebellar tonsillar ectopia. Slight impression upon the ventral cervical medullary junction by the transverse ligament. Findings without change since 2012 MR. MRA HEAD Ectatic left internal carotid artery extending to level of left carotid terminus. Mild ectasia right internal carotid artery. Aplastic A1 segment right anterior cerebral artery. Mild narrowing and irregularity middle cerebral artery branch vessels and A2 segment anterior cerebral artery (greater on the right). Left vertebral artery is dominant. Right vertebral artery is small and narrowed after takeoff of the right posterior inferior cerebellar artery. Mild irregularity and narrowing posterior inferior cerebellar artery bilaterally. Ectatic basilar artery with mild narrowing and irregularity. Nonvisualized left anterior inferior cerebellar artery. Fetal origin of the right posterior cerebral artery. Mild ectasia proximal left posterior cerebral artery without focal  aneurysm. Posterior cerebral artery distal branch vessel mild narrowing and irregularity bilaterally. MRI NECK No evidence of hemodynamically significant stenosis involving either carotid bifurcation. Left vertebral artery is dominant. Electronically Signed   By: Genia Del M.D.   On: 05/08/2015 11:46   Mr Jeri Cos [id number] Contrast  05/08/2015  CLINICAL DATA:  75 year old female with left hand/ leg numbness, confusion and expressive aphasia. Subsequent encounter. EXAM: MRI HEAD WITHOUT AND WITH CONTRAST AND MRA HEAD WITHOUT CONTRAST AND MRI NECK WITHOUT AND WITH CONTRAST TECHNIQUE: Multiplanar, multiecho pulse sequences of the brain and surrounding structures were obtained without and with intravenous contrast. Angiographic images of the head were obtained using MRA technique without contrast. Multiplanar, multiecho pulse sequences of the neck and surrounding structures were obtained without and with intravenous contrast. CONTRAST:  62mL MULTIHANCE GADOBENATE DIMEGLUMINE 529 MG/ML IV SOLN COMPARISON:  05/08/2015 CT. 03/22/2015 MRA. 03/20/2015 and 03/08/2010 brain MR. FINDINGS: MRI HEAD FINDINGS Exam is motion degraded. No acute infarct or intracranial hemorrhage. Remote small cerebellar infarcts. Mild chronic small vessel disease changes. Mild global atrophy without hydrocephalus. No intracranial mass or abnormal enhancement. Cervical spondylotic changes with spinal stenosis and cord flattening (C3-4 thru C5-6) incompletely assessed on present exam. Cervical spine MR can be obtained further delineation if clinically desired. Mild cerebellar tonsillar ectopia. Slight impression upon the ventral cervical medullary  junction by the transverse ligament. Findings without change since 2012 MR. Orbital structures unremarkable. MRA HEAD FINDINGS Ectatic left internal carotid artery extending to level of left carotid terminus. Mild ectasia right internal carotid artery. Fetal contribution to the posterior cerebral arteries  bilaterally. Aplastic A1 segment right anterior cerebral artery. Mild narrowing and irregularity middle cerebral artery branch vessels and A2 segment anterior cerebral artery (greater on the right). Left vertebral artery is dominant. Right vertebral artery is small and narrowed after takeoff of the right posterior inferior cerebellar artery. Mild irregularity and narrowing posterior inferior cerebellar artery bilaterally. Ectatic basilar artery with mild narrowing and irregularity. Nonvisualized left anterior inferior cerebellar artery. Fetal origin of the right posterior cerebral artery. Mild ectasia proximal left posterior cerebral artery without focal aneurysm. Posterior cerebral artery distal branch vessel mild narrowing and irregularity bilaterally. MRI NECK FINDINGS Innominate artery and left common carotid artery with either common origin or close origin. Mild irregularity aortic arch. Ectatic common carotid artery bilaterally. No hemodynamically significant stenosis of either carotid bifurcation. Mild narrowing subclavian artery bilaterally. Left vertebral artery is dominant. Mild irregularity of the vertebral arteries without high-grade stenosis. Minimal bulge right vertebral artery C2 segment may be artifact. IMPRESSION: MRI HEAD Exam is motion degraded. No acute infarct or intracranial hemorrhage. Remote small cerebellar infarcts. Mild chronic small vessel disease changes. Mild global atrophy without hydrocephalus. No intracranial mass or abnormal enhancement. Cervical spondylotic changes with spinal stenosis and cord flattening (C3-4 thru C5-6) incompletely assessed on present exam. Cervical spine MR can be obtained further delineation if clinically desired. Mild cerebellar tonsillar ectopia. Slight impression upon the ventral cervical medullary junction by the transverse ligament. Findings without change since 2012 MR. MRA HEAD Ectatic left internal carotid artery extending to level of left carotid  terminus. Mild ectasia right internal carotid artery. Aplastic A1 segment right anterior cerebral artery. Mild narrowing and irregularity middle cerebral artery branch vessels and A2 segment anterior cerebral artery (greater on the right). Left vertebral artery is dominant. Right vertebral artery is small and narrowed after takeoff of the right posterior inferior cerebellar artery. Mild irregularity and narrowing posterior inferior cerebellar artery bilaterally. Ectatic basilar artery with mild narrowing and irregularity. Nonvisualized left anterior inferior cerebellar artery. Fetal origin of the right posterior cerebral artery. Mild ectasia proximal left posterior cerebral artery without focal aneurysm. Posterior cerebral artery distal branch vessel mild narrowing and irregularity bilaterally. MRI NECK No evidence of hemodynamically significant stenosis involving either carotid bifurcation. Left vertebral artery is dominant. Electronically Signed   By: Genia Del M.D.   On: 05/08/2015 11:46   I have personally reviewed and evaluated these images and lab results as part of my medical decision-making.   EKG Interpretation   Date/Time:  Tuesday May 08 2015 05:56:47 EDT Ventricular Rate:  61 PR Interval:  152 QRS Duration: 81 QT Interval:  421 QTC Calculation: 424 R Axis:   54 Text Interpretation:  Sinus rhythm No significant change since last  tracing Confirmed by Glynn Octave [phone]) on 05/08/2015 6:13:55 AM      MDM   Final diagnoses:  Transient cerebral ischemia, unspecified transient cerebral ischemia type    Patient presents to the ED for stroke like symptoms.  I have concern for TIA and believe the patient needs to be admitted to hositalist for further workup.  I spoke with neurology who will come to the ED to eval the patient.  CT head is neg for bleed.  Neurology recs for the patient to be admitted.  I spoke with Dr. Tamala Julian who accepts the patient to tele  obs.   Everlene Balls, MD 05/08/15 DX:[id number]

## 2015-05-08 NOTE — Consult Note (Signed)
Chief Complaint: L foot and hand numbness/tingling  History obtained from:  Patient     HPI:                                                                                                                                         Paige Pena is an 75 y.o. female with a hx of TIA, GERD, HLP who presents with numbness of the L hand up to the elbow and L leg/foot which started yesterday evening at 4pm and has been getting gradually better. Currently states she is almost back to normal. She denies any weakness, SOB, chest pain and any headache.  Date last known well: 05/07/15 Time last known well: tPA Given: No: returned back to normal No Symptoms         0 No significant disability/able to carry out all usual activities   1 Unable to carry out all previous activities but looks after own affairs             2 Requires help but walks without assistance     3 Unable to walk without assistance/unable to handle own bodily needs 4 Bedridden/incontinent        5 Dead          6  Modified Rankin: Rankin Score=0    Past Medical History  Diagnosis Date  . Dysphonia   . Asthmatic bronchitis   . Chest pain, atypical   . Cerebrovascular disease   . Hypercholesteremia   . GERD (gastroesophageal reflux disease)   . Acute cystitis   . Esophageal stricture   . Gastroparesis   . Diverticulosis of colon   . IBS (irritable bowel syndrome)   . Hemorrhoids   . Urinary incontinence   . Hematuria, microscopic   . DJD (degenerative joint disease)   . Fibromyalgia   . Anxiety   . Vitamin D deficiency   . RLS (restless legs syndrome)   . Aphasia     transsient aphasia Jan 2012  . Memory loss   . Memory deficits 02/15/2013  . TIA (transient ischemic attack)     Past Surgical History  Procedure Laterality Date  . Abdominal hysterectomy  1995  . Corneal transplant for keratonconus    . Right bunion surgery    . Left foot fracture with orif  07/2004    Dr. Sharol Given  . Eye surgery  2013     Family History  Problem Relation Age of Onset  . Breast cancer      aunt  . Diabetes      aunt,brother,uncle  . Kidney disease      father  . Hyperlipidemia Sister   . Diabetes Brother   . Hypertension Mother   . Anemia Mother   . Kidney disease Father   . Dementia Maternal Grandmother    Social History:  reports that she has never  smoked. She has never used smokeless tobacco. She reports that she does not drink alcohol or use illicit drugs.  Allergies:  Allergies  Allergen Reactions  . Morphine Other (See Comments)    REACTION: lethargy/malaise  . Metoclopramide Hcl Anxiety and Other (See Comments)    REACTION: anxious and out of her mind  . Penicillins Itching and Rash    Has patient had a PCN reaction causing immediate rash, facial/tongue/throat swelling, SOB or lightheadedness with hypotension: No Has patient had a PCN reaction causing severe rash involving mucus membranes or skin necrosis: No Has patient had a PCN reaction that required hospitalization No Has patient had a PCN reaction occurring within the last 10 years: Yes If all of the above answers are "NO", then may proceed with Cephalosporin use.    . Prednisone Itching and Rash  . Sulfonamide Derivatives Itching and Rash    Medications:                                                                                                                           I have reviewed the patient's current medications.  ROS:                                                                                                                                       History obtained from the patient  General ROS: negative for - chills, fatigue, fever, night sweats, weight gain or weight loss Psychological ROS: negative for - behavioral disorder, hallucinations, memory difficulties, mood swings or suicidal ideation Ophthalmic ROS: negative for - blurry vision, double vision, eye pain or loss of vision ENT ROS: negative for -  epistaxis, nasal discharge, oral lesions, sore throat, tinnitus or vertigo Allergy and Immunology ROS: negative for - hives or itchy/watery eyes Hematological and Lymphatic ROS: negative for - bleeding problems, bruising or swollen lymph nodes Endocrine ROS: negative for - galactorrhea, hair pattern changes, polydipsia/polyuria or temperature intolerance Respiratory ROS: negative for - cough, hemoptysis, shortness of breath or wheezing Cardiovascular ROS: negative for - chest pain, dyspnea on exertion, edema or irregular heartbeat Gastrointestinal ROS: negative for - abdominal pain, diarrhea, hematemesis, nausea/vomiting or stool incontinence Genito-Urinary ROS: negative for - dysuria, hematuria, incontinence or urinary frequency/urgency Musculoskeletal ROS: negative for - joint swelling or muscular weakness Neurological ROS: as noted in HPI Dermatological ROS: negative for rash and skin lesion changes  Neurologic Examination:                                                                                                      Blood pressure 137/81, pulse 72, temperature 98.7 F (37.1 C), temperature source Oral, resp. rate 20, height 5\' 1"  (1.549 m), weight 59.875 kg (132 lb), SpO2 100 %.  HEENT-  Normocephalic, no lesions, without obvious abnormality.  Normal external eye and conjunctiva.  Normal TM's bilaterally.  Normal auditory canals and external ears. Normal external nose, mucus membranes and septum.  Normal pharynx. Cardiovascular- regular rate and rhythm, S1, S2 normal, no murmur, click, rub or gallop, pulses palpable throughout   Lungs- chest clear, no wheezing, rales, normal symmetric air entry, Heart exam - S1, S2 normal, no murmur, no gallop, rate regular Abdomen- soft, non-tender; bowel sounds normal; no masses,  no organomegaly Extremities- no edema Musculoskeletal-no joint tenderness, deformity or swelling Skin-warm and dry, no hyperpigmentation, vitiligo, or suspicious  lesions  Neurological Examination Mental Status: Alert, oriented, thought content appropriate.  Speech fluent without evidence of aphasia.  Able to follow 2 step commands without difficulty. Cranial Nerves: II: Discs flat bilaterally; Visual fields grossly normal, pupils equal, round, reactive to light  III,IV, VI: ptosis not present, extra-ocular motions intact bilaterally V,VII: smile symmetric, facial light touch sensation normal bilaterally VIII: hearing normal bilaterally IX,X: uvula rises symmetrically XI: bilateral shoulder shrug XII: midline tongue extension Motor: Right : Upper extremity   5/5    Left:     Upper extremity   4+/5  Lower extremity   5/5     Lower extremity   4+/5 Tone and bulk:normal tone throughout; no atrophy noted Sensory: Decreased Pinprick and light touch in the L arm/hand and L leg  Cerebellar: normal finger-to-nose,  Gait: did not test       Lab Results: Basic Metabolic Panel:  Recent Labs Lab 05/07/15 2203  NA 140  K 3.6  CL 101  CO2 26  GLUCOSE 102*  BUN 10  CREATININE 1.00  CALCIUM 9.4    Liver Function Tests:  Recent Labs Lab 05/07/15 2203  AST 22  ALT 15  ALKPHOS 89  BILITOT 0.5  PROT 6.7  ALBUMIN 3.7   No results for input(s): LIPASE, AMYLASE in the last 168 hours. No results for input(s): AMMONIA in the last 168 hours.  CBC:  Recent Labs Lab 05/07/15 2203  WBC 4.8  NEUTROABS 2.7  HGB 12.9  HCT 39.7  MCV 90.0  PLT 169    Cardiac Enzymes: No results for input(s): CKTOTAL, CKMB, CKMBINDEX, TROPONINI in the last 168 hours.  Lipid Panel: No results for input(s): CHOL, TRIG, HDL, CHOLHDL, VLDL, LDLCALC in the last 168 hours.  CBG: No results for input(s): GLUCAP in the last 168 hours.  Microbiology: Results for orders placed or performed during the hospital encounter of 03/20/15  Urine culture     Status: None   Collection Time: 03/20/15  7:41 PM  Result Value Ref Range Status   Specimen Description  URINE, RANDOM  Final   Special Requests  NONE  Final   Culture 70,000 COLONIES/ml ESCHERICHIA COLI  Final   Report Status 03/23/2015 FINAL  Final   Organism ID, Bacteria ESCHERICHIA COLI  Final      Susceptibility   Escherichia coli - MIC*    AMPICILLIN >=32 RESISTANT Resistant     CEFAZOLIN <=4 SENSITIVE Sensitive     CEFTRIAXONE <=1 SENSITIVE Sensitive     CIPROFLOXACIN >=4 RESISTANT Resistant     GENTAMICIN <=1 SENSITIVE Sensitive     IMIPENEM <=0.25 SENSITIVE Sensitive     NITROFURANTOIN <=16 SENSITIVE Sensitive     TRIMETH/SULFA >=320 RESISTANT Resistant     AMPICILLIN/SULBACTAM >=32 RESISTANT Resistant     PIP/TAZO <=4 SENSITIVE Sensitive     * 70,000 COLONIES/ml ESCHERICHIA COLI    Coagulation Studies: No results for input(s): LABPROT, INR in the last 72 hours.  Imaging: No results found.    Assessment: 75 y.o. female with a hx of TIA, GERD, HLP who presents with numbness of the L hand up to the elbow and L leg/foot which started yesterday evening at 4pm and has been getting gradually better. Currently states she is almost back to normal. She denies any weakness, SOB, chest pain and any headache.  Exam: No pronator drift noted but appears mildly weaker on the L arm and leg. Numbness on the L leg > arm  Going for [facility] now  1. HgbA1c, fasting lipid panel 2. MRI of the head , MRA head neck  of the brain without contrast 3. PT consult, OT consult, Speech consult 4. Echocardiogram 5. Carotid dopplers 6. Prophylactic therapy-None 7. Risk factor modification 8. Telemetry monitoring 9. Frequent neuro checks 10 NPO until passes stroke swallow screen

## 2015-05-08 NOTE — ED Notes (Signed)
Patient aware we need a urine, Will get the next time she go to bathroom.

## 2015-05-08 NOTE — Progress Notes (Signed)
CLARIFICATION: Pt was hospitalized in January for TIA and NOT February as stated in the HPI of my H/P  Erin Hearing, ANP

## 2015-05-08 NOTE — H&P (Signed)
[facility] History and Physical                                                                                    Paige Pena, is a 75 y.o. female  MRN: [id number]   DOB - 04/07/1940  Admit Date - 05/08/2015  Outpatient Primary MD for the patient is Annye Asa, MD  Referring MD: Claudine Mouton / ER  Consulting M.D: Cristobal Goldmann / Neurology  PMH: Past Medical History  Diagnosis Date  . Dysphonia   . Asthmatic bronchitis   . Chest pain, atypical   . Cerebrovascular disease   . Hypercholesteremia   . GERD (gastroesophageal reflux disease)   . Acute cystitis   . Esophageal stricture   . Gastroparesis   . Diverticulosis of colon   . IBS (irritable bowel syndrome)   . Hemorrhoids   . Urinary incontinence   . Hematuria, microscopic   . DJD (degenerative joint disease)   . Fibromyalgia   . Anxiety   . Vitamin D deficiency   . RLS (restless legs syndrome)   . Aphasia     transsient aphasia Jan 2012  . Memory loss   . Memory deficits 02/15/2013  . TIA (transient ischemic attack)       PSH: Past Surgical History  Procedure Laterality Date  . Abdominal hysterectomy  1995  . Corneal transplant for keratonconus    . Right bunion surgery    . Left foot fracture with orif  07/2004    Dr. Sharol Given  . Eye surgery  2013     CC:  Chief Complaint  Patient presents with  . Numbness     HPI: 75 year old female patient with history of TIA admission the 20 14th 2017, GERD, dyslipidemia, overactive bladder (recent Escherichia coli UTI during February admission) who presented with onset of left hand numbness as well as left leg numbness associated with expressive aphasia. Patient reports that since her last discharge she would have similar symptoms of the hand on the left "feeling funny" but nothing as significant as yesterday symptoms. Current preadmission changes were similar to previous symptomatology during February admission. She reports that the symptoms began about 4 PM  yesterday on 3/13 and wax and wane throughout the day. She also had nonspecific epigastric discomfort. She currently now is complaining of a headache but states her neurological symptoms have resolved. She has not started any new medications. She is not had any constitutional symptoms.  ER Evaluation and treatment: Afebrile, BP 136/75, pulse 72, respirations 16, room air saturations 100% EKG: Sinus rhythm, ventricular rate 61 bpm, QTC 424 ms, no ischemic changes. Unchanged from previous EKG. CT head without contrast: No acute changes Laboratory data: Na 142, K 3.6, BUN 14, Cr 1.11, troponin 0.00, WBC 4.400, Hgb 12.9, platelets 161,000, coags WNL, alcohol less than 5; hemoglobin A1c (in January) 5.6  Review of Systems   In addition to the HPI above,  No Fever-chills, myalgias or other constitutional symptoms No Headache, changes with Vision or hearing No problems swallowing food or Liquids No Chest pain, Cough or Shortness of Breath, palpitations, orthopnea or DOE No Abdominal pain, N/V; no melena or hematochezia, no dark  tarry stools No dysuria, hematuria or flank pain No new skin rashes, lesions, masses or bruises, No new joints pains-aches No recent weight gain or loss No polyuria, polydypsia or polyphagia,  *A full 10 point Review of Systems was done, except as stated above, all other Review of Systems were negative.  Social History Social History  Substance Use Topics  . Smoking status: Never Smoker   . Smokeless tobacco: Never Used  . Alcohol Use: No    Resides at: Private residence  Lives with: Spouse  Ambulatory status: Without assistive devices   Family History Family History  Problem Relation Age of Onset  . Breast cancer      aunt  . Diabetes      aunt,brother,uncle  . Kidney disease      father  . Hyperlipidemia Sister   . Diabetes Brother   . Hypertension Mother   . Anemia Mother   . Kidney disease Father   . Dementia Maternal Grandmother      Prior  to Admission medications   Medication Sig Start Date End Date Taking? Authorizing Provider  ALPRAZolam (XANAX) 0.5 MG tablet TAKE  1/2 -1 TABLET BY MOUTH THREE TIMES DAILY AS NEEDED Patient taking differently: Take 0.5 mg by mouth 3 (three) times daily as needed for anxiety. TAKE  1/2 -1 TABLET BY MOUTH THREE TIMES DAILY AS NEEDED 05/23/13  Yes [name], MD  aspirin (ECOTRIN) 325 MG EC tablet Take 325 mg by mouth daily.     Yes Historical Provider, MD  azelastine (ASTELIN) 0.1 % nasal spray Place 2 sprays into both nostrils at bedtime as needed for rhinitis. Use in each nostril as directed 10/13/14  Yes Colon Branch, MD  DULoxetine (CYMBALTA) 20 MG capsule TAKE ONE CAPSULE BY MOUTH DAILY 05/03/15  Yes [name], MD  fluticasone (FLONASE) 50 MCG/ACT nasal spray Place 2 sprays into both nostrils daily. Patient taking differently: Place 2 sprays into both nostrils daily as needed for allergies.  10/05/14  Yes [name], MD  Iron-Vitamins (GERITOL) LIQD Take 15 mLs by mouth daily.   Yes Historical Provider, MD  KRILL OIL OMEGA-3 PO Take 1 capsule by mouth daily.   Yes Historical Provider, MD  loratadine (CLARITIN) 10 MG tablet Take 1 tablet (10 mg total) by mouth daily. Patient taking differently: Take 10 mg by mouth daily as needed for allergies.  10/05/14  Yes [name], MD  omeprazole (PRILOSEC) 40 MG capsule TAKE ONE CAPSULE BY MOUTH DAILY 10/05/14  Yes [name], MD  prednisoLONE acetate (PRED FORTE) 1 % ophthalmic suspension Place 1 drop into both eyes daily.  12/22/12  Yes Historical Provider, MD  rosuvastatin (CRESTOR) 5 MG tablet Take 1 tablet by mouth three days each week. Patient taking differently: Take 5 mg by mouth 2 (two) times a week. Tuesday and Saturday 06/20/14  Yes Josue Hector, MD  tolterodine (DETROL LA) 4 MG 24 hr capsule Take 1 capsule (4 mg total) by mouth at bedtime. 10/05/14  Yes [name], MD  Acetaminophen (TYLENOL) 325 MG CAPS Take  325 mg by mouth 2 (two) times daily. Patient not taking: Reported on 05/08/2015 03/26/15   Larey Seat, MD  dicyclomine (BENTYL) 20 MG tablet Take 1 tablet (20 mg total) by mouth 4 (four) times daily -  before meals and at bedtime. Patient not taking: Reported on 05/08/2015 02/27/15   [name], MD  ondansetron (ZOFRAN) 4 MG tablet Take 1 tablet (  4 mg total) by mouth every 8 (eight) hours as needed for nausea or vomiting. Patient not taking: Reported on 05/08/2015 02/15/15   [name], MD  Phosphatidylserine-DHA-EPA (VAYACOG) 100-19.5-6.5 MG CAPS Take 1 tablet by mouth daily. Patient not taking: Reported on 05/08/2015 04/10/15   Kathrynn Ducking, MD  ranitidine (ZANTAC) 300 MG tablet Take 1 tablet (300 mg total) by mouth at bedtime. Patient not taking: Reported on 05/08/2015 02/15/15   [name], MD  sucralfate (CARAFATE) 1 G tablet Take 1 tablet (1 g total) by mouth 4 (four) times daily -  with meals and at bedtime. Patient not taking: Reported on 05/08/2015 02/15/15   [name], MD    Allergies  Allergen Reactions  . Morphine Other (See Comments)    REACTION: lethargy/malaise  . Metoclopramide Hcl Anxiety and Other (See Comments)    REACTION: anxious and out of her mind  . Penicillins Itching and Rash    Has patient had a PCN reaction causing immediate rash, facial/tongue/throat swelling, SOB or lightheadedness with hypotension: No Has patient had a PCN reaction causing severe rash involving mucus membranes or skin necrosis: No Has patient had a PCN reaction that required hospitalization No Has patient had a PCN reaction occurring within the last 10 years: Yes If all of the above answers are "NO", then may proceed with Cephalosporin use.    . Prednisone Itching and Rash  . Sulfonamide Derivatives Itching and Rash    Physical Exam  Vitals  Blood pressure 121/58, pulse 70, temperature 98.7 F (37.1 C), temperature source Oral, resp. rate 20, height  5\' 1"  (1.549 m), weight 132 lb (59.875 kg), SpO2 100 %.   General:  In no acute distress, appears healthy and well nourished  Psych:  Normal affect, Denies Suicidal or Homicidal ideations, Awake Alert, Oriented X 3. Speech and thought patterns are clear and appropriate, no apparent short term memory deficits  Neuro:   No focal neurological deficits, CN II through XII intact, Strength 5/5 all 4 extremities, Sensation intact all 4 extremities.  ENT:  Ears and Eyes appear Normal, Conjunctivae clear, PER. Moist oral mucosa without erythema or exudates.  Neck:  Supple, No lymphadenopathy appreciated  Respiratory:  Symmetrical chest wall movement, Good air movement bilaterally, CTAB. Room Air  Cardiac:  RRR, No Murmurs, no LE edema noted, no JVD, No carotid bruits, peripheral pulses palpable at 2+  Abdomen:  Positive bowel sounds, Soft, Non tender, Non distended,  No masses appreciated, no obvious hepatosplenomegaly  Skin:  No Cyanosis, Normal Skin Turgor, No Skin Rash or Bruise.  Extremities: Symmetrical without obvious trauma or injury,  no effusions.  Data Review  CBC  Recent Labs Lab 05/07/15 2203 05/08/15 0559 05/08/15 0614  WBC 4.8  --  4.4  HGB 12.9 14.3 12.9  HCT 39.7 42.0 40.3  PLT 169  --  161  MCV 90.0  --  90.4  MCH 29.3  --  28.9  MCHC 32.5  --  32.0  RDW 12.9  --  13.0  LYMPHSABS 1.6  --  1.9  MONOABS 0.3  --  0.4  EOSABS 0.1  --  0.1  BASOSABS 0.0  --  0.0    Chemistries   Recent Labs Lab 05/07/15 2203 05/08/15 0559  NA 140 142  K 3.6 3.6  CL 101 105  CO2 26  --   GLUCOSE 102* 101*  BUN 10 14  CREATININE 1.00 0.90  CALCIUM 9.4  --  AST 22  --   ALT 15  --   ALKPHOS 89  --   BILITOT 0.5  --     estimated creatinine clearance is 44.8 mL/min (by C-G formula based on Cr of 0.9).  No results for input(s): TSH, T4TOTAL, T3FREE, THYROIDAB in the last 72 hours.  Invalid input(s): FREET3  Coagulation profile  Recent Labs Lab 05/08/15 0614    INR 1.10    No results for input(s): DDIMER in the last 72 hours.  Cardiac Enzymes No results for input(s): CKMB, TROPONINI, MYOGLOBIN in the last 168 hours.  Invalid input(s): CK  Invalid input(s): POCBNP  Urinalysis    Component Value Date/Time   COLORURINE YELLOW 03/20/2015 1941   APPEARANCEUR CLEAR 03/20/2015 1941   LABSPEC 1.027 03/20/2015 1941   PHURINE 5.5 03/20/2015 1941   GLUCOSEU NEGATIVE 03/20/2015 1941   GLUCOSEU NEGATIVE 09/21/2009 1556   HGBUR MODERATE* 03/20/2015 1941   BILIRUBINUR NEGATIVE 03/20/2015 1941   KETONESUR NEGATIVE 03/20/2015 1941   PROTEINUR NEGATIVE 03/20/2015 1941   UROBILINOGEN 1.0 01/29/2011 0247   NITRITE NEGATIVE 03/20/2015 1941   LEUKOCYTESUR SMALL* 03/20/2015 1941    Imaging results:   Ct Head Wo Contrast  05/08/2015  CLINICAL DATA:  Episode of left-sided numbness and confusion with aphasia yesterday. EXAM: CT HEAD WITHOUT CONTRAST TECHNIQUE: Contiguous axial images were obtained from the base of the skull through the vertex without intravenous contrast. COMPARISON:  Head MRI 03/20/2015 and CT 03/16/2015 FINDINGS: There is no evidence of acute cortical infarct, intracranial hemorrhage, mass, midline shift, or extra-axial fluid collection. Ventricles and sulci are within normal limits for age. Crowding at the foramen magnum is consistent with previously described cerebellar tonsillar ectopia, more notable on the left and more fully evaluated on prior MRI. Periventricular white-matter hypodensities are nonspecific but compatible with mild chronic small vessel ischemic disease. Chronic, small bilateral cerebellar infarcts are again noted. Orbits are unremarkable. The visualized paranasal sinuses and mastoid air cells are clear. No skull fracture. IMPRESSION: 1. No evidence of acute intracranial abnormality. 2. Chronic small vessel ischemic changes as above. Electronically Signed   By: Logan Bores M.D.   On: 05/08/2015 07:08     EKG:  (Independently reviewed)  Sinus rhythm, ventricular rate 61 bpm, QTC 424 ms, no ischemic changes. Unchanged from previous EKG.   Assessment & Plan  Principal Problem:   TIA  -Similar presentation as in February but symptoms definitely concerning for TIA-currently has returned to neurological baseline -Admit to telemetry/Obs -Check stat MRI brain with MRA head and neck -Unless positive for stroke no indication to repeat echocardiogram this admission -Hemoglobin A1c and lipid panel previously checked within the past 3 months so no indication to repeat during this admission -Continue aspirin full dose for antiplatelet-defer to neurology if need to add Plavix or other similar agent -PT/OT/SLP  Active Problems:   HYPERCHOLESTEROLEMIA -Was on omega-3 fatty acids and a staggered dose of Crestor prior to admission -In January cholesterol was 205 with triglyceride 75, HDL cholesterol 73 and LDL cholesterol 117    ESOPHAGEAL STRICTURE/GERD -Patient had an episode of epigastric discomfort yesterday during symptoms -Continue preadmission medication    Anxiety and depression -Continue preadmission medications including as needed Xanax    Overactive bladder -Previous admission patient had asymptomatic Escherichia coli UTI so we'll check urinalysis and culture -Continue Detrol    DVT Prophylaxis: Lovenox  Family Communication:  Husband at bedside  Code Status:  Full code  Condition:  Stable  Discharge disposition: Once medically  stable in TIA evaluation completed anticipate discharge back to previous home environment-not anticipate need for home health services at this juncture  Time spent in minutes : 60      Freman Lapage L. ANP on 05/08/2015 at 7:39 AM  You may contact me by going to [email or web address] - password [email or web address]  I am available from 7a-7p but please confirm I am on the schedule by going to Amion as above.   After 7p please contact night coverage person covering me after  hours  [facility]

## 2015-05-08 NOTE — ED Notes (Signed)
Attempted report 

## 2015-05-08 NOTE — ED Notes (Signed)
Admitting Provider at the bedside.  

## 2015-05-09 ENCOUNTER — Observation Stay (HOSPITAL_COMMUNITY): Payer: Medicare Other

## 2015-05-09 DIAGNOSIS — G459 Transient cerebral ischemic attack, unspecified: Secondary | ICD-10-CM

## 2015-05-09 DIAGNOSIS — F418 Other specified anxiety disorders: Secondary | ICD-10-CM | POA: Diagnosis not present

## 2015-05-09 DIAGNOSIS — R200 Anesthesia of skin: Secondary | ICD-10-CM

## 2015-05-09 DIAGNOSIS — E785 Hyperlipidemia, unspecified: Secondary | ICD-10-CM | POA: Insufficient documentation

## 2015-05-09 LAB — URINE CULTURE

## 2015-05-09 NOTE — Discharge Summary (Signed)
Physician Discharge Summary  Paige Pena [id number] DOB: 06-May-1940 DOA: 05/08/2015  PCP: Annye Asa, MD  Admit date: 05/08/2015 Discharge date: 05/09/2015  Time spent: > 35 minutes  Recommendations for Outpatient Follow-up:  1. Please help set up outpatient physical therapy   Discharge Diagnoses:  Principal Problem:   TIA (transient ischemic attack) Active Problems:   HYPERCHOLESTEROLEMIA   ESOPHAGEAL STRICTURE   GERD   Anxiety and depression   Overactive bladder   Cervical spondylitis (HCC)   Discharge Condition: stable  Diet recommendation: Heart healthy  Filed Weights   05/07/15 2137  Weight: 59.875 kg (132 lb)    History of present illness:  75 y.o. female with a Past Medical History of Asthma, TIA, IBS, fibromyalgia, RLS, aphasia, who presents with LLE numbness and expressive aphasia. Sx now resolved  Hospital Course:  Chronic intermittent left arm numbness and left leg heaviness - Neurology consulted who recommended the following:  MRI no acute stroke  MRA head Mild intracranial atherosclerosis  MRA neck Unremarkable  MRI C-spine pending  EEG pending to rule out seizure  2D Echo EF 60-65%  LDL 117 in January  HgbA1c 5.6 in January  Lovenox 40 mg sq daily for VTE prophylaxis  Diet Heart Room service appropriate?: Yes; Fluid consistency:: Thin  aspirin 325 mg daily prior to admission, now on aspirin 325 mg daily  Patient counseled to be compliant with her antithrombotic medications  Ongoing aggressive stroke risk factor management  Therapy recommendations: pending  Disposition: pending  Hypertension  Stable  BP goal normotensive  Hyperlipidemia  Home meds: Omega-3 and restor 5, resumed in hospital  LDL 117 in January, goal < 70  Continue statin at discharge  Other Stroke Risk Factors  Advanced age  Hx stroke/TIA  02/2015 . Right brain TIA with tingling in her left hand  Cervical canal stenosis -  discussed with Neurosurgeon and will have patient follow up with neurosurgeon as outpatient.  Procedures:  Please see above  Consultations:  Neurology  Discussed with Neurosurgery  Discharge Exam: Filed Vitals:   05/09/15 0926 05/09/15 1427  BP: 128/74 135/72  Pulse: 82 73  Temp: 97.9 F (36.6 C) 98.4 F (36.9 C)  Resp: 16 16    General: Pt in nad, alert and awake Cardiovascular: rrr, no rubs Respiratory: no increased wob, no wheezes  Discharge Instructions   Discharge Instructions    Call MD for:  extreme fatigue    Complete by:  As directed      Call MD for:  temperature >100.4    Complete by:  As directed      Diet - low sodium heart healthy    Complete by:  As directed      Discharge instructions    Complete by:  As directed   Please follow up with your neurologist after discharge for further evaluation and recommendations.     Increase activity slowly    Complete by:  As directed           Current Discharge Medication List    CONTINUE these medications which have NOT CHANGED   Details  ALPRAZolam (XANAX) 0.5 MG tablet TAKE  1/2 -1 TABLET BY MOUTH THREE TIMES DAILY AS NEEDED Qty: 90 tablet, Refills: 2    aspirin (ECOTRIN) 325 MG EC tablet Take 325 mg by mouth daily.      DULoxetine (CYMBALTA) 20 MG capsule TAKE ONE CAPSULE BY MOUTH DAILY Qty: 30 capsule, Refills: 3    fluticasone (FLONASE) 50 MCG/ACT  nasal spray Place 2 sprays into both nostrils daily. Qty: 16 g, Refills: 2    Iron-Vitamins (GERITOL) LIQD Take 15 mLs by mouth daily.    KRILL OIL OMEGA-3 PO Take 1 capsule by mouth daily.    omeprazole (PRILOSEC) 40 MG capsule TAKE ONE CAPSULE BY MOUTH DAILY Qty: 90 capsule, Refills: 1    prednisoLONE acetate (PRED FORTE) 1 % ophthalmic suspension Place 1 drop into both eyes daily.     rosuvastatin (CRESTOR) 5 MG tablet Take 1 tablet by mouth three days each week. Qty: 50 tablet, Refills: 1    tolterodine (DETROL LA) 4 MG 24 hr capsule Take 1  capsule (4 mg total) by mouth at bedtime. Qty: 90 capsule, Refills: 1    ranitidine (ZANTAC) 300 MG tablet Take 1 tablet (300 mg total) by mouth at bedtime. Qty: 30 tablet, Refills: 6      STOP taking these medications     azelastine (ASTELIN) 0.1 % nasal spray      loratadine (CLARITIN) 10 MG tablet      Acetaminophen (TYLENOL) 325 MG CAPS      dicyclomine (BENTYL) 20 MG tablet      ondansetron (ZOFRAN) 4 MG tablet      Phosphatidylserine-DHA-EPA (VAYACOG) 100-19.5-6.5 MG CAPS      sucralfate (CARAFATE) 1 G tablet        Allergies  Allergen Reactions  . Morphine Other (See Comments)    REACTION: lethargy/malaise  . Metoclopramide Hcl Anxiety and Other (See Comments)    REACTION: anxious and out of her mind  . Penicillins Itching and Rash    Has patient had a PCN reaction causing immediate rash, facial/tongue/throat swelling, SOB or lightheadedness with hypotension: No Has patient had a PCN reaction causing severe rash involving mucus membranes or skin necrosis: No Has patient had a PCN reaction that required hospitalization No Has patient had a PCN reaction occurring within the last 10 years: Yes If all of the above answers are "NO", then may proceed with Cephalosporin use.    . Prednisone Itching and Rash  . Sulfonamide Derivatives Itching and Rash      The results of significant diagnostics from this hospitalization (including imaging, microbiology, ancillary and laboratory) are listed below for reference.    Significant Diagnostic Studies: Ct Head Wo Contrast  05/08/2015  CLINICAL DATA:  Episode of left-sided numbness and confusion with aphasia yesterday. EXAM: CT HEAD WITHOUT CONTRAST TECHNIQUE: Contiguous axial images were obtained from the base of the skull through the vertex without intravenous contrast. COMPARISON:  Head MRI 03/20/2015 and CT 03/16/2015 FINDINGS: There is no evidence of acute cortical infarct, intracranial hemorrhage, mass, midline shift, or  extra-axial fluid collection. Ventricles and sulci are within normal limits for age. Crowding at the foramen magnum is consistent with previously described cerebellar tonsillar ectopia, more notable on the left and more fully evaluated on prior MRI. Periventricular white-matter hypodensities are nonspecific but compatible with mild chronic small vessel ischemic disease. Chronic, small bilateral cerebellar infarcts are again noted. Orbits are unremarkable. The visualized paranasal sinuses and mastoid air cells are clear. No skull fracture. IMPRESSION: 1. No evidence of acute intracranial abnormality. 2. Chronic small vessel ischemic changes as above. Electronically Signed   By: Logan Bores M.D.   On: 05/08/2015 07:08   Mr Jodene Nam Head Wo Contrast  05/08/2015  CLINICAL DATA:  75 year old female with left hand/ leg numbness, confusion and expressive aphasia. Subsequent encounter. EXAM: MRI HEAD WITHOUT AND WITH CONTRAST AND MRA HEAD  WITHOUT CONTRAST AND MRI NECK WITHOUT AND WITH CONTRAST TECHNIQUE: Multiplanar, multiecho pulse sequences of the brain and surrounding structures were obtained without and with intravenous contrast. Angiographic images of the head were obtained using MRA technique without contrast. Multiplanar, multiecho pulse sequences of the neck and surrounding structures were obtained without and with intravenous contrast. CONTRAST:  18mL MULTIHANCE GADOBENATE DIMEGLUMINE 529 MG/ML IV SOLN COMPARISON:  05/08/2015 CT. 03/22/2015 MRA. 03/20/2015 and 03/08/2010 brain MR. FINDINGS: MRI HEAD FINDINGS Exam is motion degraded. No acute infarct or intracranial hemorrhage. Remote small cerebellar infarcts. Mild chronic small vessel disease changes. Mild global atrophy without hydrocephalus. No intracranial mass or abnormal enhancement. Cervical spondylotic changes with spinal stenosis and cord flattening (C3-4 thru C5-6) incompletely assessed on present exam. Cervical spine MR can be obtained further delineation  if clinically desired. Mild cerebellar tonsillar ectopia. Slight impression upon the ventral cervical medullary junction by the transverse ligament. Findings without change since 2012 MR. Orbital structures unremarkable. MRA HEAD FINDINGS Ectatic left internal carotid artery extending to level of left carotid terminus. Mild ectasia right internal carotid artery. Fetal contribution to the posterior cerebral arteries bilaterally. Aplastic A1 segment right anterior cerebral artery. Mild narrowing and irregularity middle cerebral artery branch vessels and A2 segment anterior cerebral artery (greater on the right). Left vertebral artery is dominant. Right vertebral artery is small and narrowed after takeoff of the right posterior inferior cerebellar artery. Mild irregularity and narrowing posterior inferior cerebellar artery bilaterally. Ectatic basilar artery with mild narrowing and irregularity. Nonvisualized left anterior inferior cerebellar artery. Fetal origin of the right posterior cerebral artery. Mild ectasia proximal left posterior cerebral artery without focal aneurysm. Posterior cerebral artery distal branch vessel mild narrowing and irregularity bilaterally. MRI NECK FINDINGS Innominate artery and left common carotid artery with either common origin or close origin. Mild irregularity aortic arch. Ectatic common carotid artery bilaterally. No hemodynamically significant stenosis of either carotid bifurcation. Mild narrowing subclavian artery bilaterally. Left vertebral artery is dominant. Mild irregularity of the vertebral arteries without high-grade stenosis. Minimal bulge right vertebral artery C2 segment may be artifact. IMPRESSION: MRI HEAD Exam is motion degraded. No acute infarct or intracranial hemorrhage. Remote small cerebellar infarcts. Mild chronic small vessel disease changes. Mild global atrophy without hydrocephalus. No intracranial mass or abnormal enhancement. Cervical spondylotic changes with  spinal stenosis and cord flattening (C3-4 thru C5-6) incompletely assessed on present exam. Cervical spine MR can be obtained further delineation if clinically desired. Mild cerebellar tonsillar ectopia. Slight impression upon the ventral cervical medullary junction by the transverse ligament. Findings without change since 2012 MR. MRA HEAD Ectatic left internal carotid artery extending to level of left carotid terminus. Mild ectasia right internal carotid artery. Aplastic A1 segment right anterior cerebral artery. Mild narrowing and irregularity middle cerebral artery branch vessels and A2 segment anterior cerebral artery (greater on the right). Left vertebral artery is dominant. Right vertebral artery is small and narrowed after takeoff of the right posterior inferior cerebellar artery. Mild irregularity and narrowing posterior inferior cerebellar artery bilaterally. Ectatic basilar artery with mild narrowing and irregularity. Nonvisualized left anterior inferior cerebellar artery. Fetal origin of the right posterior cerebral artery. Mild ectasia proximal left posterior cerebral artery without focal aneurysm. Posterior cerebral artery distal branch vessel mild narrowing and irregularity bilaterally. MRI NECK No evidence of hemodynamically significant stenosis involving either carotid bifurcation. Left vertebral artery is dominant. Electronically Signed   By: Genia Del M.D.   On: 05/08/2015 11:46   Mr Angiogram Neck W Wo Contrast  05/08/2015  CLINICAL DATA:  75 year old female with left hand/ leg numbness, confusion and expressive aphasia. Subsequent encounter. EXAM: MRI HEAD WITHOUT AND WITH CONTRAST AND MRA HEAD WITHOUT CONTRAST AND MRI NECK WITHOUT AND WITH CONTRAST TECHNIQUE: Multiplanar, multiecho pulse sequences of the brain and surrounding structures were obtained without and with intravenous contrast. Angiographic images of the head were obtained using MRA technique without contrast. Multiplanar,  multiecho pulse sequences of the neck and surrounding structures were obtained without and with intravenous contrast. CONTRAST:  68mL MULTIHANCE GADOBENATE DIMEGLUMINE 529 MG/ML IV SOLN COMPARISON:  05/08/2015 CT. 03/22/2015 MRA. 03/20/2015 and 03/08/2010 brain MR. FINDINGS: MRI HEAD FINDINGS Exam is motion degraded. No acute infarct or intracranial hemorrhage. Remote small cerebellar infarcts. Mild chronic small vessel disease changes. Mild global atrophy without hydrocephalus. No intracranial mass or abnormal enhancement. Cervical spondylotic changes with spinal stenosis and cord flattening (C3-4 thru C5-6) incompletely assessed on present exam. Cervical spine MR can be obtained further delineation if clinically desired. Mild cerebellar tonsillar ectopia. Slight impression upon the ventral cervical medullary junction by the transverse ligament. Findings without change since 2012 MR. Orbital structures unremarkable. MRA HEAD FINDINGS Ectatic left internal carotid artery extending to level of left carotid terminus. Mild ectasia right internal carotid artery. Fetal contribution to the posterior cerebral arteries bilaterally. Aplastic A1 segment right anterior cerebral artery. Mild narrowing and irregularity middle cerebral artery branch vessels and A2 segment anterior cerebral artery (greater on the right). Left vertebral artery is dominant. Right vertebral artery is small and narrowed after takeoff of the right posterior inferior cerebellar artery. Mild irregularity and narrowing posterior inferior cerebellar artery bilaterally. Ectatic basilar artery with mild narrowing and irregularity. Nonvisualized left anterior inferior cerebellar artery. Fetal origin of the right posterior cerebral artery. Mild ectasia proximal left posterior cerebral artery without focal aneurysm. Posterior cerebral artery distal branch vessel mild narrowing and irregularity bilaterally. MRI NECK FINDINGS Innominate artery and left common  carotid artery with either common origin or close origin. Mild irregularity aortic arch. Ectatic common carotid artery bilaterally. No hemodynamically significant stenosis of either carotid bifurcation. Mild narrowing subclavian artery bilaterally. Left vertebral artery is dominant. Mild irregularity of the vertebral arteries without high-grade stenosis. Minimal bulge right vertebral artery C2 segment may be artifact. IMPRESSION: MRI HEAD Exam is motion degraded. No acute infarct or intracranial hemorrhage. Remote small cerebellar infarcts. Mild chronic small vessel disease changes. Mild global atrophy without hydrocephalus. No intracranial mass or abnormal enhancement. Cervical spondylotic changes with spinal stenosis and cord flattening (C3-4 thru C5-6) incompletely assessed on present exam. Cervical spine MR can be obtained further delineation if clinically desired. Mild cerebellar tonsillar ectopia. Slight impression upon the ventral cervical medullary junction by the transverse ligament. Findings without change since 2012 MR. MRA HEAD Ectatic left internal carotid artery extending to level of left carotid terminus. Mild ectasia right internal carotid artery. Aplastic A1 segment right anterior cerebral artery. Mild narrowing and irregularity middle cerebral artery branch vessels and A2 segment anterior cerebral artery (greater on the right). Left vertebral artery is dominant. Right vertebral artery is small and narrowed after takeoff of the right posterior inferior cerebellar artery. Mild irregularity and narrowing posterior inferior cerebellar artery bilaterally. Ectatic basilar artery with mild narrowing and irregularity. Nonvisualized left anterior inferior cerebellar artery. Fetal origin of the right posterior cerebral artery. Mild ectasia proximal left posterior cerebral artery without focal aneurysm. Posterior cerebral artery distal branch vessel mild narrowing and irregularity bilaterally. MRI NECK No  evidence of hemodynamically significant stenosis involving either  carotid bifurcation. Left vertebral artery is dominant. Electronically Signed   By: Genia Del M.D.   On: 05/08/2015 11:46   Mr Jeri Cos [id number] Contrast  05/08/2015  CLINICAL DATA:  75 year old female with left hand/ leg numbness, confusion and expressive aphasia. Subsequent encounter. EXAM: MRI HEAD WITHOUT AND WITH CONTRAST AND MRA HEAD WITHOUT CONTRAST AND MRI NECK WITHOUT AND WITH CONTRAST TECHNIQUE: Multiplanar, multiecho pulse sequences of the brain and surrounding structures were obtained without and with intravenous contrast. Angiographic images of the head were obtained using MRA technique without contrast. Multiplanar, multiecho pulse sequences of the neck and surrounding structures were obtained without and with intravenous contrast. CONTRAST:  80mL MULTIHANCE GADOBENATE DIMEGLUMINE 529 MG/ML IV SOLN COMPARISON:  05/08/2015 CT. 03/22/2015 MRA. 03/20/2015 and 03/08/2010 brain MR. FINDINGS: MRI HEAD FINDINGS Exam is motion degraded. No acute infarct or intracranial hemorrhage. Remote small cerebellar infarcts. Mild chronic small vessel disease changes. Mild global atrophy without hydrocephalus. No intracranial mass or abnormal enhancement. Cervical spondylotic changes with spinal stenosis and cord flattening (C3-4 thru C5-6) incompletely assessed on present exam. Cervical spine MR can be obtained further delineation if clinically desired. Mild cerebellar tonsillar ectopia. Slight impression upon the ventral cervical medullary junction by the transverse ligament. Findings without change since 2012 MR. Orbital structures unremarkable. MRA HEAD FINDINGS Ectatic left internal carotid artery extending to level of left carotid terminus. Mild ectasia right internal carotid artery. Fetal contribution to the posterior cerebral arteries bilaterally. Aplastic A1 segment right anterior cerebral artery. Mild narrowing and irregularity middle cerebral  artery branch vessels and A2 segment anterior cerebral artery (greater on the right). Left vertebral artery is dominant. Right vertebral artery is small and narrowed after takeoff of the right posterior inferior cerebellar artery. Mild irregularity and narrowing posterior inferior cerebellar artery bilaterally. Ectatic basilar artery with mild narrowing and irregularity. Nonvisualized left anterior inferior cerebellar artery. Fetal origin of the right posterior cerebral artery. Mild ectasia proximal left posterior cerebral artery without focal aneurysm. Posterior cerebral artery distal branch vessel mild narrowing and irregularity bilaterally. MRI NECK FINDINGS Innominate artery and left common carotid artery with either common origin or close origin. Mild irregularity aortic arch. Ectatic common carotid artery bilaterally. No hemodynamically significant stenosis of either carotid bifurcation. Mild narrowing subclavian artery bilaterally. Left vertebral artery is dominant. Mild irregularity of the vertebral arteries without high-grade stenosis. Minimal bulge right vertebral artery C2 segment may be artifact. IMPRESSION: MRI HEAD Exam is motion degraded. No acute infarct or intracranial hemorrhage. Remote small cerebellar infarcts. Mild chronic small vessel disease changes. Mild global atrophy without hydrocephalus. No intracranial mass or abnormal enhancement. Cervical spondylotic changes with spinal stenosis and cord flattening (C3-4 thru C5-6) incompletely assessed on present exam. Cervical spine MR can be obtained further delineation if clinically desired. Mild cerebellar tonsillar ectopia. Slight impression upon the ventral cervical medullary junction by the transverse ligament. Findings without change since 2012 MR. MRA HEAD Ectatic left internal carotid artery extending to level of left carotid terminus. Mild ectasia right internal carotid artery. Aplastic A1 segment right anterior cerebral artery. Mild  narrowing and irregularity middle cerebral artery branch vessels and A2 segment anterior cerebral artery (greater on the right). Left vertebral artery is dominant. Right vertebral artery is small and narrowed after takeoff of the right posterior inferior cerebellar artery. Mild irregularity and narrowing posterior inferior cerebellar artery bilaterally. Ectatic basilar artery with mild narrowing and irregularity. Nonvisualized left anterior inferior cerebellar artery. Fetal origin of the right posterior cerebral artery. Mild ectasia proximal  left posterior cerebral artery without focal aneurysm. Posterior cerebral artery distal branch vessel mild narrowing and irregularity bilaterally. MRI NECK No evidence of hemodynamically significant stenosis involving either carotid bifurcation. Left vertebral artery is dominant. Electronically Signed   By: Genia Del M.D.   On: 05/08/2015 11:46   Mr Cervical Spine Wo Contrast  05/08/2015  CLINICAL DATA:  75 year old female with numbness and slurred speech. Left hand and left leg numbness. Subsequent encounter. EXAM: MRI CERVICAL SPINE WITHOUT CONTRAST TECHNIQUE: Multiplanar, multisequence MR imaging of the cervical spine was performed. No intravenous contrast was administered. COMPARISON:  Brain MR and head CT 05/08/2015. No comparison cervical spine MR. FINDINGS: Exam is motion degraded. Cerebellar tonsil location within the range of normal limits. Minimal prominence of the transverse ligament with slight narrowing of the ventral aspect of the thecal sac without cord compression. No focal cervical cord signal abnormality. No worrisome paravertebral abnormality. No focal osseous abnormality. C2-3:  Minimal bulge.  Mild narrowing ventral thecal sac. C3-4: Broad-based disc osteophyte complex greater to left. Posterior element hypertrophy. Mild to moderate left sided and mild right-sided spinal stenosis. Cord flattening greater on left. Moderate left foraminal narrowing  C4-5: Facet degenerative changes. Minimal anterior slip C4. Bulge. Mild narrowing ventral thecal sac. Minimal foraminal narrowing. C5-6: Broad-based disc osteophyte complex. Moderate spinal stenosis with mild cord flattening. Uncinate hypertrophy. Moderate to marked left-sided and mild right-sided foraminal narrowing. C6-7: Broad-based disc osteophyte complex greater to the right. Spinal stenosis and cord flattening greater on the right. Uncinate hypertrophy with moderate right-sided and mild left-sided foraminal narrowing. C7-T1: Mild facet degenerative changes. Minimal bulge. No significant spinal stenosis or foraminal narrowing. T1-2: Facet degenerative changes. Disc osteophyte extends into the neural foramen with moderate-to-marked foraminal narrowing greater on the left. Narrowing ventral thecal sac without cord compression. T2-3: Disc osteophyte complex with mild narrowing ventral thecal sac without cord contact. Facet degenerative changes with mild bilateral foraminal narrowing. T3-4: Disc osteophyte complex. Narrowing ventral thecal sac. Mild bilateral foraminal narrowing. IMPRESSION: Cervical spondylotic changes most prominent C3-4, C5-6 and C6-7. Summary pertinent findings includes: C3-4 broad-based disc osteophyte complex greater to left. Posterior element hypertrophy. Mild to moderate left sided and mild right-sided spinal stenosis. Cord flattening greater on left. Moderate left foraminal narrowing. C4-5 bulge.  Mild narrowing ventral thecal sac. C5-6 broad-based disc osteophyte complex. Moderate spinal stenosis with mild cord flattening. Moderate to marked left-sided and mild right-sided foraminal narrowing. C6-7 broad-based disc osteophyte complex greater to the right. Spinal stenosis and cord flattening greater on the right. Moderate right-sided and mild left-sided foraminal narrowing. T1-2 moderate-to-marked foraminal narrowing greater on the left. Narrowing ventral thecal sac without cord  compression. T2-3 and T3-4 mild narrowing ventral thecal sac and mild bilateral foraminal narrowing. Please see above for further detail. Electronically Signed   By: Genia Del M.D.   On: 05/08/2015 21:54    Microbiology: Recent Results (from the past 240 hour(s))  Urine culture     Status: None   Collection Time: 05/08/15 12:40 PM  Result Value Ref Range Status   Specimen Description URINE, RANDOM  Final   Special Requests NONE  Final   Culture MULTIPLE SPECIES PRESENT, SUGGEST RECOLLECTION  Final   Report Status 05/09/2015 FINAL  Final     Labs: Basic Metabolic Panel:  Recent Labs Lab 05/07/15 2203 05/08/15 0559  NA 140 142  K 3.6 3.6  CL 101 105  CO2 26  --   GLUCOSE 102* 101*  BUN 10 14  CREATININE 1.00  0.90  CALCIUM 9.4  --    Liver Function Tests:  Recent Labs Lab 05/07/15 2203  AST 22  ALT 15  ALKPHOS 89  BILITOT 0.5  PROT 6.7  ALBUMIN 3.7   No results for input(s): LIPASE, AMYLASE in the last 168 hours. No results for input(s): AMMONIA in the last 168 hours. CBC:  Recent Labs Lab 05/07/15 2203 05/08/15 0559 05/08/15 0614  WBC 4.8  --  4.4  NEUTROABS 2.7  --  2.0  HGB 12.9 14.3 12.9  HCT 39.7 42.0 40.3  MCV 90.0  --  90.4  PLT 169  --  161   Cardiac Enzymes: No results for input(s): CKTOTAL, CKMB, CKMBINDEX, TROPONINI in the last 168 hours. BNP: BNP (last 3 results) No results for input(s): BNP in the last 8760 hours.  ProBNP (last 3 results) No results for input(s): PROBNP in the last 8760 hours.  CBG: No results for input(s): GLUCAP in the last 168 hours.   Signed:  Velvet Bathe MD.  [facility] 05/09/2015, 2:52 PM

## 2015-05-09 NOTE — Procedures (Signed)
ELECTROENCEPHALOGRAM REPORT  Date of Study: 05/09/2015  Patient's Name: Paige Pena MRN: [id number] Date of Birth: 06/13/1940  Referring Provider: Rosalin Hawking, MD  Indication: 75 y.o. female with a hx of TIA, GERD, HLP who presents with numbness of the L hand up to the elbow and L leg/foot which started yesterday evening at 4pm (LKW 05/07/2015, at 4 PM) and has been getting gradually better. States she is almost back to normal. She denies any weakness, SOB, chest pain and any headache.  Medications: ALPRAZolam (XANAX) tablet 0.5 mg aspirin EC tablet 325 mg azelastine (ASTELIN) 0.1 % nasal spray 2 spray DULoxetine (CYMBALTA) DR capsule 20 mg enoxaparin (LOVENOX) injection 40 mg fesoterodine (TOVIAZ) tablet 4 mg pantoprazole (PROTONIX) EC tablet 40 mg prednisoLONE acetate (PRED FORTE) 1 % ophthalmic suspension 1 drop rosuvastatin (CRESTOR) tablet 5 mg  Technical Summary: This is a multichannel digital EEG recording, using the international 10-20 placement system with electrodes applied with paste and impedances below 5000 ohms.    Description: The EEG background is symmetric, with a well-developed posterior dominant rhythm of 10 Hz, which is reactive to eye opening and closing.  Diffuse beta activity is seen, with a bilateral frontal preponderance.  No focal or generalized abnormalities are seen.  No focal or generalized epileptiform discharges are seen.  Stage II sleep is seen, with normal and symmetric sleep patterns.  Hyperventilation and photic stimulation were not performed.  ECG revealed normal cardiac rate and rhythm.  Impression: This is a normal routine EEG of the awake and asleep states, without activating procedures.  A normal study does not rule out the possibility of a seizure disorder in this patient.  Kirstin Kugler R. Tomi Likens, DO

## 2015-05-09 NOTE — Care Management Note (Signed)
Case Management Note  Patient Details  Name: Paige Pena MRN: [id number] Date of Birth: Feb 22, 1941  Subjective/Objective:                    Action/Plan: Patient discharging home with self care. Patient active with outpatient therapy prior to admission. Pt to call primary MD if needed to continue with outpatient therapy and patient is aware. No further needs per CM.   Expected Discharge Date:   (Pending)               Expected Discharge Plan:     In-House Referral:     Discharge planning Services     Post Acute Care Choice:    Choice offered to:     DME Arranged:    DME Agency:     HH Arranged:    HH Agency:     Status of Service:  In process, will continue to follow  Medicare Important Message Given:    Date Medicare IM Given:    Medicare IM give by:    Date Additional Medicare IM Given:    Additional Medicare Important Message give by:     If discussed at [place] of Stay Meetings, dates discussed:    Additional Comments:  Pollie Friar, RN 05/09/2015, 4:16 PM

## 2015-05-09 NOTE — Progress Notes (Signed)
STROKE TEAM PROGRESS NOTE   SUBJECTIVE (INTERVAL HISTORY) No family at bedside but discussed with pt at beside as well as her daughter over the phone. Her EEG negative. But MRI C-spine showed multifocal spinal cord flattening. Neurosurgery follow up needed. Repeat EMG/NCS as outpt with Dr. Jannifer Franklin.     OBJECTIVE Temp:  [97.6 F (36.4 C)-99.9 F (37.7 C)] 98.4 F (36.9 C) (03/15 1427) Pulse Rate:  [65-106] 73 (03/15 1427) Cardiac Rhythm:  [-] Normal sinus rhythm (03/15 0735) Resp:  [16-18] 16 (03/15 1427) BP: (97-156)/(65-86) 135/72 mmHg (03/15 1427) SpO2:  [93 %-99 %] 99 % (03/15 1427)  CBC:   Recent Labs Lab 05/07/15 2203 05/08/15 0559 05/08/15 0614  WBC 4.8  --  4.4  NEUTROABS 2.7  --  2.0  HGB 12.9 14.3 12.9  HCT 39.7 42.0 40.3  MCV 90.0  --  90.4  PLT 169  --  [id number]    Basic Metabolic Panel:   Recent Labs Lab 05/07/15 2203 05/08/15 0559  NA 140 142  K 3.6 3.6  CL 101 105  CO2 26  --   GLUCOSE 102* 101*  BUN 10 14  CREATININE 1.00 0.90  CALCIUM 9.4  --     Lipid Panel:     Component Value Date/Time   CHOL 205* 03/21/2015 0145   TRIG 75 03/21/2015 0145   HDL 73 03/21/2015 0145   CHOLHDL 2.8 03/21/2015 0145   VLDL 15 03/21/2015 0145   LDLCALC 117* 03/21/2015 0145   HgbA1c:  Lab Results  Component Value Date   HGBA1C 5.6 03/21/2015   Urine Drug Screen:     Component Value Date/Time   LABOPIA NONE DETECTED 05/08/2015 1240   COCAINSCRNUR NONE DETECTED 05/08/2015 1240   LABBENZ NONE DETECTED 05/08/2015 1240   AMPHETMU NONE DETECTED 05/08/2015 1240   THCU NONE DETECTED 05/08/2015 1240   LABBARB NONE DETECTED 05/08/2015 1240      IMAGING I have personally reviewed the radiological images below and agree with the radiology interpretations.  Ct Head Wo Contrast  05/08/2015   IMPRESSION: 1. No evidence of acute intracranial abnormality. 2. Chronic small vessel ischemic changes as above. Electronically Signed   By: Logan Bores M.D.   On: 05/08/2015  07:08   MRI C-spine Cervical spondylotic changes most prominent C3-4, C5-6 and C6-7. Summary pertinent findings includes: C3-4 broad-based disc osteophyte complex greater to left. Posterior element hypertrophy. Mild to moderate left sided and mild right-sided spinal stenosis. Cord flattening greater on left. Moderate left foraminal narrowing. C4-5 bulge. Mild narrowing ventral thecal sac. C5-6 broad-based disc osteophyte complex. Moderate spinal stenosis with mild cord flattening. Moderate to marked left-sided and mild right-sided foraminal narrowing. C6-7 broad-based disc osteophyte complex greater to the right. Spinal stenosis and cord flattening greater on the right. Moderate right-sided and mild left-sided foraminal narrowing. T1-2 moderate-to-marked foraminal narrowing greater on the left. Narrowing ventral thecal sac without cord compression. T2-3 and T3-4 mild narrowing ventral thecal sac and mild bilateral foraminal narrowing.  MRI HEAD  05/08/2015  Exam is motion degraded. No acute infarct or intracranial hemorrhage. Remote small cerebellar infarcts. Mild chronic small vessel disease changes. Mild global atrophy without hydrocephalus. No intracranial mass or abnormal enhancement. Cervical spondylotic changes with spinal stenosis and cord flattening (C3-4 thru C5-6) incompletely assessed on present exam. Cervical spine MR can be obtained further delineation if clinically desired. Mild cerebellar tonsillar ectopia. Slight impression upon the ventral cervical medullary junction by the transverse ligament. Findings without change since 2012 MR.  MRA HEAD  05/08/2015  Ectatic left internal carotid artery extending to level of left carotid terminus. Mild ectasia right internal carotid artery. Aplastic A1 segment right anterior cerebral artery. Mild narrowing and irregularity middle cerebral artery branch vessels and A2 segment anterior cerebral artery (greater on the right). Left  vertebral artery is dominant. Right vertebral artery is small and narrowed after takeoff of the right posterior inferior cerebellar artery. Mild irregularity and narrowing posterior inferior cerebellar artery bilaterally. Ectatic basilar artery with mild narrowing and irregularity. Nonvisualized left anterior inferior cerebellar artery. Fetal origin of the right posterior cerebral artery. Mild ectasia proximal left posterior cerebral artery without focal aneurysm. Posterior cerebral artery distal branch vessel mild narrowing and irregularity bilaterally.   MRA NECK  05/08/2015  No evidence of hemodynamically significant stenosis involving either carotid bifurcation. Left vertebral artery is dominant.   2-D echocardiogram - Left ventricle: The cavity size was normal. Wall thickness was increased in a pattern of mild LVH. Systolic function was normal. The estimated ejection fraction was in the range of 60% to 65%. Wall motion was normal; there were no regional wall motion abnormalities. Doppler parameters are consistent with abnormal left ventricular relaxation (grade 1 diastolic dysfunction). - Mitral valve: There was mild regurgitation. - Left atrium: The atrium was mildly dilated.  EEG - normal EEG   PHYSICAL EXAM  Temp:  [97.6 F (36.4 C)-99.9 F (37.7 C)] 98.4 F (36.9 C) (03/15 1427) Pulse Rate:  [65-106] 73 (03/15 1427) Resp:  [16-18] 16 (03/15 1427) BP: (97-156)/(65-86) 135/72 mmHg (03/15 1427) SpO2:  [93 %-99 %] 99 % (03/15 1427)  General - Well nourished, well developed, in no apparent distress.  Ophthalmologic - Fundi not visualized due to eye movement.  Cardiovascular - Regular rate and rhythm.  Mental Status -  Level of arousal and orientation to time, place, and person were intact. Language including expression, naming, repetition, comprehension was assessed and found intact. Fund of Knowledge was assessed and was impaired.  Cranial Nerves II - XII - II - Visual field  intact OU. III, IV, VI - Extraocular movements intact. V - Facial sensation intact bilaterally. VII - Facial movement intact bilaterally. VIII - Hearing & vestibular intact bilaterally X - Palate elevates symmetrically. XI - Chin turning & shoulder shrug intact bilaterally. XII - Tongue protrusion intact.  Motor Strength - The patient's strength was normal in all extremities and pronator drift was absent.  Bulk was normal and fasciculations were absent.   Motor Tone - Muscle tone was assessed at the neck and appendages and was normal.  Reflexes - The patient's reflexes were 3+ in all extremities with unsustained b/l ankle clonus and she had no pathological reflexes.  Sensory - Light touch, temperature/pinprick were assessed and were symmetrical except subjective left LE decreased light touch about 75% to the right.    Coordination - The patient had normal movements in the hands and feet with no ataxia or dysmetria.  Tremor was absent.  Gait and Station - deferred to PT due to safety concerns   ASSESSMENT/PLAN Paige Pena is a 75 y.o. female with history of TIA, hyperlipidemia and GERD presenting with left foot and hand numbness. She did not receive IV t-PA due to resolved symptoms.   Chronic intermittent left arm numbness and left leg heaviness - likely due to C-spine DJD with cord flattening   MRI  no acute stroke  MRA head  Mild intracranial atherosclerosis  MRA neck  Unremarkable  MRI C-spine showed  multilevel spinal cord flattening.  EEG normal  2D Echo EF 60-65%  LDL 117 in January  HgbA1c 5.6 in January  Lovenox 40 mg sq daily for VTE prophylaxis Diet Heart Room service appropriate?: Yes; Fluid consistency:: Thin Diet - low sodium heart healthy  aspirin 325 mg daily prior to admission, now on aspirin 325 mg daily  Patient counseled to be compliant with her antithrombotic medications  Ongoing aggressive stroke risk factor management  Therapy  recommendations:  pending  Disposition:  Pending  C-spinal cord flattening  Likely to explain her symptoms  Seems chronic process  Recommend NSG consult inpt or referral as outpt  Continue PT/OT  Recommend pelvic floor exercise for b/b incontinence  Repeat EMG/NCS with [facility]annifer [facility] at [facility]  Hypertension  Stable  BP goal normotensive  Hyperlipidemia  Home meds:  Omega-3 and restor 5, resumed in hospital  LDL 117 in January, goal < 70  Continue statin at discharge  Other Stroke Risk Factors  Advanced age  Hx stroke/TIA  02/2015 . Right brain TIA with tingling in her left hand  Other Active Problems  Esophageal stricture/GERD  Anxiety and depression  Overactive bladder   Hospital day # 1  I had very long discussion with pt at bedside and her daughter or via the phone. Answered all their questions to their satisfaction.   Neurology will sign off. Please call with questions. Pt will follow up with Dr. Jannifer Franklin at [facility] in about 2 months. Thanks for the consult.   Rosalin Hawking, MD PhD Stroke Neurology 05/09/2015 4:10 PM      To contact Stroke Continuity provider, please refer to [email or web address] After hours, contact General Neurology

## 2015-05-09 NOTE — Progress Notes (Signed)
Pt discharged with husband to home. All of discharged orders and instructions reviewed. Pt understood. She was also shown and given written instructions and charge nurse asked to review orders with her as well.

## 2015-05-09 NOTE — Care Management Obs Status (Signed)
[place] NOTIFICATION   Patient Details  Name: TEGAN MCFARLAND MRN: [id number] Date of Birth: 05/03/1940   Medicare Observation Status Notification Given:  Yes (MRI results negative)    Pollie Friar, RN 05/09/2015, 4:15 PM

## 2015-05-09 NOTE — Progress Notes (Signed)
OT Cancellation Note  Patient Details Name: Paige Pena MRN: [id number] DOB: Nov 30, 1940   Cancelled Treatment:    Reason Eval/Treat Not Completed: OT screened, no needs identified, will sign off  Benito Mccreedy OTR/L [id number] 05/09/2015, 9:39 AM

## 2015-05-09 NOTE — Evaluation (Signed)
Physical Therapy Evaluation and Discharge Patient Details Name: Paige Pena MRN: [id number] DOB: 1940-08-19 Today's Date: 05/09/2015   History of Present Illness  Paige Pena is an 75 y.o. female with a hx of TIA, GERD, HLP who presents with numbness of the L hand up to the elbow and L leg/foot. history of TIA, GERD, dyslipidemia, overactive bladder   Clinical Impression  Patient evaluated by Physical Therapy with no further acute PT needs identified. All education has been completed and the patient has no further questions. Patient feels she is back to baseline. Minimal instability noted with higher level dynamic gait challenges. States she has been making great progress with outpatient physical therapy. Continue with OPPT at d/c. See below for any follow-up Physical Therapy or equipment needs. PT is signing off. Thank you for this referral.     Follow Up Recommendations Other (comment) (Continue outpatient physical therapy)    Equipment Recommendations  None recommended by PT    Recommendations for Other Services       Precautions / Restrictions Precautions Precautions: None Restrictions Weight Bearing Restrictions: No      Mobility  Bed Mobility Overal bed mobility: Independent                Transfers Overall transfer level: Modified independent               General transfer comment: extra time but stable  Ambulation/Gait Ambulation/Gait assistance: Modified independent (Device/Increase time) Ambulation Distance (Feet): 250 Feet Assistive device: None Gait Pattern/deviations: Step-through pattern;Decreased stride length;Decreased stance time - left Gait velocity: decreased Gait velocity interpretation: Below normal speed for age/gender General Gait Details: Slow with minimal instability initially but improved with distance. Able to complete dynamic gait tasks with fair difficulty but no overt loss of balance or need for physical assist. Completed  high marching, quick turns, vertical/horizontal head turns (scissoring gait), backwards stepping, and variable speeds (minimal increase.) Feels she is at baseline.  Stairs Stairs: Yes Stairs assistance: Modified independent (Device/Increase time) Stair Management: No rails;Step to pattern;Forwards Number of Stairs: 2 General stair comments: Steady with step-to pattern, no rail. Takes extra time but no physical assisatnce. Good control with descent despite stepping down with Rt foot first, shoging good LLE strength and eccentric control.  Wheelchair Mobility    Modified Rankin (Stroke Patients Only) Modified Rankin (Stroke Patients Only) Pre-Morbid Rankin Score: No significant disability Modified Rankin: No significant disability     Balance Overall balance assessment: No apparent balance deficits (not formally assessed)                                           Pertinent Vitals/Pain Pain Assessment: No/denies pain    Home Living Family/patient expects to be discharged to:: Private residence Living Arrangements: Spouse/significant other Available Help at Discharge: Family;Available 24 hours/day Type of Home: House Home Access: Stairs to enter Entrance Stairs-Rails: None Entrance Stairs-Number of Steps: 2 Home Layout: One level Home Equipment: None      Prior Function Level of Independence: Independent         Comments: Has been going to OPPT     Hand Dominance   Dominant Hand: Right    Extremity/Trunk Assessment   Upper Extremity Assessment: Defer to OT evaluation           Lower Extremity Assessment: LLE deficits/detail   LLE Deficits / Details: Grossly  4+/5 compared to Rt which is 5/5     Communication   Communication: No difficulties  Cognition Arousal/Alertness: Awake/alert Behavior During Therapy: WFL for tasks assessed/performed Overall Cognitive Status: Within Functional Limits for tasks assessed                       General Comments General comments (skin integrity, edema, etc.): State she is making good progress at [place]        Assessment/Plan    PT Assessment Patent does not need any further PT services  PT Diagnosis Abnormality of gait   PT Problem List    PT Treatment Interventions     PT Goals (Current goals can be found in the Care Plan section) Acute Rehab PT Goals Patient Stated Goal: Continue rehab PT Goal Formulation: All assessment and education complete, DC therapy    Frequency     Barriers to discharge        Co-evaluation               End of Session Equipment Utilized During Treatment: Gait belt Activity Tolerance: Patient tolerated treatment well Patient left: in chair;with call bell/phone within reach;with chair alarm set Nurse Communication: Mobility status    Functional Assessment Tool Used: clinical observation Functional Limitation: Mobility: Walking and moving around Mobility: Walking and Moving Around Current Status [id number]): At least 1 percent but less than 20 percent impaired, limited or restricted Mobility: Walking and Moving Around Goal Status [phone]): At least 1 percent but less than 20 percent impaired, limited or restricted Mobility: Walking and Moving Around Discharge Status [phone]): At least 20 percent but less than 40 percent impaired, limited or restricted    Time: 0851-0902 PT Time Calculation (min) (ACUTE ONLY): 11 min   Charges:   PT Evaluation $PT Eval Low Complexity: 1 Procedure     PT G Codes:   PT G-Codes **NOT FOR INPATIENT CLASS** Functional Assessment Tool Used: clinical observation Functional Limitation: Mobility: Walking and moving around Mobility: Walking and Moving Around Current Status [id number]): At least 1 percent but less than 20 percent impaired, limited or restricted Mobility: Walking and Moving Around Goal Status [phone]): At least 1 percent but less than 20 percent impaired, limited or  restricted Mobility: Walking and Moving Around Discharge Status [phone]): At least 20 percent but less than 40 percent impaired, limited or restricted    Ellouise Newer 05/09/2015, [place], [place]

## 2015-05-09 NOTE — Progress Notes (Signed)
EEG Completed; Results Pending  

## 2015-05-09 NOTE — Care Management Note (Signed)
Case Management Note  Patient Details  Name: Paige Pena MRN: [id number] Date of Birth: 1940-12-16  Subjective/Objective:                    Action/Plan: Patient presented with left-sided numbness and expressive aphasia. Lives at home with spouse. Will follow for discharge needs pending PT/OT evals and physician orders.  Expected Discharge Date:   (Pending)               Expected Discharge Plan:     In-House Referral:     Discharge planning Services     Post Acute Care Choice:    Choice offered to:     DME Arranged:    DME Agency:     HH Arranged:    HH Agency:     Status of Service:  In process, will continue to follow  Medicare Important Message Given:    Date Medicare IM Given:    Medicare IM give by:    Date Additional Medicare IM Given:    Additional Medicare Important Message give by:     If discussed at [place] of Stay Meetings, dates discussed:    Additional Comments:  Rolm Baptise, RN 05/09/2015, 11:24 AM [phone]

## 2015-05-09 NOTE — Evaluation (Signed)
Speech Language Pathology Evaluation Patient Details Name: Paige Pena MRN: [id number] DOB: June 22, 1940 Today's Date: 05/09/2015 Time: [id number] SLP Time Calculation (min) (ACUTE ONLY): 16 min  Problem List:  Patient Active Problem List   Diagnosis Date Noted  . Overactive bladder 05/08/2015  . Aphasia   . Cervical spondylitis ([facility])   . Numbness and tingling in left hand 03/21/2015  . TIA (transient ischemic attack) 03/21/2015  . UTI (lower urinary tract infection) 03/21/2015  . Physical exam 10/05/2014  . Abdominal pain 05/17/2014  . Atypical chest pain 05/17/2014  . Allergic rhinitis 05/17/2014  . Fatigue 04/04/2014  . Anxiety and depression 07/25/2013  . Weight loss, unintentional 07/25/2013  . Sinusitis, acute, maxillary 07/13/2013  . Cerumen impaction 07/13/2013  . Memory deficits 02/15/2013  . Lumbar spondylosis 09/21/2012  . Single episode of elevated blood pressure 09/29/2011  . Palpitations 09/16/2010  . Left leg pain 07/04/2010  . OTHER ABNORMALITY OF URINATION 09/25/2009  . DERMATITIS 03/23/2009  . Cerebrovascular disease 04/03/2008  . DYSPHONIA 04/03/2008  . CHEST PAIN, ATYPICAL 04/03/2008  . ASTHMATIC BRONCHITIS, ACUTE 04/02/2008  . DIVERTICULOSIS OF COLON 04/02/2008  . Irritable bowel syndrome 04/02/2008  . DEGENERATIVE JOINT DISEASE 04/02/2008  . HEMATURIA, MICROSCOPIC, HX OF 04/02/2008  . Vitamin D deficiency 03/28/2008  . HYPERCHOLESTEROLEMIA 03/28/2008  . FIBROMYALGIA 03/28/2008  . URINARY INCONTINENCE 03/28/2008  . ACUTE CYSTITIS 03/22/2008  . GERD 08/02/2007  . Gastroparesis 08/02/2007  . HEMORRHOIDS 07/30/2007  . ESOPHAGEAL STRICTURE 07/30/2007   Past Medical History:  Past Medical History  Diagnosis Date  . Dysphonia   . Asthmatic bronchitis   . Chest pain, atypical   . Cerebrovascular disease   . Hypercholesteremia   . GERD (gastroesophageal reflux disease)   . Acute cystitis   . Esophageal stricture   . Gastroparesis   .  Diverticulosis of colon   . IBS (irritable bowel syndrome)   . Hemorrhoids   . Urinary incontinence   . Hematuria, microscopic   . DJD (degenerative joint disease)   . Fibromyalgia   . Anxiety   . Vitamin D deficiency   . RLS (restless legs syndrome)   . Aphasia     transsient aphasia Jan 2012  . Memory loss   . Memory deficits 02/15/2013  . TIA (transient ischemic attack)    Past Surgical History:  Past Surgical History  Procedure Laterality Date  . Abdominal hysterectomy  1995  . Corneal transplant for keratonconus    . Right bunion surgery    . Left foot fracture with orif  07/2004    Dr. Sharol Given  . Eye surgery  2013   HPI:  Paige Pena is an 75 y.o. female with a hx of TIA, GERD, HLP who presents with numbness of the L hand up to the elbow and L leg/foot. history of TIA, GERD, dyslipidemia, overactive bladder    Assessment / Plan / Recommendation Clinical Impression  Patient was assessed informallly with husband in the room. Pt was asked to perform a series of task and a series of questions to assess cognition and language abilities. Pt was oriented X 4, able to recount the events of the last 3 months as related to her medical history and communicate her current concerns related to her health.  Pt was able to communicate effectively at a conversational level across all language task. Pt does not appear to have any cognitive or language deficits and requires no speech therapy at this time.     SLP Assessment  Patient needs continued Speech Lanaguage Pathology Services    Follow Up Recommendations  None               SLP Evaluation Prior Functioning  Cognitive/Linguistic Baseline: Within functional limits Type of Home: House  Lives With: Spouse Available Help at Discharge: Family;Available 24 hours/day   Cognition  Overall Cognitive Status: Within Functional Limits for tasks assessed Arousal/Alertness: Awake/alert Orientation Level: Oriented X4 Attention:  Focused;Sustained Focused Attention: Appears intact Memory: Appears intact Problem Solving: Appears intact Executive Function: Reasoning;Sequencing;Organizing;Decision Making;Initiating Reasoning: Appears intact Sequencing: Appears intact Organizing: Appears intact Decision Making: Appears intact Initiating: Appears intact Safety/Judgment: Appears intact [employer] Scales of Cognitive Functioning: Purposeful/appropriate    Comprehension  Auditory Comprehension Overall Auditory Comprehension: Appears within functional limits for tasks assessed Yes/No Questions: Within Functional Limits Commands: Within Functional Limits Conversation: Complex Visual Recognition/Discrimination Discrimination: Within Function Limits    Expression Expression Primary Mode of Expression: Verbal Verbal Expression Overall Verbal Expression: Appears within functional limits for tasks assessed Initiation: No impairment Level of Generative/Spontaneous Verbalization: Conversation Repetition: No impairment Naming: No impairment Pragmatics: No impairment   Oral / Motor  Oral Motor/Sensory Function Overall Oral Motor/Sensory Function: Within functional limits Motor Speech Overall Motor Speech: Appears within functional limits for tasks assessed Respiration: Within functional limits Phonation: Normal Resonance: Within functional limits Articulation: Within functional limitis Intelligibility: Intelligible Motor Planning: Witnin functional limits Motor Speech Errors: Not applicable   GO                   Charlynne Cousins Jaran Sainz, MA, CCC-SLP 05/09/2015 3:18 PM

## 2015-05-10 ENCOUNTER — Ambulatory Visit: Payer: Medicare Other | Admitting: Physical Therapy

## 2015-05-10 ENCOUNTER — Telehealth: Payer: Self-pay | Admitting: *Deleted

## 2015-05-10 NOTE — Telephone Encounter (Signed)
Transition Care Management Follow-up Telephone Call  PCP: Annye Asa, MD  Admit date: 05/08/2015 Discharge date: 05/09/2015  Recommendations for Outpatient Follow-up:  1. Please help set up outpatient physical therapy   Discharge Diagnoses:  Principal Problem:  TIA (transient ischemic attack) Active Problems:  HYPERCHOLESTEROLEMIA  ESOPHAGEAL STRICTURE  GERD  Anxiety and depression  Overactive bladder  Cervical spondylitis (HCC)   Discharge Condition: stable  Diet recommendation: Heart healthy   How have you been since you were released from the hospital? "Well, I'm going to say pretty good. I'm just tired...you don't get any sleep in the hospital."   Do you understand why you were in the hospital? yes   Do you understand the discharge instructions? yes   Where were you discharged to? Home   Items Reviewed:  Medications reviewed: yes  Allergies reviewed: yes  Dietary changes reviewed: yes  Referrals reviewed: yes, referral to neurosurgery   Functional Questionnaire:   Activities of Daily Living (ADLs):   She states they are independent in the following: ambulation, bathing and hygiene, feeding, continence, grooming, toileting and dressing States they require assistance with the following: None; pt has home health PT scheduled for next week   Any transportation issues/concerns?: no   Any patient concerns? Yes, pt states someone mentioned "something about a rapid heart beat" while she was in the hospital, but then nothing else was said about it. She would like to discuss if she should also follow up with cardiology since her hospitalization.   Confirmed importance and date/time of follow-up visits scheduled yes  Provider Appointment booked with Dr. Annye Asa 05/14/15 @ 11am.   Confirmed with patient if condition begins to worsen call PCP or go to the ER.  Patient was given the office number and encouraged to call back with question  or concerns.  : yes

## 2015-05-14 ENCOUNTER — Inpatient Hospital Stay: Payer: Medicare Other | Admitting: Family Medicine

## 2015-05-15 ENCOUNTER — Ambulatory Visit: Payer: Medicare Other | Admitting: Physical Therapy

## 2015-05-15 ENCOUNTER — Encounter: Payer: Self-pay | Admitting: Physical Therapy

## 2015-05-15 DIAGNOSIS — R262 Difficulty in walking, not elsewhere classified: Secondary | ICD-10-CM | POA: Diagnosis not present

## 2015-05-15 DIAGNOSIS — R531 Weakness: Secondary | ICD-10-CM

## 2015-05-15 DIAGNOSIS — G459 Transient cerebral ischemic attack, unspecified: Secondary | ICD-10-CM

## 2015-05-15 NOTE — Therapy (Signed)
[place], [place], [address] Phone: [phone]   Fax:  [phone]  Physical Therapy Treatment  Patient Details  Name: Paige Pena MRN: [id number] Date of Birth: 11/01/1940 Referring Provider: [name]  Encounter Date: 05/15/2015      PT End of Session - 05/15/15 1428    Visit Number 7   Date for PT Re-Evaluation 06/01/15   PT Start Time [id number]   PT Stop Time 1428   PT Time Calculation (min) 30 min   Activity Tolerance Patient tolerated treatment well   Behavior During Therapy [facility] for tasks assessed/performed      Past Medical History  Diagnosis Date  . Dysphonia   . Asthmatic bronchitis   . Chest pain, atypical   . Cerebrovascular disease   . Hypercholesteremia   . GERD (gastroesophageal reflux disease)   . Acute cystitis   . Esophageal stricture   . Gastroparesis   . Diverticulosis of colon   . IBS (irritable bowel syndrome)   . Hemorrhoids   . Urinary incontinence   . Hematuria, microscopic   . DJD (degenerative joint disease)   . Fibromyalgia   . Anxiety   . Vitamin D deficiency   . RLS (restless legs syndrome)   . Aphasia     transsient aphasia Jan 2012  . Memory loss   . Memory deficits 02/15/2013  . TIA (transient ischemic attack)     Past Surgical History  Procedure Laterality Date  . Abdominal hysterectomy  1995  . Corneal transplant for keratonconus    . Right bunion surgery    . Left foot fracture with orif  07/2004    Dr. Sharol Given  . Eye surgery  2013    There were no vitals filed for this visit.  Visit Diagnosis:  Difficulty walking  Left-sided weakness  Transient cerebral ischemia, unspecified transient cerebral ischemia type      Subjective Assessment - 05/15/15 1358    Subjective Pt reports that she had numbness in her L leg last Monday and went to the ER last Monday. Pt stated that she was told that the numbness could mimic a stroke so don't  ignore it.  Pt stated after all the test it was determined she did not have a mini stroke but does have an impingement in her nec.    Currently in Pain? No/denies   Pain Score 0-No pain                         [facility] Adult PT Treatment/Exercise - 05/15/15 0001    Lumbar Exercises: Standing   Other Standing Lumbar Exercises standing march on airex 2x10   Knee/Hip Exercises: Aerobic   Nustep L 4 6 min   Knee/Hip Exercises: Machines for Strengthening   Cybex Knee Extension 10# 2 sets 15   Cybex Knee Flexion 25# 2 sets 15   Cybex Leg Press #20 2x10; LLE only no weight x10   Knee/Hip Exercises: Standing   Lateral Step Up 1 set;Left;10 reps;Step Height: 6"  #3 ankle, 5 dunbell                  PT Short Term Goals - 04/03/15 1403    PT SHORT TERM GOAL #1   Title Pt will be independent with HEP   Time 1   Period Weeks   Status New           PT  Long Term Goals - 04/26/15 1406    PT LONG TERM GOAL #1   Title Pt will decrease TUG time to 10 seconds to decrease fall risk.   Status Achieved               Plan - 05/15/15 1429    Clinical Impression Statement Pt 15 minutes late for PT treatment. Pt return to PT after a recent hospital stay. Pt reports that she though that she had another TIA but after testing she was diagnoses with cervical impingement. Pt reports that she was tired today but able to complete all exercises.    Pt will benefit from skilled therapeutic intervention in order to improve on the following deficits Decreased balance;Decreased strength;Difficulty walking   Rehab Potential Good   PT Frequency 2x / week   PT Duration 4 weeks   PT Treatment/Interventions ADLs/Self Care Home Management;Electrical Stimulation;Moist Heat;Therapeutic exercise;Therapeutic activities;Gait training;Balance training;Neuromuscular re-education;Patient/family education;Manual techniques;Energy conservation   PT Next Visit Plan functional strengthening         Problem List Patient Active Problem List   Diagnosis Date Noted  . HLD (hyperlipidemia)   . Overactive bladder 05/08/2015  . Aphasia   . Cervical spondylitis ([place])   . Numbness and tingling in left hand 03/21/2015  . TIA (transient ischemic attack) 03/21/2015  . UTI (lower urinary tract infection) 03/21/2015  . Physical exam 10/05/2014  . Abdominal pain 05/17/2014  . Atypical chest pain 05/17/2014  . Allergic rhinitis 05/17/2014  . Fatigue 04/04/2014  . Anxiety and depression 07/25/2013  . Weight loss, unintentional 07/25/2013  . Sinusitis, acute, maxillary 07/13/2013  . Cerumen impaction 07/13/2013  . Memory deficits 02/15/2013  . Lumbar spondylosis 09/21/2012  . Single episode of elevated blood pressure 09/29/2011  . Palpitations 09/16/2010  . Left leg pain 07/04/2010  . OTHER ABNORMALITY OF URINATION 09/25/2009  . DERMATITIS 03/23/2009  . Cerebrovascular disease 04/03/2008  . DYSPHONIA 04/03/2008  . CHEST PAIN, ATYPICAL 04/03/2008  . ASTHMATIC BRONCHITIS, ACUTE 04/02/2008  . DIVERTICULOSIS OF COLON 04/02/2008  . Irritable bowel syndrome 04/02/2008  . DEGENERATIVE JOINT DISEASE 04/02/2008  . HEMATURIA, MICROSCOPIC, HX OF 04/02/2008  . Vitamin D deficiency 03/28/2008  . HYPERCHOLESTEROLEMIA 03/28/2008  . FIBROMYALGIA 03/28/2008  . URINARY INCONTINENCE 03/28/2008  . ACUTE CYSTITIS 03/22/2008  . GERD 08/02/2007  . Gastroparesis 08/02/2007  . HEMORRHOIDS 07/30/2007  . ESOPHAGEAL STRICTURE 07/30/2007    Scot Jun, PTA  05/15/2015, 2:31 PM  [place], [place], [address] Phone: [phone]   Fax:  [phone]  Name: Paige Pena MRN: [id number] Date of Birth: 1941/01/29

## 2015-05-16 ENCOUNTER — Inpatient Hospital Stay: Payer: Medicare Other | Admitting: Family Medicine

## 2015-05-17 ENCOUNTER — Ambulatory Visit: Payer: Medicare Other | Admitting: Physical Therapy

## 2015-05-22 ENCOUNTER — Ambulatory Visit: Payer: Medicare Other | Admitting: Physical Therapy

## 2015-05-22 ENCOUNTER — Encounter: Payer: Self-pay | Admitting: Physical Therapy

## 2015-05-22 DIAGNOSIS — G459 Transient cerebral ischemic attack, unspecified: Secondary | ICD-10-CM

## 2015-05-22 DIAGNOSIS — R262 Difficulty in walking, not elsewhere classified: Secondary | ICD-10-CM | POA: Diagnosis not present

## 2015-05-22 DIAGNOSIS — R531 Weakness: Secondary | ICD-10-CM

## 2015-05-22 NOTE — Therapy (Signed)
[place], [place], [address] Phone: [phone]   Fax:  [phone]  Physical Therapy Treatment  Patient Details  Name: Paige Pena MRN: [id number] Date of Birth: 02-Oct-1940 Referring Provider: [name]  Encounter Date: 05/22/2015      PT End of Session - 05/22/15 1144    Visit Number 8   Date for PT Re-Evaluation 06/01/15   PT Start Time 1110   PT Stop Time 1144   PT Time Calculation (min) 34 min   Activity Tolerance Patient tolerated treatment well   Behavior During Therapy [facility] for tasks assessed/performed      Past Medical History  Diagnosis Date  . Dysphonia   . Asthmatic bronchitis   . Chest pain, atypical   . Cerebrovascular disease   . Hypercholesteremia   . GERD (gastroesophageal reflux disease)   . Acute cystitis   . Esophageal stricture   . Gastroparesis   . Diverticulosis of colon   . IBS (irritable bowel syndrome)   . Hemorrhoids   . Urinary incontinence   . Hematuria, microscopic   . DJD (degenerative joint disease)   . Fibromyalgia   . Anxiety   . Vitamin D deficiency   . RLS (restless legs syndrome)   . Aphasia     transsient aphasia Jan 2012  . Memory loss   . Memory deficits 02/15/2013  . TIA (transient ischemic attack)     Past Surgical History  Procedure Laterality Date  . Abdominal hysterectomy  1995  . Corneal transplant for keratonconus    . Right bunion surgery    . Left foot fracture with orif  07/2004    Dr. Sharol Given  . Eye surgery  2013    There were no vitals filed for this visit.  Visit Diagnosis:  Difficulty walking  Left-sided weakness  Transient cerebral ischemia, unspecified transient cerebral ischemia type      Subjective Assessment - 05/22/15 1111    Subjective Yes Im getting my strength back.   Currently in Pain? No/denies   Pain Score 0-No pain                         [facility] Adult PT Treatment/Exercise -  05/22/15 0001    Ambulation/Gait   Ambulation/Gait Yes   Ambulation/Gait Assistance 7: Independent   Ambulation Distance (Feet) 250 Feet  >   Assistive device None   Gait Pattern Step-through pattern   Ambulation Surface Unlevel;Outdoor;Paved   Gait Comments Outside up and down hill around building    High Level Balance   High Level Balance Activities Tandem walking   Lumbar Exercises: Aerobic   UBE (Upper Arm Bike) L2 x4 minutes    Knee/Hip Exercises: Aerobic   Nustep L 4 6 min   Knee/Hip Exercises: Machines for Strengthening   Cybex Leg Press #30 2x15; LLE only no weight x10   Knee/Hip Exercises: Standing   Forward Step Up Both;10 reps;1 set  #5 dumbbells                   PT Short Term Goals - 04/03/15 1403    PT SHORT TERM GOAL #1   Title Pt will be independent with HEP   Time 1   Period Weeks   Status New           PT Long Term Goals - 05/22/15 1139    PT LONG TERM GOAL #2  Title Pt will ambulate at least 600 feet independently with no deviation and minimal loss of balance.    Status Partially Met   PT LONG TERM GOAL #3   Title Pt will demonstrate ability to tandem walk for at least 15 feet with minimal loss of balance.    Status Partially Met   PT LONG TERM GOAL #4   Title Pt will feel comfortable shopping independently.   Status Achieved               Plan - 05/22/15 1144    Clinical Impression Statement 10 minutes late, reports overall improvement, completed all exercises well continues to have issues with tandem walking.   PT Next Visit Plan functional strengthening        Problem List Patient Active Problem List   Diagnosis Date Noted  . HLD (hyperlipidemia)   . Overactive bladder 05/08/2015  . Aphasia   . Cervical spondylitis ([place])   . Numbness and tingling in left hand 03/21/2015  . TIA (transient ischemic attack) 03/21/2015  . UTI (lower urinary tract infection) 03/21/2015  . Physical exam 10/05/2014  . Abdominal pain  05/17/2014  . Atypical chest pain 05/17/2014  . Allergic rhinitis 05/17/2014  . Fatigue 04/04/2014  . Anxiety and depression 07/25/2013  . Weight loss, unintentional 07/25/2013  . Sinusitis, acute, maxillary 07/13/2013  . Cerumen impaction 07/13/2013  . Memory deficits 02/15/2013  . Lumbar spondylosis 09/21/2012  . Single episode of elevated blood pressure 09/29/2011  . Palpitations 09/16/2010  . Left leg pain 07/04/2010  . OTHER ABNORMALITY OF URINATION 09/25/2009  . DERMATITIS 03/23/2009  . Cerebrovascular disease 04/03/2008  . DYSPHONIA 04/03/2008  . CHEST PAIN, ATYPICAL 04/03/2008  . ASTHMATIC BRONCHITIS, ACUTE 04/02/2008  . DIVERTICULOSIS OF COLON 04/02/2008  . Irritable bowel syndrome 04/02/2008  . DEGENERATIVE JOINT DISEASE 04/02/2008  . HEMATURIA, MICROSCOPIC, HX OF 04/02/2008  . Vitamin D deficiency 03/28/2008  . HYPERCHOLESTEROLEMIA 03/28/2008  . FIBROMYALGIA 03/28/2008  . URINARY INCONTINENCE 03/28/2008  . ACUTE CYSTITIS 03/22/2008  . GERD 08/02/2007  . Gastroparesis 08/02/2007  . HEMORRHOIDS 07/30/2007  . ESOPHAGEAL STRICTURE 07/30/2007    Scot Jun, PTA  05/22/2015, 11:46 AM  [place], [place], [address] Phone: [phone]   Fax:  [phone]  Name: ESRA FRANKOWSKI MRN: [id number] Date of Birth: 05-17-1940

## 2015-05-24 ENCOUNTER — Encounter: Payer: Self-pay | Admitting: Physical Therapy

## 2015-05-24 ENCOUNTER — Ambulatory Visit: Payer: Medicare Other | Admitting: Physical Therapy

## 2015-05-24 DIAGNOSIS — R262 Difficulty in walking, not elsewhere classified: Secondary | ICD-10-CM | POA: Diagnosis not present

## 2015-05-24 DIAGNOSIS — G459 Transient cerebral ischemic attack, unspecified: Secondary | ICD-10-CM

## 2015-05-24 DIAGNOSIS — R531 Weakness: Secondary | ICD-10-CM

## 2015-05-24 NOTE — Therapy (Signed)
[place], [place], [address] Phone: [phone]   Fax:  [phone]  Physical Therapy Treatment  Patient Details  Name: Paige Pena MRN: [id number] Date of Birth: Aug 24, 1940 Referring Provider: [name]  Encounter Date: 05/24/2015      PT End of Session - 05/24/15 1427    Visit Number 9   Date for PT Re-Evaluation 06/01/15   PT Start Time 9833   PT Stop Time 1430   PT Time Calculation (min) 32 min   Activity Tolerance Patient tolerated treatment well   Behavior During Therapy [facility] for tasks assessed/performed      Past Medical History  Diagnosis Date  . Dysphonia   . Asthmatic bronchitis   . Chest pain, atypical   . Cerebrovascular disease   . Hypercholesteremia   . GERD (gastroesophageal reflux disease)   . Acute cystitis   . Esophageal stricture   . Gastroparesis   . Diverticulosis of colon   . IBS (irritable bowel syndrome)   . Hemorrhoids   . Urinary incontinence   . Hematuria, microscopic   . DJD (degenerative joint disease)   . Fibromyalgia   . Anxiety   . Vitamin D deficiency   . RLS (restless legs syndrome)   . Aphasia     transsient aphasia Jan 2012  . Memory loss   . Memory deficits 02/15/2013  . TIA (transient ischemic attack)     Past Surgical History  Procedure Laterality Date  . Abdominal hysterectomy  1995  . Corneal transplant for keratonconus    . Right bunion surgery    . Left foot fracture with orif  07/2004    Dr. Sharol Given  . Eye surgery  2013    There were no vitals filed for this visit.  Visit Diagnosis:  Difficulty walking  Left-sided weakness  Transient cerebral ischemia, unspecified transient cerebral ischemia type      Subjective Assessment - 05/24/15 1405    Subjective "I guess my leg is a little better, I still feel like I veer to the left"   Currently in Pain? No/denies   Pain Score 0-No pain                          [facility] Adult PT Treatment/Exercise - 05/24/15 0001    Ambulation/Gait   Ambulation/Gait Yes   Ambulation/Gait Assistance 7: Independent;6: Modified independent (Device/Increase time)   Ambulation Distance (Feet) 250 Feet  >   Assistive device None   Gait Pattern Step-through pattern   Ambulation Surface Unlevel;Outdoor;Paved   Gait Comments Outside up and down hill around building    Lumbar Exercises: Aerobic   Elliptical I-10 R 1 x43mn    UBE (Upper Arm Bike) L2 x 6 minutes    Knee/Hip Exercises: Machines for Strengthening   Cybex Knee Flexion 35# 2 sets 15   Cybex Leg Press #30 2x15; LLE only #20 2x10                  PT Short Term Goals - 04/03/15 1403    PT SHORT TERM GOAL #1   Title Pt will be independent with HEP   Time 1   Period Weeks   Status New           PT Long Term Goals - 05/22/15 1139    PT LONG TERM GOAL #2   Title Pt will ambulate at least 600 feet independently  with no deviation and minimal loss of balance.    Status Partially Met   PT LONG TERM GOAL #3   Title Pt will demonstrate ability to tandem walk for at least 15 feet with minimal loss of balance.    Status Partially Met   PT LONG TERM GOAL #4   Title Pt will feel comfortable shopping independently.   Status Achieved               Plan - 05/24/15 1427    Clinical Impression Statement 15 minutes late for PT treatment. Pt performs well, but when discharge is mentioned she reports that she has issues with her L leg.  Pt demos good strength and ROM with LLE. No issues with outdoor ambulation on uneven surfaces.    Pt will benefit from skilled therapeutic intervention in order to improve on the following deficits Decreased balance;Decreased strength;Difficulty walking   Rehab Potential Good   PT Frequency 2x / week   PT Duration 4 weeks   PT Treatment/Interventions ADLs/Self Care Home Management   PT Next Visit Plan functional strengthening        Problem List Patient  Active Problem List   Diagnosis Date Noted  . HLD (hyperlipidemia)   . Overactive bladder 05/08/2015  . Aphasia   . Cervical spondylitis ([place])   . Numbness and tingling in left hand 03/21/2015  . TIA (transient ischemic attack) 03/21/2015  . UTI (lower urinary tract infection) 03/21/2015  . Physical exam 10/05/2014  . Abdominal pain 05/17/2014  . Atypical chest pain 05/17/2014  . Allergic rhinitis 05/17/2014  . Fatigue 04/04/2014  . Anxiety and depression 07/25/2013  . Weight loss, unintentional 07/25/2013  . Sinusitis, acute, maxillary 07/13/2013  . Cerumen impaction 07/13/2013  . Memory deficits 02/15/2013  . Lumbar spondylosis 09/21/2012  . Single episode of elevated blood pressure 09/29/2011  . Palpitations 09/16/2010  . Left leg pain 07/04/2010  . OTHER ABNORMALITY OF URINATION 09/25/2009  . DERMATITIS 03/23/2009  . Cerebrovascular disease 04/03/2008  . DYSPHONIA 04/03/2008  . CHEST PAIN, ATYPICAL 04/03/2008  . ASTHMATIC BRONCHITIS, ACUTE 04/02/2008  . DIVERTICULOSIS OF COLON 04/02/2008  . Irritable bowel syndrome 04/02/2008  . DEGENERATIVE JOINT DISEASE 04/02/2008  . HEMATURIA, MICROSCOPIC, HX OF 04/02/2008  . Vitamin D deficiency 03/28/2008  . HYPERCHOLESTEROLEMIA 03/28/2008  . FIBROMYALGIA 03/28/2008  . URINARY INCONTINENCE 03/28/2008  . ACUTE CYSTITIS 03/22/2008  . GERD 08/02/2007  . Gastroparesis 08/02/2007  . HEMORRHOIDS 07/30/2007  . ESOPHAGEAL STRICTURE 07/30/2007    Scot Jun, PTA  05/24/2015, 2:30 PM  [place], [place], [address] Phone: [phone]   Fax:  [phone]  Name: Paige Pena MRN: [id number] Date of Birth: [birth year]

## 2015-05-29 ENCOUNTER — Encounter: Payer: Self-pay | Admitting: Physical Therapy

## 2015-05-29 ENCOUNTER — Ambulatory Visit: Payer: Medicare Other | Attending: Family Medicine | Admitting: Physical Therapy

## 2015-05-29 DIAGNOSIS — M6289 Other specified disorders of muscle: Secondary | ICD-10-CM | POA: Insufficient documentation

## 2015-05-29 DIAGNOSIS — M6281 Muscle weakness (generalized): Secondary | ICD-10-CM

## 2015-05-29 DIAGNOSIS — G459 Transient cerebral ischemic attack, unspecified: Secondary | ICD-10-CM | POA: Insufficient documentation

## 2015-05-29 DIAGNOSIS — R262 Difficulty in walking, not elsewhere classified: Secondary | ICD-10-CM | POA: Insufficient documentation

## 2015-05-29 NOTE — Therapy (Signed)
[place], [place], [address] Phone: [phone]   Fax:  [phone]  Physical Therapy Treatment  Patient Details  Name: Paige Pena MRN: [id number] Date of Birth: Dec 11, 1940 Referring Provider: [name]  Encounter Date: 05/29/2015      PT End of Session - 05/29/15 1555    Visit Number 10   Date for PT Re-Evaluation 06/01/15   PT Start Time 1521   PT Stop Time 1559   PT Time Calculation (min) 38 min   Activity Tolerance Patient tolerated treatment well   Behavior During Therapy [facility] for tasks assessed/performed      Past Medical History  Diagnosis Date  . Dysphonia   . Asthmatic bronchitis   . Chest pain, atypical   . Cerebrovascular disease   . Hypercholesteremia   . GERD (gastroesophageal reflux disease)   . Acute cystitis   . Esophageal stricture   . Gastroparesis   . Diverticulosis of colon   . IBS (irritable bowel syndrome)   . Hemorrhoids   . Urinary incontinence   . Hematuria, microscopic   . DJD (degenerative joint disease)   . Fibromyalgia   . Anxiety   . Vitamin D deficiency   . RLS (restless legs syndrome)   . Aphasia     transsient aphasia Jan 2012  . Memory loss   . Memory deficits 02/15/2013  . TIA (transient ischemic attack)     Past Surgical History  Procedure Laterality Date  . Abdominal hysterectomy  1995  . Corneal transplant for keratonconus    . Right bunion surgery    . Left foot fracture with orif  07/2004    Dr. Sharol Given  . Eye surgery  2013    There were no vitals filed for this visit.  Visit Diagnosis:  Difficulty walking  Left-sided weakness  Transient cerebral ischemia, unspecified transient cerebral ischemia type      Subjective Assessment - 05/29/15 1526    Subjective "Its doing pretty good, I stumbled walking down the hall."   Currently in Pain? No/denies   Pain Score 0-No pain            [facility] PT Assessment - 05/29/15 0001     Strength   Overall Strength Comments bilateral shoulder flexion 5/5, left elbow flexion 5/5, right elbow flexion 5/5, left elbow extension 5/5, right elbow extension 5/5, left hip flexion 4+/5, right hip flexion 5/5, bilateral knee extension 5/5, bilateral ankle DF 4+/5                     [facility] Adult PT Treatment/Exercise - 05/29/15 0001    High Level Balance   High Level Balance Activities Tandem walking   Knee/Hip Exercises: Aerobic   Nustep L 5 6 min   Knee/Hip Exercises: Machines for Strengthening   Cybex Knee Extension 10# 2 sets 15, #5 2x10 LLE only    Cybex Knee Flexion 25# 2 sets 15, LLE only #10 2x10    Cybex Leg Press #30 2x15; LLE only #20 2x10                  PT Short Term Goals - 04/03/15 1403    PT SHORT TERM GOAL #1   Title Pt will be independent with HEP   Time 1   Period Weeks   Status New           PT Long Term Goals - 05/29/15 1556  PT LONG TERM GOAL #1   Title Pt will decrease TUG time to 10 seconds to decrease fall risk.   PT LONG TERM GOAL #2   Title Pt will ambulate at least 600 feet independently with no deviation and minimal loss of balance.    Status Achieved   PT LONG TERM GOAL #3   Title Pt will demonstrate ability to tandem walk for at least 15 feet with minimal loss of balance.    Status Achieved   PT LONG TERM GOAL #4   Title Pt will feel comfortable shopping independently.   Status Achieved               Plan - 05/29/15 1557    Clinical Impression Statement 6 minutes last, pt performs well in PT but continues to reports om issues with L side. Pt reports that she stumbles sometimes and don't know why. Pt has reached all PT stated goals's.    Pt will benefit from skilled therapeutic intervention in order to improve on the following deficits Decreased balance;Decreased strength;Difficulty walking   Rehab Potential Good   PT Frequency 2x / week   PT Treatment/Interventions ADLs/Self Care Home Management    PT Next Visit Plan D/C PT         Problem List Patient Active Problem List   Diagnosis Date Noted  . HLD (hyperlipidemia)   . Overactive bladder 05/08/2015  . Aphasia   . Cervical spondylitis ([place])   . Numbness and tingling in left hand 03/21/2015  . TIA (transient ischemic attack) 03/21/2015  . UTI (lower urinary tract infection) 03/21/2015  . Physical exam 10/05/2014  . Abdominal pain 05/17/2014  . Atypical chest pain 05/17/2014  . Allergic rhinitis 05/17/2014  . Fatigue 04/04/2014  . Anxiety and depression 07/25/2013  . Weight loss, unintentional 07/25/2013  . Sinusitis, acute, maxillary 07/13/2013  . Cerumen impaction 07/13/2013  . Memory deficits 02/15/2013  . Lumbar spondylosis 09/21/2012  . Single episode of elevated blood pressure 09/29/2011  . Palpitations 09/16/2010  . Left leg pain 07/04/2010  . OTHER ABNORMALITY OF URINATION 09/25/2009  . DERMATITIS 03/23/2009  . Cerebrovascular disease 04/03/2008  . DYSPHONIA 04/03/2008  . CHEST PAIN, ATYPICAL 04/03/2008  . ASTHMATIC BRONCHITIS, ACUTE 04/02/2008  . DIVERTICULOSIS OF COLON 04/02/2008  . Irritable bowel syndrome 04/02/2008  . DEGENERATIVE JOINT DISEASE 04/02/2008  . HEMATURIA, MICROSCOPIC, HX OF 04/02/2008  . Vitamin D deficiency 03/28/2008  . HYPERCHOLESTEROLEMIA 03/28/2008  . FIBROMYALGIA 03/28/2008  . URINARY INCONTINENCE 03/28/2008  . ACUTE CYSTITIS 03/22/2008  . GERD 08/02/2007  . Gastroparesis 08/02/2007  . HEMORRHOIDS 07/30/2007  . ESOPHAGEAL STRICTURE 07/30/2007    Scot Jun, PTA 05/29/2015, 3:59 PM  [place], [place], [address] Phone: [phone]   Fax:  [phone]  Name: CHANNON BATCHO MRN: [id number] Date of Birth: 1940/11/10

## 2015-05-31 ENCOUNTER — Telehealth: Payer: Self-pay | Admitting: Family Medicine

## 2015-05-31 ENCOUNTER — Encounter: Payer: Self-pay | Admitting: Physical Therapy

## 2015-05-31 ENCOUNTER — Ambulatory Visit: Payer: Medicare Other | Admitting: Physical Therapy

## 2015-05-31 DIAGNOSIS — R262 Difficulty in walking, not elsewhere classified: Secondary | ICD-10-CM

## 2015-05-31 DIAGNOSIS — G459 Transient cerebral ischemic attack, unspecified: Secondary | ICD-10-CM

## 2015-05-31 DIAGNOSIS — M6281 Muscle weakness (generalized): Secondary | ICD-10-CM

## 2015-05-31 NOTE — Telephone Encounter (Signed)
ok to extend PT?

## 2015-05-31 NOTE — Telephone Encounter (Signed)
Caller name:Sylvester Relationship to patient: Can be reached:[phone] Pharmacy:  Reason for call:Her last physical therapy appointment ffrom her January hospital stay was today.  She needs a new order for physical therapy at [place]  For balance and to strengthen her left side.

## 2015-05-31 NOTE — Therapy (Signed)
[place], [place], [address] Phone: [phone]   Fax:  [phone]  Physical Therapy Treatment  Patient Details  Name: Paige Pena MRN: [id number] Date of Birth: [birth year] Referring Provider: [name]  Encounter Date: 05/31/2015      PT End of Session - 05/31/15 1600    Visit Number 11   Date for PT Re-Evaluation 06/01/15   PT Start Time 1521   PT Stop Time 1600   PT Time Calculation (min) 39 min   Activity Tolerance Patient tolerated treatment well   Behavior During Therapy [facility] for tasks assessed/performed      Past Medical History  Diagnosis Date  . Dysphonia   . Asthmatic bronchitis   . Chest pain, atypical   . Cerebrovascular disease   . Hypercholesteremia   . GERD (gastroesophageal reflux disease)   . Acute cystitis   . Esophageal stricture   . Gastroparesis   . Diverticulosis of colon   . IBS (irritable bowel syndrome)   . Hemorrhoids   . Urinary incontinence   . Hematuria, microscopic   . DJD (degenerative joint disease)   . Fibromyalgia   . Anxiety   . Vitamin D deficiency   . RLS (restless legs syndrome)   . Aphasia     transsient aphasia Jan 2012  . Memory loss   . Memory deficits 02/15/2013  . TIA (transient ischemic attack)     Past Surgical History  Procedure Laterality Date  . Abdominal hysterectomy  1995  . Corneal transplant for keratonconus    . Right bunion surgery    . Left foot fracture with orif  07/2004    Dr. Sharol Given  . Eye surgery  2013    There were no vitals filed for this visit.  Visit Diagnosis:  Difficulty walking  Muscle weakness (generalized)      Subjective Assessment - 05/31/15 1607    Subjective "Im getting better"   Currently in Pain? No/denies   Pain Score 0-No pain                         [facility] Adult PT Treatment/Exercise - 05/31/15 0001    Ambulation/Gait   Stairs Yes   Stairs Assistance 6: Modified  independent (Device/Increase time);7: Independent   Stair Management Technique No rails;One rail Right;One rail Left;Alternating pattern   Number of Stairs 24   Height of Stairs 6   Gait Comments x3 no LOB   Knee/Hip Exercises: Aerobic   Nustep L 5 6 min   Knee/Hip Exercises: Machines for Strengthening   Cybex Knee Extension 10# 2 sets 15, #5 x10 LLE only    Cybex Knee Flexion 25# 2 sets 15, LLE only #10 x10    Cybex Leg Press #40 2x15; LLE only #20 x10                  PT Short Term Goals - 04/03/15 1403    PT SHORT TERM GOAL #1   Title Pt will be independent with HEP   Time 1   Period Weeks   Status New           PT Long Term Goals - 05/29/15 1556    PT LONG TERM GOAL #1   Title Pt will decrease TUG time to 10 seconds to decrease fall risk.   PT LONG TERM GOAL #2   Title Pt will ambulate at least 600 feet  independently with no deviation and minimal loss of balance.    Status Achieved   PT LONG TERM GOAL #3   Title Pt will demonstrate ability to tandem walk for at least 15 feet with minimal loss of balance.    Status Achieved   PT LONG TERM GOAL #4   Title Pt will feel comfortable shopping independently.   Status Achieved               Plan - 05/31/15 1603    Clinical Impression Statement Again 6 minutes late. Pt continues to perform well in therapy, Independent with stair negotiation without rail and outdoor ambulation. Pt has completed all goals but reports that she does not want to stop therapy because her  L led does not feel right. Reports no fall just occasional stumbles. Pt stated that she fell comfortable when she  goes out because she is always with her husband.   Pt will benefit from skilled therapeutic intervention in order to improve on the following deficits Decreased balance;Decreased strength;Difficulty walking   Rehab Potential Good   PT Frequency 2x / week   PT Duration 4 weeks   PT Treatment/Interventions ADLs/Self Care Home  Management   PT Next Visit Plan Check goals.        Problem List Patient Active Problem List   Diagnosis Date Noted  . HLD (hyperlipidemia)   . Overactive bladder 05/08/2015  . Aphasia   . Cervical spondylitis ([place])   . Numbness and tingling in left hand 03/21/2015  . TIA (transient ischemic attack) 03/21/2015  . UTI (lower urinary tract infection) 03/21/2015  . Physical exam 10/05/2014  . Abdominal pain 05/17/2014  . Atypical chest pain 05/17/2014  . Allergic rhinitis 05/17/2014  . Fatigue 04/04/2014  . Anxiety and depression 07/25/2013  . Weight loss, unintentional 07/25/2013  . Sinusitis, acute, maxillary 07/13/2013  . Cerumen impaction 07/13/2013  . Memory deficits 02/15/2013  . Lumbar spondylosis 09/21/2012  . Single episode of elevated blood pressure 09/29/2011  . Palpitations 09/16/2010  . Left leg pain 07/04/2010  . OTHER ABNORMALITY OF URINATION 09/25/2009  . DERMATITIS 03/23/2009  . Cerebrovascular disease 04/03/2008  . DYSPHONIA 04/03/2008  . CHEST PAIN, ATYPICAL 04/03/2008  . ASTHMATIC BRONCHITIS, ACUTE 04/02/2008  . DIVERTICULOSIS OF COLON 04/02/2008  . Irritable bowel syndrome 04/02/2008  . DEGENERATIVE JOINT DISEASE 04/02/2008  . HEMATURIA, MICROSCOPIC, HX OF 04/02/2008  . Vitamin D deficiency 03/28/2008  . HYPERCHOLESTEROLEMIA 03/28/2008  . FIBROMYALGIA 03/28/2008  . URINARY INCONTINENCE 03/28/2008  . ACUTE CYSTITIS 03/22/2008  . GERD 08/02/2007  . Gastroparesis 08/02/2007  . HEMORRHOIDS 07/30/2007  . ESOPHAGEAL STRICTURE 07/30/2007    Scot Jun, PTA  05/31/2015, 4:07 PM  [place], [place], [address] Phone: [phone]   Fax:  [phone]  Name: Paige Pena MRN: [id number] Date of Birth: [birth year]

## 2015-05-31 NOTE — Telephone Encounter (Signed)
Referral placed.

## 2015-05-31 NOTE — Telephone Encounter (Signed)
Ok to extend order

## 2015-06-04 ENCOUNTER — Ambulatory Visit: Payer: Medicare Other | Admitting: Physical Therapy

## 2015-06-04 ENCOUNTER — Encounter: Payer: Self-pay | Admitting: Physical Therapy

## 2015-06-04 DIAGNOSIS — R262 Difficulty in walking, not elsewhere classified: Secondary | ICD-10-CM

## 2015-06-04 DIAGNOSIS — M6281 Muscle weakness (generalized): Secondary | ICD-10-CM

## 2015-06-04 NOTE — Therapy (Unsigned)
[place], [place], [address] Phone: [phone]   Fax:  [phone]  Physical Therapy Treatment  Patient Details  Name: Paige Pena MRN: [id number] Date of Birth: October 23, 1940 Referring Provider: [name]  Encounter Date: 06/04/2015      PT End of Session - 06/04/15 1442    Visit Number 12   Date for PT Re-Evaluation 06/01/15   PT Start Time 1400   PT Stop Time 1440   PT Time Calculation (min) 40 min   Activity Tolerance Patient tolerated treatment well   Behavior During Therapy [facility] for tasks assessed/performed      Past Medical History  Diagnosis Date  . Dysphonia   . Asthmatic bronchitis   . Chest pain, atypical   . Cerebrovascular disease   . Hypercholesteremia   . GERD (gastroesophageal reflux disease)   . Acute cystitis   . Esophageal stricture   . Gastroparesis   . Diverticulosis of colon   . IBS (irritable bowel syndrome)   . Hemorrhoids   . Urinary incontinence   . Hematuria, microscopic   . DJD (degenerative joint disease)   . Fibromyalgia   . Anxiety   . Vitamin D deficiency   . RLS (restless legs syndrome)   . Aphasia     transsient aphasia Jan 2012  . Memory loss   . Memory deficits 02/15/2013  . TIA (transient ischemic attack)     Past Surgical History  Procedure Laterality Date  . Abdominal hysterectomy  1995  . Corneal transplant for keratonconus    . Right bunion surgery    . Left foot fracture with orif  07/2004    Dr. Sharol Given  . Eye surgery  2013    There were no vitals filed for this visit.      Subjective Assessment - 06/04/15 1400    Subjective "I'm feeling alright."   Currently in Pain? No/denies   Pain Score 0-No pain                         [facility] Adult PT Treatment/Exercise - 06/04/15 0001    Ambulation/Gait   Ambulation/Gait Yes   Ambulation/Gait Assistance 7: Independent;6: Modified independent (Device/Increase time)   Ambulation Distance (Feet) 250 Feet   Assistive device None   Gait Pattern Step-through pattern   Ambulation Surface Unlevel;Outdoor;Paved   Gait Comments Outside up and downhill around building, several occurences of left toe dragging on the ground during swing phase   Knee/Hip Exercises: Aerobic   Nustep L 5 6 min   Knee/Hip Exercises: Machines for Strengthening   Cybex Knee Extension 10# 2 sets 15, #5 x10 LLE only    Cybex Knee Flexion 25# 2 sets 15, LLE only #10 x10    Cybex Leg Press #40 2x15; LLE only #20 x10   Knee/Hip Exercises: Standing   Forward Step Up Both;20 reps                  PT Short Term Goals - 04/03/15 1403    PT SHORT TERM GOAL #1   Title Pt will be independent with HEP   Time 1   Period Weeks   Status New           PT Long Term Goals - 05/29/15 1556    PT LONG TERM GOAL #1   Title Pt will decrease TUG time to 10 seconds to decrease fall risk.  PT LONG TERM GOAL #2   Title Pt will ambulate at least 600 feet independently with no deviation and minimal loss of balance.    Status Achieved   PT LONG TERM GOAL #3   Title Pt will demonstrate ability to tandem walk for at least 15 feet with minimal loss of balance.    Status Achieved   PT LONG TERM GOAL #4   Title Pt will feel comfortable shopping independently.   Status Achieved               Plan - 06/04/15 1443    Clinical Impression Statement Patient was 13 minutes late. Pt fatigued during ther ex and gait today. Independent with outdoor gait with several occasions where her left toe dragged on the ground during swing phase, possibly due to left dorsiflexor weakness or late effects of TIA.   PT Treatment/Interventions ADLs/Self Care Home Management   PT Next Visit Plan Progress ther ex within patient's tolerance. Address possible left dorsiflexor weakness or late effects of TIA that is causing left toe drag during left foot swing phase of gait.      Patient will benefit from  skilled therapeutic intervention in order to improve the following deficits and impairments:     Visit Diagnosis: Difficulty walking  Muscle weakness (generalized)     Problem List Patient Active Problem List   Diagnosis Date Noted  . HLD (hyperlipidemia)   . Overactive bladder 05/08/2015  . Aphasia   . Cervical spondylitis ([place])   . Numbness and tingling in left hand 03/21/2015  . TIA (transient ischemic attack) 03/21/2015  . UTI (lower urinary tract infection) 03/21/2015  . Physical exam 10/05/2014  . Abdominal pain 05/17/2014  . Atypical chest pain 05/17/2014  . Allergic rhinitis 05/17/2014  . Fatigue 04/04/2014  . Anxiety and depression 07/25/2013  . Weight loss, unintentional 07/25/2013  . Sinusitis, acute, maxillary 07/13/2013  . Cerumen impaction 07/13/2013  . Memory deficits 02/15/2013  . Lumbar spondylosis 09/21/2012  . Single episode of elevated blood pressure 09/29/2011  . Palpitations 09/16/2010  . Left leg pain 07/04/2010  . OTHER ABNORMALITY OF URINATION 09/25/2009  . DERMATITIS 03/23/2009  . Cerebrovascular disease 04/03/2008  . DYSPHONIA 04/03/2008  . CHEST PAIN, ATYPICAL 04/03/2008  . ASTHMATIC BRONCHITIS, ACUTE 04/02/2008  . DIVERTICULOSIS OF COLON 04/02/2008  . Irritable bowel syndrome 04/02/2008  . DEGENERATIVE JOINT DISEASE 04/02/2008  . HEMATURIA, MICROSCOPIC, HX OF 04/02/2008  . Vitamin D deficiency 03/28/2008  . HYPERCHOLESTEROLEMIA 03/28/2008  . FIBROMYALGIA 03/28/2008  . URINARY INCONTINENCE 03/28/2008  . ACUTE CYSTITIS 03/22/2008  . GERD 08/02/2007  . Gastroparesis 08/02/2007  . HEMORRHOIDS 07/30/2007  . ESOPHAGEAL STRICTURE 07/30/2007    Naaman Plummer SPTA 06/04/2015, 3:22 PM  [place], [place], [address] Phone: [phone]   Fax:  [phone]  Name: HELEENA LEHMKUHL MRN: [id number] Date of Birth: Nov 16, 1940

## 2015-06-06 NOTE — Addendum Note (Signed)
Addended by: Sumner Boast on: 06/06/2015 11:40 AM   Modules accepted: Orders

## 2015-06-07 ENCOUNTER — Encounter: Payer: Medicare Other | Admitting: Neurology

## 2015-06-07 ENCOUNTER — Ambulatory Visit: Payer: Medicare Other | Admitting: Physical Therapy

## 2015-06-08 ENCOUNTER — Encounter: Payer: Self-pay | Admitting: Neurology

## 2015-06-12 ENCOUNTER — Encounter: Payer: Self-pay | Admitting: Physical Therapy

## 2015-06-12 ENCOUNTER — Ambulatory Visit: Payer: Medicare Other | Admitting: Physical Therapy

## 2015-06-12 DIAGNOSIS — R262 Difficulty in walking, not elsewhere classified: Secondary | ICD-10-CM

## 2015-06-12 DIAGNOSIS — M6281 Muscle weakness (generalized): Secondary | ICD-10-CM

## 2015-06-12 NOTE — Therapy (Addendum)
[place], [place], [address] Phone: [phone]   Fax:  [phone]  Physical Therapy Treatment  Patient Details  Name: Paige Pena MRN: [id number] Date of Birth: 05/07/1940 Referring Provider: [name]  Encounter Date: 06/12/2015      PT End of Session - 06/12/15 1426    Visit Number 13   Date for PT Re-Evaluation 06/01/15   PT Start Time [id number]   PT Stop Time 1430   PT Time Calculation (min) 34 min      Past Medical History  Diagnosis Date  . Dysphonia   . Asthmatic bronchitis   . Chest pain, atypical   . Cerebrovascular disease   . Hypercholesteremia   . GERD (gastroesophageal reflux disease)   . Acute cystitis   . Esophageal stricture   . Gastroparesis   . Diverticulosis of colon   . IBS (irritable bowel syndrome)   . Hemorrhoids   . Urinary incontinence   . Hematuria, microscopic   . DJD (degenerative joint disease)   . Fibromyalgia   . Anxiety   . Vitamin D deficiency   . RLS (restless legs syndrome)   . Aphasia     transsient aphasia Jan 2012  . Memory loss   . Memory deficits 02/15/2013  . TIA (transient ischemic attack)     Past Surgical History  Procedure Laterality Date  . Abdominal hysterectomy  1995  . Corneal transplant for keratonconus    . Right bunion surgery    . Left foot fracture with orif  07/2004    Dr. Sharol Given  . Eye surgery  2013    There were no vitals filed for this visit.      Subjective Assessment - 06/12/15 1353    Subjective "A little weird" Its like I find myself walking into the door jam. No falls  just a few stumbles   Currently in Pain? No/denies   Pain Score 0-No pain                         [facility] Adult PT Treatment/Exercise - 06/12/15 0001    High Level Balance   High Level Balance Activities Tandem walking;Negotiating over obstacles   High Level Balance Comments Single leg stance ~20 on RLE ~11 on LLE; standing  march on airex; ball toss on airex x15; standing ball kicks; Standing on BOSU ball toss    Knee/Hip Exercises: Aerobic   Nustep L 5 7 min   Knee/Hip Exercises: Machines for Strengthening   Cybex Leg Press #30 2x15                   PT Short Term Goals - 04/03/15 1403    PT SHORT TERM GOAL #1   Title Pt will be independent with HEP   Time 1   Period Weeks   Status New           PT Long Term Goals - 06/12/15 1704    PT LONG TERM GOAL #5   Title Patient will be able to maintain single leg stance 20 seconds on each foot   Time 4   Period Weeks   Status New   Additional Long Term Goals   Additional Long Term Goals Yes   PT LONG TERM GOAL #6   Title Patient will return to a gym setting and be safe   Time 4   Period Weeks   Status  New               Plan - 06/12/15 1427    Clinical Impression Statement Pt 6 minutes late for today's treatment. Pt has completed all PT goals but still reports that her LLE does not feel right. Pt reports walking into door jams at time. Performed all balancing interventions this date. Most trouble with BOSU standing with ball toss. Unable  to perform SLS on  LLE and maintain for 30 sec.    Rehab Potential Good   PT Frequency 2x / week   PT Duration 4 weeks   PT Treatment/Interventions ADLs/Self Care Home Management   PT Next Visit Plan Progress ther ex within patient's tolerance. Address possible left dorsiflexor weakness or late effects of TIA that is causing left toe drag during left foot swing phase of gait.      Patient will benefit from skilled therapeutic intervention in order to improve the following deficits and impairments:  Decreased balance, Decreased strength, Difficulty walking  Visit Diagnosis: Difficulty walking  Muscle weakness (generalized)     Problem List Patient Active Problem List   Diagnosis Date Noted  . HLD (hyperlipidemia)   . Overactive bladder 05/08/2015  . Aphasia   . Cervical spondylitis  ([place])   . Numbness and tingling in left hand 03/21/2015  . TIA (transient ischemic attack) 03/21/2015  . UTI (lower urinary tract infection) 03/21/2015  . Physical exam 10/05/2014  . Abdominal pain 05/17/2014  . Atypical chest pain 05/17/2014  . Allergic rhinitis 05/17/2014  . Fatigue 04/04/2014  . Anxiety and depression 07/25/2013  . Weight loss, unintentional 07/25/2013  . Sinusitis, acute, maxillary 07/13/2013  . Cerumen impaction 07/13/2013  . Memory deficits 02/15/2013  . Lumbar spondylosis 09/21/2012  . Single episode of elevated blood pressure 09/29/2011  . Palpitations 09/16/2010  . Left leg pain 07/04/2010  . OTHER ABNORMALITY OF URINATION 09/25/2009  . DERMATITIS 03/23/2009  . Cerebrovascular disease 04/03/2008  . DYSPHONIA 04/03/2008  . CHEST PAIN, ATYPICAL 04/03/2008  . ASTHMATIC BRONCHITIS, ACUTE 04/02/2008  . DIVERTICULOSIS OF COLON 04/02/2008  . Irritable bowel syndrome 04/02/2008  . DEGENERATIVE JOINT DISEASE 04/02/2008  . HEMATURIA, MICROSCOPIC, HX OF 04/02/2008  . Vitamin D deficiency 03/28/2008  . HYPERCHOLESTEROLEMIA 03/28/2008  . FIBROMYALGIA 03/28/2008  . URINARY INCONTINENCE 03/28/2008  . ACUTE CYSTITIS 03/22/2008  . GERD 08/02/2007  . Gastroparesis 08/02/2007  . HEMORRHOIDS 07/30/2007  . ESOPHAGEAL STRICTURE 07/30/2007    Sumner Boast, PTA  06/12/2015, 5:06 PM  [place], [place], [address] Phone: [phone]   Fax:  [phone]  Name: Paige Pena MRN: [id number] Date of Birth: 06/04/1940

## 2015-06-14 ENCOUNTER — Ambulatory Visit: Payer: Medicare Other | Admitting: Physical Therapy

## 2015-06-14 ENCOUNTER — Encounter: Payer: Self-pay | Admitting: Physical Therapy

## 2015-06-14 DIAGNOSIS — R262 Difficulty in walking, not elsewhere classified: Secondary | ICD-10-CM

## 2015-06-14 DIAGNOSIS — M6281 Muscle weakness (generalized): Secondary | ICD-10-CM

## 2015-06-14 NOTE — Therapy (Signed)
[place], [place], [address] Phone: [phone]   Fax:  [phone]  Physical Therapy Treatment  Patient Details  Name: Paige Pena MRN: [id number] Date of Birth: 08-18-1940 Referring Provider: [name]  Encounter Date: 06/14/2015      PT End of Session - 06/14/15 1427    Visit Number 14   Date for PT Re-Evaluation 06/01/15   PT Start Time [id number]   PT Stop Time 1428   PT Time Calculation (min) 30 min   Activity Tolerance Patient tolerated treatment well   Behavior During Therapy [facility] for tasks assessed/performed      Past Medical History  Diagnosis Date  . Dysphonia   . Asthmatic bronchitis   . Chest pain, atypical   . Cerebrovascular disease   . Hypercholesteremia   . GERD (gastroesophageal reflux disease)   . Acute cystitis   . Esophageal stricture   . Gastroparesis   . Diverticulosis of colon   . IBS (irritable bowel syndrome)   . Hemorrhoids   . Urinary incontinence   . Hematuria, microscopic   . DJD (degenerative joint disease)   . Fibromyalgia   . Anxiety   . Vitamin D deficiency   . RLS (restless legs syndrome)   . Aphasia     transsient aphasia Jan 2012  . Memory loss   . Memory deficits 02/15/2013  . TIA (transient ischemic attack)     Past Surgical History  Procedure Laterality Date  . Abdominal hysterectomy  1995  . Corneal transplant for keratonconus    . Right bunion surgery    . Left foot fracture with orif  07/2004    Dr. Sharol Given  . Eye surgery  2013    There were no vitals filed for this visit.      Subjective Assessment - 06/14/15 1400    Subjective "My energy level is better, Im debating on what my legs is doing"   Currently in Pain? No/denies   Pain Score 0-No pain                         [facility] Adult PT Treatment/Exercise - 06/14/15 0001    High Level Balance   High Level Balance Activities Negotiating over obstacles  Over  obstacles frd, side, backwards    High Level Balance Comments Step up and over on BOSU x5; Lateral step up and over on BOSU x5; step up on 6 in box from airex x5 each                  PT Short Term Goals - 04/03/15 1403    PT SHORT TERM GOAL #1   Title Pt will be independent with HEP   Time 1   Period Weeks   Status New           PT Long Term Goals - 06/12/15 1704    PT LONG TERM GOAL #5   Title Patient will be able to maintain single leg stance 20 seconds on each foot   Time 4   Period Weeks   Status New   Additional Long Term Goals   Additional Long Term Goals Yes   PT LONG TERM GOAL #6   Title Patient will return to a gym setting and be safe   Time 4   Period Weeks   Status New  Plan - 06/14/15 1427    Clinical Impression Statement Pt 13 minutes late for today's treatment. Pt very challenged with today's interventions more so with interventions involving  BOSU requiring CGA to maintain balance.   Rehab Potential Good   PT Frequency 2x / week   PT Duration 4 weeks   PT Treatment/Interventions ADLs/Self Care Home Management   PT Next Visit Plan balancew interventions      Patient will benefit from skilled therapeutic intervention in order to improve the following deficits and impairments:  Decreased balance, Decreased strength, Difficulty walking  Visit Diagnosis: Difficulty walking  Muscle weakness (generalized)     Problem List Patient Active Problem List   Diagnosis Date Noted  . HLD (hyperlipidemia)   . Overactive bladder 05/08/2015  . Aphasia   . Cervical spondylitis ([place])   . Numbness and tingling in left hand 03/21/2015  . TIA (transient ischemic attack) 03/21/2015  . UTI (lower urinary tract infection) 03/21/2015  . Physical exam 10/05/2014  . Abdominal pain 05/17/2014  . Atypical chest pain 05/17/2014  . Allergic rhinitis 05/17/2014  . Fatigue 04/04/2014  . Anxiety and depression 07/25/2013  . Weight loss,  unintentional 07/25/2013  . Sinusitis, acute, maxillary 07/13/2013  . Cerumen impaction 07/13/2013  . Memory deficits 02/15/2013  . Lumbar spondylosis 09/21/2012  . Single episode of elevated blood pressure 09/29/2011  . Palpitations 09/16/2010  . Left leg pain 07/04/2010  . OTHER ABNORMALITY OF URINATION 09/25/2009  . DERMATITIS 03/23/2009  . Cerebrovascular disease 04/03/2008  . DYSPHONIA 04/03/2008  . CHEST PAIN, ATYPICAL 04/03/2008  . ASTHMATIC BRONCHITIS, ACUTE 04/02/2008  . DIVERTICULOSIS OF COLON 04/02/2008  . Irritable bowel syndrome 04/02/2008  . DEGENERATIVE JOINT DISEASE 04/02/2008  . HEMATURIA, MICROSCOPIC, HX OF 04/02/2008  . Vitamin D deficiency 03/28/2008  . HYPERCHOLESTEROLEMIA 03/28/2008  . FIBROMYALGIA 03/28/2008  . URINARY INCONTINENCE 03/28/2008  . ACUTE CYSTITIS 03/22/2008  . GERD 08/02/2007  . Gastroparesis 08/02/2007  . HEMORRHOIDS 07/30/2007  . ESOPHAGEAL STRICTURE 07/30/2007    Scot Jun, PTA  06/14/2015, 2:30 PM  [place], [place], [address] Phone: [phone]   Fax:  [phone]  Name: Paige Pena MRN: [id number] Date of Birth: 09-Jul-1940

## 2015-06-19 ENCOUNTER — Encounter: Payer: Self-pay | Admitting: Physical Therapy

## 2015-06-19 ENCOUNTER — Ambulatory Visit: Payer: Medicare Other | Admitting: Physical Therapy

## 2015-06-19 DIAGNOSIS — R262 Difficulty in walking, not elsewhere classified: Secondary | ICD-10-CM | POA: Diagnosis not present

## 2015-06-19 DIAGNOSIS — M6281 Muscle weakness (generalized): Secondary | ICD-10-CM

## 2015-06-19 NOTE — Therapy (Signed)
[place], [place], [address] Phone: [phone]   Fax:  [phone]  Physical Therapy Treatment  Patient Details  Name: Paige Pena MRN: [id number] Date of Birth: 1941-02-17 Referring Provider: [name]  Encounter Date: 06/19/2015      PT End of Session - 06/19/15 1513    Visit Number 15   Date for PT Re-Evaluation 06/01/15   PT Start Time [id number]   PT Stop Time 1514   PT Time Calculation (min) 35 min   Activity Tolerance Patient tolerated treatment well   Behavior During Therapy [facility] for tasks assessed/performed      Past Medical History  Diagnosis Date  . Dysphonia   . Asthmatic bronchitis   . Chest pain, atypical   . Cerebrovascular disease   . Hypercholesteremia   . GERD (gastroesophageal reflux disease)   . Acute cystitis   . Esophageal stricture   . Gastroparesis   . Diverticulosis of colon   . IBS (irritable bowel syndrome)   . Hemorrhoids   . Urinary incontinence   . Hematuria, microscopic   . DJD (degenerative joint disease)   . Fibromyalgia   . Anxiety   . Vitamin D deficiency   . RLS (restless legs syndrome)   . Aphasia     transsient aphasia Jan 2012  . Memory loss   . Memory deficits 02/15/2013  . TIA (transient ischemic attack)     Past Surgical History  Procedure Laterality Date  . Abdominal hysterectomy  1995  . Corneal transplant for keratonconus    . Right bunion surgery    . Left foot fracture with orif  07/2004    Dr. Sharol Given  . Eye surgery  2013    There were no vitals filed for this visit.      Subjective Assessment - 06/19/15 1440    Subjective "A little ache in my knee, but that may be from the weather"   Currently in Pain? No/denies   Pain Score 0-No pain                         [facility] Adult PT Treatment/Exercise - 06/19/15 0001    High Level Balance   High Level Balance Activities Negotiating over obstacles  side step over  obstacles; back over obstacles   High Level Balance Comments Syep up and over on BOSU x5; Lateral step up and over on BOSU x5; step up onm 6 in box from airex x10 each, SLS 10 sec x3 each   Knee/Hip Exercises: Aerobic   Nustep L 5 7 min   Knee/Hip Exercises: Seated   Other Seated Knee/Hip Exercises L ankle DF green Tband 2x15                   PT Short Term Goals - 04/03/15 1403    PT SHORT TERM GOAL #1   Title Pt will be independent with HEP   Time 1   Period Weeks   Status New           PT Long Term Goals - 06/12/15 1704    PT LONG TERM GOAL #5   Title Patient will be able to maintain single leg stance 20 seconds on each foot   Time 4   Period Weeks   Status New   Additional Long Term Goals   Additional Long Term Goals Yes   PT LONG TERM GOAL #6  Title Patient will return to a gym setting and be safe   Time 4   Period Weeks   Status New               Plan - 06/19/15 1514    Clinical Impression Statement Pt 9 minutes late for today's treatment. Pt continues to be challenged with standing BOSU activities. Pt unable to complete any of the SLS for the full duration of 10 seconds.   Rehab Potential Good   PT Frequency 2x / week   PT Duration 4 weeks   PT Treatment/Interventions ADLs/Self Care Home Management   PT Next Visit Plan balance interventions      Patient will benefit from skilled therapeutic intervention in order to improve the following deficits and impairments:  Decreased balance, Decreased strength, Difficulty walking  Visit Diagnosis: Muscle weakness (generalized)  Difficulty walking     Problem List Patient Active Problem List   Diagnosis Date Noted  . HLD (hyperlipidemia)   . Overactive bladder 05/08/2015  . Aphasia   . Cervical spondylitis ([place])   . Numbness and tingling in left hand 03/21/2015  . TIA (transient ischemic attack) 03/21/2015  . UTI (lower urinary tract infection) 03/21/2015  . Physical exam 10/05/2014  .  Abdominal pain 05/17/2014  . Atypical chest pain 05/17/2014  . Allergic rhinitis 05/17/2014  . Fatigue 04/04/2014  . Anxiety and depression 07/25/2013  . Weight loss, unintentional 07/25/2013  . Sinusitis, acute, maxillary 07/13/2013  . Cerumen impaction 07/13/2013  . Memory deficits 02/15/2013  . Lumbar spondylosis 09/21/2012  . Single episode of elevated blood pressure 09/29/2011  . Palpitations 09/16/2010  . Left leg pain 07/04/2010  . OTHER ABNORMALITY OF URINATION 09/25/2009  . DERMATITIS 03/23/2009  . Cerebrovascular disease 04/03/2008  . DYSPHONIA 04/03/2008  . CHEST PAIN, ATYPICAL 04/03/2008  . ASTHMATIC BRONCHITIS, ACUTE 04/02/2008  . DIVERTICULOSIS OF COLON 04/02/2008  . Irritable bowel syndrome 04/02/2008  . DEGENERATIVE JOINT DISEASE 04/02/2008  . HEMATURIA, MICROSCOPIC, HX OF 04/02/2008  . Vitamin D deficiency 03/28/2008  . HYPERCHOLESTEROLEMIA 03/28/2008  . FIBROMYALGIA 03/28/2008  . URINARY INCONTINENCE 03/28/2008  . ACUTE CYSTITIS 03/22/2008  . GERD 08/02/2007  . Gastroparesis 08/02/2007  . HEMORRHOIDS 07/30/2007  . ESOPHAGEAL STRICTURE 07/30/2007    Scot Jun, PTA 06/19/2015, 3:16 PM  [place], [place], [address] Phone: [phone]   Fax:  [phone]  Name: Paige Pena MRN: [id number] Date of Birth: Aug 05, 1940

## 2015-06-21 ENCOUNTER — Ambulatory Visit: Payer: Medicare Other | Admitting: Physical Therapy

## 2015-06-26 ENCOUNTER — Ambulatory Visit: Payer: Medicare Other | Attending: Family Medicine | Admitting: Physical Therapy

## 2015-06-26 ENCOUNTER — Encounter: Payer: Self-pay | Admitting: Physical Therapy

## 2015-06-26 DIAGNOSIS — M6281 Muscle weakness (generalized): Secondary | ICD-10-CM | POA: Diagnosis present

## 2015-06-26 DIAGNOSIS — R262 Difficulty in walking, not elsewhere classified: Secondary | ICD-10-CM | POA: Insufficient documentation

## 2015-06-26 NOTE — Therapy (Signed)
[place], [place], [address] Phone: [phone]   Fax:  [phone]  Physical Therapy Treatment  Patient Details  Name: Paige Pena MRN: [id number] Date of Birth: 1940/11/10 Referring Provider: [name]  Encounter Date: 06/26/2015      PT End of Session - 06/26/15 1512    Visit Number 16   Date for PT Re-Evaluation 06/01/15   PT Start Time 1441   PT Stop Time 1515   PT Time Calculation (min) 34 min   Activity Tolerance Patient tolerated treatment well   Behavior During Therapy [facility] for tasks assessed/performed      Past Medical History  Diagnosis Date  . Dysphonia   . Asthmatic bronchitis   . Chest pain, atypical   . Cerebrovascular disease   . Hypercholesteremia   . GERD (gastroesophageal reflux disease)   . Acute cystitis   . Esophageal stricture   . Gastroparesis   . Diverticulosis of colon   . IBS (irritable bowel syndrome)   . Hemorrhoids   . Urinary incontinence   . Hematuria, microscopic   . DJD (degenerative joint disease)   . Fibromyalgia   . Anxiety   . Vitamin D deficiency   . RLS (restless legs syndrome)   . Aphasia     transsient aphasia Jan 2012  . Memory loss   . Memory deficits 02/15/2013  . TIA (transient ischemic attack)     Past Surgical History  Procedure Laterality Date  . Abdominal hysterectomy  1995  . Corneal transplant for keratonconus    . Right bunion surgery    . Left foot fracture with orif  07/2004    Dr. Sharol Given  . Eye surgery  2013    There were no vitals filed for this visit.      Subjective Assessment - 06/26/15 1441    Subjective "Not good upset stomach"   Currently in Pain? No/denies   Pain Score 0-No pain                         [facility] Adult PT Treatment/Exercise - 06/26/15 0001    High Level Balance   High Level Balance Activities Negotiating over obstacles   High Level Balance Comments Single leg stance with  ball toss 2x10; Single leg on airex with yellow Tband cross body x5 each; Sport cord waling #30 4 way x4   Lumbar Exercises: Aerobic   UBE (Upper Arm Bike) L@ 2 frd/2rev   Knee/Hip Exercises: Aerobic   Nustep L 5 6 min   Knee/Hip Exercises: Machines for Strengthening   Cybex Leg Press #30 2x15                   PT Short Term Goals - 04/03/15 1403    PT SHORT TERM GOAL #1   Title Pt will be independent with HEP   Time 1   Period Weeks   Status New           PT Long Term Goals - 06/12/15 1704    PT LONG TERM GOAL #5   Title Patient will be able to maintain single leg stance 20 seconds on each foot   Time 4   Period Weeks   Status New   Additional Long Term Goals   Additional Long Term Goals Yes   PT LONG TERM GOAL #6   Title Patient will return to a gym setting and be  safe   Time 4   Period Weeks   Status New               Plan - 06/26/15 1512    Clinical Impression Statement Pt 11 minutes late for PT treatment, challenged with all single leg stance activities. Pt also challenged with sport cord walking more so with side stepping..    Rehab Potential Good   PT Frequency 2x / week   PT Duration 4 weeks   PT Treatment/Interventions ADLs/Self Care Home Management   PT Next Visit Plan balancew interventions      Patient will benefit from skilled therapeutic intervention in order to improve the following deficits and impairments:  Decreased balance, Decreased strength, Difficulty walking  Visit Diagnosis: Muscle weakness (generalized)  Difficulty walking     Problem List Patient Active Problem List   Diagnosis Date Noted  . HLD (hyperlipidemia)   . Overactive bladder 05/08/2015  . Aphasia   . Cervical spondylitis ([place])   . Numbness and tingling in left hand 03/21/2015  . TIA (transient ischemic attack) 03/21/2015  . UTI (lower urinary tract infection) 03/21/2015  . Physical exam 10/05/2014  . Abdominal pain 05/17/2014  . Atypical chest  pain 05/17/2014  . Allergic rhinitis 05/17/2014  . Fatigue 04/04/2014  . Anxiety and depression 07/25/2013  . Weight loss, unintentional 07/25/2013  . Sinusitis, acute, maxillary 07/13/2013  . Cerumen impaction 07/13/2013  . Memory deficits 02/15/2013  . Lumbar spondylosis 09/21/2012  . Single episode of elevated blood pressure 09/29/2011  . Palpitations 09/16/2010  . Left leg pain 07/04/2010  . OTHER ABNORMALITY OF URINATION 09/25/2009  . DERMATITIS 03/23/2009  . Cerebrovascular disease 04/03/2008  . DYSPHONIA 04/03/2008  . CHEST PAIN, ATYPICAL 04/03/2008  . ASTHMATIC BRONCHITIS, ACUTE 04/02/2008  . DIVERTICULOSIS OF COLON 04/02/2008  . Irritable bowel syndrome 04/02/2008  . DEGENERATIVE JOINT DISEASE 04/02/2008  . HEMATURIA, MICROSCOPIC, HX OF 04/02/2008  . Vitamin D deficiency 03/28/2008  . HYPERCHOLESTEROLEMIA 03/28/2008  . FIBROMYALGIA 03/28/2008  . URINARY INCONTINENCE 03/28/2008  . ACUTE CYSTITIS 03/22/2008  . GERD 08/02/2007  . Gastroparesis 08/02/2007  . HEMORRHOIDS 07/30/2007  . ESOPHAGEAL STRICTURE 07/30/2007    Scot Jun, PTA  06/26/2015, 3:14 PM  [place], [place], [address] Phone: [phone]   Fax:  [phone]  Name: Paige Pena MRN: [id number] Date of Birth: 1940-12-22

## 2015-06-28 ENCOUNTER — Ambulatory Visit: Payer: Medicare Other | Admitting: Physical Therapy

## 2015-06-28 ENCOUNTER — Telehealth: Payer: Self-pay | Admitting: Family Medicine

## 2015-06-28 NOTE — Telephone Encounter (Signed)
Pt has made an appt to be seen tomorrow, however pt has called back asking for a referral to see Dr Kathrine Cords at [facility] [place] for tomorrow and asking if this is possible.

## 2015-06-28 NOTE — Telephone Encounter (Signed)
Patient notified of PCP recommendations and is agreement and expresses an understanding. Will be here tomorrow since there is no way we can get her a GI appt tomorrow.

## 2015-06-29 ENCOUNTER — Ambulatory Visit: Payer: Medicare Other | Admitting: Family Medicine

## 2015-06-29 DIAGNOSIS — Z0289 Encounter for other administrative examinations: Secondary | ICD-10-CM

## 2015-06-29 NOTE — Telephone Encounter (Signed)
Pt is scheduled for [facility], she appreciates that we had [place] call and check up on her.

## 2015-06-29 NOTE — Telephone Encounter (Signed)
No need for a hospital f/u at this time as it is 6 weeks after her hospital stay.  The point of the hospital f/u was to get physical therapy arranged- which she is already getting.  She has an appt upcoming w/ Cardiology next week and Neuro this summer.  If she has concerns, we'd be happy to see her but this is not a requirement if she's doing well

## 2015-06-29 NOTE — Telephone Encounter (Signed)
Called Paige Pena and LMOVM

## 2015-06-29 NOTE — Telephone Encounter (Signed)
I also called and left a message for patient checking on her since missing her appointment for today. I offered to put her on the Saturday clinic as well and advised her to call back if that would work better for her.

## 2015-06-29 NOTE — Telephone Encounter (Signed)
Pt no showed/arriving 20 mins late for her appt today, pt asked if she needs an appt for hosp f/u since she was not seen when she was discharged last time

## 2015-06-30 ENCOUNTER — Encounter: Payer: Self-pay | Admitting: Family Medicine

## 2015-06-30 ENCOUNTER — Ambulatory Visit (INDEPENDENT_AMBULATORY_CARE_PROVIDER_SITE_OTHER): Payer: Medicare Other | Admitting: Family Medicine

## 2015-06-30 VITALS — BP 124/80 | Temp 98.2°F | Ht 61.0 in | Wt 136.0 lb

## 2015-06-30 DIAGNOSIS — K589 Irritable bowel syndrome without diarrhea: Secondary | ICD-10-CM

## 2015-06-30 DIAGNOSIS — K219 Gastro-esophageal reflux disease without esophagitis: Secondary | ICD-10-CM | POA: Diagnosis not present

## 2015-06-30 MED ORDER — SUCRALFATE 1 GM/10ML PO SUSP: 1.0000 g | Freq: Three times a day (TID) | ORAL | Status: DC

## 2015-06-30 MED ORDER — RANITIDINE HCL 300 MG PO TABS: 300.0000 mg | ORAL_TABLET | Freq: Every day | ORAL | Status: DC

## 2015-06-30 NOTE — Assessment & Plan Note (Signed)
With chronic constipation but no signs of a bowel obst Adv use of miralax daily -then prn to achieve regular bms  Inc fluids and fiber  Update if not imp  Will call office reg new GI ref  Rev last colonoscopy today

## 2015-06-30 NOTE — Progress Notes (Signed)
Subjective:    Patient ID: Paige Pena, female    DOB: 1940-11-12, 75 y.o.   MRN: [id number]  HPI Here with exacerbation of chronic GI problems   GERD- now with throat symptoms and hoarseness Bloated abdomen with discomfort in epigastric area (pressure and discomfort - with burning at base of throat)  No particular timing  Eating makes it worse  Eating a bland diet   Also some constipation  Does not take anything for that  Usually has bm daily - unless constipation acts up and has little pellets of stool  Tries to drink water  Not eating more    Hx of GERD and esoph stricture in the past and and gastroparesis, and IBS  Takes omeprazole 40 once daily  Had a flare up in Dec - took zantac additionally at that time  Tried carfate in the past - used to help her (better with liquid)  donperidone - she gets from [place] for gastroparesis   GI is at [facility] - in the process of getting someone local   Patient Active Problem List   Diagnosis Date Noted  . HLD (hyperlipidemia)   . Overactive bladder 05/08/2015  . Aphasia   . Cervical spondylitis ([place])   . Numbness and tingling in left hand 03/21/2015  . TIA (transient ischemic attack) 03/21/2015  . UTI (lower urinary tract infection) 03/21/2015  . Physical exam 10/05/2014  . Abdominal pain 05/17/2014  . Atypical chest pain 05/17/2014  . Allergic rhinitis 05/17/2014  . Fatigue 04/04/2014  . Anxiety and depression 07/25/2013  . Weight loss, unintentional 07/25/2013  . Sinusitis, acute, maxillary 07/13/2013  . Cerumen impaction 07/13/2013  . Memory deficits 02/15/2013  . Lumbar spondylosis 09/21/2012  . Single episode of elevated blood pressure 09/29/2011  . Palpitations 09/16/2010  . Left leg pain 07/04/2010  . OTHER ABNORMALITY OF URINATION 09/25/2009  . DERMATITIS 03/23/2009  . Cerebrovascular disease 04/03/2008  . DYSPHONIA 04/03/2008  . CHEST PAIN, ATYPICAL 04/03/2008  . ASTHMATIC BRONCHITIS, ACUTE 04/02/2008  .  DIVERTICULOSIS OF COLON 04/02/2008  . Irritable bowel syndrome 04/02/2008  . DEGENERATIVE JOINT DISEASE 04/02/2008  . HEMATURIA, MICROSCOPIC, HX OF 04/02/2008  . Vitamin D deficiency 03/28/2008  . HYPERCHOLESTEROLEMIA 03/28/2008  . FIBROMYALGIA 03/28/2008  . URINARY INCONTINENCE 03/28/2008  . ACUTE CYSTITIS 03/22/2008  . GERD 08/02/2007  . Gastroparesis 08/02/2007  . HEMORRHOIDS 07/30/2007  . ESOPHAGEAL STRICTURE 07/30/2007   Past Medical History  Diagnosis Date  . Dysphonia   . Asthmatic bronchitis   . Chest pain, atypical   . Cerebrovascular disease   . Hypercholesteremia   . GERD (gastroesophageal reflux disease)   . Acute cystitis   . Esophageal stricture   . Gastroparesis   . Diverticulosis of colon   . IBS (irritable bowel syndrome)   . Hemorrhoids   . Urinary incontinence   . Hematuria, microscopic   . DJD (degenerative joint disease)   . Fibromyalgia   . Anxiety   . Vitamin D deficiency   . RLS (restless legs syndrome)   . Aphasia     transsient aphasia Jan 2012  . Memory loss   . Memory deficits 02/15/2013  . TIA (transient ischemic attack)    Past Surgical History  Procedure Laterality Date  . Abdominal hysterectomy  1995  . Corneal transplant for keratonconus    . Right bunion surgery    . Left foot fracture with orif  07/2004    Dr. Sharol Given  . Eye surgery  2013  Social History  Substance Use Topics  . Smoking status: Never Smoker   . Smokeless tobacco: Never Used  . Alcohol Use: No   Family History  Problem Relation Age of Onset  . Breast cancer      aunt  . Diabetes      aunt,brother,uncle  . Kidney disease      father  . Hyperlipidemia Sister   . Diabetes Brother   . Hypertension Mother   . Anemia Mother   . Kidney disease Father   . Dementia Maternal Grandmother    Allergies  Allergen Reactions  . Morphine Other (See Comments)    REACTION: lethargy/malaise  . Metoclopramide Hcl Anxiety and Other (See Comments)    REACTION:  anxious and out of her mind  . Penicillins Itching and Rash    Has patient had a PCN reaction causing immediate rash, facial/tongue/throat swelling, SOB or lightheadedness with hypotension: No Has patient had a PCN reaction causing severe rash involving mucus membranes or skin necrosis: No Has patient had a PCN reaction that required hospitalization No Has patient had a PCN reaction occurring within the last 10 years: Yes If all of the above answers are "NO", then may proceed with Cephalosporin use.    . Prednisone Itching and Rash  . Sulfonamide Derivatives Itching and Rash   Current Outpatient Prescriptions on File Prior to Visit  Medication Sig Dispense Refill  . ALPRAZolam (XANAX) 0.5 MG tablet TAKE  1/2 -1 TABLET BY MOUTH THREE TIMES DAILY AS NEEDED (Patient taking differently: Take 0.5 mg by mouth 3 (three) times daily as needed for anxiety. TAKE  1/2 -1 TABLET BY MOUTH THREE TIMES DAILY AS NEEDED) 90 tablet 2  . aspirin (ECOTRIN) 325 MG EC tablet Take 325 mg by mouth daily.      . DULoxetine (CYMBALTA) 20 MG capsule TAKE ONE CAPSULE BY MOUTH DAILY 30 capsule 3  . fluticasone (FLONASE) 50 MCG/ACT nasal spray Place 2 sprays into both nostrils daily. (Patient taking differently: Place 2 sprays into both nostrils daily as needed for allergies. ) 16 g 2  . Iron-Vitamins (GERITOL) LIQD Take 15 mLs by mouth daily.    Marland Kitchen KRILL OIL OMEGA-3 PO Take 1 capsule by mouth daily.    Marland Kitchen omeprazole (PRILOSEC) 40 MG capsule TAKE ONE CAPSULE BY MOUTH DAILY 90 capsule 1  . prednisoLONE acetate (PRED FORTE) 1 % ophthalmic suspension Place 1 drop into both eyes daily.     . rosuvastatin (CRESTOR) 5 MG tablet Take 1 tablet by mouth three days each week. (Patient taking differently: Take 5 mg by mouth 2 (two) times a week. Tuesday and Saturday) 50 tablet 1  . tolterodine (DETROL LA) 4 MG 24 hr capsule Take 1 capsule (4 mg total) by mouth at bedtime. 90 capsule 1   No current facility-administered medications on  file prior to visit.    Review of Systems Review of Systems  Constitutional: Negative for fever, appetite change, fatigue and unexpected weight change.  Eyes: Negative for pain and visual disturbance.  Respiratory: Negative for cough and shortness of breath.   Cardiovascular: Negative for cp or palpitations    Gastrointestinal: Negative for blood in stool, vomiting  Genitourinary: Negative for urgency and frequency.  Skin: Negative for pallor or rash   Neurological: Negative for weakness, light-headedness, numbness and headaches.  Hematological: Negative for adenopathy. Does not bruise/bleed easily.  Psychiatric/Behavioral: Negative for dysphoric mood. The patient is not nervous/anxious.         Objective:  Physical Exam  Constitutional: She appears well-developed and well-nourished. No distress.  overwt and well appearing   HENT:  Head: Normocephalic and atraumatic.  Mouth/Throat: Oropharynx is clear and moist.  Eyes: Conjunctivae and EOM are normal. Pupils are equal, round, and reactive to light. No scleral icterus.  Neck: Normal range of motion. Neck supple.  Cardiovascular: Normal rate, regular rhythm and normal heart sounds.   Pulmonary/Chest: Effort normal and breath sounds normal. No respiratory distress. She has no wheezes. She has no rales.  Abdominal: Soft. Bowel sounds are normal. She exhibits no distension, no abdominal bruit, no pulsatile midline mass and no mass. There is no hepatosplenomegaly. There is tenderness in the epigastric area. There is no rigidity, no rebound, no guarding, no CVA tenderness, no tenderness at McBurney's point and negative Murphy's sign.  Musculoskeletal: She exhibits no edema.  Lymphadenopathy:    She has no cervical adenopathy.  Neurological: She is alert. She has normal reflexes.  Skin: Skin is warm and dry. No erythema. No pallor.  No jaundice   Psychiatric: She has a normal mood and affect.          Assessment & Plan:   Problem  List Items Addressed This Visit      Digestive   GERD - Primary    Acute on chronic return of epigastric discomfort and heartburn  Imp in the past with addn of carafate tid and zantac 300 Continue ppi No signs of GI bleed  No dysphagia Will call office Monday re: her GI referral  May need an EGD Update if not starting to improve in a week or if worsening        Relevant Medications   ranitidine (ZANTAC) 300 MG tablet   sucralfate (CARAFATE) 1 GM/10ML suspension   Irritable bowel syndrome    With chronic constipation but no signs of a bowel obst Adv use of miralax daily -then prn to achieve regular bms  Inc fluids and fiber  Update if not imp  Will call office reg new GI ref  Rev last colonoscopy today      Relevant Medications   ranitidine (ZANTAC) 300 MG tablet   sucralfate (CARAFATE) 1 GM/10ML suspension

## 2015-06-30 NOTE — Assessment & Plan Note (Signed)
Acute on chronic return of epigastric discomfort and heartburn  Imp in the past with addn of carafate tid and zantac 300 Continue ppi No signs of GI bleed  No dysphagia Will call office Monday re: her GI referral  May need an EGD Update if not starting to improve in a week or if worsening

## 2015-06-30 NOTE — Progress Notes (Signed)
Pre visit review using our clinic review tool, if applicable. No additional management support is needed unless otherwise documented below in the visit note. 

## 2015-06-30 NOTE — Patient Instructions (Signed)
Get miralax over the counter  Take as directed mixed with fluid of your choice daily - then when you begin to have regular bowel movements you can decide how often you need it  Get back on zantac as needed Try the carafate as needed to get some relief  Call the office on Monday to check on the status of GI referral

## 2015-07-02 ENCOUNTER — Telehealth: Payer: Self-pay | Admitting: Family Medicine

## 2015-07-02 DIAGNOSIS — K589 Irritable bowel syndrome without diarrhea: Secondary | ICD-10-CM

## 2015-07-02 DIAGNOSIS — K222 Esophageal obstruction: Secondary | ICD-10-CM

## 2015-07-02 DIAGNOSIS — K219 Gastro-esophageal reflux disease without esophagitis: Secondary | ICD-10-CM

## 2015-07-02 DIAGNOSIS — K5730 Diverticulosis of large intestine without perforation or abscess without bleeding: Secondary | ICD-10-CM

## 2015-07-02 NOTE — Telephone Encounter (Signed)
Pt states that she want to see Dr Kathrine Cords at [facility] and to please send referral there.

## 2015-07-02 NOTE — Telephone Encounter (Signed)
Referral placed today

## 2015-07-02 NOTE — Telephone Encounter (Signed)
Pt calling checking status on GI referral

## 2015-07-03 ENCOUNTER — Ambulatory Visit: Payer: Medicare Other | Admitting: Physical Therapy

## 2015-07-03 NOTE — Telephone Encounter (Signed)
Referral faxed to [facility]/awaiting appt °

## 2015-07-04 ENCOUNTER — Other Ambulatory Visit (INDEPENDENT_AMBULATORY_CARE_PROVIDER_SITE_OTHER): Payer: Medicare Other | Admitting: *Deleted

## 2015-07-04 DIAGNOSIS — E7800 Pure hypercholesterolemia, unspecified: Secondary | ICD-10-CM

## 2015-07-04 LAB — LIPID PANEL
CHOL/HDL RATIO: 2.6 ratio (ref <=5.0)
Cholesterol: 187 mg/dL (ref 125–200)
HDL: 72 mg/dL (ref >=46)
LDL Cholesterol: 104 mg/dL (ref <130)
TRIGLYCERIDES: 57 mg/dL (ref <150)
VLDL: 11 mg/dL (ref <30)

## 2015-07-04 LAB — HEPATIC FUNCTION PANEL
ALBUMIN: 4 g/dL (ref 3.6–5.1)
ALK PHOS: 89 U/L (ref 33–130)
ALT: 15 U/L (ref 6–29)
AST: 26 U/L (ref 10–35)
Bilirubin, Direct: 0.1 mg/dL (ref <=0.2)
Indirect Bilirubin: 0.4 mg/dL (ref 0.2–1.2)
TOTAL PROTEIN: 6.8 g/dL (ref 6.1–8.1)
Total Bilirubin: 0.5 mg/dL (ref 0.2–1.2)

## 2015-07-05 ENCOUNTER — Ambulatory Visit: Payer: Medicare Other | Admitting: Physical Therapy

## 2015-07-06 ENCOUNTER — Encounter: Payer: Self-pay | Admitting: General Practice

## 2015-07-10 ENCOUNTER — Telehealth: Payer: Self-pay | Admitting: Pharmacist

## 2015-07-10 ENCOUNTER — Ambulatory Visit: Payer: Medicare Other | Admitting: Physical Therapy

## 2015-07-10 MED ORDER — EZETIMIBE 10 MG PO TABS: 10.0000 mg | ORAL_TABLET | Freq: Every day | ORAL | Status: DC

## 2015-07-10 NOTE — Telephone Encounter (Signed)
New MEssage  Pt calling to speak w/ Gay Filler about her recent lab results. Please call back and discuss.

## 2015-07-10 NOTE — Telephone Encounter (Signed)
Ok to start Zetia 10mg  for pt at recommendation of lipid clinic, #30, 6 refills

## 2015-07-10 NOTE — Telephone Encounter (Signed)
Spoke with patient regarding her lipid panel results. Since pt has had a recent TIA in January, would recommend aiming for an LDL < 70. Most recently 104 on Crestor 5mg  BIW (highest tolerated statin dose). Would recommend adding Zetia 10mg  daily. Since patient has not been seen by one of our cardiologists and was referred before our cardiology practices merged, do not have an active MD to send rx in under. Will route note to patient's PCP Dr. [name] with recommendation to start Zetia 10mg  daily. Advised pt that PCP would follow up with her regarding potential new prescription.

## 2015-07-10 NOTE — Addendum Note (Signed)
Addended by: Davis Gourd on: 07/10/2015 11:24 AM   Modules accepted: Orders

## 2015-07-10 NOTE — Telephone Encounter (Signed)
Called pt and left message on voicemail to return call. Medication was filled to patient's local pharmacy.

## 2015-07-11 NOTE — Telephone Encounter (Signed)
Patient notified of PCP recommendations and is agreement and expresses an understanding.  

## 2015-07-12 ENCOUNTER — Ambulatory Visit: Payer: Medicare Other | Admitting: Physical Therapy

## 2015-07-12 ENCOUNTER — Encounter: Payer: Self-pay | Admitting: Physical Therapy

## 2015-07-12 DIAGNOSIS — M6281 Muscle weakness (generalized): Secondary | ICD-10-CM | POA: Diagnosis not present

## 2015-07-12 DIAGNOSIS — R262 Difficulty in walking, not elsewhere classified: Secondary | ICD-10-CM

## 2015-07-12 NOTE — Therapy (Signed)
[place], [place], [address] Phone: [phone]   Fax:  [phone]  Physical Therapy Treatment  Patient Details  Name: Paige Pena MRN: [id number] Date of Birth: 1940/06/28 Referring Provider: [name]  Encounter Date: 07/12/2015      PT End of Session - 07/12/15 1426    Visit Number 17   Date for PT Re-Evaluation 06/01/15   PT Start Time 1351   PT Stop Time 1426   PT Time Calculation (min) 35 min   Activity Tolerance Patient tolerated treatment well   Behavior During Therapy [facility] for tasks assessed/performed      Past Medical History  Diagnosis Date  . Dysphonia   . Asthmatic bronchitis   . Chest pain, atypical   . Cerebrovascular disease   . Hypercholesteremia   . GERD (gastroesophageal reflux disease)   . Acute cystitis   . Esophageal stricture   . Gastroparesis   . Diverticulosis of colon   . IBS (irritable bowel syndrome)   . Hemorrhoids   . Urinary incontinence   . Hematuria, microscopic   . DJD (degenerative joint disease)   . Fibromyalgia   . Anxiety   . Vitamin D deficiency   . RLS (restless legs syndrome)   . Aphasia     transsient aphasia Jan 2012  . Memory loss   . Memory deficits 02/15/2013  . TIA (transient ischemic attack)     Past Surgical History  Procedure Laterality Date  . Abdominal hysterectomy  1995  . Corneal transplant for keratonconus    . Right bunion surgery    . Left foot fracture with orif  07/2004    Dr. Sharol Given  . Eye surgery  2013    There were no vitals filed for this visit.      Subjective Assessment - 07/12/15 1353    Subjective Pt reports that she has been sick   Currently in Pain? No/denies   Pain Score 0-No pain                         [facility] Adult PT Treatment/Exercise - 07/12/15 0001    Knee/Hip Exercises: Aerobic   Nustep L 4 6 min   Knee/Hip Exercises: Machines for Strengthening   Cybex Knee Extension  10# 2 sets 15, #5 x10 LLE only    Cybex Knee Flexion 25# 2 sets 15, LLE only #10 x10    Cybex Leg Press #20 2x15, SL #20 x5    Knee/Hip Exercises: Seated   Other Seated Knee/Hip Exercises Ankle 4 way both blue Tband x15                  PT Short Term Goals - 04/03/15 1403    PT SHORT TERM GOAL #1   Title Pt will be independent with HEP   Time 1   Period Weeks   Status New           PT Long Term Goals - 06/12/15 1704    PT LONG TERM GOAL #5   Title Patient will be able to maintain single leg stance 20 seconds on each foot   Time 4   Period Weeks   Status New   Additional Long Term Goals   Additional Long Term Goals Yes   PT LONG TERM GOAL #6   Title Patient will return to a gym setting and be safe   Time 4  Period Weeks   Status New               Plan - 07/12/15 1426    Clinical Impression Statement Pt 5 minutes late for pt treatment, increase time needed to complete exercises. Pt reports quads burning with leg extensions.    Rehab Potential Good   PT Frequency 2x / week   PT Duration 4 weeks   PT Treatment/Interventions ADLs/Self Care Home Management   PT Next Visit Plan D/C PT      Patient will benefit from skilled therapeutic intervention in order to improve the following deficits and impairments:  Decreased balance, Decreased strength, Difficulty walking  Visit Diagnosis: Muscle weakness (generalized)  Difficulty walking     Problem List Patient Active Problem List   Diagnosis Date Noted  . HLD (hyperlipidemia)   . Overactive bladder 05/08/2015  . Aphasia   . Cervical spondylitis ([place])   . Numbness and tingling in left hand 03/21/2015  . TIA (transient ischemic attack) 03/21/2015  . UTI (lower urinary tract infection) 03/21/2015  . Physical exam 10/05/2014  . Abdominal pain 05/17/2014  . Atypical chest pain 05/17/2014  . Allergic rhinitis 05/17/2014  . Fatigue 04/04/2014  . Anxiety and depression 07/25/2013  . Weight loss,  unintentional 07/25/2013  . Sinusitis, acute, maxillary 07/13/2013  . Cerumen impaction 07/13/2013  . Memory deficits 02/15/2013  . Lumbar spondylosis 09/21/2012  . Single episode of elevated blood pressure 09/29/2011  . Palpitations 09/16/2010  . Left leg pain 07/04/2010  . OTHER ABNORMALITY OF URINATION 09/25/2009  . DERMATITIS 03/23/2009  . Cerebrovascular disease 04/03/2008  . DYSPHONIA 04/03/2008  . CHEST PAIN, ATYPICAL 04/03/2008  . ASTHMATIC BRONCHITIS, ACUTE 04/02/2008  . Diverticulosis of large intestine 04/02/2008  . Irritable bowel syndrome 04/02/2008  . DEGENERATIVE JOINT DISEASE 04/02/2008  . HEMATURIA, MICROSCOPIC, HX OF 04/02/2008  . Vitamin D deficiency 03/28/2008  . HYPERCHOLESTEROLEMIA 03/28/2008  . FIBROMYALGIA 03/28/2008  . URINARY INCONTINENCE 03/28/2008  . ACUTE CYSTITIS 03/22/2008  . GERD 08/02/2007  . Gastroparesis 08/02/2007  . HEMORRHOIDS 07/30/2007  . ESOPHAGEAL STRICTURE 07/30/2007    PHYSICAL THERAPY DISCHARGE SUMMARY  Visits from Start of Care: 17  Plan: Patient agrees to discharge.  Patient goals were partially met. Patient is being discharged due to being pleased with the current functional level.  ?????       Scot Jun, PTA 07/12/2015, 2:28 PM  [place], [place], [address] Phone: [phone]   Fax:  [phone]  Name: Paige Pena MRN: [id number] Date of Birth: [birth year]

## 2015-08-02 ENCOUNTER — Other Ambulatory Visit: Payer: Self-pay | Admitting: Family Medicine

## 2015-08-02 NOTE — Telephone Encounter (Signed)
Medication filled to pharmacy as requested.   

## 2015-08-13 ENCOUNTER — Other Ambulatory Visit: Payer: Self-pay | Admitting: Family Medicine

## 2015-08-13 NOTE — Telephone Encounter (Signed)
Medication filled to pharmacy as requested.   

## 2015-08-14 ENCOUNTER — Ambulatory Visit: Payer: Medicare Other | Admitting: Physician Assistant

## 2015-08-14 DIAGNOSIS — Z0289 Encounter for other administrative examinations: Secondary | ICD-10-CM

## 2015-08-15 ENCOUNTER — Encounter: Payer: Self-pay | Admitting: Physician Assistant

## 2015-08-15 ENCOUNTER — Ambulatory Visit (INDEPENDENT_AMBULATORY_CARE_PROVIDER_SITE_OTHER): Payer: Medicare Other | Admitting: Physician Assistant

## 2015-08-15 VITALS — BP 120/86 | HR 78 | Temp 98.6°F | Resp 16 | Ht 61.0 in | Wt 133.1 lb

## 2015-08-15 DIAGNOSIS — J019 Acute sinusitis, unspecified: Secondary | ICD-10-CM | POA: Diagnosis not present

## 2015-08-15 DIAGNOSIS — B9689 Other specified bacterial agents as the cause of diseases classified elsewhere: Secondary | ICD-10-CM

## 2015-08-15 MED ORDER — DOXYCYCLINE HYCLATE 100 MG PO CAPS: 100.0000 mg | ORAL_CAPSULE | Freq: Two times a day (BID) | ORAL | Status: DC

## 2015-08-15 NOTE — Progress Notes (Signed)
Pre visit review using our clinic review tool, if applicable. No additional management support is needed unless otherwise documented below in the visit note/SLS  

## 2015-08-15 NOTE — Patient Instructions (Signed)
Please take antibiotic as directed.  Increase fluid intake.  Use Saline nasal spray.  Take a daily multivitamin. Delsym for cough and continue your Flonase.  Place a humidifier in the bedroom.  Please call or return clinic if symptoms are not improving.  Sinusitis Sinusitis is redness, soreness, and swelling (inflammation) of the paranasal sinuses. Paranasal sinuses are air pockets within the bones of your face (beneath the eyes, the middle of the forehead, or above the eyes). In healthy paranasal sinuses, mucus is able to drain out, and air is able to circulate through them by way of your nose. However, when your paranasal sinuses are inflamed, mucus and air can become trapped. This can allow bacteria and other germs to grow and cause infection. Sinusitis can develop quickly and last only a short time (acute) or continue over a long period (chronic). Sinusitis that lasts for more than 12 weeks is considered chronic.  CAUSES  Causes of sinusitis include:  Allergies.  Structural abnormalities, such as displacement of the cartilage that separates your nostrils (deviated septum), which can decrease the air flow through your nose and sinuses and affect sinus drainage.  Functional abnormalities, such as when the small hairs (cilia) that line your sinuses and help remove mucus do not work properly or are not present. SYMPTOMS  Symptoms of acute and chronic sinusitis are the same. The primary symptoms are pain and pressure around the affected sinuses. Other symptoms include:  Upper toothache.  Earache.  Headache.  Bad breath.  Decreased sense of smell and taste.  A cough, which worsens when you are lying flat.  Fatigue.  Fever.  Thick drainage from your nose, which often is green and may contain pus (purulent).  Swelling and warmth over the affected sinuses. DIAGNOSIS  Your caregiver will perform a physical exam. During the exam, your caregiver may:  Look in your nose for signs of  abnormal growths in your nostrils (nasal polyps).  Tap over the affected sinus to check for signs of infection.  View the inside of your sinuses (endoscopy) with a special imaging device with a light attached (endoscope), which is inserted into your sinuses. If your caregiver suspects that you have chronic sinusitis, one or more of the following tests may be recommended:  Allergy tests.  Nasal culture A sample of mucus is taken from your nose and sent to a lab and screened for bacteria.  Nasal cytology A sample of mucus is taken from your nose and examined by your caregiver to determine if your sinusitis is related to an allergy. TREATMENT  Most cases of acute sinusitis are related to a viral infection and will resolve on their own within 10 days. Sometimes medicines are prescribed to help relieve symptoms (pain medicine, decongestants, nasal steroid sprays, or saline sprays).  However, for sinusitis related to a bacterial infection, your caregiver will prescribe antibiotic medicines. These are medicines that will help kill the bacteria causing the infection.  Rarely, sinusitis is caused by a fungal infection. In theses cases, your caregiver will prescribe antifungal medicine. For some cases of chronic sinusitis, surgery is needed. Generally, these are cases in which sinusitis recurs more than 3 times per year, despite other treatments. HOME CARE INSTRUCTIONS   Drink plenty of water. Water helps thin the mucus so your sinuses can drain more easily.  Use a humidifier.  Inhale steam 3 to 4 times a day (for example, sit in the bathroom with the shower running).  Apply a warm, moist washcloth to your  face 3 to 4 times a day, or as directed by your caregiver.  Use saline nasal sprays to help moisten and clean your sinuses.  Take over-the-counter or prescription medicines for pain, discomfort, or fever only as directed by your caregiver. SEEK IMMEDIATE MEDICAL CARE IF:  You have increasing  pain or severe headaches.  You have nausea, vomiting, or drowsiness.  You have swelling around your face.  You have vision problems.  You have a stiff neck.  You have difficulty breathing. MAKE SURE YOU:   Understand these instructions.  Will watch your condition.  Will get help right away if you are not doing well or get worse. Document Released: 02/10/2005 Document Revised: 05/05/2011 Document Reviewed: 02/25/2011 [facility] Patient Information 2014 [place], [place].

## 2015-08-15 NOTE — Progress Notes (Signed)
Patient presents to clinic today c/o 10 days of sinus pressure, sinus pain, facial pain, scratchy throat and cough that is dry. Endorses symptoms have gradually worsened since onset. Endorses fatigue due to the illness. Denies chest pain, SOB. Denies fever but states she has not checked temperature. Has tried multiple OTC medication with little relief in symptoms.   Past Medical History  Diagnosis Date  . Dysphonia   . Asthmatic bronchitis   . Chest pain, atypical   . Cerebrovascular disease   . Hypercholesteremia   . GERD (gastroesophageal reflux disease)   . Acute cystitis   . Esophageal stricture   . Gastroparesis   . Diverticulosis of colon   . IBS (irritable bowel syndrome)   . Hemorrhoids   . Urinary incontinence   . Hematuria, microscopic   . DJD (degenerative joint disease)   . Fibromyalgia   . Anxiety   . Vitamin D deficiency   . RLS (restless legs syndrome)   . Aphasia     transsient aphasia Jan 2012  . Memory loss   . Memory deficits 02/15/2013  . TIA (transient ischemic attack)     Current Outpatient Prescriptions on File Prior to Visit  Medication Sig Dispense Refill  . ALPRAZolam (XANAX) 0.5 MG tablet TAKE  1/2 -1 TABLET BY MOUTH THREE TIMES DAILY AS NEEDED (Patient taking differently: Take 0.5 mg by mouth 3 (three) times daily as needed for anxiety. TAKE  1/2 -1 TABLET BY MOUTH THREE TIMES DAILY AS NEEDED) 90 tablet 2  . aspirin (ECOTRIN) 325 MG EC tablet Take 325 mg by mouth daily.      . DULoxetine (CYMBALTA) 20 MG capsule TAKE ONE CAPSULE BY MOUTH DAILY 30 capsule 6  . ezetimibe (ZETIA) 10 MG tablet Take 1 tablet (10 mg total) by mouth daily. 30 tablet 6  . fluticasone (FLONASE) 50 MCG/ACT nasal spray Place 2 sprays into both nostrils daily. (Patient taking differently: Place 2 sprays into both nostrils daily as needed for allergies. ) 16 g 2  . Iron-Vitamins (GERITOL) LIQD Take 15 mLs by mouth daily.    Marland Kitchen KRILL OIL OMEGA-3 PO Take 1 capsule by mouth  daily.    Marland Kitchen omeprazole (PRILOSEC) 40 MG capsule TAKE 1 CAPSULE BY MOUTH DAILY 90 capsule 0  . prednisoLONE acetate (PRED FORTE) 1 % ophthalmic suspension Place 1 drop into both eyes daily.     . ranitidine (ZANTAC) 300 MG tablet Take 1 tablet (300 mg total) by mouth at bedtime. 30 tablet 1  . rosuvastatin (CRESTOR) 5 MG tablet Take 1 tablet by mouth three days each week. (Patient taking differently: Take 5 mg by mouth 2 (two) times a week. Tuesday and Saturday) 50 tablet 1  . sucralfate (CARAFATE) 1 GM/10ML suspension Take 10 mLs (1 g total) by mouth 4 (four) times daily -  with meals and at bedtime. 300 mL 1  . tolterodine (DETROL LA) 4 MG 24 hr capsule Take 1 capsule (4 mg total) by mouth at bedtime. 90 capsule 1   No current facility-administered medications on file prior to visit.    Allergies  Allergen Reactions  . Morphine Other (See Comments)    REACTION: lethargy/malaise  . Metoclopramide Hcl Anxiety and Other (See Comments)    REACTION: anxious and out of her mind  . Penicillins Itching and Rash    Has patient had a PCN reaction causing immediate rash, facial/tongue/throat swelling, SOB or lightheadedness with hypotension: No Has patient had a PCN reaction causing  severe rash involving mucus membranes or skin necrosis: No Has patient had a PCN reaction that required hospitalization No Has patient had a PCN reaction occurring within the last 10 years: Yes If all of the above answers are "NO", then may proceed with Cephalosporin use.    . Prednisone Itching and Rash  . Sulfonamide Derivatives Itching and Rash    Family History  Problem Relation Age of Onset  . Breast cancer      aunt  . Diabetes      aunt,brother,uncle  . Kidney disease      father  . Hyperlipidemia Sister   . Diabetes Brother   . Hypertension Mother   . Anemia Mother   . Kidney disease Father   . Dementia Maternal Grandmother     Social History   Social History  . Marital Status: Married     Spouse Name: Mallie Mussel  . Number of Children: 3  . Years of Education: college-2   Occupational History  .     Social History Main Topics  . Smoking status: Never Smoker   . Smokeless tobacco: Never Used  . Alcohol Use: No  . Drug Use: No  . Sexual Activity: Not Asked   Other Topics Concern  . None   Social History Narrative   Lennie Hummer   [place], daughter   Giavonni Meiss info release form to share her medical records with her children   Review of Systems - See HPI.  All other ROS are negative.  BP 120/86 mmHg  Pulse 78  Temp(Src) 98.6 F (37 C) (Oral)  Resp 16  Ht 5\' 1"  (1.549 m)  Wt 133 lb 2 oz (60.385 kg)  BMI 25.17 kg/m2  SpO2 99%  Physical Exam  Constitutional: She is oriented to person, place, and time and well-developed, well-nourished, and in no distress.  HENT:  Head: Normocephalic and atraumatic.  Right Ear: Tympanic membrane and external ear normal.  Left Ear: Tympanic membrane and external ear normal.  Nose: Mucosal edema and rhinorrhea present. Left sinus exhibits maxillary sinus tenderness.  Mouth/Throat: Uvula is midline, oropharynx is clear and moist and mucous membranes are normal.  Eyes: Conjunctivae are normal.  Neck: Neck supple.  Cardiovascular: Normal rate, regular rhythm, normal heart sounds and intact distal pulses.   Pulmonary/Chest: Effort normal and breath sounds normal. No respiratory distress. She has no wheezes. She has no rales. She exhibits no tenderness.  Neurological: She is alert and oriented to person, place, and time.  Skin: Skin is warm and dry. No rash noted.  Psychiatric: Affect normal.  Vitals reviewed.   Recent Results (from the past 2160 hour(s))  Lipid Profile     Status: None   Collection Time: 07/04/15  3:13 PM  Result Value Ref Range   Cholesterol 187 125 - 200 mg/dL   Triglycerides 57 <150 mg/dL   HDL 72 >=46 mg/dL   Total CHOL/HDL Ratio 2.6 <=5.0 Ratio   VLDL 11 <30 mg/dL    LDL Cholesterol 104 <130 mg/dL    Comment:   Total Cholesterol/HDL Ratio:CHD Risk                        Coronary Heart Disease Risk Table                                        Men  Women          1/2 Average Risk              3.4        3.3              Average Risk              5.0        4.4           2X Average Risk              9.6        7.1           3X Average Risk             23.4       11.0 Use the calculated Patient Ratio above and the CHD Risk table  to determine the patient's CHD Risk.   Hepatic function panel     Status: None   Collection Time: 07/04/15  3:13 PM  Result Value Ref Range   Total Bilirubin 0.5 0.2 - 1.2 mg/dL   Bilirubin, Direct 0.1 <=0.2 mg/dL   Indirect Bilirubin 0.4 0.2 - 1.2 mg/dL   Alkaline Phosphatase 89 33 - 130 U/L   AST 26 10 - 35 U/L   ALT 15 6 - 29 U/L   Total Protein 6.8 6.1 - 8.1 g/dL   Albumin 4.0 3.6 - 5.1 g/dL    Assessment/Plan: 1. Acute bacterial sinusitis Rx Doxycycline.  Increase fluids.  Rest.  Saline nasal spray.  Probiotic.  Mucinex as directed.  Humidifier in bedroom. OTC Delsym for cough.  Call or return to clinic if symptoms are not improving.  - doxycycline (VIBRAMYCIN) 100 MG capsule; Take 1 capsule (100 mg total) by mouth 2 (two) times daily.  Dispense: 20 capsule; Refill: 0  Leeanne Rio, [place]

## 2015-08-17 ENCOUNTER — Encounter: Payer: Self-pay | Admitting: Family Medicine

## 2015-08-20 ENCOUNTER — Other Ambulatory Visit: Payer: Self-pay | Admitting: Cardiovascular Disease

## 2015-08-21 ENCOUNTER — Telehealth: Payer: Self-pay | Admitting: Family Medicine

## 2015-08-21 MED ORDER — LEVOFLOXACIN 500 MG PO TABS: 500.0000 mg | ORAL_TABLET | Freq: Every day | ORAL | Status: DC

## 2015-08-21 NOTE — Telephone Encounter (Signed)
Looks like patients lipids are being followed by pcp, Dr [name]. Ok to refill or defer to pcp office? Please advise. Thanks, MI

## 2015-08-21 NOTE — Telephone Encounter (Signed)
D/c doxy. Start levaquin. What was reaction to doxy?

## 2015-08-21 NOTE — Telephone Encounter (Signed)
Pt called in because she says that provider prescribed her doxycycline and she is unable to take it. Pt says that she would like to know if provider could call her something else in to the pharmacy?    Pharmacy: [id number] - [place], [address]

## 2015-08-21 NOTE — Telephone Encounter (Signed)
Notified pt. Doxycycline caused diarrhea and has been added to her intolerances.

## 2015-08-22 NOTE — Telephone Encounter (Deleted)
Refer to PCP

## 2015-10-03 ENCOUNTER — Ambulatory Visit (INDEPENDENT_AMBULATORY_CARE_PROVIDER_SITE_OTHER): Payer: Medicare Other | Admitting: Neurology

## 2015-10-03 ENCOUNTER — Encounter: Payer: Self-pay | Admitting: Neurology

## 2015-10-03 ENCOUNTER — Telehealth: Payer: Self-pay

## 2015-10-03 VITALS — BP 142/83 | HR 79 | Ht 61.0 in | Wt 133.0 lb

## 2015-10-03 DIAGNOSIS — R413 Other amnesia: Secondary | ICD-10-CM | POA: Diagnosis not present

## 2015-10-03 MED ORDER — DONEPEZIL HCL 5 MG PO TABS: 5.0000 mg | ORAL_TABLET | Freq: Every day | ORAL | 1 refills | Status: DC

## 2015-10-03 NOTE — Telephone Encounter (Signed)
Error

## 2015-10-03 NOTE — Telephone Encounter (Addendum)
Arrived after scheduled appt time.

## 2015-10-03 NOTE — Patient Instructions (Signed)
   Begin Aricept (donepezil) at 5 mg at night for one month. If this medication is well-tolerated, please call our office and we will call in a prescription for the 10 mg tablets. Look out for side effects that may include nausea, diarrhea, weight loss, or stomach cramps. This medication will also cause a runny nose, therefore there is no need for allergy medications for this purpose.  

## 2015-10-03 NOTE — Progress Notes (Signed)
Reason for visit: Memory disturbance  Paige Pena is an 75 y.o. female  History of present illness:  Paige Pena is a 75 year old right-handed black female with a history of a mild memory disturbance. The patient has noted very minimal changes in her memory since last seen. She notes some mild forgetfulness, she still performs all of her own activities of daily living. She drives a motor vehicle without difficulty, she is able to keep up with finances, medications, and appointments. The patient was in the hospital in March 2017 with transient arm weakness. Once again a workup was negative for stroke. The patient was found to have significant spondylosis in the cervical spine. Within the last 2 weeks she has developed some left sided shoulder and neck discomfort, and left-sided headaches. This has since resolved. The patient is on low-dose aspirin.  Past Medical History:  Diagnosis Date  . Acute cystitis   . Anxiety   . Aphasia    transsient aphasia Jan 2012  . Asthmatic bronchitis   . Cerebrovascular disease   . Chest pain, atypical   . Diverticulosis of colon   . DJD (degenerative joint disease)   . Dysphonia   . Esophageal stricture   . Fibromyalgia   . Gastroparesis   . GERD (gastroesophageal reflux disease)   . Hematuria, microscopic   . Hemorrhoids   . Hypercholesteremia   . IBS (irritable bowel syndrome)   . Memory deficits 02/15/2013  . Memory loss   . RLS (restless legs syndrome)   . TIA (transient ischemic attack)   . Urinary incontinence   . Vitamin D deficiency     Past Surgical History:  Procedure Laterality Date  . ABDOMINAL HYSTERECTOMY  1995  . corneal transplant for keratonconus    . EYE SURGERY  2013  . left foot fracture with ORIF  07/2004   Dr. Sharol Given  . right bunion surgery      Family History  Problem Relation Age of Onset  . Hyperlipidemia Sister   . Diabetes Brother   . Hypertension Mother   . Anemia Mother   . Dementia Mother   .  Kidney disease Father   . Breast cancer      aunt  . Diabetes      aunt,brother,uncle  . Kidney disease      father  . Dementia Maternal Grandmother     Social history:  reports that she has never smoked. She has never used smokeless tobacco. She reports that she does not drink alcohol or use drugs.    Allergies  Allergen Reactions  . Morphine Other (See Comments)    REACTION: lethargy/malaise  . Metoclopramide Hcl Anxiety and Other (See Comments)    REACTION: anxious and out of her mind  . Doxycycline Diarrhea  . Penicillins Itching and Rash    Has patient had a PCN reaction causing immediate rash, facial/tongue/throat swelling, SOB or lightheadedness with hypotension: No Has patient had a PCN reaction causing severe rash involving mucus membranes or skin necrosis: No Has patient had a PCN reaction that required hospitalization No Has patient had a PCN reaction occurring within the last 10 years: Yes If all of the above answers are "NO", then may proceed with Cephalosporin use.    . Prednisone Itching and Rash  . Sulfonamide Derivatives Itching and Rash    Medications:  Prior to Admission medications   Medication Sig Start Date End Date Taking? Authorizing Provider  ALPRAZolam Duanne Moron) 0.5 MG tablet TAKE  1/2 -1 TABLET BY MOUTH THREE TIMES DAILY AS NEEDED Patient taking differently: Take 0.5 mg by mouth 3 (three) times daily as needed for anxiety. TAKE  1/2 -1 TABLET BY MOUTH THREE TIMES DAILY AS NEEDED 05/23/13   [name], MD  aspirin (ECOTRIN) 325 MG EC tablet Take 325 mg by mouth daily.      Historical Provider, MD  DULoxetine (CYMBALTA) 20 MG capsule TAKE ONE CAPSULE BY MOUTH DAILY 08/13/15   [name], MD  ezetimibe (ZETIA) 10 MG tablet Take 1 tablet (10 mg total) by mouth daily. 07/10/15   [name], MD  fluticasone (FLONASE) 50 MCG/ACT nasal spray Place 2 sprays into both nostrils daily. Patient taking differently: Place 2 sprays into both nostrils  daily as needed for allergies.  10/05/14   [name], MD  Iron-Vitamins (GERITOL) LIQD Take 15 mLs by mouth daily.    Historical Provider, MD  KRILL OIL OMEGA-3 PO Take 1 capsule by mouth daily.    Historical Provider, MD  levofloxacin (LEVAQUIN) 500 MG tablet Take 1 tablet (500 mg total) by mouth daily. 08/21/15   Debbrah Alar, NP  omeprazole (PRILOSEC) 40 MG capsule TAKE 1 CAPSULE BY MOUTH DAILY 08/02/15   [name], MD  prednisoLONE acetate (PRED FORTE) 1 % ophthalmic suspension Place 1 drop into both eyes daily.  12/22/12   Historical Provider, MD  ranitidine (ZANTAC) 300 MG tablet Take 1 tablet (300 mg total) by mouth at bedtime. 06/30/15   Abner Greenspan, MD  rosuvastatin (CRESTOR) 5 MG tablet Take 1 tablet by mouth three days each week. Patient taking differently: Take 5 mg by mouth 2 (two) times a week. Tuesday and Saturday 06/20/14   Josue Hector, MD  sucralfate (CARAFATE) 1 GM/10ML suspension Take 10 mLs (1 g total) by mouth 4 (four) times daily -  with meals and at bedtime. 06/30/15   Abner Greenspan, MD  tolterodine (DETROL LA) 4 MG 24 hr capsule Take 1 capsule (4 mg total) by mouth at bedtime. 10/05/14   [name], MD    ROS:  Out of a complete 14 system review of symptoms, the patient complains only of the following symptoms, and all other reviewed systems are negative.  Difficulty swallowing Wheezing, chest tightness Palpitations of the heart Swollen abdomen, black stools, constipation, incontinence of bowels Incontinence bladder, frequency of urination Neck pain Anxiety Bruising easily  Blood pressure (!) 142/83, pulse 79, height 5\' 1"  (1.549 m), weight 133 lb (60.3 kg).  Physical Exam  General: The patient is alert and cooperative at the time of the examination.  Skin: No significant peripheral edema is noted.   Neurologic Exam  Mental status: The patient is alert and oriented x 3 at the time of the examination. The patient has apparent  normal recent and remote memory, with an apparently normal attention span and concentration ability. Mini-Mental Status Examination done today shows a total score 29/30.   Cranial nerves: Facial symmetry is present. Speech is normal, no aphasia or dysarthria is noted. Extraocular movements are full. Visual fields are full.  Motor: The patient has good strength in all 4 extremities.  Sensory examination: Soft touch sensation is symmetric on the face, arms, and legs.  Coordination: The patient has good finger-nose-finger and heel-to-shin bilaterally.  Gait and station: The patient has a normal gait. Tandem gait is normal. Romberg is negative. No drift is seen.  Reflexes: Deep tendon reflexes are symmetric.   MRI cervical  05/08/15:  IMPRESSION: Cervical spondylotic changes most prominent C3-4, C5-6 and C6-7. Summary pertinent findings includes:  C3-4 broad-based disc osteophyte complex greater to left. Posterior element hypertrophy. Mild to moderate left sided and mild right-sided spinal stenosis. Cord flattening greater on left. Moderate left foraminal narrowing.  C4-5 bulge.  Mild narrowing ventral thecal sac.  C5-6 broad-based disc osteophyte complex. Moderate spinal stenosis with mild cord flattening. Moderate to marked left-sided and mild right-sided foraminal narrowing.  C6-7 broad-based disc osteophyte complex greater to the right. Spinal stenosis and cord flattening greater on the right. Moderate right-sided and mild left-sided foraminal narrowing.  T1-2 moderate-to-marked foraminal narrowing greater on the left. Narrowing ventral thecal sac without cord compression.  T2-3 and T3-4 mild narrowing ventral thecal sac and mild bilateral foraminal narrowing.   MRI brain 05/08/15:  IMPRESSION: MRI HEAD  Exam is motion degraded.  No acute infarct or intracranial hemorrhage.  Remote small cerebellar infarcts.  Mild chronic small vessel disease  changes.  Mild global atrophy without hydrocephalus.  No intracranial mass or abnormal enhancement.  Cervical spondylotic changes with spinal stenosis and cord flattening (C3-4 thru C5-6) incompletely assessed on present exam. Cervical spine MR can be obtained further delineation if clinically desired.  Mild cerebellar tonsillar ectopia. Slight impression upon the ventral cervical medullary junction by the transverse ligament. Findings without change since 2012 MR.  MRA HEAD  Ectatic left internal carotid artery extending to level of left carotid terminus. Mild ectasia right internal carotid artery.  Aplastic A1 segment right anterior cerebral artery.  Mild narrowing and irregularity middle cerebral artery branch vessels and A2 segment anterior cerebral artery (greater on the right).  Left vertebral artery is dominant. Right vertebral artery is small and narrowed after takeoff of the right posterior inferior cerebellar artery.  Mild irregularity and narrowing posterior inferior cerebellar artery bilaterally.  Ectatic basilar artery with mild narrowing and irregularity.  Nonvisualized left anterior inferior cerebellar artery.  Fetal origin of the right posterior cerebral artery.  Mild ectasia proximal left posterior cerebral artery without focal aneurysm.  Posterior cerebral artery distal branch vessel mild narrowing and irregularity bilaterally.  MRI NECK  No evidence of hemodynamically significant stenosis involving either carotid bifurcation.  Left vertebral artery is dominant.  * MRI scan images were reviewed online. I agree with the written report.    EEG 05/09/15:  Impression: This is a normal routine EEG of the awake and asleep states, without activating procedures.  A normal study does not rule out the possibility of a seizure disorder in this patient.    Assessment/Plan:  1. Mild memory disturbance  2. Cervical  spondylosis  The patient will be given a trial on low-dose Aricept. If she cannot tolerate it, she is contact our office. Otherwise, she will follow-up in 6 months, sooner if needed.  Jill Alexanders MD 10/03/2015 1:37 PM  [facility] [address] [place], [place] [address]  Phone [phone] Fax [phone]

## 2015-10-09 ENCOUNTER — Other Ambulatory Visit: Payer: Self-pay

## 2015-10-10 ENCOUNTER — Encounter: Payer: Self-pay | Admitting: Family Medicine

## 2015-10-11 ENCOUNTER — Telehealth: Payer: Self-pay | Admitting: Neurology

## 2015-10-11 NOTE — Telephone Encounter (Signed)
Returned call and spoke to pt. Says that she tried low dose donepezil for a week. She's had an upset stomach and bad dreams while taking. Would like to stop the medication and speak to Dr. Jannifer Franklin about memory/cognition.

## 2015-10-11 NOTE — Telephone Encounter (Signed)
Patient called requesting to speak to Dr. Jannifer Franklin regarding medication donepezil (ARICEPT) 5 MG tablet.

## 2015-10-11 NOTE — Telephone Encounter (Signed)
I called the patient. The patient is having side effects on Aricept, she will need to stop the medication. I discussed the possibility of going on Namenda, she is not clear she wants to start another medication at this time. She will call our office if she desires to get on Namenda.

## 2015-10-11 NOTE — Addendum Note (Signed)
Addended by: Margette Fast on: 10/11/2015 06:58 PM   Modules accepted: Orders

## 2015-11-03 ENCOUNTER — Other Ambulatory Visit: Payer: Self-pay | Admitting: Family Medicine

## 2015-11-16 ENCOUNTER — Other Ambulatory Visit: Payer: Self-pay | Admitting: Family Medicine

## 2015-12-05 LAB — HM MAMMOGRAPHY: HM Mammogram: NORMAL (ref 0–4)

## 2016-01-04 ENCOUNTER — Encounter: Payer: Self-pay | Admitting: Family Medicine

## 2016-01-04 ENCOUNTER — Ambulatory Visit (INDEPENDENT_AMBULATORY_CARE_PROVIDER_SITE_OTHER): Payer: Medicare Other | Admitting: Family Medicine

## 2016-01-04 ENCOUNTER — Telehealth: Payer: Self-pay | Admitting: Gastroenterology

## 2016-01-04 VITALS — BP 121/81 | HR 83 | Temp 98.3°F | Resp 16 | Ht 61.0 in | Wt 127.5 lb

## 2016-01-04 DIAGNOSIS — R1013 Epigastric pain: Secondary | ICD-10-CM

## 2016-01-04 DIAGNOSIS — K3184 Gastroparesis: Secondary | ICD-10-CM | POA: Diagnosis not present

## 2016-01-04 DIAGNOSIS — K219 Gastro-esophageal reflux disease without esophagitis: Secondary | ICD-10-CM | POA: Diagnosis not present

## 2016-01-04 LAB — CBC WITH DIFFERENTIAL/PLATELET
BASOS ABS: 0 10*3/uL (ref 0.0–0.1)
BASOS PCT: 0.6 % (ref 0.0–3.0)
EOS ABS: 0 10*3/uL (ref 0.0–0.7)
Eosinophils Relative: 1.1 % (ref 0.0–5.0)
HEMATOCRIT: 41.2 % (ref 36.0–46.0)
HEMOGLOBIN: 13.8 g/dL (ref 12.0–15.0)
LYMPHS PCT: 30.3 % (ref 12.0–46.0)
Lymphs Abs: 1.3 10*3/uL (ref 0.7–4.0)
MCHC: 33.5 g/dL (ref 30.0–36.0)
MCV: 88 fl (ref 78.0–100.0)
Monocytes Absolute: 0.3 10*3/uL (ref 0.1–1.0)
Monocytes Relative: 6.1 % (ref 3.0–12.0)
Neutro Abs: 2.6 10*3/uL (ref 1.4–7.7)
Neutrophils Relative %: 61.9 % (ref 43.0–77.0)
Platelets: 175 10*3/uL (ref 150.0–400.0)
RBC: 4.68 Mil/uL (ref 3.87–5.11)
RDW: 13.6 % (ref 11.5–15.5)
WBC: 4.2 10*3/uL (ref 4.0–10.5)

## 2016-01-04 LAB — HEPATIC FUNCTION PANEL
ALT: 11 U/L (ref 0–35)
AST: 15 U/L (ref 0–37)
Albumin: 4.1 g/dL (ref 3.5–5.2)
Alkaline Phosphatase: 93 U/L (ref 39–117)
BILIRUBIN TOTAL: 0.5 mg/dL (ref 0.2–1.2)
Bilirubin, Direct: 0.1 mg/dL (ref 0.0–0.3)
TOTAL PROTEIN: 7 g/dL (ref 6.0–8.3)

## 2016-01-04 LAB — BASIC METABOLIC PANEL
BUN: 15 mg/dL (ref 6–23)
CHLORIDE: 105 meq/L (ref 96–112)
CO2: 28 mEq/L (ref 19–32)
CREATININE: 0.94 mg/dL (ref 0.40–1.20)
Calcium: 9.4 mg/dL (ref 8.4–10.5)
GFR: 74.34 mL/min (ref >60.00)
Glucose, Bld: 100 mg/dL — ABNORMAL HIGH (ref 70–99)
Potassium: 3.7 mEq/L (ref 3.5–5.1)
Sodium: 142 mEq/L (ref 135–145)

## 2016-01-04 LAB — H. PYLORI ANTIBODY, IGG: H Pylori IgG: NEGATIVE

## 2016-01-04 MED ORDER — SUCRALFATE 1 G PO TABS: 1.0000 g | ORAL_TABLET | Freq: Three times a day (TID) | ORAL | 0 refills | Status: DC

## 2016-01-04 MED ORDER — ONDANSETRON HCL 4 MG PO TABS: 4.0000 mg | ORAL_TABLET | Freq: Three times a day (TID) | ORAL | 0 refills | Status: DC | PRN

## 2016-01-04 NOTE — Assessment & Plan Note (Signed)
Ongoing issue.  I suspect this is the root of her current problem and she has seen multiple GI specialists w/o resolution or improvement in sxs.  Asking for referral to local GI since she has recently been seeing [facility].  Referral placed.  Will follow along and assist as able.

## 2016-01-04 NOTE — Progress Notes (Signed)
Pre visit review using our clinic review tool, if applicable. No additional management support is needed unless otherwise documented below in the visit note. 

## 2016-01-04 NOTE — Progress Notes (Signed)
   Subjective:    Patient ID: Paige Pena, female    DOB: 1940/04/15, 75 y.o.   MRN: [id number]  HPI abd pain- sxs started Sunday night after attending a [employer] banquet.  'my chest just felt like it was going to explode'.  Got relief w/ belching and Gas-X.  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  'All week I was belching- but it was getting less'.  Yesterday had nausea and vomited 'a couple of times'- it was clear.  Vomited again this AM.  Pt has seen Dr Oletta Lamas, went to [facility], called [facility] yesterday but never got a return call.  Initially pt was seeing Dr Fuller Plan.  Pt has not been eating much this week.  Pt has not been taking her Omeprazole this week.  Having epigastric pain.   Review of Systems For ROS see HPI     Objective:   Physical Exam  Constitutional: She is oriented to person, place, and time. She appears well-developed and well-nourished. No distress.  HENT:  Head: Normocephalic and atraumatic.  Pulmonary/Chest: Effort normal and breath sounds normal.  Abdominal: Soft. Bowel sounds are normal. She exhibits distension (mild distension throughout). She exhibits no mass. There is tenderness (mild TTP over epigastrum). There is no rebound and no guarding.  Neurological: She is alert and oriented to person, place, and time.  Skin: Skin is warm and dry.  Psychiatric: She has a normal mood and affect. Her behavior is normal. Thought content normal.  Vitals reviewed.         Assessment & Plan:

## 2016-01-04 NOTE — Assessment & Plan Note (Signed)
Ongoing issue for pt.  sxs worsened on Sunday after eating banquet food when she developed worsening indigestion.  She stopped her PPI for some reason, which likely worsened her acid production, pain, and subsequently caused N/V starting yesterday.  Pt is in no distress in office and other than burping frequently, her abdominal exam was benign- w/ only mild TTP due to distension.  Check labs to r/o H pylori, biliary abnormality.  Start Zofran for nausea.  Restart Carafate and PPI.  GI referral.  Reviewed supportive care and red flags that should prompt return.  Pt expressed understanding and is in agreement w/ plan.

## 2016-01-04 NOTE — Assessment & Plan Note (Signed)
Deteriorated.  Pt stopped PPI recently w/o cause.  Stressed need for her to take medication regularly.  Refer to GI for ongoing management.

## 2016-01-04 NOTE — Telephone Encounter (Signed)
Patient's daughter wants her to return to Dr. Fuller Plan.  She is seeing [facility] and has seen them last year. Daughter notified that if Mom wants to transfer care we will need her records reviewed.  Daughter will get records.

## 2016-01-04 NOTE — Patient Instructions (Signed)
Follow up as needed We'll notify you of your lab results and make any changes if needed Restart the Omeprazole daily to decrease acid production Take the Carafate prior to meals and before bed to protect the stomach lining Use the Zofran as needed for nausea We'll call you with your GI appt Drink plenty of fluids Call with any questions or concerns Hang in there!!!

## 2016-01-14 ENCOUNTER — Other Ambulatory Visit: Payer: Self-pay | Admitting: Family Medicine

## 2016-02-26 ENCOUNTER — Other Ambulatory Visit: Payer: Self-pay | Admitting: Family Medicine

## 2016-04-07 ENCOUNTER — Ambulatory Visit: Payer: Medicare Other | Admitting: Adult Health

## 2016-05-02 ENCOUNTER — Other Ambulatory Visit: Payer: Self-pay | Admitting: Family Medicine

## 2016-05-22 ENCOUNTER — Ambulatory Visit (INDEPENDENT_AMBULATORY_CARE_PROVIDER_SITE_OTHER): Payer: Medicare Other | Admitting: Adult Health

## 2016-05-22 ENCOUNTER — Encounter: Payer: Self-pay | Admitting: Adult Health

## 2016-05-22 ENCOUNTER — Encounter (INDEPENDENT_AMBULATORY_CARE_PROVIDER_SITE_OTHER): Payer: Self-pay

## 2016-05-22 VITALS — BP 113/66 | HR 92 | Wt 124.6 lb

## 2016-05-22 DIAGNOSIS — R413 Other amnesia: Secondary | ICD-10-CM

## 2016-05-22 NOTE — Progress Notes (Signed)
I have read the note, and I agree with the clinical assessment and plan.  Paige Pena   

## 2016-05-22 NOTE — Patient Instructions (Signed)
Memory score today is 27/30 and previously 29/30 Consider Namenda for memory If your symptoms worsen or you develop new symptoms please let us know.   Memantine Tablets What is this medicine? MEMANTINE (MEM an teen) is used to treat dementia caused by Alzheimer's disease. This medicine may be used for other purposes; ask your health care provider or pharmacist if you have questions. COMMON BRAND NAME(S): Namenda What should I tell my health care provider before I take this medicine? They need to know if you have any of these conditions: -difficulty passing urine -kidney disease -liver disease -seizures -an unusual or allergic reaction to memantine, other medicines, foods, dyes, or preservatives -pregnant or trying to get pregnant -breast-feeding How should I use this medicine? Take this medicine by mouth with a glass of water. Follow the directions on the prescription label. You may take this medicine with or without food. Take your doses at regular intervals. Do not take your medicine more often than directed. Continue to take your medicine even if you feel better. Do not stop taking except on the advice of your doctor or health care professional. Talk to your pediatrician regarding the use of this medicine in children. Special care may be needed. Overdosage: If you think you have taken too much of this medicine contact a poison control center or emergency room at once. NOTE: This medicine is only for you. Do not share this medicine with others. What if I miss a dose? If you miss a dose, take it as soon as you can. If it is almost time for your next dose, take only that dose. Do not take double or extra doses. If you do not take your medicine for several days, contact your health care provider. Your dose may need to be changed. What may interact with this  medicine? -acetazolamide -amantadine -cimetidine -dextromethorphan -dofetilide -hydrochlorothiazide -ketamine -metformin -methazolamide -quinidine -ranitidine -sodium bicarbonate -triamterene This list may not describe all possible interactions. Give your health care provider a list of all the medicines, herbs, non-prescription drugs, or dietary supplements you use. Also tell them if you smoke, drink alcohol, or use illegal drugs. Some items may interact with your medicine. What should I watch for while using this medicine? Visit your doctor or health care professional for regular checks on your progress. Check with your doctor or health care professional if there is no improvement in your symptoms or if they get worse. You may get drowsy or dizzy. Do not drive, use machinery, or do anything that needs mental alertness until you know how this drug affects you. Do not stand or sit up quickly, especially if you are an older patient. This reduces the risk of dizzy or fainting spells. Alcohol can make you more drowsy and dizzy. Avoid alcoholic drinks. What side effects may I notice from receiving this medicine? Side effects that you should report to your doctor or health care professional as soon as possible: -allergic reactions like skin rash, itching or hives, swelling of the face, lips, or tongue -agitation or a feeling of restlessness -depressed mood -dizziness -hallucinations -redness, blistering, peeling or loosening of the skin, including inside the mouth -seizures -vomiting Side effects that usually do not require medical attention (report to your doctor or health care professional if they continue or are bothersome): -constipation -diarrhea -headache -nausea -trouble sleeping This list may not describe all possible side effects. Call your doctor for medical advice about side effects. You may report side effects to FDA at 1-800-FDA-1088. Where should  I keep my medicine? Keep  out of the reach of children. Store at room temperature between 15 degrees and 30 degrees C (59 degrees and 86 degrees F). Throw away any unused medicine after the expiration date. NOTE: This sheet is a summary. It may not cover all possible information. If you have questions about this medicine, talk to your doctor, pharmacist, or health care provider.  2018 Elsevier/Gold Standard (2012-11-29 14:10:42)

## 2016-05-22 NOTE — Progress Notes (Signed)
PATIENT: Paige Pena DOB: 23-Sep-1940  REASON FOR VISIT: follow up- memory HISTORY FROM: patient  HISTORY OF PRESENT ILLNESS: [name] is a 76 year old female with a history of mild memory disturbance. She returns today for follow-up. At the last visit she was started on Aricept however she was unable to tolerate this medication. She feels that her memory has remained stable. She lives at home with her spouse. She is able to complete all ADLs independently. She operates a Teacher, music without difficulty. Denies any trouble cooking. She manages her finances independently. She denies having to give up any hobbies due to her memory. She oversees her own medications and appointments without difficulty.sleeping well with melatonin. Overall she feels that she is doing well. She returns today for follow-up.  HISTORY (WILLIS): [name] is a 76 year old right-handed black female with a history of a mild memory disturbance. The patient has noted very minimal changes in her memory since last seen. She notes some mild forgetfulness, she still performs all of her own activities of daily living. She drives a motor vehicle without difficulty, she is able to keep up with finances, medications, and appointments. The patient was in the hospital in March 2017 with transient arm weakness. Once again a workup was negative for stroke. The patient was found to have significant spondylosis in the cervical spine. Within the last 2 weeks she has developed some left sided shoulder and neck discomfort, and left-sided headaches. This has since resolved. The patient is on low-dose aspirin.  REVIEW OF SYSTEMS: Out of a complete 14 system review of symptoms, the patient complains only of the following symptoms, and all other reviewed systems are negative.  Cough, wheezing, shortness of breath, daytime sleepiness, walking difficulty, nervous/anxious, fatigue, trouble swallowing  ALLERGIES: Allergies  Allergen Reactions    . Morphine Other (See Comments)    REACTION: lethargy/malaise  . Metoclopramide Hcl Anxiety and Other (See Comments)    REACTION: anxious and out of her mind  . Aricept [Donepezil Hcl]     nausea  . Doxycycline Diarrhea  . Penicillins Itching and Rash    .    Marland Kitchen Prednisone Itching and Rash  . Sulfonamide Derivatives Itching and Rash    HOME MEDICATIONS: Outpatient Medications Prior to Visit  Medication Sig Dispense Refill  . ALPRAZolam (XANAX) 0.5 MG tablet TAKE  1/2 -1 TABLET BY MOUTH THREE TIMES DAILY AS NEEDED (Patient taking differently: Take 0.5 mg by mouth 3 (three) times daily as needed for anxiety. TAKE  1/2 -1 TABLET BY MOUTH THREE TIMES DAILY AS NEEDED) 90 tablet 2  . aspirin (ECOTRIN) 325 MG EC tablet Take 325 mg by mouth daily.      . Cholecalciferol (D3 ADULT PO) Place 5,000 Units under the tongue.     . DULoxetine (CYMBALTA) 20 MG capsule TAKE ONE CAPSULE BY MOUTH DAILY 30 capsule 0  . ezetimibe (ZETIA) 10 MG tablet Take 1 tablet (10 mg total) by mouth daily. 30 tablet 6  . fluticasone (FLONASE) 50 MCG/ACT nasal spray Place 2 sprays into both nostrils daily. (Patient taking differently: Place 2 sprays into both nostrils daily as needed for allergies. ) 16 g 2  . Iron-Vitamins (GERITOL) LIQD Take 15 mLs by mouth daily.    Marland Kitchen KRILL OIL OMEGA-3 PO Take 1 capsule by mouth daily.    . Melatonin 5 MG CAPS Take by mouth at bedtime.    . Multiple Vitamins-Minerals (HAIR SKIN AND NAILS FORMULA PO) Take by mouth daily.    Marland Kitchen  omeprazole (PRILOSEC) 40 MG capsule TAKE 1 CAPSULE BY MOUTH DAILY 90 capsule 0  . ondansetron (ZOFRAN) 4 MG tablet Take 1 tablet (4 mg total) by mouth every 8 (eight) hours as needed for nausea or vomiting. 30 tablet 0  . prednisoLONE acetate (PRED FORTE) 1 % ophthalmic suspension Place 1 drop into both eyes daily.     . sucralfate (CARAFATE) 1 g tablet Take 1 tablet (1 g total) by mouth 4 (four) times daily -  with meals and at bedtime. 90 tablet 0  .  tolterodine (DETROL LA) 4 MG 24 hr capsule TAKE 1 CAPSULE(4 MG) BY MOUTH AT BEDTIME 90 capsule 0   No facility-administered medications prior to visit.     PAST MEDICAL HISTORY: Past Medical History:  Diagnosis Date  . Acute cystitis   . Anxiety   . Aphasia    transsient aphasia Jan 2012  . Asthmatic bronchitis   . Cerebrovascular disease   . Chest pain, atypical   . Diverticulosis of colon   . DJD (degenerative joint disease)   . Dysphonia   . Esophageal stricture   . Fibromyalgia   . Gastroparesis   . GERD (gastroesophageal reflux disease)   . Hematuria, microscopic   . Hemorrhoids   . Hypercholesteremia   . IBS (irritable bowel syndrome)   . Memory deficits 02/15/2013  . Memory loss   . RLS (restless legs syndrome)   . TIA (transient ischemic attack)   . Urinary incontinence   . Vitamin D deficiency     PAST SURGICAL HISTORY: Past Surgical History:  Procedure Laterality Date  . ABDOMINAL HYSTERECTOMY  1995  . corneal transplant for keratonconus    . EYE SURGERY  2013  . left foot fracture with ORIF  07/2004   Dr. Sharol Given  . right bunion surgery      FAMILY HISTORY: Family History  Problem Relation Age of Onset  . Hyperlipidemia Sister   . Diabetes Brother   . Hypertension Mother   . Anemia Mother   . Dementia Mother   . Kidney disease Father   . Breast cancer      aunt  . Diabetes      aunt,brother,uncle  . Kidney disease      father  . Dementia Maternal Grandmother     SOCIAL HISTORY: Social History   Social History  . Marital status: Married    Spouse name: Mallie Mussel  . Number of children: 3  . Years of education: college-2   Occupational History  .  Retired   Social History Main Topics  . Smoking status: Never Smoker  . Smokeless tobacco: Never Used  . Alcohol use No  . Drug use: No  . Sexual activity: Not on file   Other Topics Concern  . Not on file   Social History Narrative   Paige Pena   Parrott, daughter    Rithika Seel info release form to share her medical records with her children   Lives at home w/ her husband   Right-handed   Caffeine: tea on occasion      PHYSICAL EXAM  Vitals:   05/22/16 1353  BP: 113/66  Pulse: 92  Weight: 124 lb 9.6 oz (56.5 kg)   Body mass index is 23.54 kg/m.  MMSE - Mini Mental State Exam 05/22/2016 10/03/2015 11/08/2013  Orientation to time 4 5 4   Orientation to Place 5 5 5   Registration 3 3 3   Attention/ Calculation 3 5 5   Recall 3  3 3  Language- name 2 objects 1 2 2   Language- repeat 1 1 1   Language- follow 3 step command 3 2 3   Language- read & follow direction 1 1 1   Write a sentence 1 1 1   Copy design 1 1 1   Total score 26 29 29      Generalized: Well developed, in no acute distress   Neurological examination  Mentation: Alert oriented to time, place, history taking. Follows all commands speech and language fluent Cranial nerve II-XII: Pupils were equal round reactive to light. Extraocular movements were full, visual field were full on confrontational test. Facial sensation and strength were normal. Uvula tongue midline. Head turning and shoulder shrug  were normal and symmetric. Motor: The motor testing reveals 5 over 5 strength of all 4 extremities. Good symmetric motor tone is noted throughout.  Sensory: Sensory testing is intact to soft touch on all 4 extremities. No evidence of extinction is noted.  Coordination: Cerebellar testing reveals good finger-nose-finger and heel-to-shin bilaterally.  Gait and station: Gait is normal. Tandem gait is normal. Romberg is negative. No drift is seen.  Reflexes: Deep tendon reflexes are symmetric and normal bilaterally.   DIAGNOSTIC DATA (LABS, IMAGING, TESTING) - I reviewed patient records, labs, notes, testing and imaging myself where available.  Lab Results  Component Value Date   WBC 4.2 01/04/2016   HGB 13.8 01/04/2016   HCT 41.2 01/04/2016   MCV 88.0 01/04/2016   PLT 175.0  01/04/2016      Component Value Date/Time   NA 142 01/04/2016 1133   K 3.7 01/04/2016 1133   CL 105 01/04/2016 1133   CO2 28 01/04/2016 1133   GLUCOSE 100 (H) 01/04/2016 1133   BUN 15 01/04/2016 1133   CREATININE 0.94 01/04/2016 1133   CALCIUM 9.4 01/04/2016 1133   PROT 7.0 01/04/2016 1133   ALBUMIN 4.1 01/04/2016 1133   AST 15 01/04/2016 1133   ALT 11 01/04/2016 1133   ALKPHOS 93 01/04/2016 1133   BILITOT 0.5 01/04/2016 1133   GFRNONAA 54 (L) 05/07/2015 2203   GFRAA >60 05/07/2015 2203   Lab Results  Component Value Date   CHOL 187 07/04/2015   HDL 72 07/04/2015   LDLCALC 104 07/04/2015   LDLDIRECT 110.7 03/07/2013   TRIG 57 07/04/2015   CHOLHDL 2.6 07/04/2015   Lab Results  Component Value Date   HGBA1C 5.6 03/21/2015   Lab Results  Component Value Date   VITAMINB12 412 09/16/2010   Lab Results  Component Value Date   TSH 2.56 10/05/2014      ASSESSMENT AND PLAN 76 y.o. year old female  has a past medical history of Acute cystitis; Anxiety; Aphasia; Asthmatic bronchitis; Cerebrovascular disease; Chest pain, atypical; Diverticulosis of colon; DJD (degenerative joint disease); Dysphonia; Esophageal stricture; Fibromyalgia; Gastroparesis; GERD (gastroesophageal reflux disease); Hematuria, microscopic; Hemorrhoids; Hypercholesteremia; IBS (irritable bowel syndrome); Memory deficits (02/15/2013); Memory loss; RLS (restless legs syndrome); TIA (transient ischemic attack); Urinary incontinence; and Vitamin D deficiency. here with:  1. Mild memory disturbance  The patient's memory score has declined slightly. The patient contributes this to her being nervous. We discussed starting Namenda. However the patient prefers to come back for a sooner visit and if her memory score declines  she will consider it then. The patient will return in 3 months. We will repeat an MMSE at that visit. I did give her a handout reviewing Namenda.  I spent 15 minutes with the patient 50% of  this time was spent reviewing  Namenda.   Ward Givens, MSN, NP-C 05/22/2016, 11:29 AM [facility] [address], [place], [place] [address] [phone]

## 2016-05-29 ENCOUNTER — Other Ambulatory Visit: Payer: Self-pay | Admitting: Family Medicine

## 2016-06-04 ENCOUNTER — Telehealth: Payer: Self-pay | Admitting: Family Medicine

## 2016-06-04 ENCOUNTER — Ambulatory Visit (INDEPENDENT_AMBULATORY_CARE_PROVIDER_SITE_OTHER): Payer: Medicare Other | Admitting: Family Medicine

## 2016-06-04 ENCOUNTER — Ambulatory Visit: Payer: Self-pay | Admitting: Family Medicine

## 2016-06-04 ENCOUNTER — Encounter: Payer: Self-pay | Admitting: Family Medicine

## 2016-06-04 VITALS — BP 112/72 | HR 65 | Temp 98.4°F | Resp 16 | Ht 61.0 in | Wt 127.4 lb

## 2016-06-04 DIAGNOSIS — F32A Depression, unspecified: Secondary | ICD-10-CM

## 2016-06-04 DIAGNOSIS — E785 Hyperlipidemia, unspecified: Secondary | ICD-10-CM | POA: Diagnosis not present

## 2016-06-04 DIAGNOSIS — F419 Anxiety disorder, unspecified: Principal | ICD-10-CM

## 2016-06-04 DIAGNOSIS — E559 Vitamin D deficiency, unspecified: Secondary | ICD-10-CM | POA: Diagnosis not present

## 2016-06-04 DIAGNOSIS — F329 Major depressive disorder, single episode, unspecified: Secondary | ICD-10-CM

## 2016-06-04 DIAGNOSIS — F418 Other specified anxiety disorders: Secondary | ICD-10-CM | POA: Diagnosis not present

## 2016-06-04 LAB — CBC WITH DIFFERENTIAL/PLATELET
BASOS PCT: 1 %
Basophils Absolute: 46 cells/uL (ref 0–200)
EOS ABS: 138 {cells}/uL (ref 15–500)
Eosinophils Relative: 3 %
HEMATOCRIT: 37 % (ref 35.0–45.0)
Hemoglobin: 12.2 g/dL (ref 11.7–15.5)
LYMPHS PCT: 39 %
Lymphs Abs: 1794 cells/uL (ref 850–3900)
MCH: 29.9 pg (ref 27.0–33.0)
MCHC: 33 g/dL (ref 32.0–36.0)
MCV: 90.7 fL (ref 80.0–100.0)
MONO ABS: 414 {cells}/uL (ref 200–950)
MONOS PCT: 9 %
MPV: 11.4 fL (ref 7.5–12.5)
NEUTROS PCT: 48 %
Neutro Abs: 2208 cells/uL (ref 1500–7800)
PLATELETS: 166 10*3/uL (ref 140–400)
RBC: 4.08 MIL/uL (ref 3.80–5.10)
RDW: 13.7 % (ref 11.0–15.0)
WBC: 4.6 10*3/uL (ref 3.8–10.8)

## 2016-06-04 LAB — HEPATIC FUNCTION PANEL
ALBUMIN: 3.8 g/dL (ref 3.6–5.1)
ALK PHOS: 83 U/L (ref 33–130)
ALT: 10 U/L (ref 6–29)
AST: 16 U/L (ref 10–35)
BILIRUBIN INDIRECT: 0.2 mg/dL (ref 0.2–1.2)
Bilirubin, Direct: 0.1 mg/dL (ref <=0.2)
TOTAL PROTEIN: 6.3 g/dL (ref 6.1–8.1)
Total Bilirubin: 0.3 mg/dL (ref 0.2–1.2)

## 2016-06-04 LAB — LIPID PANEL
CHOL/HDL RATIO: 2.9 ratio (ref <5.0)
CHOLESTEROL: 222 mg/dL — AB (ref <200)
HDL: 76 mg/dL (ref >50)
LDL Cholesterol: 127 mg/dL — ABNORMAL HIGH (ref <100)
TRIGLYCERIDES: 96 mg/dL (ref <150)
VLDL: 19 mg/dL (ref <30)

## 2016-06-04 LAB — BASIC METABOLIC PANEL
BUN: 18 mg/dL (ref 7–25)
CALCIUM: 9.3 mg/dL (ref 8.6–10.4)
CO2: 29 mmol/L (ref 20–31)
Chloride: 104 mmol/L (ref 98–110)
Creat: 0.95 mg/dL — ABNORMAL HIGH (ref 0.60–0.93)
GLUCOSE: 94 mg/dL (ref 65–99)
Potassium: 4.9 mmol/L (ref 3.5–5.3)
Sodium: 142 mmol/L (ref 135–146)

## 2016-06-04 LAB — TSH: TSH: 2.06 m[IU]/L

## 2016-06-04 MED ORDER — TOLTERODINE TARTRATE ER 4 MG PO CP24: ORAL_CAPSULE | ORAL | 0 refills | Status: DC

## 2016-06-04 MED ORDER — FLUTICASONE PROPIONATE 50 MCG/ACT NA SUSP: 2.0000 | Freq: Every day | NASAL | 0 refills | Status: DC | PRN

## 2016-06-04 MED ORDER — DULOXETINE HCL 30 MG PO CPEP: 30.0000 mg | ORAL_CAPSULE | Freq: Every day | ORAL | 3 refills | Status: DC

## 2016-06-04 MED ORDER — OMEPRAZOLE 40 MG PO CPDR: 40.0000 mg | DELAYED_RELEASE_CAPSULE | Freq: Every day | ORAL | 0 refills | Status: DC

## 2016-06-04 NOTE — Progress Notes (Signed)
   Subjective:    Patient ID: ELLAREE GEAR, female    DOB: 10/10/1940, 76 y.o.   MRN: [id number]  HPI Hyperlipidemia- chronic problem.  Pt stopped her Zetia.  Pt was exercising regularly until January when husband became ill.  Denies abd pain, N/V.  Vit D deficiency- pt has hx of this, not currently taking Vit D supplement.  + fatigue.  Anxiety/depression- chronic problem, on Cymbalta daily.  sxs are well controlled even w/ increased stress of husband's illness but pt feels she needs 'a boost'.   Review of Systems For ROS see HPI     Objective:   Physical Exam  Constitutional: She is oriented to person, place, and time. She appears well-developed and well-nourished. No distress.  HENT:  Head: Normocephalic and atraumatic.  Eyes: Conjunctivae and EOM are normal. Pupils are equal, round, and reactive to light.  Neck: Normal range of motion. Neck supple. No thyromegaly present.  Cardiovascular: Normal rate, regular rhythm, normal heart sounds and intact distal pulses.   No murmur heard. Pulmonary/Chest: Effort normal and breath sounds normal. No respiratory distress.  Abdominal: Soft. She exhibits no distension. There is no tenderness.  Musculoskeletal: She exhibits no edema.  Lymphadenopathy:    She has no cervical adenopathy.  Neurological: She is alert and oriented to person, place, and time.  Skin: Skin is warm and dry.  Psychiatric: She has a normal mood and affect. Her behavior is normal.  Vitals reviewed.         Assessment & Plan:

## 2016-06-04 NOTE — Telephone Encounter (Signed)
Medication filled to pharmacy as requested.   

## 2016-06-04 NOTE — Progress Notes (Signed)
Pre visit review using our clinic review tool, if applicable. No additional management support is needed unless otherwise documented below in the visit note. 

## 2016-06-04 NOTE — Telephone Encounter (Signed)
Pt states that only duloxetine was listed has a refill and not her other Rx, pt asking that all Rx get called in. [facility] on [address]. Pt states that she is out of all Rx, Pt wants them all in a 90 day supply

## 2016-06-04 NOTE — Patient Instructions (Signed)
Schedule your complete physical in 6 months and your Annual Wellness Visit with Maudie Mercury around the same time [facility] notify you of your lab results and make any changes if needed Increase the Duloxetine to 30mg  daily Continue to work on healthy diet and regular exercise- you look great! Call with any questions or concerns Happy [birth year]ated Birthday!!!

## 2016-06-05 ENCOUNTER — Encounter: Payer: Self-pay | Admitting: General Practice

## 2016-06-05 LAB — VITAMIN D 25 HYDROXY (VIT D DEFICIENCY, FRACTURES): Vit D, 25-Hydroxy: 46 ng/mL (ref 30–100)

## 2016-06-05 NOTE — Assessment & Plan Note (Signed)
Deteriorated w/ husband's recent serious illness.  Increase Cymbalta to 30mg  daily and monitor for improvement.  Pt expressed understanding and is in agreement w/ plan.

## 2016-06-05 NOTE — Assessment & Plan Note (Signed)
Chronic problem.  Pt stopped her Zetia w/o reason.  She was exercising regularly until January when her husband became ill.  Check labs and restart meds prn.  Pt expressed understanding and is in agreement w/ plan.

## 2016-06-05 NOTE — Assessment & Plan Note (Signed)
Pt has hx of this.  Not currently on medication.  Check labs.  Replete prn.

## 2016-07-10 ENCOUNTER — Ambulatory Visit (INDEPENDENT_AMBULATORY_CARE_PROVIDER_SITE_OTHER): Payer: Medicare Other | Admitting: Family Medicine

## 2016-07-10 ENCOUNTER — Encounter: Payer: Self-pay | Admitting: Family Medicine

## 2016-07-10 VITALS — BP 121/75 | HR 73 | Temp 98.1°F | Resp 16 | Ht 61.0 in | Wt 124.1 lb

## 2016-07-10 DIAGNOSIS — D2271 Melanocytic nevi of right lower limb, including hip: Secondary | ICD-10-CM | POA: Diagnosis not present

## 2016-07-10 DIAGNOSIS — T148XXA Other injury of unspecified body region, initial encounter: Secondary | ICD-10-CM

## 2016-07-10 NOTE — Progress Notes (Signed)
Pre visit review using our clinic review tool, if applicable. No additional management support is needed unless otherwise documented below in the visit note. 

## 2016-07-10 NOTE — Patient Instructions (Signed)
Follow up as needed/scheduled I suspect the bruising was an unintentional injury and due in part to the Aspirin Continue the Aspirin daily If you develop additional bruising- particularly on back or trunk- let me know! We'll call you with your dermatology appt for the mole on your R lower leg Call with any questions or concerns Have a great summer!!!

## 2016-07-10 NOTE — Progress Notes (Signed)
   Subjective:    Patient ID: Paige Pena, female    DOB: [birth year], 76 y.o.   MRN: [id number]  HPI Bruising- R upper thigh, noticed yesterday.  Pt denies recent injury and isn't aware of bumping into anything.  No contact w/ pets that have jumped.  Pt denies pain.  Currently on ASA 325mg  daily due to hx of TIA.  No other bruising.   Review of Systems For ROS see HPI     Objective:   Physical Exam  Constitutional: She is oriented to person, place, and time. She appears well-developed and well-nourished. No distress.  HENT:  Head: Normocephalic and atraumatic.  Neurological: She is alert and oriented to person, place, and time.  Skin: Skin is warm and dry.  Bruising of R medial thigh w/o concerning features or hematoma Atypical nevus of R lower leg  Vitals reviewed.         Assessment & Plan:  Bruising- no concerning features at this time, no other bruises noted.  Will continue ASA 325mg  as pt has never had issues w/ bleeding/bruising before.  Suspect this was an accidental bump.  If she has worsening bruising, will decrease ASA to 162mg  daily.  Pt expressed understanding and is in agreement w/ plan.   Atypical nevus- refer to derm.

## 2016-08-09 IMAGING — MR MR CERVICAL SPINE W/O CM
4 of 5 series · 17 of 48 positions shown · non-contrast
Comparison: Brain MR and head CT 05/08/2015. No comparison cervical
spine MR.

CLINICAL DATA: 74-year-old female with numbness and slurred speech.
Left hand and left leg numbness. Subsequent encounter.

EXAM:
MRI CERVICAL SPINE WITHOUT CONTRAST
TECHNIQUE: Multiplanar, multisequence MR imaging of the cervical spine was
performed. No intravenous contrast was administered.

[Series 2: T2 · sagittal · 3.0mm · 0.39mm/px · 6 of 14 slices shown (1 of 2)]
[im 1/14]
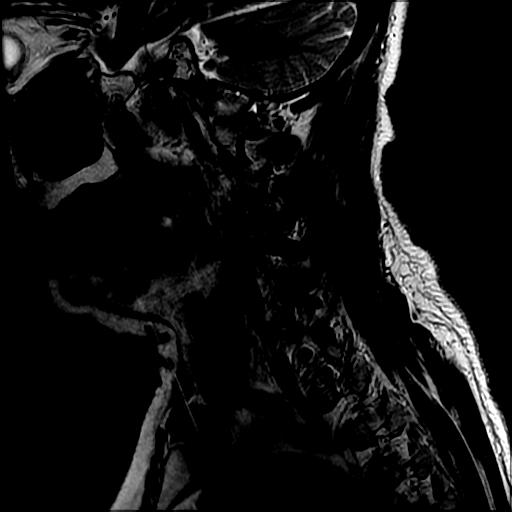
[im 3/14]
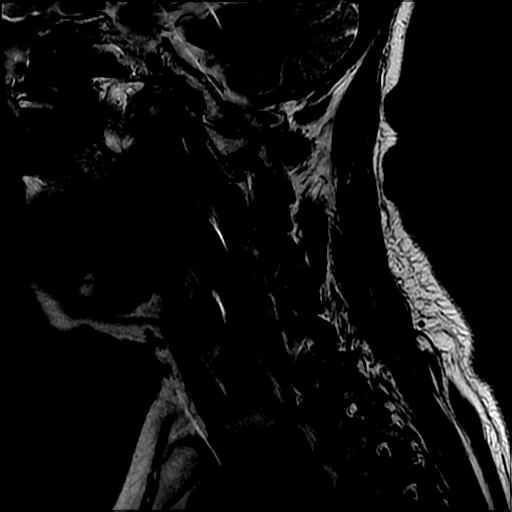
[im 6/14]
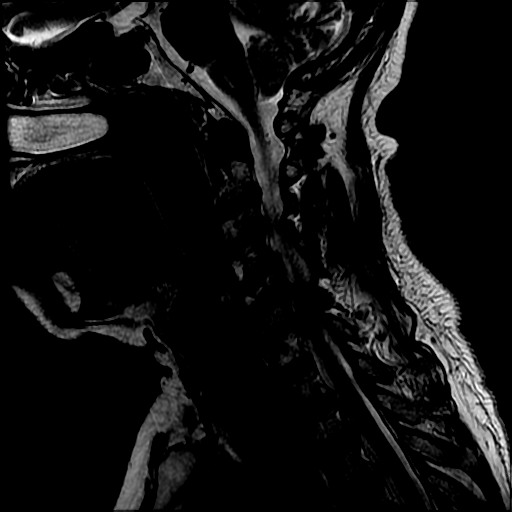
[im 8/14]
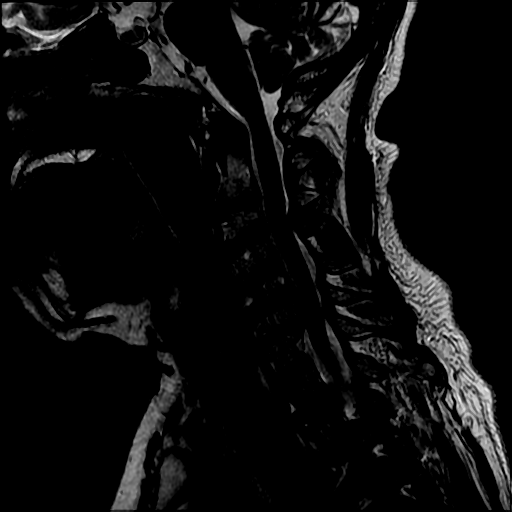
[im 11/14]
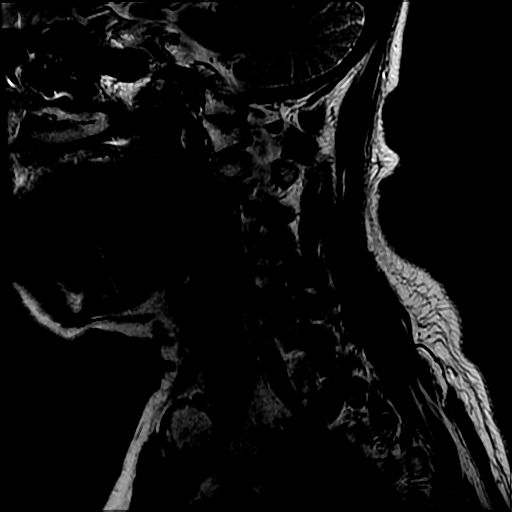
[im 14/14]
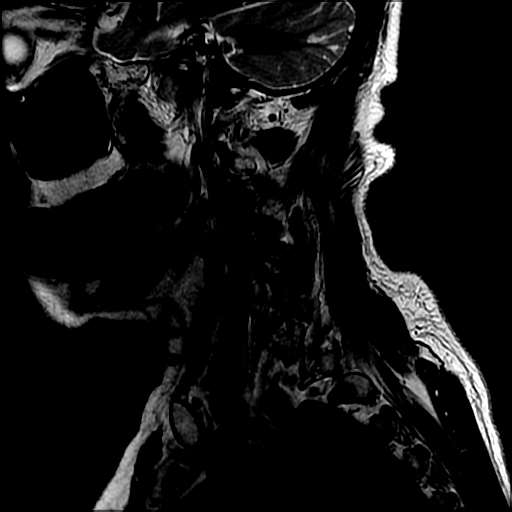

[Series 3: T1 · sagittal · 3.0mm · 0.39mm/px · 3 of 14 slices shown]
[im 3/14]
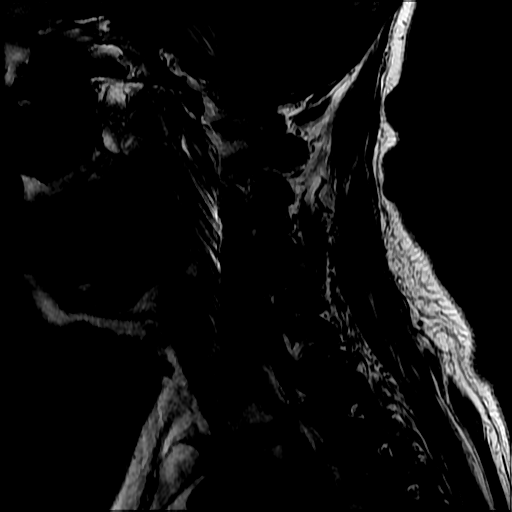
[im 7/14]
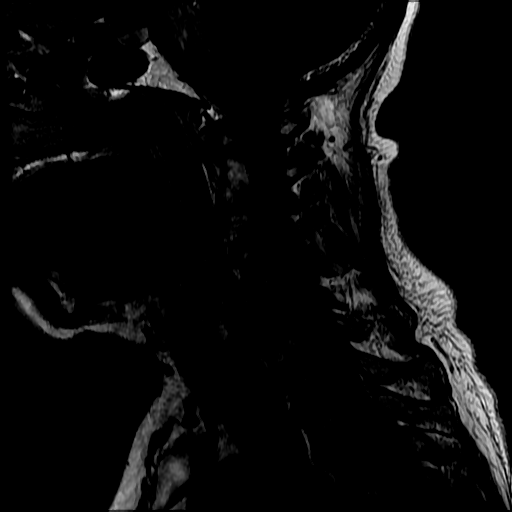
[im 11/14]
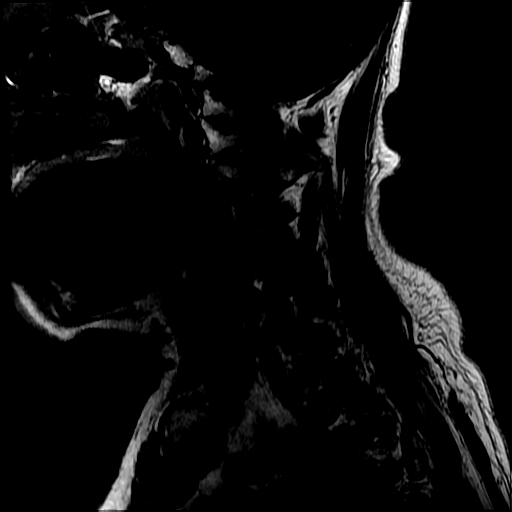

[Series 4: sag ir · sagittal · 3.0mm · 0.39mm/px · 3 of 14 slices shown]
[im 3/14]
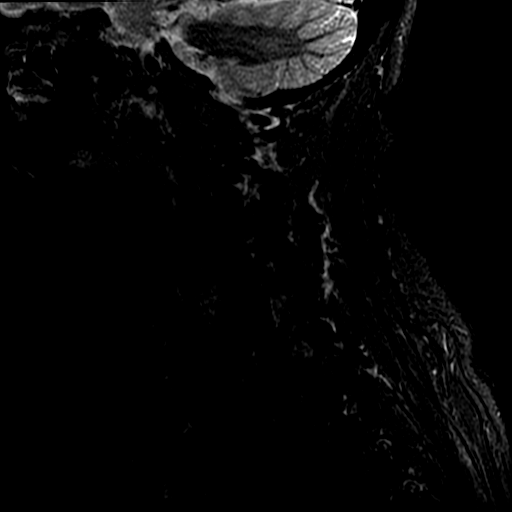
[im 7/14]
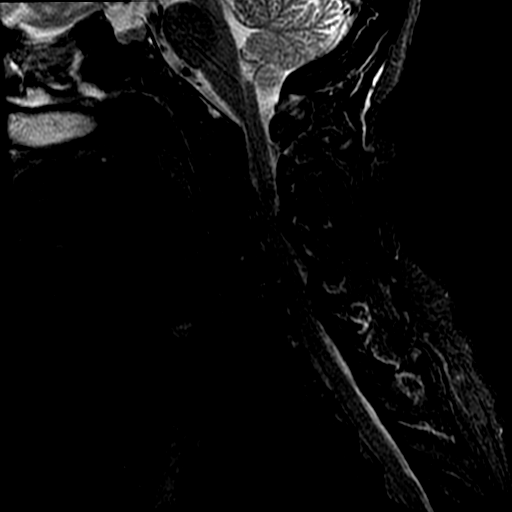
[im 11/14]
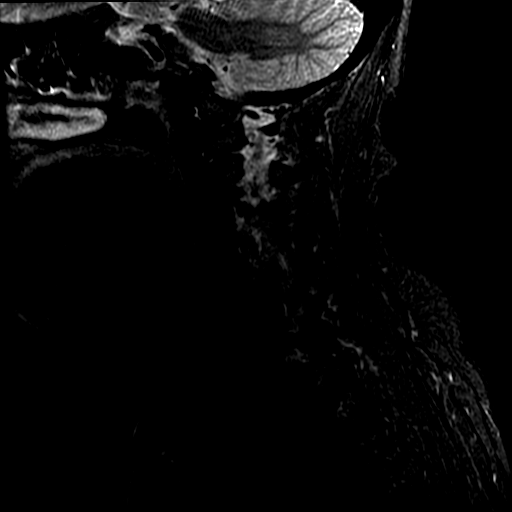

[Series 5: T2 · axial · 3.0mm · 0.39mm/px · z∈[-95,-24]mm · 5 of 27 slices shown (2 of 2)]
[im 1/27]
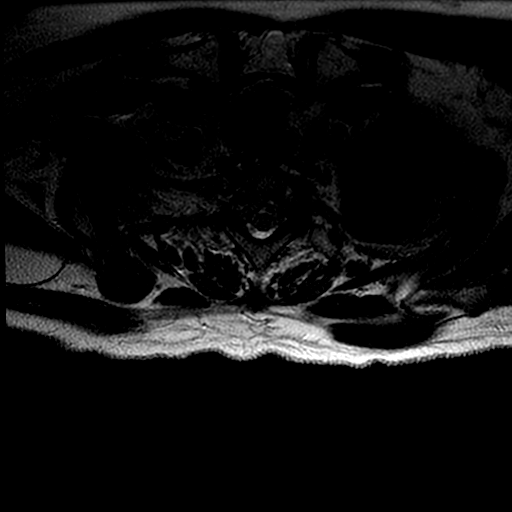
[im 5/27]
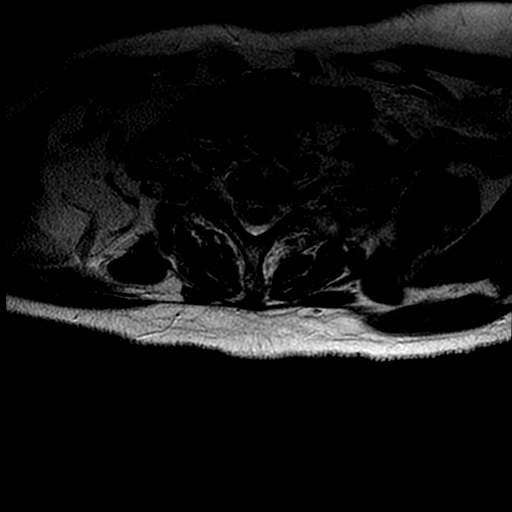
[im 9/27]
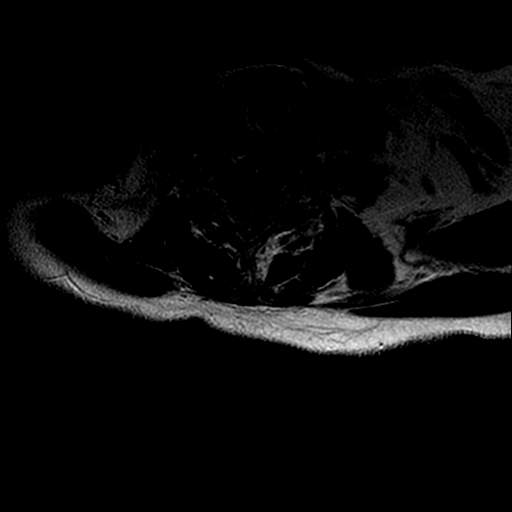
[im 15/27]
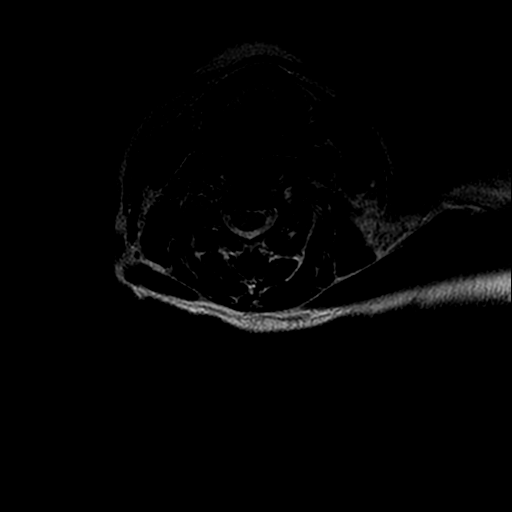
[im 23/27]
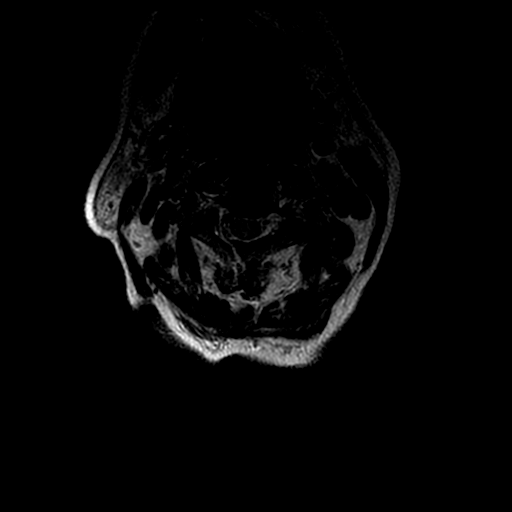

[17 of 48 positions shown; findings below may reference images not displayed]

FINDINGS: Exam is motion degraded.

Cerebellar tonsil location within the range of normal limits.
Minimal prominence of the transverse ligament with slight narrowing
of the ventral aspect of the thecal sac without cord compression.

No focal cervical cord signal abnormality.

No worrisome paravertebral abnormality.

No focal osseous abnormality.

C2-3:  Minimal bulge.  Mild narrowing ventral thecal sac.

C3-4: Broad-based disc osteophyte complex greater to left. Posterior
element hypertrophy. Mild to moderate left sided and mild
right-sided spinal stenosis. Cord flattening greater on left.
Moderate left foraminal narrowing

C4-5: Facet degenerative changes. Minimal anterior slip C4. Bulge.
Mild narrowing ventral thecal sac. Minimal foraminal narrowing.

C5-6: Broad-based disc osteophyte complex. Moderate spinal stenosis
with mild cord flattening. Uncinate hypertrophy. Moderate to marked
left-sided and mild right-sided foraminal narrowing.

C6-7: Broad-based disc osteophyte complex greater to the right.
Spinal stenosis and cord flattening greater on the right. Uncinate
hypertrophy with moderate right-sided and mild left-sided foraminal
narrowing.

C7-T1: Mild facet degenerative changes. Minimal bulge. No
significant spinal stenosis or foraminal narrowing.

T1-2: Facet degenerative changes. Disc osteophyte extends into the
neural foramen with moderate-to-marked foraminal narrowing greater
on the left. Narrowing ventral thecal sac without cord compression.

T2-3: Disc osteophyte complex with mild narrowing ventral thecal sac
without cord contact. Facet degenerative changes with mild bilateral
foraminal narrowing.

T3-4: Disc osteophyte complex. Narrowing ventral thecal sac. Mild
bilateral foraminal narrowing.
IMPRESSION: Cervical spondylotic changes most prominent C3-4, C5-6 and C6-7.
Summary pertinent findings includes:

C3-4 broad-based disc osteophyte complex greater to left. Posterior
element hypertrophy. Mild to moderate left sided and mild
right-sided spinal stenosis. Cord flattening greater on left.
Moderate left foraminal narrowing.

C4-5 bulge.  Mild narrowing ventral thecal sac.

C5-6 broad-based disc osteophyte complex. Moderate spinal stenosis
with mild cord flattening. Moderate to marked left-sided and mild
right-sided foraminal narrowing.

C6-7 broad-based disc osteophyte complex greater to the right.
Spinal stenosis and cord flattening greater on the right. Moderate
right-sided and mild left-sided foraminal narrowing.

T1-2 moderate-to-marked foraminal narrowing greater on the left.
Narrowing ventral thecal sac without cord compression.

T2-3 and T3-4 mild narrowing ventral thecal sac and mild bilateral
foraminal narrowing.

Please see above for further detail.

## 2016-09-12 ENCOUNTER — Other Ambulatory Visit: Payer: Self-pay | Admitting: Family Medicine

## 2016-10-28 LAB — HM MAMMOGRAPHY

## 2016-10-28 LAB — HM DEXA SCAN

## 2016-11-03 ENCOUNTER — Encounter: Payer: Self-pay | Admitting: General Practice

## 2016-11-04 ENCOUNTER — Other Ambulatory Visit: Payer: Self-pay | Admitting: Family Medicine

## 2016-11-24 ENCOUNTER — Ambulatory Visit: Payer: Medicare Other | Admitting: Adult Health

## 2016-11-25 ENCOUNTER — Encounter: Payer: Self-pay | Admitting: Adult Health

## 2016-11-25 ENCOUNTER — Ambulatory Visit (INDEPENDENT_AMBULATORY_CARE_PROVIDER_SITE_OTHER): Payer: Medicare Other | Admitting: Adult Health

## 2016-11-25 VITALS — BP 124/81 | HR 87 | Ht 61.0 in | Wt 127.6 lb

## 2016-11-25 DIAGNOSIS — R413 Other amnesia: Secondary | ICD-10-CM | POA: Diagnosis not present

## 2016-11-25 NOTE — Progress Notes (Signed)
I have read the note, and I agree with the clinical assessment and plan.  Glanda Spanbauer KEITH   

## 2016-11-25 NOTE — Progress Notes (Signed)
PATIENT: Paige Pena DOB: [birth year]  REASON FOR VISIT: follow up- [facility] HISTORY FROM: patient  HISTORY OF PRESENT ILLNESS: Today 11/25/16 Paige Pena is a 76 year old female with a history of mild memory disturbance. She returns today for follow-up. The patient reports that she has continued to do well. She lives at home with her husband. She is able to complete all ADLs independently. She operates a Teacher, music without difficulty. She manages her own finances and balances her checkbook without difficulty. Denies any trouble sleeping with the exception she has to get up to urinate. She denies any significant changes in her mood or behavior but does note that sometimes she gets agitated easily. Overall she feels that she is doing well. She returns today for an evaluation.  HISTORY 05/19/16: Paige Pena is a 76 year old female with a history of mild memory disturbance. She returns today for follow-up. At the last visit she was started on Aricept however she was unable to tolerate this medication. She feels that her memory has remained stable. She lives at home with her spouse. She is able to complete all ADLs independently. She operates a Teacher, music without difficulty. Denies any trouble cooking. She manages her finances independently. She denies having to give up any hobbies due to her memory. She oversees her own medications and appointments without difficulty.sleeping well with melatonin. Overall she feels that she is doing well. She returns today for follow-up.   REVIEW OF SYSTEMS: Out of a complete 14 system review of symptoms, the patient complains only of the following symptoms, and all other reviewed systems are negative.  See history of present illness  ALLERGIES: Allergies  Allergen Reactions  . Morphine Other (See Comments)    REACTION: lethargy/malaise  . Metoclopramide Hcl Anxiety and Other (See Comments)    REACTION: anxious and out of her mind  . Aricept [Donepezil  Hcl]     nausea  . Doxycycline Diarrhea  . Penicillins Itching and Rash    Has patient had a PCN reaction causing immediate rash, facial/tongue/throat swelling, SOB or lightheadedness with hypotension: No Has patient had a PCN reaction causing severe rash involving mucus membranes or skin necrosis: No Has patient had a PCN reaction that required hospitalization No Has patient had a PCN reaction occurring within the last 10 years: Yes If all of the above answers are "NO", then may proceed with Cephalosporin use.    . Prednisone Itching and Rash  . Sulfonamide Derivatives Itching and Rash    HOME MEDICATIONS: Outpatient Medications Prior to Visit  Medication Sig Dispense Refill  . ALPRAZolam (XANAX) 0.5 MG tablet TAKE  1/2 -1 TABLET BY MOUTH THREE TIMES DAILY AS NEEDED (Patient taking differently: Take 0.5 mg by mouth 3 (three) times daily as needed for anxiety. TAKE  1/2 -1 TABLET BY MOUTH THREE TIMES DAILY AS NEEDED) 90 tablet 2  . aspirin (ECOTRIN) 325 MG EC tablet Take 325 mg by mouth daily.      . Cholecalciferol (D3 ADULT PO) Place 5,000 Units under the tongue.     . DULoxetine (CYMBALTA) 30 MG capsule TAKE 1 CAPSULE(30 MG) BY MOUTH DAILY 30 capsule 6  . fluticasone (FLONASE) 50 MCG/ACT nasal spray Place 2 sprays into both nostrils daily as needed for allergies. 48 g 0  . Iron-Vitamins (GERITOL) LIQD Take 15 mLs by mouth daily.    Marland Kitchen KRILL OIL OMEGA-3 PO Take 1 capsule by mouth daily.    . Melatonin 5 MG CAPS Take by mouth  at bedtime.    . Multiple Vitamins-Minerals (HAIR SKIN AND NAILS FORMULA PO) Take by mouth. Collagen brand    . NON FORMULARY donmperidone    . omeprazole (PRILOSEC) 40 MG capsule TAKE 1 CAPSULE(40 MG) BY MOUTH DAILY 90 capsule 0  . prednisoLONE acetate (PRED FORTE) 1 % ophthalmic suspension Place 1 drop into both eyes daily.     Marland Kitchen tolterodine (DETROL LA) 4 MG 24 hr capsule TAKE 1 CAPSULE(4 MG) BY MOUTH AT BEDTIME 90 capsule 0   No facility-administered  medications prior to visit.     PAST MEDICAL HISTORY: Past Medical History:  Diagnosis Date  . Acute cystitis   . Anxiety   . Aphasia    transsient aphasia Jan 2012  . Asthmatic bronchitis   . Cerebrovascular disease   . Chest pain, atypical   . Diverticulosis of colon   . DJD (degenerative joint disease)   . Dysphonia   . Esophageal stricture   . Fibromyalgia   . Gastroparesis   . GERD (gastroesophageal reflux disease)   . Hematuria, microscopic   . Hemorrhoids   . Hypercholesteremia   . IBS (irritable bowel syndrome)   . Memory deficits 02/15/2013  . Memory loss   . RLS (restless legs syndrome)   . TIA (transient ischemic attack)   . Urinary incontinence   . Vitamin D deficiency     PAST SURGICAL HISTORY: Past Surgical History:  Procedure Laterality Date  . ABDOMINAL HYSTERECTOMY  1995  . corneal transplant for keratonconus    . EYE SURGERY  2013  . left foot fracture with ORIF  07/2004   Dr. Sharol Given  . right bunion surgery      FAMILY HISTORY: Family History  Problem Relation Age of Onset  . Hyperlipidemia Sister   . Diabetes Brother   . Hypertension Mother   . Anemia Mother   . Dementia Mother   . Kidney disease Father   . Breast cancer Unknown        aunt  . Diabetes Unknown        aunt,brother,uncle  . Kidney disease Unknown        father  . Dementia Maternal Grandmother     SOCIAL HISTORY: Social History   Social History  . Marital status: Married    Spouse name: Mallie Mussel  . Number of children: 3  . Years of education: college-2   Occupational History  .  Retired   Social History Main Topics  . Smoking status: Never Smoker  . Smokeless tobacco: Never Used  . Alcohol use No  . Drug use: No  . Sexual activity: Not on file   Other Topics Concern  . Not on file   Social History Narrative   Lennie Hummer   [place], daughter   Ira Busbin info release form to share her medical records with her children     Lives at home w/ her husband   Right-handed   Caffeine: tea on occasion      PHYSICAL EXAM  Vitals:   11/25/16 0930  BP: 124/81  Pulse: 87  Weight: 127 lb 9.6 oz (57.9 kg)  Height: 5\' 1"  (1.549 m)   Body mass index is 24.11 kg/m.   MMSE - Mini Mental State Exam 11/25/2016 05/22/2016 10/03/2015  Orientation to time 5 4 5   Orientation to Place 4 5 5   Registration 3 3 3   Attention/ Calculation 5 3 5   Recall 3 3 3   Language- name 2 objects 2 1  2  Language- repeat 1 1 1   Language- follow 3 step command 3 3 2   Language- read & follow direction 1 1 1   Write a sentence 1 1 1   Copy design 1 1 1   Total score 29 26 29      Generalized: Well developed, in no acute distress   Neurological examination  Mentation: Alert oriented to time, place, history taking. Follows all commands speech and language fluent Cranial nerve II-XII: Pupils were equal round reactive to light. Extraocular movements were full, visual field were full on confrontational test. Facial sensation and strength were normal. Uvula tongue midline. Head turning and shoulder shrug  were normal and symmetric. Motor: The motor testing reveals 5 over 5 strength of all 4 extremities. Good symmetric motor tone is noted throughout.  Sensory: Sensory testing is intact to soft touch on all 4 extremities. No evidence of extinction is noted.  Coordination: Cerebellar testing reveals good finger-nose-finger and heel-to-shin bilaterally.  Gait and station: Gait is normal. Reflexes: Deep tendon reflexes are symmetric and normal bilaterally.   DIAGNOSTIC DATA (LABS, IMAGING, TESTING) - I reviewed patient records, labs, notes, testing and imaging myself where available.  Lab Results  Component Value Date   WBC 4.6 06/04/2016   HGB 12.2 06/04/2016   HCT 37.0 06/04/2016   MCV 90.7 06/04/2016   PLT 166 06/04/2016      Component Value Date/Time   NA 142 06/04/2016 1534   K 4.9 06/04/2016 1534   CL 104 06/04/2016 1534   CO2 29  06/04/2016 1534   GLUCOSE 94 06/04/2016 1534   BUN 18 06/04/2016 1534   CREATININE 0.95 (H) 06/04/2016 1534   CALCIUM 9.3 06/04/2016 1534   PROT 6.3 06/04/2016 1534   ALBUMIN 3.8 06/04/2016 1534   AST 16 06/04/2016 1534   ALT 10 06/04/2016 1534   ALKPHOS 83 06/04/2016 1534   BILITOT 0.3 06/04/2016 1534   GFRNONAA 54 (L) 05/07/2015 2203   GFRAA >60 05/07/2015 2203   Lab Results  Component Value Date   CHOL 222 (H) 06/04/2016   HDL 76 06/04/2016   LDLCALC 127 (H) 06/04/2016   LDLDIRECT 110.7 03/07/2013   TRIG 96 06/04/2016   CHOLHDL 2.9 06/04/2016   Lab Results  Component Value Date   HGBA1C 5.6 03/21/2015   Lab Results  Component Value Date   VITAMINB12 412 09/16/2010   Lab Results  Component Value Date   TSH 2.06 06/04/2016      ASSESSMENT AND PLAN 76 y.o. year old female  has a past medical history of Acute cystitis; Anxiety; Aphasia; Asthmatic bronchitis; Cerebrovascular disease; Chest pain, atypical; Diverticulosis of colon; DJD (degenerative joint disease); Dysphonia; Esophageal stricture; Fibromyalgia; Gastroparesis; GERD (gastroesophageal reflux disease); Hematuria, microscopic; Hemorrhoids; Hypercholesteremia; IBS (irritable bowel syndrome); Memory deficits (02/15/2013); Memory loss; RLS (restless legs syndrome); TIA (transient ischemic attack); Urinary incontinence; and Vitamin D deficiency. here with:  1. Memory disturbance  The patient's memory score has remained stable. MMSE today is 29/30. She is currently not on any memory medication. We will continue to monitor at this time. If she notices any changes with her memory she was advised to call our office. Otherwise we will see her back in 6 months or sooner if needed.  I spent 15 minutes with the patient. 50% of this time was spent reviewing her memory score      Ward Givens, MSN, NP-C 11/25/2016, 9:37 AM [facility] [address] [place], [place] [address] [phone]

## 2016-11-25 NOTE — Patient Instructions (Signed)
Your Plan:  Continue monitor memory Score is stable today If your symptoms worsen or you develop new symptoms please let us know.   Thank you for coming to see [place] at [facility]. I hope we have been able to provide you high quality care today.  You may receive a patient satisfaction survey over the next few weeks. We would appreciate your feedback and comments so that we may continue to improve ourselves and the health of our patients.

## 2016-11-27 ENCOUNTER — Encounter: Payer: Self-pay | Admitting: Family Medicine

## 2016-12-25 ENCOUNTER — Ambulatory Visit: Payer: Self-pay

## 2016-12-25 ENCOUNTER — Encounter: Payer: Self-pay | Admitting: Family Medicine

## 2016-12-30 NOTE — Progress Notes (Addendum)
Subjective:   Paige Pena is a 76 y.o. female who presents for Medicare Annual (Subsequent) preventive examination.  Review of Systems:  No ROS.  Medicare Wellness Visit. Additional risk factors are reflected in the social history.  Cardiac Risk Factors include: advanced age (>71men, >82 women);dyslipidemia;family history of premature cardiovascular disease Sleep patterns: Sleeps 7 hours. Typically in bed around 2am.  Home Safety/Smoke Alarms: Feels safe in home. Smoke alarms in place.  Living environment; residence and Firearm Safety: Lives with husband in 1 story home.  Seat Belt Safety/Bike Helmet: Wears seat belt.   Female:   [id number]       Mammo-10/28/2016, benign.        Dexa scan-10/28/2016, Osteopenia.         CCS-Colonoscopy 07/08/2011, normal.      Objective:     Vitals: BP (!) 142/70 (BP Location: Left Arm, Patient Position: Sitting, Cuff Size: Normal)   Pulse 80   Ht 5\' 1"  (1.549 m)   Wt 127 lb 3.2 oz (57.7 kg)   SpO2 96%   BMI 24.03 kg/m   Body mass index is 24.03 kg/m.   Tobacco Social History   Tobacco Use  Smoking Status Never Smoker  Smokeless Tobacco Never Used     Counseling given: Not Answered   Past Medical History:  Diagnosis Date  . Acute cystitis   . Anxiety   . Aphasia    transsient aphasia Jan 2012  . Asthmatic bronchitis   . Cerebrovascular disease   . Chest pain, atypical   . Diverticulosis of colon   . DJD (degenerative joint disease)   . Dysphonia   . Esophageal stricture   . Fibromyalgia   . Gastroparesis   . GERD (gastroesophageal reflux disease)   . Hematuria, microscopic   . Hemorrhoids   . Hypercholesteremia   . IBS (irritable bowel syndrome)   . Memory deficits 02/15/2013  . Memory loss   . RLS (restless legs syndrome)   . TIA (transient ischemic attack)   . Urinary incontinence   . Vitamin D deficiency    Past Surgical History:  Procedure Laterality Date  . ABDOMINAL HYSTERECTOMY  1995  . corneal  transplant for keratonconus    . EYE SURGERY  2013  . left foot fracture with ORIF  07/2004   Dr. Sharol Given  . right bunion surgery     Family History  Problem Relation Age of Onset  . Hyperlipidemia Sister   . Diabetes Brother   . Cancer Brother        lung  . Hypertension Mother   . Anemia Mother   . Dementia Mother   . Kidney disease Father   . Breast cancer Unknown        aunt  . Diabetes Unknown        aunt,brother,uncle  . Kidney disease Unknown        father  . Dementia Maternal Grandmother    Social History   Substance and Sexual Activity  Sexual Activity Not on file    Outpatient Encounter Medications as of 12/31/2016  Medication Sig  . ALPRAZolam (XANAX) 0.5 MG tablet TAKE  1/2 -1 TABLET BY MOUTH THREE TIMES DAILY AS NEEDED (Patient taking differently: Take 0.5 mg by mouth 3 (three) times daily as needed for anxiety. TAKE  1/2 -1 TABLET BY MOUTH THREE TIMES DAILY AS NEEDED)  . aspirin (ECOTRIN) 325 MG EC tablet Take 325 mg by mouth daily.    . Calcium-Magnesium-Vitamin D (CALCIUM  1200+D3 PO) Take 1 tablet 2 (two) times daily by mouth.  . DULoxetine (CYMBALTA) 30 MG capsule TAKE 1 CAPSULE(30 MG) BY MOUTH DAILY  . fluticasone (FLONASE) 50 MCG/ACT nasal spray Place 2 sprays into both nostrils daily as needed for allergies.  . Iron-Vitamins (GERITOL) LIQD Take 15 mLs by mouth daily.  Marland Kitchen KRILL OIL OMEGA-3 PO Take 1 capsule by mouth daily.  . Melatonin 5 MG CAPS Take by mouth at bedtime.  . Multiple Vitamins-Minerals (HAIR SKIN AND NAILS FORMULA PO) Take by mouth. Collagen brand  . NON FORMULARY donmperidone  . omeprazole (PRILOSEC) 40 MG capsule TAKE 1 CAPSULE(40 MG) BY MOUTH DAILY  . prednisoLONE acetate (PRED FORTE) 1 % ophthalmic suspension Place 1 drop into both eyes daily.   Marland Kitchen tolterodine (DETROL LA) 4 MG 24 hr capsule TAKE 1 CAPSULE(4 MG) BY MOUTH AT BEDTIME  . Cholecalciferol (D3 ADULT PO) Place 5,000 Units under the tongue.    No facility-administered encounter  medications on file as of 12/31/2016.     Activities of Daily Living In your present state of health, do you have any difficulty performing the following activities: 12/31/2016 06/04/2016  Hearing? N N  Vision? N N  Difficulty concentrating or making decisions? N N  Walking or climbing stairs? N N  Dressing or bathing? N N  Doing errands, shopping? N N  Preparing Food and eating ? N -  Using the Toilet? N -  In the past six months, have you accidently leaked urine? N -  Do you have problems with loss of bowel control? N -  Managing your Medications? N -  Managing your Finances? N -  Housekeeping or managing your Housekeeping? N -  Some recent data might be hidden    Patient Care Team: [name], MD as PCP - General (Family Medicine) Kathrynn Ducking, MD as Consulting Physician (Neurology) Kathreen Cosier, MD (Internal Medicine) Druscilla Brownie, MD as Consulting Physician (Dermatology) [facility] Belva Crome, MD as Consulting Physician (Cardiology)    Assessment:    Physical assessment deferred to PCP.  Exercise Activities and Dietary recommendations Current Exercise Habits: Home exercise routine, Type of exercise: Other - see comments;strength training/weights(bike), Time (Minutes): 30, Frequency (Times/Week): 2, Weekly Exercise (Minutes/Week): 60   Diet (meal preparation, eat out, water intake, caffeinated beverages, dairy products, fruits and vegetables): Drinks water and juice.   Eats healthy diet. Recently started going to gym.   Goals    None    **Goals addressed, Epic issue** Goal:Increase activity.   Fall Risk Fall Risk  12/31/2016 06/04/2016 03/26/2015 10/05/2014 04/04/2014  Falls in the past year? No No Yes No No  Number falls in past yr: - - 1 - -  Injury with Fall? - - No - -  Risk for fall due to : - - Impaired balance/gait - -  Follow up - - Falls prevention discussed - -   Depression Screen PHQ 2/9 Scores  12/31/2016 06/04/2016 03/26/2015 10/05/2014  PHQ - 2 Score 0 0 0 0  PHQ- 9 Score 1 0 - -  Exception Documentation - - Patient refusal -     Cognitive Function MMSE - Mini Mental State Exam 11/25/2016 05/22/2016 10/03/2015 11/08/2013  Orientation to time 5 4 5 4   Orientation to Place 4 5 5 5   Registration 3 3 3 3   Attention/ Calculation 5 3 5 5   Recall 3 3 3 3   Language- name 2 objects 2 1 2  2  Language- repeat 1 1 1 1   Language- follow 3 step command 3 3 2 3   Language- read & follow direction 1 1 1 1   Write a sentence 1 1 1 1   Copy design 1 1 1 1   Total score 29 26 29 29         Ad8 score reviewed for issues:  Issues making decisions: no  Less interest in hobbies / activities: no  Repeats questions, stories (family complaining): no  Trouble using ordinary gadgets (microwave, computer, phone): no  Forgets the month or year: no  Mismanaging finances: no  Remembering appts: no  Daily problems with thinking and/or memory: no Ad8 score is=0     Immunization History  Administered Date(s) Administered  . Influenza Split 11/24/2010, 11/11/2011  . Influenza Whole 12/25/2008, 01/15/2010  . Influenza, High Dose Seasonal PF 11/19/2015  . Influenza,inj,Quad PF,6+ Mos 04/04/2014  . Influenza-Unspecified 12/26/2014, 12/22/2016  . Pneumococcal Conjugate-13 10/05/2014  . Pneumococcal Polysaccharide-23 12/31/2016  . Zoster 05/09/2011   Screening Tests Health Maintenance  Topic Date Due  . TETANUS/TDAP  02/24/2018 (Originally 05/26/1959)  . MAMMOGRAM  10/28/2017  . DEXA SCAN  10/29/2018  . INFLUENZA VACCINE  Completed  . PNA vac Low Risk Adult  Completed      Plan:     Shingles vaccine at pharmacy.   Bring a copy of your living will and/or healthcare power of attorney to your next office visit.  Continue doing brain stimulating activities (puzzles, reading, adult coloring books, staying active) to keep memory sharp.    I have personally reviewed and noted the following in  the patient's chart:   . Medical and social history . Use of alcohol, tobacco or illicit drugs  . Current medications and supplements . Functional ability and status . Nutritional status . Physical activity . Advanced directives . List of other physicians . Hospitalizations, surgeries, and ER visits in previous 12 months . Vitals . Screenings to include cognitive, depression, and falls . Referrals and appointments  In addition, I have reviewed and discussed with patient certain preventive protocols, quality metrics, and best practice recommendations. A written personalized care plan for preventive services as well as general preventive health recommendations were provided to patient.     Gerilyn Nestle, RN  12/31/2016  PCP Notes: -Requesting referral to GI provider. No current issues, just wants local provider. (phone note sent) -Requesting refill for omeprazole (phone note sent) -F/U with PCP (CPE) in 03/2017  Reviewed documentation provided above.  Will address GI and Omeprazole in phone note.  Annye Asa, MD

## 2016-12-31 ENCOUNTER — Ambulatory Visit (INDEPENDENT_AMBULATORY_CARE_PROVIDER_SITE_OTHER): Payer: Medicare Other

## 2016-12-31 ENCOUNTER — Other Ambulatory Visit: Payer: Self-pay

## 2016-12-31 ENCOUNTER — Telehealth: Payer: Self-pay

## 2016-12-31 DIAGNOSIS — Z23 Encounter for immunization: Secondary | ICD-10-CM

## 2016-12-31 DIAGNOSIS — K589 Irritable bowel syndrome without diarrhea: Secondary | ICD-10-CM

## 2016-12-31 DIAGNOSIS — K219 Gastro-esophageal reflux disease without esophagitis: Secondary | ICD-10-CM

## 2016-12-31 DIAGNOSIS — Z0000 Encounter for general adult medical examination without abnormal findings: Secondary | ICD-10-CM

## 2016-12-31 MED ORDER — OMEPRAZOLE 40 MG PO CPDR: DELAYED_RELEASE_CAPSULE | ORAL | 0 refills | Status: DC

## 2016-12-31 MED ORDER — ZOSTER VAC RECOMB ADJUVANTED 50 MCG/0.5ML IM SUSR: 0.5000 mL | Freq: Once | INTRAMUSCULAR | 1 refills | Status: AC

## 2016-12-31 NOTE — Telephone Encounter (Signed)
Patient completed AWV with [facility] today. Patient requesting referral to GI. No issues currently, but would like local GI provider for IBS and reflux.  Refill request for omeprazole 40mg  (90 day). F/U with PCP (CPE) in 03/2017.

## 2016-12-31 NOTE — Patient Instructions (Addendum)
Shingles vaccine at pharmacy.   Bring a copy of your living will and/or healthcare power of attorney to your next office visit.  Continue doing brain stimulating activities (puzzles, reading, adult coloring books, staying active) to keep memory sharp.   Fall Prevention in the Home Falls can cause injuries. They can happen to people of all ages. There are many things you can do to make your home safe and to help prevent falls. What can I do on the outside of my home?  Regularly fix the edges of walkways and driveways and fix any cracks.  Remove anything that might make you trip as you walk through a door, such as a raised step or threshold.  Trim any bushes or trees on the path to your home.  Use bright outdoor lighting.  Clear any walking paths of anything that might make someone trip, such as rocks or tools.  Regularly check to see if handrails are loose or broken. Make sure that both sides of any steps have handrails.  Any raised decks and porches should have guardrails on the edges.  Have any leaves, snow, or ice cleared regularly.  Use sand or salt on walking paths during winter.  Clean up any spills in your garage right away. This includes oil or grease spills. What can I do in the bathroom?  Use night lights.  Install grab bars by the toilet and in the tub and shower. Do not use towel bars as grab bars.  Use non-skid mats or decals in the tub or shower.  If you need to sit down in the shower, use a plastic, non-slip stool.  Keep the floor dry. Clean up any water that spills on the floor as soon as it happens.  Remove soap buildup in the tub or shower regularly.  Attach bath mats securely with double-sided non-slip rug tape.  Do not have throw rugs and other things on the floor that can make you trip. What can I do in the bedroom?  Use night lights.  Make sure that you have a light by your bed that is easy to reach.  Do not use any sheets or blankets that are  too big for your bed. They should not hang down onto the floor.  Have a firm chair that has side arms. You can use this for support while you get dressed.  Do not have throw rugs and other things on the floor that can make you trip. What can I do in the kitchen?  Clean up any spills right away.  Avoid walking on wet floors.  Keep items that you use a lot in easy-to-reach places.  If you need to reach something above you, use a strong step stool that has a grab bar.  Keep electrical cords out of the way.  Do not use floor polish or wax that makes floors slippery. If you must use wax, use non-skid floor wax.  Do not have throw rugs and other things on the floor that can make you trip. What can I do with my stairs?  Do not leave any items on the stairs.  Make sure that there are handrails on both sides of the stairs and use them. Fix handrails that are broken or loose. Make sure that handrails are as long as the stairways.  Check any carpeting to make sure that it is firmly attached to the stairs. Fix any carpet that is loose or worn.  Avoid having throw rugs at the top or  bottom of the stairs. If you do have throw rugs, attach them to the floor with carpet tape.  Make sure that you have a light switch at the top of the stairs and the bottom of the stairs. If you do not have them, ask someone to add them for you. What else can I do to help prevent falls?  Wear shoes that: ? Do not have high heels. ? Have rubber bottoms. ? Are comfortable and fit you well. ? Are closed at the toe. Do not wear sandals.  If you use a stepladder: ? Make sure that it is fully opened. Do not climb a closed stepladder. ? Make sure that both sides of the stepladder are locked into place. ? Ask someone to hold it for you, if possible.  Clearly mark and make sure that you can see: ? Any grab bars or handrails. ? First and last steps. ? Where the edge of each step is.  Use tools that help you move  around (mobility aids) if they are needed. These include: ? Canes. ? Walkers. ? Scooters. ? Crutches.  Turn on the lights when you go into a dark area. Replace any light bulbs as soon as they burn out.  Set up your furniture so you have a clear path. Avoid moving your furniture around.  If any of your floors are uneven, fix them.  If there are any pets around you, be aware of where they are.  Review your medicines with your doctor. Some medicines can make you feel dizzy. This can increase your chance of falling. Ask your doctor what other things that you can do to help prevent falls. This information is not intended to replace advice given to you by your health care provider. Make sure you discuss any questions you have with your health care provider. Document Released: 12/07/2008 Document Revised: 07/19/2015 Document Reviewed: 03/17/2014 Elsevier Interactive Patient Education  2018 [employer] Maintenance, Female Adopting a healthy lifestyle and getting preventive care can go a long way to promote health and wellness. Talk with your health care provider about what schedule of regular examinations is right for you. This is a good chance for you to check in with your provider about disease prevention and staying healthy. In between checkups, there are plenty of things you can do on your own. Experts have done a lot of research about which lifestyle changes and preventive measures are most likely to keep you healthy. Ask your health care provider for more information. Weight and diet Eat a healthy diet  Be sure to include plenty of vegetables, fruits, low-fat dairy products, and lean protein.  Do not eat a lot of foods high in solid fats, added sugars, or salt.  Get regular exercise. This is one of the most important things you can do for your health. ? Most adults should exercise for at least 150 minutes each week. The exercise should increase your heart rate and make you  sweat (moderate-intensity exercise). ? Most adults should also do strengthening exercises at least twice a week. This is in addition to the moderate-intensity exercise.  Maintain a healthy weight  Body mass index (BMI) is a measurement that can be used to identify possible weight problems. It estimates body fat based on height and weight. Your health care provider can help determine your BMI and help you achieve or maintain a healthy weight.  For females 75 years of age and older: ? A BMI below 18.5 is considered  underweight. ? A BMI of 18.5 to 24.9 is normal. ? A BMI of 25 to 29.9 is considered overweight. ? A BMI of 30 and above is considered obese.  Watch levels of cholesterol and blood lipids  You should start having your blood tested for lipids and cholesterol at 77 years of age, then have this test every 5 years.  You may need to have your cholesterol levels checked more often if: ? Your lipid or cholesterol levels are high. ? You are older than 77 years of age. ? You are at high risk for heart disease.  Cancer screening Lung Cancer  Lung cancer screening is recommended for adults 70-32 years old who are at high risk for lung cancer because of a history of smoking.  A yearly low-dose CT scan of the lungs is recommended for people who: ? Currently smoke. ? Have quit within the past 15 years. ? Have at least a 30-pack-year history of smoking. A pack year is smoking an average of one pack of cigarettes a day for 1 year.  Yearly screening should continue until it has been 15 years since you quit.  Yearly screening should stop if you develop a health problem that would prevent you from having lung cancer treatment.  Breast Cancer  Practice breast self-awareness. This means understanding how your breasts normally appear and feel.  It also means doing regular breast self-exams. Let your health care provider know about any changes, no matter how small.  If you are in your 20s  or 30s, you should have a clinical breast exam (CBE) by a health care provider every 1-3 years as part of a regular health exam.  If you are 70 or older, have a CBE every year. Also consider having a breast X-ray (mammogram) every year.  If you have a family history of breast cancer, talk to your health care provider about genetic screening.  If you are at high risk for breast cancer, talk to your health care provider about having an MRI and a mammogram every year.  Breast cancer gene (BRCA) assessment is recommended for women who have family members with BRCA-related cancers. BRCA-related cancers include: ? Breast. ? Ovarian. ? Tubal. ? Peritoneal cancers.  Results of the assessment will determine the need for genetic counseling and BRCA1 and BRCA2 testing.  Cervical Cancer Your health care provider may recommend that you be screened regularly for cancer of the pelvic organs (ovaries, uterus, and vagina). This screening involves a pelvic examination, including checking for microscopic changes to the surface of your cervix (Pap test). You may be encouraged to have this screening done every 3 years, beginning at age 71.  For women ages 64-65, health care providers may recommend pelvic exams and Pap testing every 3 years, or they may recommend the Pap and pelvic exam, combined with testing for human papilloma virus (HPV), every 5 years. Some types of HPV increase your risk of cervical cancer. Testing for HPV may also be done on women of any age with unclear Pap test results.  Other health care providers may not recommend any screening for nonpregnant women who are considered low risk for pelvic cancer and who do not have symptoms. Ask your health care provider if a screening pelvic exam is right for you.  If you have had past treatment for cervical cancer or a condition that could lead to cancer, you need Pap tests and screening for cancer for at least 20 years after your treatment. If Pap tests  have been discontinued, your risk factors (such as having a new sexual partner) need to be reassessed to determine if screening should resume. Some women have medical problems that increase the chance of getting cervical cancer. In these cases, your health care provider may recommend more frequent screening and Pap tests.  Colorectal Cancer  This type of cancer can be detected and often prevented.  Routine colorectal cancer screening usually begins at 77 years of age and continues through 77 years of age.  Your health care provider may recommend screening at an earlier age if you have risk factors for colon cancer.  Your health care provider may also recommend using home test kits to check for hidden blood in the stool.  A small camera at the end of a tube can be used to examine your colon directly (sigmoidoscopy or colonoscopy). This is done to check for the earliest forms of colorectal cancer.  Routine screening usually begins at age 57.  Direct examination of the colon should be repeated every 5-10 years through 77 years of age. However, you may need to be screened more often if early forms of precancerous polyps or small growths are found.  Skin Cancer  Check your skin from head to toe regularly.  Tell your health care provider about any new moles or changes in moles, especially if there is a change in a mole's shape or color.  Also tell your health care provider if you have a mole that is larger than the size of a pencil eraser.  Always use sunscreen. Apply sunscreen liberally and repeatedly throughout the day.  Protect yourself by wearing long sleeves, pants, a wide-brimmed hat, and sunglasses whenever you are outside.  Heart disease, diabetes, and high blood pressure  High blood pressure causes heart disease and increases the risk of stroke. High blood pressure is more likely to develop in: ? People who have blood pressure in the high end of the normal range (130-139/85-89 mm  Hg). ? People who are overweight or obese. ? People who are African American.  If you are 6-54 years of age, have your blood pressure checked every 3-5 years. If you are 64 years of age or older, have your blood pressure checked every year. You should have your blood pressure measured twice-once when you are at a hospital or clinic, and once when you are not at a hospital or clinic. Record the average of the two measurements. To check your blood pressure when you are not at a hospital or clinic, you can use: ? An automated blood pressure machine at a pharmacy. ? A home blood pressure monitor.  If you are between 93 years and 82 years old, ask your health care provider if you should take aspirin to prevent strokes.  Have regular diabetes screenings. This involves taking a blood sample to check your fasting blood sugar level. ? If you are at a normal weight and have a low risk for diabetes, have this test once every three years after 77 years of age. ? If you are overweight and have a high risk for diabetes, consider being tested at a younger age or more often. Preventing infection Hepatitis B  If you have a higher risk for hepatitis B, you should be screened for this virus. You are considered at high risk for hepatitis B if: ? You were born in a country where hepatitis B is common. Ask your health care provider which countries are considered high risk. ? Your parents were born  in a high-risk country, and you have not been immunized against hepatitis B (hepatitis B vaccine). ? You have HIV or AIDS. ? You use needles to inject street drugs. ? You live with someone who has hepatitis B. ? You have had sex with someone who has hepatitis B. ? You get hemodialysis treatment. ? You take certain medicines for conditions, including cancer, organ transplantation, and autoimmune conditions.  Hepatitis C  Blood testing is recommended for: ? Everyone born from [birth year] through 1966. ? Anyone with known  risk factors for hepatitis C.  Sexually transmitted infections (STIs)  You should be screened for sexually transmitted infections (STIs) including gonorrhea and chlamydia if: ? You are sexually active and are younger than 77 years of age. ? You are older than 77 years of age and your health care provider tells you that you are at risk for this type of infection. ? Your sexual activity has changed since you were last screened and you are at an increased risk for chlamydia or gonorrhea. Ask your health care provider if you are at risk.  If you do not have HIV, but are at risk, it may be recommended that you take a prescription medicine daily to prevent HIV infection. This is called pre-exposure prophylaxis (PrEP). You are considered at risk if: ? You are sexually active and do not regularly use condoms or know the HIV status of your partner(s). ? You take drugs by injection. ? You are sexually active with a partner who has HIV.  Talk with your health care provider about whether you are at high risk of being infected with HIV. If you choose to begin PrEP, you should first be tested for HIV. You should then be tested every 3 months for as long as you are taking PrEP. Pregnancy  If you are premenopausal and you may become pregnant, ask your health care provider about preconception counseling.  If you may become pregnant, take 400 to 800 micrograms (mcg) of folic acid every day.  If you want to prevent pregnancy, talk to your health care provider about birth control (contraception). Osteoporosis and menopause  Osteoporosis is a disease in which the bones lose minerals and strength with aging. This can result in serious bone fractures. Your risk for osteoporosis can be identified using a bone density scan.  If you are 29 years of age or older, or if you are at risk for osteoporosis and fractures, ask your health care provider if you should be screened.  Ask your health care provider whether you  should take a calcium or vitamin D supplement to lower your risk for osteoporosis.  Menopause may have certain physical symptoms and risks.  Hormone replacement therapy may reduce some of these symptoms and risks. Talk to your health care provider about whether hormone replacement therapy is right for you. Follow these instructions at home:  Schedule regular health, dental, and eye exams.  Stay current with your immunizations.  Do not use any tobacco products including cigarettes, chewing tobacco, or electronic cigarettes.  If you are pregnant, do not drink alcohol.  If you are breastfeeding, limit how much and how often you drink alcohol.  Limit alcohol intake to no more than 1 drink per day for nonpregnant women. One drink equals 12 ounces of beer, 5 ounces of wine, or 1 ounces of hard liquor.  Do not use street drugs.  Do not share needles.  Ask your health care provider for help if you need support or information  about quitting drugs.  Tell your health care provider if you often feel depressed.  Tell your health care provider if you have ever been abused or do not feel safe at home. This information is not intended to replace advice given to you by your health care provider. Make sure you discuss any questions you have with your health care provider. Document Released: 08/26/2010 Document Revised: 07/19/2015 Document Reviewed: 11/14/2014 Elsevier Interactive Patient Education  [employer].

## 2016-12-31 NOTE — Telephone Encounter (Signed)
[place] for GI referral- dx GERD and IBS Ok to refill Omeprazole 40mg , #90, 1 refill

## 2016-12-31 NOTE — Telephone Encounter (Signed)
Referral placed and medication filled. Pt made aware.

## 2017-01-06 ENCOUNTER — Other Ambulatory Visit: Payer: Self-pay | Admitting: Family Medicine

## 2017-01-07 NOTE — Telephone Encounter (Signed)
Last OV 07/10/16 (bruising), CPE scheduled with you 04/14/17 detrol last filled 09/12/16 #90 with 0

## 2017-01-13 ENCOUNTER — Telehealth: Payer: Self-pay | Admitting: Gastroenterology

## 2017-01-13 NOTE — Telephone Encounter (Signed)
Referral received for patient to be seen here for GERD. Patient previously saw Dr.Stark in 2009 but has since seen [facility] and [facility]. Patient wanting to return to Dr.Stark to be closer to home. Records in epic from [facility] and [facility] printed and placed on Dr.Stark's desk for review.

## 2017-01-19 NOTE — Telephone Encounter (Signed)
Dr. Fuller Plan reviewed records and has declined to accept patient. Per Dr. Fuller Plan, "given that she decided to seek GI care elsewhere after seeing me. I am not comfortable reastablishing care with her"  Left message for patient to return my call.

## 2017-04-09 ENCOUNTER — Ambulatory Visit (INDEPENDENT_AMBULATORY_CARE_PROVIDER_SITE_OTHER): Payer: Medicare Other | Admitting: Family Medicine

## 2017-04-09 ENCOUNTER — Other Ambulatory Visit: Payer: Self-pay

## 2017-04-09 ENCOUNTER — Encounter: Payer: Self-pay | Admitting: Family Medicine

## 2017-04-09 VITALS — BP 118/72 | HR 76 | Temp 99.0°F | Resp 16 | Ht 61.0 in | Wt 124.1 lb

## 2017-04-09 DIAGNOSIS — Z0000 Encounter for general adult medical examination without abnormal findings: Secondary | ICD-10-CM | POA: Diagnosis not present

## 2017-04-09 DIAGNOSIS — E785 Hyperlipidemia, unspecified: Secondary | ICD-10-CM

## 2017-04-09 DIAGNOSIS — E559 Vitamin D deficiency, unspecified: Secondary | ICD-10-CM

## 2017-04-09 LAB — LIPID PANEL
CHOLESTEROL: 233 mg/dL — AB (ref 0–200)
HDL: 90.9 mg/dL (ref >39.00)
LDL Cholesterol: 129 mg/dL — ABNORMAL HIGH (ref 0–99)
NonHDL: 142.27
TRIGLYCERIDES: 64 mg/dL (ref 0.0–149.0)
Total CHOL/HDL Ratio: 3
VLDL: 12.8 mg/dL (ref 0.0–40.0)

## 2017-04-09 LAB — CBC WITH DIFFERENTIAL/PLATELET
Basophils Absolute: 0 10*3/uL (ref 0.0–0.1)
Basophils Relative: 0.4 % (ref 0.0–3.0)
Eosinophils Absolute: 0 10*3/uL (ref 0.0–0.7)
Eosinophils Relative: 0.7 % (ref 0.0–5.0)
HCT: 40.6 % (ref 36.0–46.0)
Hemoglobin: 13.6 g/dL (ref 12.0–15.0)
LYMPHS ABS: 1.2 10*3/uL (ref 0.7–4.0)
Lymphocytes Relative: 17.7 % (ref 12.0–46.0)
MCHC: 33.5 g/dL (ref 30.0–36.0)
MCV: 91.4 fl (ref 78.0–100.0)
MONO ABS: 0.5 10*3/uL (ref 0.1–1.0)
Monocytes Relative: 7.4 % (ref 3.0–12.0)
NEUTROS PCT: 73.8 % (ref 43.0–77.0)
Neutro Abs: 4.9 10*3/uL (ref 1.4–7.7)
Platelets: 173 10*3/uL (ref 150.0–400.0)
RBC: 4.44 Mil/uL (ref 3.87–5.11)
RDW: 13.9 % (ref 11.5–15.5)
WBC: 6.7 10*3/uL (ref 4.0–10.5)

## 2017-04-09 LAB — HEPATIC FUNCTION PANEL
ALBUMIN: 4.1 g/dL (ref 3.5–5.2)
ALK PHOS: 70 U/L (ref 39–117)
ALT: 9 U/L (ref 0–35)
AST: 14 U/L (ref 0–37)
Bilirubin, Direct: 0.1 mg/dL (ref 0.0–0.3)
TOTAL PROTEIN: 6.9 g/dL (ref 6.0–8.3)
Total Bilirubin: 0.7 mg/dL (ref 0.2–1.2)

## 2017-04-09 LAB — BASIC METABOLIC PANEL
BUN: 18 mg/dL (ref 6–23)
CO2: 33 mEq/L — ABNORMAL HIGH (ref 19–32)
Calcium: 9.3 mg/dL (ref 8.4–10.5)
Chloride: 104 mEq/L (ref 96–112)
Creatinine, Ser: 0.94 mg/dL (ref 0.40–1.20)
GFR: 74.09 mL/min (ref >60.00)
Glucose, Bld: 94 mg/dL (ref 70–99)
POTASSIUM: 4.4 meq/L (ref 3.5–5.1)
Sodium: 142 mEq/L (ref 135–145)

## 2017-04-09 LAB — TSH: TSH: 2.12 u[IU]/mL (ref 0.35–4.50)

## 2017-04-09 LAB — VITAMIN D 25 HYDROXY (VIT D DEFICIENCY, FRACTURES): VITD: 36.88 ng/mL (ref 30.00–100.00)

## 2017-04-09 NOTE — Progress Notes (Signed)
   Subjective:    Patient ID: Paige Pena, female    DOB: [birth year], 77 y.o.   MRN: [id number]  HPI CPE- UTD on colonoscopy, mammo, DEXA, immunizations.     Review of Systems Patient reports no vision/ hearing changes, adenopathy,fever, weight change,  persistant/recurrent hoarseness , swallowing issues, chest pain, palpitations, edema, persistant/recurrent cough, hemoptysis, dyspnea (rest/exertional/paroxysmal nocturnal), gastrointestinal bleeding (melena, rectal bleeding), abdominal pain, significant heartburn, bowel changes, GU symptoms (dysuria, hematuria, incontinence), Gyn symptoms (abnormal  bleeding, pain),  syncope, focal weakness, memory loss, numbness & tingling, skin/hair/nail changes, abnormal bruising or bleeding, anxiety, or depression.     Objective:   Physical Exam General Appearance:    Alert, cooperative, no distress, appears stated age  Head:    Normocephalic, without obvious abnormality, atraumatic  Eyes:    PERRL, conjunctiva/corneas clear, EOM's intact, fundi    benign, both eyes  Ears:    Normal TM's and external ear canals, both ears  Nose:   Nares normal, septum midline, mucosa normal, no drainage    or sinus tenderness  Throat:   Lips, mucosa, and tongue normal; teeth and gums normal  Neck:   Supple, symmetrical, trachea midline, no adenopathy;    Thyroid: no enlargement/tenderness/nodules  Back:     Symmetric, no curvature, ROM normal, no CVA tenderness  Lungs:     Clear to auscultation bilaterally, respirations unlabored  Chest Wall:    No tenderness or deformity   Heart:    Regular rate and rhythm, S1 and S2 normal, no murmur, rub   or gallop  Breast Exam:    Deferred to mammo  Abdomen:     Soft, non-tender, bowel sounds active all four quadrants,    no masses, no organomegaly  Genitalia:    Deferred  Rectal:    Extremities:   Extremities normal, atraumatic, no cyanosis or edema  Pulses:   2+ and symmetric all extremities  Skin:   Skin color,  texture, turgor normal, no rashes or lesions  Lymph nodes:   Cervical, supraclavicular, and axillary nodes normal  Neurologic:   CNII-XII intact, normal strength, sensation and reflexes    throughout          Assessment & Plan:

## 2017-04-09 NOTE — Assessment & Plan Note (Signed)
Chronic problem.  Attempting to control w/ diet and exercise.  Check labs.  Adjust tx prn

## 2017-04-09 NOTE — Assessment & Plan Note (Signed)
Pt's PE WNL.  UTD on colonoscopy, immunizations, mammo/DEXA.  Check labs.  Anticipatory guidance provided.

## 2017-04-09 NOTE — Patient Instructions (Addendum)
Schedule your Medicare Wellness Visit for November and follow up with me in 1 year or as needed We'll notify you of your lab results and make any changes if needed Keep up the good work on healthy diet and regular exercise- you look great! Call with any questions or concerns Happy Valentine's Day!!!

## 2017-04-09 NOTE — Assessment & Plan Note (Signed)
Pt has hx of this.  Check labs and replete prn. 

## 2017-04-10 ENCOUNTER — Encounter: Payer: Self-pay | Admitting: General Practice

## 2017-04-10 NOTE — Progress Notes (Signed)
Called and left a detailed message for patient. Letter also mailed.

## 2017-05-12 ENCOUNTER — Other Ambulatory Visit: Payer: Self-pay | Admitting: Family Medicine

## 2017-05-27 ENCOUNTER — Encounter: Payer: Self-pay | Admitting: Adult Health

## 2017-05-27 ENCOUNTER — Ambulatory Visit: Payer: Medicare Other | Admitting: Adult Health

## 2017-05-27 VITALS — BP 120/66 | HR 79 | Ht 61.0 in | Wt 125.0 lb

## 2017-05-27 DIAGNOSIS — R413 Other amnesia: Secondary | ICD-10-CM

## 2017-05-27 NOTE — Progress Notes (Signed)
I have read the note, and I agree with the clinical assessment and plan.  Braylinn Gulden K Raniyah Curenton   

## 2017-05-27 NOTE — Progress Notes (Signed)
PATIENT: Paige Pena DOB: Sep 23, 1940  REASON FOR VISIT: follow up HISTORY FROM: patient  HISTORY OF PRESENT ILLNESS: Today 05/27/17 Paige Pena is a 77 year old female with a history of mild memory disturbance.  She returns today for follow-up.  The patient is currently not on any memory medication.  She continues to live at home with her husband.  She operates a Teacher, music without difficulty.  She is able to complete all ADLs independently.  Denies any trouble sleeping.  Denies any significant changes in her mood or behavior.  Denies hallucinations.  She manages her own finances.  She is able to prepare meals without difficulty.  She returns today for evaluation.  HISTORY 11/25/16 Paige Pena is a 77 year old female with a history of mild memory disturbance. She returns today for follow-up. The patient reports that she has continued to do well. She lives at home with her husband. She is able to complete all ADLs independently. She operates a Teacher, music without difficulty. She manages her own finances and balances her checkbook without difficulty. Denies any trouble sleeping with the exception she has to get up to urinate. She denies any significant changes in her mood or behavior but does note that sometimes she gets agitated easily. Overall she feels that she is doing well. She returns today for an evaluation.    REVIEW OF SYSTEMS: Out of a complete 14 system review of symptoms, the patient complains only of the following symptoms, and all other reviewed systems are negative.  ALLERGIES: Allergies  Allergen Reactions  . Morphine Other (See Comments)    REACTION: lethargy/malaise  . Metoclopramide Hcl Anxiety and Other (See Comments)    REACTION: anxious and out of her mind  . Aricept [Donepezil Hcl]     nausea  . Doxycycline Diarrhea  . Penicillins Itching and Rash    Has patient had a PCN reaction causing immediate rash, facial/tongue/throat swelling, SOB or  lightheadedness with hypotension: No Has patient had a PCN reaction causing severe rash involving mucus membranes or skin necrosis: No Has patient had a PCN reaction that required hospitalization No Has patient had a PCN reaction occurring within the last 10 years: Yes If all of the above answers are "NO", then may proceed with Cephalosporin use.    . Prednisone Itching and Rash  . Sulfonamide Derivatives Itching and Rash    HOME MEDICATIONS: Outpatient Medications Prior to Visit  Medication Sig Dispense Refill  . ALPRAZolam (XANAX) 0.5 MG tablet TAKE  1/2 -1 TABLET BY MOUTH THREE TIMES DAILY AS NEEDED (Patient not taking: Reported on 04/09/2017) 90 tablet 2  . aspirin (ECOTRIN) 325 MG EC tablet Take 325 mg by mouth daily.      . Calcium-Magnesium-Vitamin D (CALCIUM 1200+D3 PO) Take 1 tablet 2 (two) times daily by mouth.    . DULoxetine (CYMBALTA) 30 MG capsule TAKE 1 CAPSULE(30 MG) BY MOUTH DAILY 30 capsule 6  . fluticasone (FLONASE) 50 MCG/ACT nasal spray Place 2 sprays into both nostrils daily as needed for allergies. 48 g 0  . Iron-Vitamins (GERITOL) LIQD Take 15 mLs by mouth daily.    Marland Kitchen KRILL OIL OMEGA-3 PO Take 1 capsule by mouth daily.    . Melatonin 5 MG CAPS Take by mouth at bedtime.    . Multiple Vitamins-Minerals (HAIR SKIN AND NAILS FORMULA PO) Take by mouth. Collagen brand    . NON FORMULARY donmperidone    . omeprazole (PRILOSEC) 40 MG capsule TAKE 1 CAPSULE(40 MG) BY MOUTH DAILY  90 capsule 0  . prednisoLONE acetate (PRED FORTE) 1 % ophthalmic suspension Place 1 drop into both eyes daily.     Marland Kitchen tolterodine (DETROL LA) 4 MG 24 hr capsule TAKE 1 CAPSULE(4 MG) BY MOUTH AT BEDTIME 90 capsule 0   No facility-administered medications prior to visit.     PAST MEDICAL HISTORY: Past Medical History:  Diagnosis Date  . Acute cystitis   . Anxiety   . Aphasia    transsient aphasia Jan 2012  . Asthmatic bronchitis   . Cerebrovascular disease   . Chest pain, atypical   .  Diverticulosis of colon   . DJD (degenerative joint disease)   . Dysphonia   . Esophageal stricture   . Fibromyalgia   . Gastroparesis   . GERD (gastroesophageal reflux disease)   . Hematuria, microscopic   . Hemorrhoids   . Hypercholesteremia   . IBS (irritable bowel syndrome)   . Memory deficits 02/15/2013  . Memory loss   . RLS (restless legs syndrome)   . TIA (transient ischemic attack)   . Urinary incontinence   . Vitamin D deficiency     PAST SURGICAL HISTORY: Past Surgical History:  Procedure Laterality Date  . ABDOMINAL HYSTERECTOMY  1995  . corneal transplant for keratonconus    . EYE SURGERY  2013  . left foot fracture with ORIF  07/2004   Dr. Sharol Given  . right bunion surgery      FAMILY HISTORY: Family History  Problem Relation Age of Onset  . Hyperlipidemia Sister   . Diabetes Brother   . Cancer Brother        lung  . Hypertension Mother   . Anemia Mother   . Dementia Mother   . Kidney disease Father   . Breast cancer Unknown        aunt  . Diabetes Unknown        aunt,brother,uncle  . Kidney disease Unknown        father  . Dementia Maternal Grandmother     SOCIAL HISTORY: Social History   Socioeconomic History  . Marital status: Married    Spouse name: Paige Pena  . Number of children: 3  . Years of education: college-2  . Highest education level: Not on file  Occupational History    Employer: RETIRED  Social Needs  . Financial resource strain: Not on file  . Food insecurity:    Worry: Not on file    Inability: Not on file  . Transportation needs:    Medical: Not on file    Non-medical: Not on file  Tobacco Use  . Smoking status: Never Smoker  . Smokeless tobacco: Never Used  Substance and Sexual Activity  . Alcohol use: No    Alcohol/week: 0.0 oz  . Drug use: No  . Sexual activity: Not on file  Lifestyle  . Physical activity:    Days per week: Not on file    Minutes per session: Not on file  . Stress: Not on file  Relationships    . Social connections:    Talks on phone: Not on file    Gets together: Not on file    Attends religious service: Not on file    Active member of club or organization: Not on file    Attends meetings of clubs or organizations: Not on file    Relationship status: Not on file  . Intimate partner violence:    Fear of current or ex partner: Not on file  Emotionally abused: Not on file    Physically abused: Not on file    Forced sexual activity: Not on file  Other Topics Concern  . Not on file  Social History Narrative   Lennie Hummer   Avery, daughter   Beyonce Sawatzky info release form to share her medical records with her children   Lives at home w/ her husband   Right-handed   Caffeine: tea on occasion      PHYSICAL EXAM  Vitals:   05/27/17 1057  Height: 5\' 1"  (1.549 m)   Body mass index is 23.45 kg/m.  Generalized: Well developed, in no acute distress   Neurological examination  Mentation: Alert oriented to time, place, history taking. Follows all commands speech and language fluent Cranial nerve II-XII: Pupils were equal round reactive to light. Extraocular movements were full, visual field were full on confrontational test. Facial sensation and strength were normal. Uvula tongue midline. Head turning and shoulder shrug  were normal and symmetric. Motor: The motor testing reveals 5 over 5 strength of all 4 extremities. Good symmetric motor tone is noted throughout.  Sensory: Sensory testing is intact to soft touch on all 4 extremities. No evidence of extinction is noted.  Coordination: Cerebellar testing reveals good finger-nose-finger and heel-to-shin bilaterally.  Gait and station: Gait is normal. Tandem gait is normal. Romberg is negative. No drift is seen.  Reflexes: Deep tendon reflexes are symmetric and normal bilaterally.   DIAGNOSTIC DATA (LABS, IMAGING, TESTING) - I reviewed patient records, labs, notes, testing and imaging  myself where available.  Lab Results  Component Value Date   WBC 6.7 04/09/2017   HGB 13.6 04/09/2017   HCT 40.6 04/09/2017   MCV 91.4 04/09/2017   PLT 173.0 04/09/2017      Component Value Date/Time   NA 142 04/09/2017 1403   K 4.4 04/09/2017 1403   CL 104 04/09/2017 1403   CO2 33 (H) 04/09/2017 1403   GLUCOSE 94 04/09/2017 1403   BUN 18 04/09/2017 1403   CREATININE 0.94 04/09/2017 1403   CREATININE 0.95 (H) 06/04/2016 1534   CALCIUM 9.3 04/09/2017 1403   PROT 6.9 04/09/2017 1403   ALBUMIN 4.1 04/09/2017 1403   AST 14 04/09/2017 1403   ALT 9 04/09/2017 1403   ALKPHOS 70 04/09/2017 1403   BILITOT 0.7 04/09/2017 1403   GFRNONAA 54 (L) 05/07/2015 2203   GFRAA >60 05/07/2015 2203   Lab Results  Component Value Date   CHOL 233 (H) 04/09/2017   HDL 90.90 04/09/2017   LDLCALC 129 (H) 04/09/2017   LDLDIRECT 110.7 03/07/2013   TRIG 64.0 04/09/2017   CHOLHDL 3 04/09/2017   Lab Results  Component Value Date   HGBA1C 5.6 03/21/2015   Lab Results  Component Value Date   VITAMINB12 412 09/16/2010   Lab Results  Component Value Date   TSH 2.12 04/09/2017      ASSESSMENT AND PLAN 77 y.o. year old female  has a past medical history of Acute cystitis, Anxiety, Aphasia, Asthmatic bronchitis, Cerebrovascular disease, Chest pain, atypical, Diverticulosis of colon, DJD (degenerative joint disease), Dysphonia, Esophageal stricture, Fibromyalgia, Gastroparesis, GERD (gastroesophageal reflux disease), Hematuria, microscopic, Hemorrhoids, Hypercholesteremia, IBS (irritable bowel syndrome), Memory deficits (02/15/2013), Memory loss, RLS (restless legs syndrome), TIA (transient ischemic attack), Urinary incontinence, and Vitamin D deficiency. here with:  1. Memory disturbance  The patient's memory score has remained stable.  MMSE today is 29 out of 30.  We will continue to monitor the patient's memory.  Advised  that if her symptoms worsen or she develops new symptoms she should let us  know.  We will follow-up in 1 year or sooner if needed.  I spent 15 minutes with the patient. 50% of this time was spent discussing her memory score   Ward Givens, MSN, NP-C 05/27/2017, 11:01 AM [facility] [address], [place], [place] [address] [phone]

## 2017-05-27 NOTE — Patient Instructions (Signed)
Your Plan:  Continue to monitor symptoms If your symptoms worsen or you develop new symptoms please let us know.   Thank you for coming to see us at [facility]. I hope we have been able to provide you high quality care today.  You may receive a patient satisfaction survey over the next few weeks. We would appreciate your feedback and comments so that we may continue to improve ourselves and the health of our patients.   

## 2017-06-19 ENCOUNTER — Other Ambulatory Visit: Payer: Self-pay | Admitting: Family Medicine

## 2017-08-03 ENCOUNTER — Ambulatory Visit: Payer: Self-pay | Admitting: *Deleted

## 2017-08-03 ENCOUNTER — Other Ambulatory Visit: Payer: Self-pay

## 2017-08-03 DIAGNOSIS — R159 Full incontinence of feces: Secondary | ICD-10-CM

## 2017-08-03 DIAGNOSIS — K921 Melena: Secondary | ICD-10-CM

## 2017-08-03 NOTE — Telephone Encounter (Signed)
Pt saw [facility] in February of this year.  Based on her sxs, she should call them immediately.  If sxs change or worsen, she needs to go to the ER

## 2017-08-03 NOTE — Telephone Encounter (Signed)
Called patient and she stated that she wants an GI referral to Dr. Dannielle Huh with [facility] and that she needs someone closer since she is not able to go to the [place] office at this time. Please advise if okay to place referral.  I have advised the patient of Dr. Virgil Benedict recommendations and she verbally stated that she understood and I also advised her to go to the ER if her symptoms are worse or if she is not able to make it to [facility] if they advise her to come.  Patient verbalized an understanding.

## 2017-08-03 NOTE — Telephone Encounter (Signed)
[facility] referral has been placed for Dr. Dannielle Huh.  Called patient to let her know that this referral has been sent and hopefully she will receive a call soon for an appointment.

## 2017-08-03 NOTE — Telephone Encounter (Signed)
Pt reports 2 episodes of blood in stools over 1 week. Last episode Friday 07/31/17. States dark red in commode ,"None on paper." Reports normal pattern 2x's daily. Blood has occurred only with 2 episodes. Also reports lower abdomen "Discomfort, not pain." States "Tender to touch" intermittent. Reports excessive belching, "Feel gassy." History of fecal incontinence but states "More frequent." Also states increased fatigue, loss of appetite. States is staying hydrated. Reports intermittent pain "Between shoulder blades , upper back at times." Pt calling for GI referral. Reviewed process for referral to pt; verbalizes understanding. Appt made with Dr.Tabori for tomorrow 08/04/17. Care advise given per protocol. Instructed to call back if symptoms worsen.  Reason for Disposition . MILD rectal bleeding (more than just a few drops or streaks)    Two episodes over past week.  Answer Assessment - Initial Assessment Questions 1. APPEARANCE of BLOOD: "What color is it?" "Is it passed separately, on the surface of the stool, or mixed in with the stool?"     Dark red, in water only 2. AMOUNT: "How much blood was passed?"      unsure 3. FREQUENCY: "How many times has blood been passed with the stools?"      2 times 4. ONSET: "When was the blood first seen in the stools?" (Days or weeks)      Last Friday and 1 week ago 5. DIARRHEA: "Is there also some diarrhea?" If so, ask: "How many diarrhea stools were passed in past 24 hours?"      Not loose, normal. 6. CONSTIPATION: "Do you have constipation?" If so, "How bad is it?"     no 7. RECURRENT SYMPTOMS: "Have you had blood in your stools before?" If so, ask: "When was the last time?" and "What happened that time?"      1 week ago 8. BLOOD THINNERS: "Do you take any blood thinners?" (e.g., Coumadin/warfarin, Pradaxa/dabigatran, aspirin)      9. OTHER SYMPTOMS: "Do you have any other symptoms?"  (e.g., abdominal pain, vomiting, dizziness, fever)    Lower abdomen,  tender to touch. Pain between shoulder blades, intermittent Weak, no energy, decreased appetite. H/O fecal incontinence  Protocols used: RECTAL BLEEDING-A-AH

## 2017-08-03 NOTE — Telephone Encounter (Signed)
Ok to refer to [facility], Dr Georga Hacking dx- fecal incontinence, blood in stool

## 2017-08-04 ENCOUNTER — Ambulatory Visit: Payer: Medicare Other | Admitting: Family Medicine

## 2017-08-10 LAB — HM COLONOSCOPY

## 2017-08-21 ENCOUNTER — Encounter: Payer: Self-pay | Admitting: General Practice

## 2017-09-09 ENCOUNTER — Other Ambulatory Visit: Payer: Self-pay | Admitting: Family Medicine

## 2017-10-01 ENCOUNTER — Other Ambulatory Visit: Payer: Self-pay | Admitting: General Practice

## 2017-10-01 ENCOUNTER — Ambulatory Visit: Payer: Self-pay | Admitting: Family Medicine

## 2017-10-01 ENCOUNTER — Ambulatory Visit (INDEPENDENT_AMBULATORY_CARE_PROVIDER_SITE_OTHER): Payer: Medicare Other | Admitting: Family Medicine

## 2017-10-01 ENCOUNTER — Other Ambulatory Visit: Payer: Self-pay | Admitting: Family Medicine

## 2017-10-01 ENCOUNTER — Encounter: Payer: Self-pay | Admitting: Family Medicine

## 2017-10-01 ENCOUNTER — Other Ambulatory Visit: Payer: Self-pay

## 2017-10-01 DIAGNOSIS — S0990XA Unspecified injury of head, initial encounter: Secondary | ICD-10-CM

## 2017-10-01 DIAGNOSIS — M62838 Other muscle spasm: Secondary | ICD-10-CM

## 2017-10-01 MED ORDER — TIZANIDINE HCL 2 MG PO TABS: ORAL_TABLET | ORAL | 0 refills | Status: DC

## 2017-10-01 MED ORDER — TOLTERODINE TARTRATE ER 4 MG PO CP24: ORAL_CAPSULE | ORAL | 0 refills | Status: DC

## 2017-10-01 NOTE — Telephone Encounter (Signed)
FYI
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
## 2017-10-01 NOTE — Patient Instructions (Signed)
Follow up as needed or as scheduled START the Tizanidine as needed for spasm- may cause drowsiness USE a heating pad on the neck and shoulders Tylenol as needed for headache REST! Try and avoid straining your brain- limit computer/screen time.  You may also find reading difficult- this is normal Call with any questions or concerns Hang in there!!!
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
## 2017-10-01 NOTE — Telephone Encounter (Signed)
Please advise 

## 2017-10-02 ENCOUNTER — Ambulatory Visit: Payer: Self-pay | Admitting: Family Medicine

## 2017-10-08 ENCOUNTER — Other Ambulatory Visit: Payer: Self-pay | Admitting: Family Medicine

## 2017-10-12 ENCOUNTER — Other Ambulatory Visit: Payer: Self-pay | Admitting: Family Medicine

## 2017-10-12 NOTE — Telephone Encounter (Signed)
Please advise, pt was given #30 with 0. Ok to refill?

## 2017-10-14 ENCOUNTER — Ambulatory Visit: Payer: Self-pay | Admitting: *Deleted
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
## 2017-10-15 NOTE — Telephone Encounter (Signed)
Next available appt is Friday, should that be ok, or bring in today.   Please advise.  Doloris Hall,  LPN

## 2017-10-15 NOTE — Telephone Encounter (Signed)
Ok to see on Friday

## 2017-10-15 NOTE — Telephone Encounter (Signed)
Appt scheduled for 10/16/2017

## 2017-10-16 ENCOUNTER — Other Ambulatory Visit: Payer: Self-pay

## 2017-10-16 ENCOUNTER — Ambulatory Visit (INDEPENDENT_AMBULATORY_CARE_PROVIDER_SITE_OTHER): Payer: Medicare Other | Admitting: Physician Assistant

## 2017-10-16 ENCOUNTER — Telehealth: Payer: Self-pay | Admitting: Family Medicine

## 2017-10-16 ENCOUNTER — Telehealth: Payer: Self-pay | Admitting: Emergency Medicine

## 2017-10-16 ENCOUNTER — Encounter: Payer: Self-pay | Admitting: Physician Assistant

## 2017-10-16 ENCOUNTER — Ambulatory Visit: Payer: Medicare Other | Admitting: Family Medicine

## 2017-10-16 VITALS — BP 120/70 | HR 69 | Temp 98.0°F | Resp 14 | Ht 61.0 in | Wt 126.0 lb

## 2017-10-16 DIAGNOSIS — M62838 Other muscle spasm: Secondary | ICD-10-CM | POA: Diagnosis not present

## 2017-10-16 DIAGNOSIS — F0781 Postconcussional syndrome: Secondary | ICD-10-CM
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
## 2017-10-16 NOTE — Telephone Encounter (Signed)
Handled at her office visit.

## 2017-10-16 NOTE — Telephone Encounter (Signed)
Pt would like an order for physical therapy for back and shoulder pain. Please refer and order and call pt. Ph [phone].

## 2017-10-16 NOTE — Telephone Encounter (Signed)
Patient called to reschedule and is going to see Einar Pheasant today.

## 2017-10-16 NOTE — Telephone Encounter (Signed)
Copied from [place] [id number]. Topic: Quick Communication - Patient Running Late >> Oct 16, 2017  9:54 AM Margot Ables wrote: Patient called and is running 5-10 minutes late.  Route to department's PEC pool.

## 2017-10-16 NOTE — Patient Instructions (Addendum)
Please continue Tizanidine as directed when at home resting. When you apply the heating pad, do so only for 15-20 minutes at a time.  Tylenol every 6 hours if needed for pain. I am setting you up for physical therapy to help with your lower back and neck. Apply some Icy Hot to the neck.     Post-Concussion Syndrome Post-concussion syndrome is the symptoms that can occur after a head injury. These symptoms can last from weeks to months. Follow these instructions at home:  Take medicines only as told by your doctor.  Do not take aspirin.  Sleep with your head raised to help with headaches.  Avoid activities that can cause another head injury. ? Do not play contact sports like football, hockey, soccer, or basketball. ? Do not do other risky activities like downhill skiing, martial arts, or horseback riding until your doctor says it is okay.  Keep all follow-up visits as told by your doctor. This is important. Contact a doctor if:  You have a harder time: ? Paying attention. ? Focusing. ? Remembering. ? Learning new information. ? Dealing with stress.  You need more time to complete tasks.  You are easily bothered (irritable).  You have more symptoms. Get help if you have any of these symptoms for more than two weeks after your injury:  Long-lasting (chronic) headaches.  Dizziness.  Trouble balancing.  Feeling sick to your stomach (nauseous).  Trouble with your vision.  Noise or light bothers you more.  Depression.  Mood swings.  Feeling worried (anxious).  Easily bothered.  Memory problems.  Trouble concentrating or paying attention.  Sleep problems.  Feeling tired all of the time.  Get help right away if:  You feel confused.  You feel very sleepy.  You are hard to wake up.  You feel sick to your stomach.  You keep throwing up (vomiting).  You feel like you are moving when you are not (vertigo).  Your eyes move back and forth very  quickly.  You start shaking (convulsing) or pass out (faint).  You have very bad headaches that do not get better with medicine.  You cannot use your arms or legs like normal.  One of the black centers of your eyes (pupils) is bigger than the other.  You have clear or bloody fluid coming from your nose or ears.  Your problems get worse, not better. This information is not intended to replace advice given to you by your health care provider. Make sure you discuss any questions you have with your health care provider. Document Released: 03/20/2004 Document Revised: 07/19/2015 Document Reviewed: 05/18/2013 Elsevier Interactive Patient Education  2018 [employer].

## 2017-11-30 ENCOUNTER — Ambulatory Visit: Payer: Medicare Other | Attending: Physician Assistant | Admitting: Physical Therapy

## 2017-11-30 ENCOUNTER — Encounter: Payer: Self-pay | Admitting: Physical Therapy

## 2017-11-30 DIAGNOSIS — M62830 Muscle spasm of back: Secondary | ICD-10-CM | POA: Insufficient documentation

## 2017-11-30 DIAGNOSIS — M546 Pain in thoracic spine: Secondary | ICD-10-CM | POA: Diagnosis present

## 2017-11-30 DIAGNOSIS — M542 Cervicalgia: Secondary | ICD-10-CM | POA: Insufficient documentation
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
## 2017-12-01 LAB — HM MAMMOGRAPHY

## 2017-12-02 ENCOUNTER — Encounter: Payer: Self-pay | Admitting: General Practice

## 2017-12-02 ENCOUNTER — Ambulatory Visit: Payer: Medicare Other | Admitting: Physical Therapy

## 2017-12-07 ENCOUNTER — Ambulatory Visit: Payer: Medicare Other | Admitting: Physical Therapy

## 2017-12-07 ENCOUNTER — Encounter: Payer: Self-pay | Admitting: Physical Therapy

## 2017-12-07 DIAGNOSIS — M62830 Muscle spasm of back: Secondary | ICD-10-CM

## 2017-12-07 DIAGNOSIS — M542 Cervicalgia: Secondary | ICD-10-CM | POA: Diagnosis not present

## 2017-12-07 DIAGNOSIS — M546 Pain in thoracic spine: Secondary | ICD-10-CM

## 2017-12-07 NOTE — Therapy (Signed)
[place], [place], [address] Phone: [phone]   Fax:  [phone]  Physical Therapy Treatment  Patient Details  Name: Paige Pena MRN: [id number] Date of Birth: 1940/09/08 Referring Provider (PT): Nche   Encounter Date: 12/07/2017  PT End of Session - 12/07/17 1517    Visit Number  2    PT Start Time  6606    PT Stop Time  3016    PT Time Calculation (min)  49 min       Past Medical History:  Diagnosis Date  . Acute cystitis   . Anxiety   . Aphasia    transsient aphasia Jan 2012  . Asthmatic bronchitis   . Cerebrovascular disease   . Chest pain, atypical   . Diverticulosis of colon   . DJD (degenerative joint disease)   . Dysphonia   . Esophageal stricture   . Fibromyalgia   . Gastroparesis   . GERD (gastroesophageal reflux disease)   . Hematuria, microscopic   . Hemorrhoids   . Hypercholesteremia   . IBS (irritable bowel syndrome)   . Memory deficits 02/15/2013  . Memory loss   . RLS (restless legs syndrome)   . TIA (transient ischemic attack)   . Urinary incontinence   . Vitamin D deficiency     Past Surgical History:  Procedure Laterality Date  . ABDOMINAL HYSTERECTOMY  1995  . corneal transplant for keratonconus    . EYE SURGERY  2013  . left foot fracture with ORIF  07/2004   Dr. Sharol Given  . right bunion surgery      There were no vitals filed for this visit.  Subjective Assessment - 12/07/17 1439    Subjective  "Tired"    Currently in Pain?  No/denies                       [name] Adult PT Treatment/Exercise - 12/07/17 0001      Exercises   Exercises  Neck;Lumbar      Neck Exercises: Standing   Other Standing Exercises  Shoulder flex and abd 1lb 2x10 each     Other Standing Exercises  shrugs 3lb 2x10       Neck Exercises: Seated   Other Seated Exercise  Rows green Tband 2x10       Lumbar Exercises: Aerobic   Nustep  L3 x 6 min        Lumbar Exercises: Standing   Shoulder Extension  10 reps;Theraband;Both    Theraband Level (Shoulder Extension)  Level 3 (Green)      Modalities   Modalities  Moist Heat      Moist Heat Therapy   Number Minutes Moist Heat  10 Minutes    Moist Heat Location  Cervical               PT Short Term Goals - 12/07/17 1516      PT SHORT TERM GOAL #1   Title  Pt will be independent with HEP    Status  Achieved        PT Long Term Goals - 11/30/17 1510      PT LONG TERM GOAL #1   Title  decrease pain 50%    Time  8    Period  Weeks    Status  New      PT LONG TERM GOAL #2   Title  understand posture and body mechanics  Time  8    Period  Weeks    Status  New      PT LONG TERM GOAL #3   Title  increase cervical ROM 25%    Time  8    Period  Weeks    Status  New      PT LONG TERM GOAL #4   Title  increase lumbar ROM 25%    Time  8    Period  Weeks    Status  New      PT LONG TERM GOAL #5   Title  report able to go shopping without difficulty    Time  8    Period  Weeks    Status  New            Plan - 12/07/17 1518    Clinical Impression Statement  Pt ~ 8 minutes late for today's treatment session. She does report a little stretch on her back with NuStep aerobic warm up. All exercises completed well with no reports of increase pain. Upper Levater and STCM tightness noted on the R side with MT.     PT Frequency  2x / week    PT Duration  8 weeks    PT Treatment/Interventions  ADLs/Self Care Home Management;Cryotherapy;Electrical Stimulation;Moist Heat;Traction;Therapeutic activities;Therapeutic exercise;Neuromuscular re-education;Manual techniques;Patient/family education;Dry needling    PT Next Visit Plan  slowly start gym exercises treat spasms and pain as needed       Patient will benefit from skilled therapeutic intervention in order to improve the following deficits and impairments:  Decreased range of motion, Increased muscle spasms, Pain,  Impaired flexibility, Improper body mechanics, Postural dysfunction, Decreased strength  Visit Diagnosis: Cervicalgia  Pain in thoracic spine  Muscle spasm of back     Problem List Patient Active Problem List   Diagnosis Date Noted  . HLD (hyperlipidemia)   . Overactive bladder 05/08/2015  . Aphasia   . Cervical spondylitis ([place])   . Numbness and tingling in left hand 03/21/2015  . TIA (transient ischemic attack) 03/21/2015  . Physical exam 10/05/2014  . Abdominal pain 05/17/2014  . Allergic rhinitis 05/17/2014  . Anxiety and depression 07/25/2013  . Memory deficits 02/15/2013  . Lumbar spondylosis 09/21/2012  . Palpitations 09/16/2010  . Cerebrovascular disease 04/03/2008  . DYSPHONIA 04/03/2008  . Diverticulosis of large intestine 04/02/2008  . Irritable bowel syndrome 04/02/2008  . DEGENERATIVE JOINT DISEASE 04/02/2008  . HEMATURIA, MICROSCOPIC, HX OF 04/02/2008  . Vitamin D deficiency 03/28/2008  . FIBROMYALGIA 03/28/2008  . URINARY INCONTINENCE 03/28/2008  . GERD 08/02/2007  . Gastroparesis 08/02/2007  . HEMORRHOIDS 07/30/2007  . ESOPHAGEAL STRICTURE 07/30/2007    Scot Jun, PTA 12/07/2017, 3:24 PM  [place], [place], [address] Phone: [phone]   Fax:  [phone]  Name: Paige Pena MRN: [id number] Date of Birth: 1941-02-21

## 2017-12-11 ENCOUNTER — Ambulatory Visit: Payer: Medicare Other | Admitting: Physical Therapy

## 2017-12-14 ENCOUNTER — Ambulatory Visit: Payer: Medicare Other | Admitting: Physical Therapy

## 2017-12-17 ENCOUNTER — Ambulatory Visit: Payer: Medicare Other | Admitting: Physical Therapy

## 2017-12-17 DIAGNOSIS — M546 Pain in thoracic spine: Secondary | ICD-10-CM

## 2017-12-17 DIAGNOSIS — M62830 Muscle spasm of back: Secondary | ICD-10-CM

## 2017-12-17 DIAGNOSIS — M542 Cervicalgia: Secondary | ICD-10-CM

## 2017-12-17 NOTE — Therapy (Signed)
[place], [place], [address] Phone: [phone]   Fax:  [phone]  Physical Therapy Treatment  Patient Details  Name: Paige Pena MRN: [id number] Date of Birth: 12-Jan-1941 Referring Provider (PT): Nche   Encounter Date: 12/17/2017  PT End of Session - 12/17/17 1520    Visit Number  3    PT Start Time  1440    PT Stop Time  1515    PT Time Calculation (min)  35 min    Activity Tolerance  Patient tolerated treatment well    Behavior During Therapy  [facility] for tasks assessed/performed       Past Medical History:  Diagnosis Date  . Acute cystitis   . Anxiety   . Aphasia    transsient aphasia Jan 2012  . Asthmatic bronchitis   . Cerebrovascular disease   . Chest pain, atypical   . Diverticulosis of colon   . DJD (degenerative joint disease)   . Dysphonia   . Esophageal stricture   . Fibromyalgia   . Gastroparesis   . GERD (gastroesophageal reflux disease)   . Hematuria, microscopic   . Hemorrhoids   . Hypercholesteremia   . IBS (irritable bowel syndrome)   . Memory deficits 02/15/2013  . Memory loss   . RLS (restless legs syndrome)   . TIA (transient ischemic attack)   . Urinary incontinence   . Vitamin D deficiency     Past Surgical History:  Procedure Laterality Date  . ABDOMINAL HYSTERECTOMY  1995  . corneal transplant for keratonconus    . EYE SURGERY  2013  . left foot fracture with ORIF  07/2004   Dr. Sharol Given  . right bunion surgery      There were no vitals filed for this visit.  Subjective Assessment - 12/17/17 1445    Subjective  Pt reports fatigue. She stated that this has been going on for two weeks    Currently in Pain?  Yes    Pain Score  7     Pain Location  Back                       [facility] Adult PT Treatment/Exercise - 12/17/17 0001      Exercises   Exercises  Neck;Lumbar      Neck Exercises: Standing   Other Standing Exercises   Shoulder ER red 2x10    Other Standing Exercises  shrugs 3lb 2x10       Lumbar Exercises: Stretches   Passive Hamstring Stretch  Left;Right;4 reps;10 seconds    Single Knee to Chest Stretch  Right;Left;3 reps;10 seconds    Double Knee to Chest Stretch  2 reps;10 seconds      Lumbar Exercises: Aerobic   Nustep  L3 x 6 min       Lumbar Exercises: Standing   Row  10 reps;Theraband;Both;Strengthening   x2   Theraband Level (Row)  Level 3 (Green)    Shoulder Extension  10 reps;Theraband;Both   x2   Theraband Level (Shoulder Extension)  Level 3 Nyoka Cowden)               PT Short Term Goals - 12/07/17 1516      PT SHORT TERM GOAL #1   Title  Pt will be independent with HEP    Status  Achieved        PT Long Term Goals - 11/30/17 1510  PT LONG TERM GOAL #1   Title  decrease pain 50%    Time  8    Period  Weeks    Status  New      PT LONG TERM GOAL #2   Title  understand posture and body mechanics    Time  8    Period  Weeks    Status  New      PT LONG TERM GOAL #3   Title  increase cervical ROM 25%    Time  8    Period  Weeks    Status  New      PT LONG TERM GOAL #4   Title  increase lumbar ROM 25%    Time  8    Period  Weeks    Status  New      PT LONG TERM GOAL #5   Title  report able to go shopping without difficulty    Time  8    Period  Weeks    Status  New            Plan - 12/17/17 1522    Clinical Impression Statement  Pt ~ 10 minutes late for today's treatment session. She moves around and does the exercises well despite high pain rating. No issues with today's exercises, she does reports some tenderness in her upper traps with palpation.      PT Frequency  2x / week    PT Duration  8 weeks    PT Treatment/Interventions  ADLs/Self Care Home Management;Cryotherapy;Electrical Stimulation;Moist Heat;Traction;Therapeutic activities;Therapeutic exercise;Neuromuscular re-education;Manual techniques;Patient/family education;Dry needling     PT Next Visit Plan   gym exercises treat spasms and pain as needed       Patient will benefit from skilled therapeutic intervention in order to improve the following deficits and impairments:  Decreased range of motion, Increased muscle spasms, Pain, Impaired flexibility, Improper body mechanics, Postural dysfunction, Decreased strength  Visit Diagnosis: Cervicalgia  Pain in thoracic spine  Muscle spasm of back     Problem List Patient Active Problem List   Diagnosis Date Noted  . HLD (hyperlipidemia)   . Overactive bladder 05/08/2015  . Aphasia   . Cervical spondylitis ([place])   . Numbness and tingling in left hand 03/21/2015  . TIA (transient ischemic attack) 03/21/2015  . Physical exam 10/05/2014  . Abdominal pain 05/17/2014  . Allergic rhinitis 05/17/2014  . Anxiety and depression 07/25/2013  . Memory deficits 02/15/2013  . Lumbar spondylosis 09/21/2012  . Palpitations 09/16/2010  . Cerebrovascular disease 04/03/2008  . DYSPHONIA 04/03/2008  . Diverticulosis of large intestine 04/02/2008  . Irritable bowel syndrome 04/02/2008  . DEGENERATIVE JOINT DISEASE 04/02/2008  . HEMATURIA, MICROSCOPIC, HX OF 04/02/2008  . Vitamin D deficiency 03/28/2008  . FIBROMYALGIA 03/28/2008  . URINARY INCONTINENCE 03/28/2008  . GERD 08/02/2007  . Gastroparesis 08/02/2007  . HEMORRHOIDS 07/30/2007  . ESOPHAGEAL STRICTURE 07/30/2007    Scot Jun, PTA 12/17/2017, 3:28 PM  [place], [place], [address] Phone: [phone]   Fax:  [phone]  Name: Paige Pena MRN: [id number] Date of Birth: Sep 25, 1940

## 2017-12-22 ENCOUNTER — Encounter: Payer: Self-pay | Admitting: Family Medicine

## 2017-12-23 ENCOUNTER — Encounter: Payer: Self-pay | Admitting: Physical Therapy

## 2017-12-23 ENCOUNTER — Ambulatory Visit: Payer: Medicare Other | Admitting: Physical Therapy

## 2017-12-23 DIAGNOSIS — M542 Cervicalgia: Secondary | ICD-10-CM

## 2017-12-23 DIAGNOSIS — M546 Pain in thoracic spine: Secondary | ICD-10-CM

## 2017-12-23 DIAGNOSIS — M62830 Muscle spasm of back: Secondary | ICD-10-CM

## 2017-12-23 NOTE — Therapy (Signed)
[place], [place], [address] Phone: [phone]   Fax:  [phone]  Physical Therapy Treatment  Patient Details  Name: Paige Pena MRN: [id number] Date of Birth: December 13, 1940 Referring Provider (PT): Nche   Encounter Date: 12/23/2017  PT End of Session - 12/23/17 1519    Visit Number  4    Date for PT Re-Evaluation  01/30/18    PT Start Time  1444    PT Stop Time  1515    PT Time Calculation (min)  31 min    Activity Tolerance  Patient tolerated treatment well    Behavior During Therapy  [facility] for tasks assessed/performed       Past Medical History:  Diagnosis Date  . Acute cystitis   . Anxiety   . Aphasia    transsient aphasia Jan 2012  . Asthmatic bronchitis   . Cerebrovascular disease   . Chest pain, atypical   . Diverticulosis of colon   . DJD (degenerative joint disease)   . Dysphonia   . Esophageal stricture   . Fibromyalgia   . Gastroparesis   . GERD (gastroesophageal reflux disease)   . Hematuria, microscopic   . Hemorrhoids   . Hypercholesteremia   . IBS (irritable bowel syndrome)   . Memory deficits 02/15/2013  . Memory loss   . RLS (restless legs syndrome)   . TIA (transient ischemic attack)   . Urinary incontinence   . Vitamin D deficiency     Past Surgical History:  Procedure Laterality Date  . ABDOMINAL HYSTERECTOMY  1995  . corneal transplant for keratonconus    . EYE SURGERY  2013  . left foot fracture with ORIF  07/2004   Dr. Sharol Given  . right bunion surgery      There were no vitals filed for this visit.  Subjective Assessment - 12/23/17 1448    Subjective  "I am feeling a seven near my waste line in the back"    Currently in Pain?  Yes    Pain Score  7     Pain Location  Back                       [facility] Adult PT Treatment/Exercise - 12/23/17 0001      Exercises   Exercises  Neck;Lumbar      Lumbar Exercises: Aerobic   UBE  (Upper Arm Bike)  L1 3 min each      Lumbar Exercises: Machines for Strengthening   Other Lumbar Machine Exercise  Rows & lats 15lb 2x10      Lumbar Exercises: Standing   Other Standing Lumbar Exercises  ER yelow tband 2x10       Lumbar Exercises: Supine   Other Supine Lumbar Exercises  Pball bridges, K2C, Small rotations.               PT Short Term Goals - 12/07/17 1516      PT SHORT TERM GOAL #1   Title  Pt will be independent with HEP    Status  Achieved        PT Long Term Goals - 12/23/17 1519      PT LONG TERM GOAL #1   Title  decrease pain 50%    Status  On-going      PT LONG TERM GOAL #2   Title  understand posture and body mechanics    Status  Partially Met  PT LONG TERM GOAL #3   Title  increase cervical ROM 25%    Status  On-going      PT LONG TERM GOAL #5   Title  report able to go shopping without difficulty    Status  Partially Met            Plan - 12/23/17 1520    Clinical Impression Statement  PT ~ 14 minutes late for therapy. Despite hight pain rating her was able to complete all of the exercises well. Pt appeared to have no issues ambulating around clinic and getting on and off machines. Pt displayed some core weakness with isometric ab sets.     Rehab Potential  Good    PT Frequency  2x / week    PT Duration  8 weeks    PT Treatment/Interventions  ADLs/Self Care Home Management;Cryotherapy;Electrical Stimulation;Moist Heat;Traction;Therapeutic activities;Therapeutic exercise;Neuromuscular re-education;Manual techniques;Patient/family education;Dry needling    PT Next Visit Plan   gym exercises treat spasms and pain as needed       Patient will benefit from skilled therapeutic intervention in order to improve the following deficits and impairments:  Decreased range of motion, Increased muscle spasms, Pain, Impaired flexibility, Improper body mechanics, Postural dysfunction, Decreased strength  Visit  Diagnosis: Cervicalgia  Pain in thoracic spine  Muscle spasm of back     Problem List Patient Active Problem List   Diagnosis Date Noted  . HLD (hyperlipidemia)   . Overactive bladder 05/08/2015  . Aphasia   . Cervical spondylitis ([place])   . Numbness and tingling in left hand 03/21/2015  . TIA (transient ischemic attack) 03/21/2015  . Physical exam 10/05/2014  . Abdominal pain 05/17/2014  . Allergic rhinitis 05/17/2014  . Anxiety and depression 07/25/2013  . Memory deficits 02/15/2013  . Lumbar spondylosis 09/21/2012  . Palpitations 09/16/2010  . Cerebrovascular disease 04/03/2008  . DYSPHONIA 04/03/2008  . Diverticulosis of large intestine 04/02/2008  . Irritable bowel syndrome 04/02/2008  . DEGENERATIVE JOINT DISEASE 04/02/2008  . HEMATURIA, MICROSCOPIC, HX OF 04/02/2008  . Vitamin D deficiency 03/28/2008  . FIBROMYALGIA 03/28/2008  . URINARY INCONTINENCE 03/28/2008  . GERD 08/02/2007  . Gastroparesis 08/02/2007  . HEMORRHOIDS 07/30/2007  . ESOPHAGEAL STRICTURE 07/30/2007    Scot Jun, PTA 12/23/2017, 3:27 PM  [place], [place], [address] Phone: [phone]   Fax:  [phone]  Name: Paige Pena MRN: [id number] Date of Birth: May 29, 1940

## 2017-12-24 ENCOUNTER — Telehealth: Payer: Self-pay | Admitting: General Practice

## 2017-12-24 ENCOUNTER — Ambulatory Visit: Payer: Self-pay | Admitting: Family Medicine
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
## 2017-12-25 ENCOUNTER — Ambulatory Visit (INDEPENDENT_AMBULATORY_CARE_PROVIDER_SITE_OTHER): Payer: Medicare Other | Admitting: Family Medicine

## 2017-12-25 ENCOUNTER — Other Ambulatory Visit: Payer: Self-pay

## 2017-12-25 ENCOUNTER — Encounter: Payer: Self-pay | Admitting: Family Medicine

## 2017-12-25 ENCOUNTER — Ambulatory Visit (HOSPITAL_BASED_OUTPATIENT_CLINIC_OR_DEPARTMENT_OTHER): Payer: Medicare Other

## 2017-12-25 VITALS — BP 110/72 | HR 70 | Temp 98.1°F | Resp 16 | Ht 61.0 in | Wt 120.0 lb

## 2017-12-25 DIAGNOSIS — R51 Headache: Secondary | ICD-10-CM

## 2017-12-25 DIAGNOSIS — S0990XS Unspecified injury of head, sequela: Secondary | ICD-10-CM

## 2017-12-25 DIAGNOSIS — R413 Other amnesia: Secondary | ICD-10-CM | POA: Diagnosis not present

## 2017-12-25 DIAGNOSIS — R5383 Other fatigue: Secondary | ICD-10-CM | POA: Diagnosis not present

## 2017-12-25 DIAGNOSIS — R269 Unspecified abnormalities of gait and mobility: Secondary | ICD-10-CM

## 2017-12-25 DIAGNOSIS — R519 Headache, unspecified: Secondary | ICD-10-CM

## 2017-12-25 LAB — CBC WITH DIFFERENTIAL/PLATELET
BASOS PCT: 0.9 % (ref 0.0–3.0)
Basophils Absolute: 0 10*3/uL (ref 0.0–0.1)
EOS ABS: 0 10*3/uL (ref 0.0–0.7)
Eosinophils Relative: 0.9 % (ref 0.0–5.0)
HEMATOCRIT: 41.5 % (ref 36.0–46.0)
HEMOGLOBIN: 13.9 g/dL (ref 12.0–15.0)
LYMPHS PCT: 29.8 % (ref 12.0–46.0)
Lymphs Abs: 1.3 10*3/uL (ref 0.7–4.0)
MCHC: 33.4 g/dL (ref 30.0–36.0)
MCV: 91.3 fl (ref 78.0–100.0)
MONOS PCT: 8.9 % (ref 3.0–12.0)
Monocytes Absolute: 0.4 10*3/uL (ref 0.1–1.0)
Neutro Abs: 2.6 10*3/uL (ref 1.4–7.7)
Neutrophils Relative %: 59.5 % (ref 43.0–77.0)
Platelets: 176 10*3/uL (ref 150.0–400.0)
RBC: 4.55 Mil/uL (ref 3.87–5.11)
RDW: 13.2 % (ref 11.5–15.5)
WBC: 4.4 10*3/uL (ref 4.0–10.5)

## 2017-12-25 LAB — VITAMIN D 25 HYDROXY (VIT D DEFICIENCY, FRACTURES): VITD: 41.94 ng/mL (ref 30.00–100.00)

## 2017-12-25 LAB — TSH: TSH: 1.49 u[IU]/mL (ref 0.35–4.50)

## 2017-12-25 LAB — BASIC METABOLIC PANEL
BUN: 14 mg/dL (ref 6–23)
CALCIUM: 9.5 mg/dL (ref 8.4–10.5)
CHLORIDE: 103 meq/L (ref 96–112)
CO2: 31 meq/L (ref 19–32)
Creatinine, Ser: 0.85 mg/dL (ref 0.40–1.20)
GFR: 83.06 mL/min (ref >60.00)
GLUCOSE: 98 mg/dL (ref 70–99)
POTASSIUM: 3.7 meq/L (ref 3.5–5.1)
SODIUM: 141 meq/L (ref 135–145)

## 2017-12-25 LAB — B12 AND FOLATE PANEL: Vitamin B-12: 422 pg/mL (ref 211–911)
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
## 2017-12-25 NOTE — Assessment & Plan Note (Signed)
Pt feels that she's not remembering as well as she should.  However, she's had memory deficit listed on problem list since 2014.  She is a pt of [facility] but has not called them to discuss her sxs.  Will refer back to neuro for complete evaluation.  Pt expressed understanding and is in agreement w/ plan.

## 2017-12-25 NOTE — Patient Instructions (Signed)
We'll notify you of your lab results and CT results and determine the next steps We'll call you with your Neuro appt Drink plenty of fluids REST! Call with any questions or concerns Hang in there!

## 2017-12-26 ENCOUNTER — Ambulatory Visit (HOSPITAL_BASED_OUTPATIENT_CLINIC_OR_DEPARTMENT_OTHER)
Admission: RE | Admit: 2017-12-26 | Discharge: 2017-12-26 | Disposition: A | Discharge status: Home / Self Care | Payer: Medicare Other | Source: Ambulatory Visit | Attending: Family Medicine | Admitting: Family Medicine

## 2017-12-26 DIAGNOSIS — S0990XS Unspecified injury of head, sequela: Secondary | ICD-10-CM

## 2017-12-26 DIAGNOSIS — R269 Unspecified abnormalities of gait and mobility: Secondary | ICD-10-CM | POA: Diagnosis present

## 2017-12-26 DIAGNOSIS — X58XXXS Exposure to other specified factors, sequela: Secondary | ICD-10-CM | POA: Diagnosis not present

## 2017-12-26 DIAGNOSIS — R51 Headache: Secondary | ICD-10-CM | POA: Diagnosis present

## 2017-12-26 DIAGNOSIS — R413 Other amnesia: Secondary | ICD-10-CM

## 2017-12-26 DIAGNOSIS — R519 Headache, unspecified: Secondary | ICD-10-CM

## 2017-12-30 ENCOUNTER — Encounter: Payer: Self-pay | Admitting: Physical Therapy

## 2017-12-30 ENCOUNTER — Ambulatory Visit: Payer: Medicare Other | Attending: Physician Assistant | Admitting: Physical Therapy

## 2017-12-30 DIAGNOSIS — M546 Pain in thoracic spine: Secondary | ICD-10-CM | POA: Diagnosis present

## 2017-12-30 DIAGNOSIS — M62830 Muscle spasm of back: Secondary | ICD-10-CM | POA: Insufficient documentation

## 2017-12-30 DIAGNOSIS — M542 Cervicalgia: Secondary | ICD-10-CM

## 2017-12-30 NOTE — Therapy (Signed)
[place], [place], [address] Phone: [phone]   Fax:  [phone]  Physical Therapy Treatment  Patient Details  Name: Paige Pena MRN: [id number] Date of Birth: 1941-01-12 Referring Provider (PT): Nche   Encounter Date: 12/30/2017  PT End of Session - 12/30/17 1439    Visit Number  5    Date for PT Re-Evaluation  01/30/18    PT Start Time  1355    PT Stop Time  1450    PT Time Calculation (min)  55 min    Activity Tolerance  Patient tolerated treatment well    Behavior During Therapy  [facility] for tasks assessed/performed       Past Medical History:  Diagnosis Date  . Acute cystitis   . Anxiety   . Aphasia    transsient aphasia Jan 2012  . Asthmatic bronchitis   . Cerebrovascular disease   . Chest pain, atypical   . Diverticulosis of colon   . DJD (degenerative joint disease)   . Dysphonia   . Esophageal stricture   . Fibromyalgia   . Gastroparesis   . GERD (gastroesophageal reflux disease)   . Hematuria, microscopic   . Hemorrhoids   . Hypercholesteremia   . IBS (irritable bowel syndrome)   . Memory deficits 02/15/2013  . Memory loss   . RLS (restless legs syndrome)   . TIA (transient ischemic attack)   . Urinary incontinence   . Vitamin D deficiency     Past Surgical History:  Procedure Laterality Date  . ABDOMINAL HYSTERECTOMY  1995  . corneal transplant for keratonconus    . EYE SURGERY  2013  . left foot fracture with ORIF  07/2004   Dr. Sharol Given  . right bunion surgery      There were no vitals filed for this visit.  Subjective Assessment - 12/30/17 1358    Subjective  "Same back problem" Pt reports the most pain in the morning and evening    Currently in Pain?  No/denies                       [facility] Adult PT Treatment/Exercise - 12/30/17 0001      Ambulation/Gait   Gait Comments  one flight of stairs alt pattern one rail, then back down up  hill.       Lumbar Exercises: Aerobic   Nustep  L3 x 6 min       Lumbar Exercises: Machines for Strengthening   Other Lumbar Machine Exercise  Rows & lats 15lb 2x10      Lumbar Exercises: Standing   Other Standing Lumbar Exercises  ER yelow tband 2x10       Modalities   Modalities  Moist Heat;Electrical Stimulation      Moist Heat Therapy   Number Minutes Moist Heat  15 Minutes    Moist Heat Location  Lumbar Spine      Electrical Stimulation   Electrical Stimulation Location  15    Electrical Stimulation Action  IFC    Electrical Stimulation Parameters  Supine to pt tolerance     Electrical Stimulation Goals  Pain               PT Short Term Goals - 12/07/17 1516      PT SHORT TERM GOAL #1   Title  Pt will be independent with HEP    Status  Achieved  PT Long Term Goals - 12/23/17 1519      PT LONG TERM GOAL #1   Title  decrease pain 50%    Status  On-going      PT LONG TERM GOAL #2   Title  understand posture and body mechanics    Status  Partially Met      PT LONG TERM GOAL #3   Title  increase cervical ROM 25%    Status  On-going      PT LONG TERM GOAL #5   Title  report able to go shopping without difficulty    Status  Partially Met            Plan - 12/30/17 1440    Clinical Impression Statement  Pt ~ 10 minutes lae for today's treatment session. Gait down stairs and up hill with a slow cadence. Postural cues needed with seated rows and external rotation. Reports most of her back pain in the morning and late afternoon.    Rehab Potential  Good    PT Frequency  2x / week    PT Duration  8 weeks    PT Treatment/Interventions  ADLs/Self Care Home Management;Cryotherapy;Electrical Stimulation;Moist Heat;Traction;Therapeutic activities;Therapeutic exercise;Neuromuscular re-education;Manual techniques;Patient/family education;Dry needling    PT Next Visit Plan   gym exercises treat spasms and pain as needed       Patient will benefit  from skilled therapeutic intervention in order to improve the following deficits and impairments:  Decreased range of motion, Increased muscle spasms, Pain, Impaired flexibility, Improper body mechanics, Postural dysfunction, Decreased strength  Visit Diagnosis: Cervicalgia  Pain in thoracic spine  Muscle spasm of back     Problem List Patient Active Problem List   Diagnosis Date Noted  . HLD (hyperlipidemia)   . Overactive bladder 05/08/2015  . Aphasia   . Cervical spondylitis ([place])   . Numbness and tingling in left hand 03/21/2015  . TIA (transient ischemic attack) 03/21/2015  . Physical exam 10/05/2014  . Abdominal pain 05/17/2014  . Allergic rhinitis 05/17/2014  . Anxiety and depression 07/25/2013  . Memory deficits 02/15/2013  . Lumbar spondylosis 09/21/2012  . Palpitations 09/16/2010  . Cerebrovascular disease 04/03/2008  . DYSPHONIA 04/03/2008  . Diverticulosis of large intestine 04/02/2008  . Irritable bowel syndrome 04/02/2008  . DEGENERATIVE JOINT DISEASE 04/02/2008  . HEMATURIA, MICROSCOPIC, HX OF 04/02/2008  . Vitamin D deficiency 03/28/2008  . FIBROMYALGIA 03/28/2008  . URINARY INCONTINENCE 03/28/2008  . GERD 08/02/2007  . Gastroparesis 08/02/2007  . HEMORRHOIDS 07/30/2007  . ESOPHAGEAL STRICTURE 07/30/2007    Scot Jun, PTA 12/30/2017, 2:46 PM  [place], [place], [address] Phone: [phone]   Fax:  [phone]  Name: Paige Pena MRN: [id number] Date of Birth: 03-09-1940

## 2017-12-31 ENCOUNTER — Telehealth: Payer: Self-pay | Admitting: Family Medicine

## 2017-12-31 NOTE — Telephone Encounter (Signed)
Patient has been informed of results. 

## 2017-12-31 NOTE — Telephone Encounter (Signed)
Copied from [place] [id number]. Topic: Quick Communication - See Telephone Encounter >> Dec 31, 2017  4:10 PM Blase Mess A wrote: CRM for notification. See Telephone encounter for: 12/31/17.  Patient is calling to get lab results please advise [phone]

## 2018-01-01 ENCOUNTER — Encounter: Payer: Self-pay | Admitting: General Practice

## 2018-01-04 ENCOUNTER — Ambulatory Visit: Payer: Medicare Other | Admitting: Physical Therapy

## 2018-01-04 ENCOUNTER — Other Ambulatory Visit: Payer: Self-pay | Admitting: Family Medicine

## 2018-01-06 ENCOUNTER — Encounter: Payer: Self-pay | Admitting: Physical Therapy

## 2018-01-06 ENCOUNTER — Ambulatory Visit: Payer: Medicare Other | Admitting: Physical Therapy

## 2018-01-06 DIAGNOSIS — M546 Pain in thoracic spine: Secondary | ICD-10-CM

## 2018-01-06 DIAGNOSIS — M542 Cervicalgia: Secondary | ICD-10-CM | POA: Diagnosis not present

## 2018-01-06 DIAGNOSIS — M62830 Muscle spasm of back: Secondary | ICD-10-CM

## 2018-01-06 NOTE — Progress Notes (Signed)
Subjective:   Paige Pena is a 77 y.o. female who presents for Medicare Annual (Subsequent) preventive examination.  Review of Systems:  No ROS.  Medicare Wellness Visit. Additional risk factors are reflected in the social history.  Cardiac Risk Factors include: advanced age (>16men, >10 women);dyslipidemia;family history of premature cardiovascular disease;sedentary lifestyle   Sleep patterns: Sleeps 6 hours. Up to void x 3.  Home Safety/Smoke Alarms: Feels safe in home. Smoke alarms in place.  Living environment; residence and Firearm Safety: Lives with husband in 1 story home.  Seat Belt Safety/Bike Helmet: Wears seat belt.   Female:   Pap-N/A       Mammo-12/01/2017, incomplete. F/U testing completed.        Dexa scan-10/28/2016, Osteopenia.        CCS-Colonoscopy 08/10/2017, polyp.     Objective:     Vitals: BP 110/60 (BP Location: Left Arm, Patient Position: Sitting, Cuff Size: Normal)   Pulse 83   Ht 5\' 1"  (1.549 m)   Wt 120 lb 6 oz (54.6 kg)   SpO2 98%   BMI 22.74 kg/m   Body mass index is 22.74 kg/m.  Advanced Directives 01/07/2018 11/30/2017 12/31/2016 05/07/2015 04/10/2015 04/03/2015 03/20/2015  Does Patient Have a Medical Advance Directive? No No Yes No No No No  Does patient want to make changes to medical advance directive? - - Yes (MAU/Ambulatory/Procedural Areas - Information given) - - - -  Would patient like information on creating a medical advance directive? Yes (MAU/Ambulatory/Procedural Areas - Information given) No - Patient declined - - - No - patient declined information -    Tobacco Social History   Tobacco Use  Smoking Status Never Smoker  Smokeless Tobacco Never Used     Counseling given: Not Answered   Past Medical History:  Diagnosis Date  . Acute cystitis   . Anxiety   . Aphasia    transsient aphasia Jan 2012  . Asthmatic bronchitis   . Cerebrovascular disease   . Chest pain, atypical   . Diverticulosis of colon   . DJD  (degenerative joint disease)   . Dysphonia   . Esophageal stricture   . Fibromyalgia   . Gastroparesis   . GERD (gastroesophageal reflux disease)   . Hematuria, microscopic   . Hemorrhoids   . Hypercholesteremia   . IBS (irritable bowel syndrome)   . Memory deficits 02/15/2013  . Memory loss   . RLS (restless legs syndrome)   . TIA (transient ischemic attack)   . Urinary incontinence   . Vitamin D deficiency    Past Surgical History:  Procedure Laterality Date  . ABDOMINAL HYSTERECTOMY  1995  . corneal transplant for keratonconus    . EYE SURGERY  2013  . left foot fracture with ORIF  07/2004   Dr. Sharol Given  . right bunion surgery     Family History  Problem Relation Age of Onset  . Hyperlipidemia Sister   . Diabetes Brother   . Cancer Brother        lung  . Hypertension Mother   . Anemia Mother   . Dementia Mother   . Kidney disease Father   . Breast cancer Unknown        aunt  . Diabetes Unknown        aunt,brother,uncle  . Kidney disease Unknown        father  . Dementia Maternal Grandmother    Social History   Socioeconomic History  . Marital status: Married  Spouse name: Mallie Mussel  . Number of children: 3  . Years of education: college-2  . Highest education level: Not on file  Occupational History    Employer: RETIRED  Social Needs  . Financial resource strain: Not on file  . Food insecurity:    Worry: Not on file    Inability: Not on file  . Transportation needs:    Medical: Not on file    Non-medical: Not on file  Tobacco Use  . Smoking status: Never Smoker  . Smokeless tobacco: Never Used  Substance and Sexual Activity  . Alcohol use: No    Alcohol/week: 0.0 standard drinks  . Drug use: No  . Sexual activity: Not on file  Lifestyle  . Physical activity:    Days per week: Not on file    Minutes per session: Not on file  . Stress: Not on file  Relationships  . Social connections:    Talks on phone: Not on file    Gets together: Not on file     Attends religious service: Not on file    Active member of club or organization: Not on file    Attends meetings of clubs or organizations: Not on file    Relationship status: Not on file  Other Topics Concern  . Not on file  Social History Narrative   Lennie Hummer   [place], daughter   Isobelle Tuckett info release form to share her medical records with her children   Lives at home w/ her husband   Right-handed   Caffeine: tea on occasion    Outpatient Encounter Medications as of 01/07/2018  Medication Sig  . ALPRAZolam (XANAX) 0.5 MG tablet TAKE  1/2 -1 TABLET BY MOUTH THREE TIMES DAILY AS NEEDED  . aspirin (ECOTRIN) 325 MG EC tablet Take 325 mg by mouth daily.    . Calcium-Magnesium-Vitamin D (CALCIUM 1200+D3 PO) Take 1 tablet 2 (two) times daily by mouth.  . DULoxetine (CYMBALTA) 30 MG capsule TAKE 1 CAPSULE(30 MG) BY MOUTH DAILY  . fluticasone (FLONASE) 50 MCG/ACT nasal spray Place 2 sprays into both nostrils daily as needed for allergies.  . Ginkgo Biloba 40 MG TABS Take by mouth.  . Iron-Vitamins (GERITOL) LIQD Take 15 mLs by mouth daily.  Marland Kitchen KRILL OIL OMEGA-3 PO Take 1 capsule by mouth daily.  . Melatonin 5 MG CAPS Take by mouth at bedtime.  . Multiple Vitamins-Minerals (HAIR SKIN AND NAILS FORMULA PO) Take by mouth. Collagen brand  . NON FORMULARY donmperidone  . omeprazole (PRILOSEC) 40 MG capsule TAKE 1 CAPSULE(40 MG) BY MOUTH DAILY  . prednisoLONE acetate (PRED FORTE) 1 % ophthalmic suspension Place 1 drop into both eyes daily.   Marland Kitchen tiZANidine (ZANAFLEX) 2 MG tablet 1/2-1 tab every 8 hrs as needed for spasm  . tolterodine (DETROL LA) 4 MG 24 hr capsule TAKE 1 CAPSULE(4 MG) BY MOUTH AT BEDTIME  . Zoster Vaccine Adjuvanted [facility]) injection Inject 0.5 mLs into the muscle once for 1 dose.   No facility-administered encounter medications on file as of 01/07/2018.     Activities of Daily Living In your present state of health, do you  have any difficulty performing the following activities: 01/07/2018 10/01/2017  Hearing? N N  Comment - -  Vision? N N  Difficulty concentrating or making decisions? N N  Walking or climbing stairs? N N  Dressing or bathing? N N  Doing errands, shopping? N N  Preparing Food and eating ? N -  Using  the Toilet? N -  In the past six months, have you accidently leaked urine? N -  Do you have problems with loss of bowel control? N -  Managing your Medications? N -  Managing your Finances? N -  Housekeeping or managing your Housekeeping? N -  Some recent data might be hidden    Patient Care Team: [name], MD as PCP - General (Family Medicine) Kathrynn Ducking, MD as Consulting Physician (Neurology) Kathreen Cosier, MD (Internal Medicine) Druscilla Brownie, MD as Consulting Physician (Dermatology) [facility] Belva Crome, MD as Consulting Physician (Cardiology) Marilynne Halsted, MD as Referring Physician (Ophthalmology)    Assessment:   This is a routine wellness examination for Ruba.  Exercise Activities and Dietary recommendations Current Exercise Habits: The patient does not participate in regular exercise at present, Exercise limited by: None identified   Diet (meal preparation, eat out, water intake, caffeinated beverages, dairy products, fruits and vegetables): Drinks almond milk, choc milk, OJ/cranberry juice and water.   Breakfast: oatmeal; coffee occasionally; cheese toast; yogurt Lunch/Dinner: chili; applesauce; protein and vegetable.   Goals    . Increase physical activity     Increase activity, in gym 2 x week.     . Patient Stated     Improve sleep pattern.        Fall Risk Fall Risk  01/07/2018 04/09/2017 12/31/2016 06/04/2016 03/26/2015  Falls in the past year? 0 No No No Yes  Number falls in past yr: - - - - 1  Injury with Fall? - - - - No  Risk for fall due to : - - - - Impaired balance/gait  Follow up - -  - - Falls prevention discussed    Depression Screen PHQ 2/9 Scores 01/07/2018 10/01/2017 04/09/2017 12/31/2016  PHQ - 2 Score 0 0 0 0  PHQ- 9 Score - 0 1 1  Exception Documentation - - - -     Cognitive Function MMSE - Mini Mental State Exam 01/07/2018 05/27/2017 11/25/2016 05/22/2016 10/03/2015  Orientation to time 5 5 5 4 5   Orientation to Place 5 5 4 5 5   Registration 3 3 3 3 3   Attention/ Calculation 5 2 5 3 5   Recall 2 3 3 3 3   Language- name 2 objects 2 2 2 1 2   Language- repeat 1 1 1 1 1   Language- follow 3 step command 3 2 3 3 2   Language- read & follow direction 1 1 1 1 1   Write a sentence 1 1 1 1 1   Copy design 1 1 1 1 1   Total score 29 26 29 26 29         Immunization History  Administered Date(s) Administered  . Influenza Split 11/24/2010, 11/11/2011  . Influenza Whole 12/25/2008, 01/15/2010  . Influenza, High Dose Seasonal PF 11/19/2015, 12/07/2017  . Influenza,inj,Quad PF,6+ Mos 04/04/2014  . Influenza-Unspecified 12/26/2014, 12/22/2016  . Pneumococcal Conjugate-13 10/05/2014  . Pneumococcal Polysaccharide-23 12/31/2016  . Zoster 05/09/2011    Screening Tests Health Maintenance  Topic Date Due  . TETANUS/TDAP  02/24/2018 (Originally 05/26/1959)  . DEXA SCAN  10/29/2018  . MAMMOGRAM  12/02/2018  . INFLUENZA VACCINE  Completed  . PNA vac Low Risk Adult  Completed        Plan:     Shingles vaccine at pharmacy.   Bring a copy of your living will and/or healthcare power of attorney to your next office visit.  Continue doing brain stimulating  activities (puzzles, reading, adult coloring books, staying active) to keep memory sharp.   I have personally reviewed and noted the following in the patient's chart:   . Medical and social history . Use of alcohol, tobacco or illicit drugs  . Current medications and supplements . Functional ability and status . Nutritional status . Physical activity . Advanced directives . List of other  physicians . Hospitalizations, surgeries, and ER visits in previous 12 months . Vitals . Screenings to include cognitive, depression, and falls . Referrals and appointments  In addition, I have reviewed and discussed with patient certain preventive protocols, quality metrics, and best practice recommendations. A written personalized care plan for preventive services as well as general preventive health recommendations were provided to patient.     Gerilyn Nestle, RN  01/07/2018  PCP Notes: -Pt continues to have fatigue, no desire to participate in normal activities (church).  -F/U with PCP 02/04/18

## 2018-01-06 NOTE — Therapy (Signed)
[place], [place], [address] Phone: [phone]   Fax:  [phone]  Physical Therapy Treatment  Patient Details  Name: Paige Pena MRN: [id number] Date of Birth: 05-Jan-1941 Referring Provider (PT): Nche   Encounter Date: 01/06/2018  PT End of Session - 01/06/18 1524    Visit Number  6    Date for PT Re-Evaluation  01/30/18    PT Start Time  6962    PT Stop Time  1534    PT Time Calculation (min)  57 min    Activity Tolerance  Patient tolerated treatment well    Behavior During Therapy  [facility] for tasks assessed/performed       Past Medical History:  Diagnosis Date  . Acute cystitis   . Anxiety   . Aphasia    transsient aphasia Jan 2012  . Asthmatic bronchitis   . Cerebrovascular disease   . Chest pain, atypical   . Diverticulosis of colon   . DJD (degenerative joint disease)   . Dysphonia   . Esophageal stricture   . Fibromyalgia   . Gastroparesis   . GERD (gastroesophageal reflux disease)   . Hematuria, microscopic   . Hemorrhoids   . Hypercholesteremia   . IBS (irritable bowel syndrome)   . Memory deficits 02/15/2013  . Memory loss   . RLS (restless legs syndrome)   . TIA (transient ischemic attack)   . Urinary incontinence   . Vitamin D deficiency     Past Surgical History:  Procedure Laterality Date  . ABDOMINAL HYSTERECTOMY  1995  . corneal transplant for keratonconus    . EYE SURGERY  2013  . left foot fracture with ORIF  07/2004   Dr. Sharol Given  . right bunion surgery      There were no vitals filed for this visit.  Subjective Assessment - 01/06/18 1440    Subjective  "It is my lower back" "It is like their is a catch or something there"    Currently in Pain?  --   "Not like pain pain, It like uncomfortable and a catch"   Pain Location  Back         [facility] PT Assessment - 01/06/18 0001      AROM   Overall AROM Comments  Cervical and Lumbar ROM WFL                    [facility] Adult PT Treatment/Exercise - 01/06/18 0001      Exercises   Exercises  Neck;Lumbar      Lumbar Exercises: Stretches   Single Knee to Chest Stretch  Right;Left;3 reps;10 seconds    Double Knee to Chest Stretch  2 reps;10 seconds    Lower Trunk Rotation  3 reps;20 seconds      Lumbar Exercises: Aerobic   Nustep  L4 x 6 min       Lumbar Exercises: Machines for Strengthening   Other Lumbar Machine Exercise  Rows & lats 20lb 2x10      Lumbar Exercises: Standing   Row  Theraband;Both;Strengthening;20 reps    Theraband Level (Row)  Level 3 (Green)    Shoulder Extension  Theraband;Both;20 reps    Theraband Level (Shoulder Extension)  Level 3 (Green)    Other Standing Lumbar Exercises  Red tband hip abd/add 2x10                PT Short Term Goals - 12/07/17 1516  PT SHORT TERM GOAL #1   Title  Pt will be independent with HEP    Status  Achieved        PT Long Term Goals - 01/06/18 1456      PT LONG TERM GOAL #1   Title  decrease pain 50%    Status  On-going      PT LONG TERM GOAL #3   Title  increase cervical ROM 25%    Status  Achieved      PT LONG TERM GOAL #4   Title  increase lumbar ROM 25%    Status  Achieved      PT LONG TERM GOAL #5   Title  report able to go shopping without difficulty    Status  Partially Met            Plan - 01/06/18 1526    Clinical Impression Statement  Pt has progressed meeting her lumbar and cervical ROM goals. Weakness noted with standing hip abd/add more so on with the LLE. Postural cues needed with standing shoulder extensions. Tolerated increase weight with seated rows and extensions. Low back and hip tightness noted with single and double K2C.    Rehab Potential  Good    PT Frequency  2x / week    PT Duration  8 weeks    PT Treatment/Interventions  ADLs/Self Care Home Management;Cryotherapy;Electrical Stimulation;Moist Heat;Traction;Therapeutic activities;Therapeutic  exercise;Neuromuscular re-education;Manual techniques;Patient/family education;Dry needling    PT Next Visit Plan   gym exercises treat spasms and pain as needed       Patient will benefit from skilled therapeutic intervention in order to improve the following deficits and impairments:  Decreased range of motion, Increased muscle spasms, Pain, Impaired flexibility, Improper body mechanics, Postural dysfunction, Decreased strength  Visit Diagnosis: Cervicalgia  Muscle spasm of back  Pain in thoracic spine     Problem List Patient Active Problem List   Diagnosis Date Noted  . HLD (hyperlipidemia)   . Overactive bladder 05/08/2015  . Aphasia   . Cervical spondylitis ([place])   . Numbness and tingling in left hand 03/21/2015  . TIA (transient ischemic attack) 03/21/2015  . Physical exam 10/05/2014  . Abdominal pain 05/17/2014  . Allergic rhinitis 05/17/2014  . Anxiety and depression 07/25/2013  . Memory deficits 02/15/2013  . Lumbar spondylosis 09/21/2012  . Palpitations 09/16/2010  . Cerebrovascular disease 04/03/2008  . DYSPHONIA 04/03/2008  . Diverticulosis of large intestine 04/02/2008  . Irritable bowel syndrome 04/02/2008  . DEGENERATIVE JOINT DISEASE 04/02/2008  . HEMATURIA, MICROSCOPIC, HX OF 04/02/2008  . Vitamin D deficiency 03/28/2008  . FIBROMYALGIA 03/28/2008  . URINARY INCONTINENCE 03/28/2008  . GERD 08/02/2007  . Gastroparesis 08/02/2007  . HEMORRHOIDS 07/30/2007  . ESOPHAGEAL STRICTURE 07/30/2007    Scot Jun 01/06/2018, 3:28 PM  [place] [place], [place], [address] Phone: [phone]   Fax:  [phone]  Name: TIAUNA WHISNANT MRN: [id number] Date of Birth: Jul 12, 1940

## 2018-01-07 ENCOUNTER — Other Ambulatory Visit: Payer: Self-pay

## 2018-01-07 ENCOUNTER — Ambulatory Visit (INDEPENDENT_AMBULATORY_CARE_PROVIDER_SITE_OTHER): Payer: Medicare Other

## 2018-01-07 DIAGNOSIS — Z23 Encounter for immunization: Secondary | ICD-10-CM | POA: Diagnosis not present

## 2018-01-07 DIAGNOSIS — Z0000 Encounter for general adult medical examination without abnormal findings: Secondary | ICD-10-CM

## 2018-01-07 MED ORDER — ZOSTER VAC RECOMB ADJUVANTED 50 MCG/0.5ML IM SUSR: 0.5000 mL | Freq: Once | INTRAMUSCULAR | 1 refills | Status: AC

## 2018-01-07 NOTE — Patient Instructions (Addendum)
Shingles vaccine at pharmacy.   Bring a copy of your living will and/or healthcare power of attorney to your next office visit.  Continue doing brain stimulating activities (puzzles, reading, adult coloring books, staying active) to keep memory sharp.   Health Maintenance, Female Adopting a healthy lifestyle and getting preventive care can go a long way to promote health and wellness. Talk with your health care provider about what schedule of regular examinations is right for you. This is a good chance for you to check in with your provider about disease prevention and staying healthy. In between checkups, there are plenty of things you can do on your own. Experts have done a lot of research about which lifestyle changes and preventive measures are most likely to keep you healthy. Ask your health care provider for more information. Weight and diet Eat a healthy diet  Be sure to include plenty of vegetables, fruits, low-fat dairy products, and lean protein.  Do not eat a lot of foods high in solid fats, added sugars, or salt.  Get regular exercise. This is one of the most important things you can do for your health. ? Most adults should exercise for at least 150 minutes each week. The exercise should increase your heart rate and make you sweat (moderate-intensity exercise). ? Most adults should also do strengthening exercises at least twice a week. This is in addition to the moderate-intensity exercise.  Maintain a healthy weight  Body mass index (BMI) is a measurement that can be used to identify possible weight problems. It estimates body fat based on height and weight. Your health care provider can help determine your BMI and help you achieve or maintain a healthy weight.  For females 67 years of age and older: ? A BMI below 18.5 is considered underweight. ? A BMI of 18.5 to 24.9 is normal. ? A BMI of 25 to 29.9 is considered overweight. ? A BMI of 30 and above is considered  obese.  Watch levels of cholesterol and blood lipids  You should start having your blood tested for lipids and cholesterol at 78 years of age, then have this test every 5 years.  You may need to have your cholesterol levels checked more often if: ? Your lipid or cholesterol levels are high. ? You are older than 78 years of age. ? You are at high risk for heart disease.  Cancer screening Lung Cancer  Lung cancer screening is recommended for adults 45-23 years old who are at high risk for lung cancer because of a history of smoking.  A yearly low-dose CT scan of the lungs is recommended for people who: ? Currently smoke. ? Have quit within the past 15 years. ? Have at least a 30-pack-year history of smoking. A pack year is smoking an average of one pack of cigarettes a day for 1 year.  Yearly screening should continue until it has been 15 years since you quit.  Yearly screening should stop if you develop a health problem that would prevent you from having lung cancer treatment.  Breast Cancer  Practice breast self-awareness. This means understanding how your breasts normally appear and feel.  It also means doing regular breast self-exams. Let your health care provider know about any changes, no matter how small.  If you are in your 20s or 30s, you should have a clinical breast exam (CBE) by a health care provider every 1-3 years as part of a regular health exam.  If you are 40  or older, have a CBE every year. Also consider having a breast X-ray (mammogram) every year.  If you have a family history of breast cancer, talk to your health care provider about genetic screening.  If you are at high risk for breast cancer, talk to your health care provider about having an MRI and a mammogram every year.  Breast cancer gene (BRCA) assessment is recommended for women who have family members with BRCA-related cancers. BRCA-related cancers  include: ? Breast. ? Ovarian. ? Tubal. ? Peritoneal cancers.  Results of the assessment will determine the need for genetic counseling and BRCA1 and BRCA2 testing.  Cervical Cancer Your health care provider may recommend that you be screened regularly for cancer of the pelvic organs (ovaries, uterus, and vagina). This screening involves a pelvic examination, including checking for microscopic changes to the surface of your cervix (Pap test). You may be encouraged to have this screening done every 3 years, beginning at age 21.  For women ages 30-65, health care providers may recommend pelvic exams and Pap testing every 3 years, or they may recommend the Pap and pelvic exam, combined with testing for human papilloma virus (HPV), every 5 years. Some types of HPV increase your risk of cervical cancer. Testing for HPV may also be done on women of any age with unclear Pap test results.  Other health care providers may not recommend any screening for nonpregnant women who are considered low risk for pelvic cancer and who do not have symptoms. Ask your health care provider if a screening pelvic exam is right for you.  If you have had past treatment for cervical cancer or a condition that could lead to cancer, you need Pap tests and screening for cancer for at least 20 years after your treatment. If Pap tests have been discontinued, your risk factors (such as having a new sexual partner) need to be reassessed to determine if screening should resume. Some women have medical problems that increase the chance of getting cervical cancer. In these cases, your health care provider may recommend more frequent screening and Pap tests.  Colorectal Cancer  This type of cancer can be detected and often prevented.  Routine colorectal cancer screening usually begins at 78 years of age and continues through 78 years of age.  Your health care provider may recommend screening at an earlier age if you have risk factors  for colon cancer.  Your health care provider may also recommend using home test kits to check for hidden blood in the stool.  A small camera at the end of a tube can be used to examine your colon directly (sigmoidoscopy or colonoscopy). This is done to check for the earliest forms of colorectal cancer.  Routine screening usually begins at age 50.  Direct examination of the colon should be repeated every 5-10 years through 78 years of age. However, you may need to be screened more often if early forms of precancerous polyps or small growths are found.  Skin Cancer  Check your skin from head to toe regularly.  Tell your health care provider about any new moles or changes in moles, especially if there is a change in a mole's shape or color.  Also tell your health care provider if you have a mole that is larger than the size of a pencil eraser.  Always use sunscreen. Apply sunscreen liberally and repeatedly throughout the day.  Protect yourself by wearing long sleeves, pants, a wide-brimmed hat, and sunglasses whenever you are   outside.  Heart disease, diabetes, and high blood pressure  High blood pressure causes heart disease and increases the risk of stroke. High blood pressure is more likely to develop in: ? People who have blood pressure in the high end of the normal range (130-139/85-89 mm Hg). ? People who are overweight or obese. ? People who are African American.  If you are 87-36 years of age, have your blood pressure checked every 3-5 years. If you are 52 years of age or older, have your blood pressure checked every year. You should have your blood pressure measured twice-once when you are at a hospital or clinic, and once when you are not at a hospital or clinic. Record the average of the two measurements. To check your blood pressure when you are not at a hospital or clinic, you can use: ? An automated blood pressure machine at a pharmacy. ? A home blood pressure monitor.  If  you are between 28 years and 36 years old, ask your health care provider if you should take aspirin to prevent strokes.  Have regular diabetes screenings. This involves taking a blood sample to check your fasting blood sugar level. ? If you are at a normal weight and have a low risk for diabetes, have this test once every three years after 78 years of age. ? If you are overweight and have a high risk for diabetes, consider being tested at a younger age or more often. Preventing infection Hepatitis B  If you have a higher risk for hepatitis B, you should be screened for this virus. You are considered at high risk for hepatitis B if: ? You were born in a country where hepatitis B is common. Ask your health care provider which countries are considered high risk. ? Your parents were born in a high-risk country, and you have not been immunized against hepatitis B (hepatitis B vaccine). ? You have HIV or AIDS. ? You use needles to inject street drugs. ? You live with someone who has hepatitis B. ? You have had sex with someone who has hepatitis B. ? You get hemodialysis treatment. ? You take certain medicines for conditions, including cancer, organ transplantation, and autoimmune conditions.  Hepatitis C  Blood testing is recommended for: ? Everyone born from [birth year] through 1966. ? Anyone with known risk factors for hepatitis C.  Sexually transmitted infections (STIs)  You should be screened for sexually transmitted infections (STIs) including gonorrhea and chlamydia if: ? You are sexually active and are younger than 78 years of age. ? You are older than 78 years of age and your health care provider tells you that you are at risk for this type of infection. ? Your sexual activity has changed since you were last screened and you are at an increased risk for chlamydia or gonorrhea. Ask your health care provider if you are at risk.  If you do not have HIV, but are at risk, it may be recommended  that you take a prescription medicine daily to prevent HIV infection. This is called pre-exposure prophylaxis (PrEP). You are considered at risk if: ? You are sexually active and do not regularly use condoms or know the HIV status of your partner(s). ? You take drugs by injection. ? You are sexually active with a partner who has HIV.  Talk with your health care provider about whether you are at high risk of being infected with HIV. If you choose to begin PrEP, you should first be tested  for HIV. You should then be tested every 3 months for as long as you are taking PrEP. Pregnancy  If you are premenopausal and you may become pregnant, ask your health care provider about preconception counseling.  If you may become pregnant, take 400 to 800 micrograms (mcg) of folic acid every day.  If you want to prevent pregnancy, talk to your health care provider about birth control (contraception). Osteoporosis and menopause  Osteoporosis is a disease in which the bones lose minerals and strength with aging. This can result in serious bone fractures. Your risk for osteoporosis can be identified using a bone density scan.  If you are 65 years of age or older, or if you are at risk for osteoporosis and fractures, ask your health care provider if you should be screened.  Ask your health care provider whether you should take a calcium or vitamin D supplement to lower your risk for osteoporosis.  Menopause may have certain physical symptoms and risks.  Hormone replacement therapy may reduce some of these symptoms and risks. Talk to your health care provider about whether hormone replacement therapy is right for you. Follow these instructions at home:  Schedule regular health, dental, and eye exams.  Stay current with your immunizations.  Do not use any tobacco products including cigarettes, chewing tobacco, or electronic cigarettes.  If you are pregnant, do not drink alcohol.  If you are  breastfeeding, limit how much and how often you drink alcohol.  Limit alcohol intake to no more than 1 drink per day for nonpregnant women. One drink equals 12 ounces of beer, 5 ounces of wine, or 1 ounces of hard liquor.  Do not use street drugs.  Do not share needles.  Ask your health care provider for help if you need support or information about quitting drugs.  Tell your health care provider if you often feel depressed.  Tell your health care provider if you have ever been abused or do not feel safe at home. This information is not intended to replace advice given to you by your health care provider. Make sure you discuss any questions you have with your health care provider. Document Released: 08/26/2010 Document Revised: 07/19/2015 Document Reviewed: 11/14/2014 Elsevier Interactive Patient Education  2018 [place]: MyChart  Dear Ms. Releford  We are excited to introduce MyChart, a new best-in-class service that provides you online access to important information in your electronic medical record. We want to make it easier for you to view your health information - all in one secure location - when and where you need it. We expect MyChart will enhance the quality of care and service we provide. Use the activation code below to enroll in MyChart online at [email or web address]  When you register for MyChart, you can:  Marland Kitchen View your test results. . Communicate securely with your physician's office.  . View your medical history, allergies, medications, and immunizations. . Conveniently print information such as your medication lists.  If you are age 33 or older and want a member of your family to have access to your record, you must provide written consent by completing a proxy form available at our facility. Please speak to our clinical staff about guidelines regarding accounts for patients younger than age 13.  As you activate your MyChart account and need any  technical assistance, please call the MyChart technical support line at [phone]-CHART [phone]).  Thank you for using MyChart as your new health and wellness  resource!  MyChart Activation Code:  Activation code not generated Current MyChart Status: Active           [facility]  [address] [place], [place] [address]

## 2018-01-07 NOTE — Progress Notes (Signed)
I have reviewed the documentation from the recent AWV done by Kim Broome; I agree with the documentation and will follow up on any recommendations or abnormal findings as suggested.  

## 2018-01-12 NOTE — Telephone Encounter (Signed)
Patient calling and states that her daughter has questions regarding the patient's CT scan from 12/31/17

## 2018-01-13 ENCOUNTER — Telehealth: Payer: Self-pay | Admitting: Adult Health

## 2018-01-13 ENCOUNTER — Ambulatory Visit: Payer: Medicare Other | Admitting: Physical Therapy

## 2018-01-13 ENCOUNTER — Encounter: Payer: Self-pay | Admitting: Physical Therapy

## 2018-01-13 DIAGNOSIS — M62830 Muscle spasm of back: Secondary | ICD-10-CM

## 2018-01-13 DIAGNOSIS — M542 Cervicalgia: Secondary | ICD-10-CM

## 2018-01-13 DIAGNOSIS — M546 Pain in thoracic spine: Secondary | ICD-10-CM

## 2018-01-13 MED ORDER — GABAPENTIN 100 MG PO CAPS: 100.0000 mg | ORAL_CAPSULE | Freq: Two times a day (BID) | ORAL | 3 refills | Status: DC

## 2018-01-13 NOTE — Addendum Note (Signed)
Addended by: Kathrynn Ducking on: 01/13/2018 11:12 AM   Modules accepted: Orders

## 2018-01-13 NOTE — Therapy (Signed)
[facility] [place], [place], [address] Phone: [phone]   Fax:  [phone]  Physical Therapy Treatment  Patient Details  Name: Paige Pena MRN: [id number] Date of Birth: 03/12/1940 Referring Provider (PT): Nche   Encounter Date: 01/13/2018  PT End of Session - 01/13/18 1440    Visit Number  7    Date for PT Re-Evaluation  01/30/18    PT Start Time  6015    PT Stop Time  1439    PT Time Calculation (min)  44 min    Activity Tolerance  Patient tolerated treatment well    Behavior During Therapy  [facility] for tasks assessed/performed       Past Medical History:  Diagnosis Date  . Acute cystitis   . Anxiety   . Aphasia    transsient aphasia Jan 2012  . Asthmatic bronchitis   . Cerebrovascular disease   . Chest pain, atypical   . Diverticulosis of colon   . DJD (degenerative joint disease)   . Dysphonia   . Esophageal stricture   . Fibromyalgia   . Gastroparesis   . GERD (gastroesophageal reflux disease)   . Hematuria, microscopic   . Hemorrhoids   . Hypercholesteremia   . IBS (irritable bowel syndrome)   . Memory deficits 02/15/2013  . Memory loss   . RLS (restless legs syndrome)   . TIA (transient ischemic attack)   . Urinary incontinence   . Vitamin D deficiency     Past Surgical History:  Procedure Laterality Date  . ABDOMINAL HYSTERECTOMY  1995  . corneal transplant for keratonconus    . EYE SURGERY  2013  . left foot fracture with ORIF  07/2004   Dr. Sharol Given  . right bunion surgery      There were no vitals filed for this visit.  Subjective Assessment - 01/13/18 1356    Subjective  "Not good, It is the same stuff" "Something is wrong with my left leg I don't know what is wrong with it."    Currently in Pain?  Yes    Pain Score  7     Pain Location  Back                       [facility] Adult PT Treatment/Exercise - 01/13/18 0001      Ambulation/Gait   Gait  Comments  one flight of stairs alt pattern one rail, then back down up hill.       Lumbar Exercises: Aerobic   Nustep  L4 x 6 min       Lumbar Exercises: Machines for Strengthening   Other Lumbar Machine Exercise  Rows & lats 20lb 2x10      Lumbar Exercises: Standing   Row  Theraband;Both;Strengthening;20 reps    Theraband Level (Row)  Level 3 (Green)    Shoulder Extension  Theraband;Both;20 reps    Theraband Level (Shoulder Extension)  Level 3 (Green)    Other Standing Lumbar Exercises  S2S OHP with yellow ball 2x20                PT Short Term Goals - 12/07/17 1516      PT SHORT TERM GOAL #1   Title  Pt will be independent with HEP    Status  Achieved        PT Long Term Goals - 01/06/18 1456      PT LONG TERM GOAL #  1   Title  decrease pain 50%    Status  On-going      PT LONG TERM GOAL #3   Title  increase cervical ROM 25%    Status  Achieved      PT LONG TERM GOAL #4   Title  increase lumbar ROM 25%    Status  Achieved      PT LONG TERM GOAL #5   Title  report able to go shopping without difficulty    Status  Partially Met            Plan - 01/13/18 1442    Clinical Impression Statement  Pt enters clinic reporting that things are still the same. Pt reports no functional limitations at home she just states "It feels uncomfortable" when asked about her ADLS. She did well overall today, she was also to ascend stairs with an alternating pattern with no rail. Pt was also able to vary speed when ambulating up hill. Good strength with all exercises.     Rehab Potential  Good    PT Frequency  2x / week    PT Duration  8 weeks    PT Treatment/Interventions  ADLs/Self Care Home Management;Cryotherapy;Electrical Stimulation;Moist Heat;Traction;Therapeutic activities;Therapeutic exercise;Neuromuscular re-education;Manual techniques;Patient/family education;Dry needling    PT Next Visit Plan   gym exercises, functional training.       Patient will benefit  from skilled therapeutic intervention in order to improve the following deficits and impairments:  Decreased range of motion, Increased muscle spasms, Pain, Impaired flexibility, Improper body mechanics, Postural dysfunction, Decreased strength  Visit Diagnosis: Cervicalgia  Muscle spasm of back  Pain in thoracic spine     Problem List Patient Active Problem List   Diagnosis Date Noted  . HLD (hyperlipidemia)   . Overactive bladder 05/08/2015  . Aphasia   . Cervical spondylitis ([place])   . Numbness and tingling in left hand 03/21/2015  . TIA (transient ischemic attack) 03/21/2015  . Physical exam 10/05/2014  . Abdominal pain 05/17/2014  . Allergic rhinitis 05/17/2014  . Anxiety and depression 07/25/2013  . Memory deficits 02/15/2013  . Lumbar spondylosis 09/21/2012  . Palpitations 09/16/2010  . Cerebrovascular disease 04/03/2008  . DYSPHONIA 04/03/2008  . Diverticulosis of large intestine 04/02/2008  . Irritable bowel syndrome 04/02/2008  . DEGENERATIVE JOINT DISEASE 04/02/2008  . HEMATURIA, MICROSCOPIC, HX OF 04/02/2008  . Vitamin D deficiency 03/28/2008  . FIBROMYALGIA 03/28/2008  . URINARY INCONTINENCE 03/28/2008  . GERD 08/02/2007  . Gastroparesis 08/02/2007  . HEMORRHOIDS 07/30/2007  . ESOPHAGEAL STRICTURE 07/30/2007    Scot Jun, PTA 01/13/2018, 2:50 PM  [facility] [place], [place], [address] Phone: [phone]   Fax:  [phone]  Name: Paige Pena MRN: [id number] Date of Birth: [birth year]

## 2018-01-13 NOTE — Telephone Encounter (Signed)
Pt daughter(on DPR-Collins,Karen) has called and states that she is wanting a call from Dr Jannifer Franklin to discuss the results of the MRI scans that according to Dr Virgil Benedict office are in [place] for Dr Jannifer Franklin to review.  Please call
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
## 2018-01-13 NOTE — Telephone Encounter (Signed)
Spoke with patient who gave me her daughter's phone number and asked me to call her because she had questions. Called daughter and discussed with her - she just wanted basic information about where she went, so I gave her that information.  She asked if we were able to pull the actual CT scan to tell her what certain parts of her brain looked like, I advised that this would be something she could discuss with Neuro. She asked about the status of that referral - advised that according to the notes they have reached out to her mom 3 times and she has not made an appointment yet.  She said that her mother is very nervous about this appointment so that is why she is hesitant to go.   Patient will be visiting her daughter over thanksgiving and she is going to discuss this with her to see if she can ease the anxiety about this.   Advised to call if she had any other questions.

## 2018-01-28 ENCOUNTER — Ambulatory Visit: Payer: Medicare Other | Admitting: Physical Therapy

## 2018-01-29 ENCOUNTER — Ambulatory Visit: Payer: Medicare Other | Attending: Physician Assistant | Admitting: Physical Therapy

## 2018-01-29 DIAGNOSIS — M62830 Muscle spasm of back: Secondary | ICD-10-CM | POA: Insufficient documentation

## 2018-01-29 DIAGNOSIS — M542 Cervicalgia: Secondary | ICD-10-CM | POA: Insufficient documentation

## 2018-01-29 DIAGNOSIS — M546 Pain in thoracic spine: Secondary | ICD-10-CM | POA: Insufficient documentation

## 2018-02-03 ENCOUNTER — Encounter: Payer: Self-pay | Admitting: Physical Therapy

## 2018-02-03 ENCOUNTER — Ambulatory Visit: Payer: Medicare Other | Admitting: Physical Therapy

## 2018-02-03 DIAGNOSIS — M546 Pain in thoracic spine: Secondary | ICD-10-CM

## 2018-02-03 DIAGNOSIS — M542 Cervicalgia: Secondary | ICD-10-CM | POA: Diagnosis not present

## 2018-02-03 DIAGNOSIS — M62830 Muscle spasm of back: Secondary | ICD-10-CM

## 2018-02-03 NOTE — Therapy (Signed)
[place], [place], [address] Phone: [phone]   Fax:  [phone]  Physical Therapy Treatment  Patient Details  Name: Paige Pena MRN: [id number] Date of Birth: 08-06-1940 Referring Provider (PT): Nche   Encounter Date: 02/03/2018  PT End of Session - 02/03/18 1430    Visit Number  8    Date for PT Re-Evaluation  01/30/18    PT Start Time  1347    PT Stop Time  1430    PT Time Calculation (min)  43 min    Activity Tolerance  Patient tolerated treatment well    Behavior During Therapy  [facility] for tasks assessed/performed       Past Medical History:  Diagnosis Date  . Acute cystitis   . Anxiety   . Aphasia    transsient aphasia Jan 2012  . Asthmatic bronchitis   . Cerebrovascular disease   . Chest pain, atypical   . Diverticulosis of colon   . DJD (degenerative joint disease)   . Dysphonia   . Esophageal stricture   . Fibromyalgia   . Gastroparesis   . GERD (gastroesophageal reflux disease)   . Hematuria, microscopic   . Hemorrhoids   . Hypercholesteremia   . IBS (irritable bowel syndrome)   . Memory deficits 02/15/2013  . Memory loss   . RLS (restless legs syndrome)   . TIA (transient ischemic attack)   . Urinary incontinence   . Vitamin D deficiency     Past Surgical History:  Procedure Laterality Date  . ABDOMINAL HYSTERECTOMY  1995  . corneal transplant for keratonconus    . EYE SURGERY  2013  . left foot fracture with ORIF  07/2004   Dr. Sharol Given  . right bunion surgery      There were no vitals filed for this visit.  Subjective Assessment - 02/03/18 1349    Subjective  Pt stated that she just has return from [place] visiting her daughter. She reports being able to go just uncomfortable at times after sitting from more than 15 - 20 minutes. Today she reports fatigue and stiffness.     Currently in Pain?  Yes    Pain Score  7     Pain Location  Back                        [facility] Adult PT Treatment/Exercise - 02/03/18 0001      Ambulation/Gait   Gait Comments  one flight of stairs alt pattern one rail, then down hall around front island through grass and leaves.      Exercises   Exercises  Neck;Lumbar      Lumbar Exercises: Stretches   Passive Hamstring Stretch  Left;Right;4 reps;10 seconds    Single Knee to Chest Stretch  Right;Left;4 reps;10 seconds    Lower Trunk Rotation  3 reps;20 seconds      Lumbar Exercises: Aerobic   Nustep  L4 x 6 min       Lumbar Exercises: Machines for Strengthening   Leg Press  39lb x10, 20lb x10     Other Lumbar Machine Exercise  Rows & lats 20lb 2x10      Lumbar Exercises: Seated   Sit to Stand  10 reps   No ue Assist need to prevent adduction   Other Seated Lumbar Exercises  Sit to stand with OHP x10  PT Short Term Goals - 12/07/17 1516      PT SHORT TERM GOAL #1   Title  Pt will be independent with HEP    Status  Achieved        PT Long Term Goals - 02/03/18 1421      PT LONG TERM GOAL #1   Title  decrease pain 50%    Status  Partially Met      PT LONG TERM GOAL #2   Title  understand posture and body mechanics    Status  Partially Met      PT LONG TERM GOAL #3   Title  increase cervical ROM 25%    Status  Achieved      PT LONG TERM GOAL #4   Title  increase lumbar ROM 25%    Status  Achieved      PT LONG TERM GOAL #5   Title  report able to go shopping without difficulty    Status  Partially Met            Plan - 02/03/18 1431    Clinical Impression Statement  Again no functional limitations reported just discomfort after prolong sitting. No issues completing today's exercises despite 7/10 pain rating. Seated rows and lats ! to be more of a struggle. Som HS tightness noted with passive stretching L>R.     Rehab Potential  Good    PT Frequency  2x / week    PT Duration  8 weeks    PT Treatment/Interventions  ADLs/Self Care  Home Management;Cryotherapy;Electrical Stimulation;Moist Heat;Traction;Therapeutic activities;Therapeutic exercise;Neuromuscular re-education;Manual techniques;Patient/family education;Dry needling    PT Next Visit Plan   gym exercises, functional training.       Patient will benefit from skilled therapeutic intervention in order to improve the following deficits and impairments:  Decreased range of motion, Increased muscle spasms, Pain, Impaired flexibility, Improper body mechanics, Postural dysfunction, Decreased strength  Visit Diagnosis: Cervicalgia  Muscle spasm of back  Pain in thoracic spine     Problem List Patient Active Problem List   Diagnosis Date Noted  . HLD (hyperlipidemia)   . Overactive bladder 05/08/2015  . Aphasia   . Cervical spondylitis ([place])   . Numbness and tingling in left hand 03/21/2015  . TIA (transient ischemic attack) 03/21/2015  . Physical exam 10/05/2014  . Abdominal pain 05/17/2014  . Allergic rhinitis 05/17/2014  . Anxiety and depression 07/25/2013  . Memory deficits 02/15/2013  . Lumbar spondylosis 09/21/2012  . Palpitations 09/16/2010  . Cerebrovascular disease 04/03/2008  . DYSPHONIA 04/03/2008  . Diverticulosis of large intestine 04/02/2008  . Irritable bowel syndrome 04/02/2008  . DEGENERATIVE JOINT DISEASE 04/02/2008  . HEMATURIA, MICROSCOPIC, HX OF 04/02/2008  . Vitamin D deficiency 03/28/2008  . FIBROMYALGIA 03/28/2008  . URINARY INCONTINENCE 03/28/2008  . GERD 08/02/2007  . Gastroparesis 08/02/2007  . HEMORRHOIDS 07/30/2007  . ESOPHAGEAL STRICTURE 07/30/2007    Scot Jun, PTA 02/03/2018, 2:37 PM  [place], [place], [address] Phone: [phone]   Fax:  [phone]  Name: Paige Pena MRN: [id number] Date of Birth: 08-Jun-1940

## 2018-02-04 ENCOUNTER — Ambulatory Visit: Payer: Medicare Other | Admitting: Family Medicine

## 2018-02-04 ENCOUNTER — Ambulatory Visit: Payer: Self-pay | Admitting: Family Medicine

## 2018-02-04 ENCOUNTER — Other Ambulatory Visit: Payer: Self-pay

## 2018-02-04 ENCOUNTER — Encounter: Payer: Self-pay | Admitting: Family Medicine

## 2018-02-04 ENCOUNTER — Telehealth: Payer: Self-pay

## 2018-02-04 VITALS — BP 110/74 | HR 65 | Temp 97.9°F | Resp 16 | Ht 61.0 in | Wt 124.0 lb

## 2018-02-04 DIAGNOSIS — R5383 Other fatigue: Secondary | ICD-10-CM

## 2018-02-04 DIAGNOSIS — E785 Hyperlipidemia, unspecified: Secondary | ICD-10-CM | POA: Diagnosis not present

## 2018-02-04 LAB — B12 AND FOLATE PANEL
Folate: 18.4 ng/mL (ref >5.9)
Vitamin B-12: 420 pg/mL (ref 211–911)

## 2018-02-04 LAB — CBC WITH DIFFERENTIAL/PLATELET
BASOS ABS: 0 10*3/uL (ref 0.0–0.1)
BASOS PCT: 0.6 % (ref 0.0–3.0)
EOS PCT: 2.2 % (ref 0.0–5.0)
Eosinophils Absolute: 0.1 10*3/uL (ref 0.0–0.7)
HEMATOCRIT: 40 % (ref 36.0–46.0)
Hemoglobin: 13.2 g/dL (ref 12.0–15.0)
LYMPHS PCT: 37.7 % (ref 12.0–46.0)
Lymphs Abs: 1.3 10*3/uL (ref 0.7–4.0)
MCHC: 33 g/dL (ref 30.0–36.0)
MCV: 91.9 fl (ref 78.0–100.0)
Monocytes Absolute: 0.4 10*3/uL (ref 0.1–1.0)
Monocytes Relative: 10.8 % (ref 3.0–12.0)
NEUTROS ABS: 1.7 10*3/uL (ref 1.4–7.7)
NEUTROS PCT: 48.7 % (ref 43.0–77.0)
PLATELETS: 167 10*3/uL (ref 150.0–400.0)
RBC: 4.36 Mil/uL (ref 3.87–5.11)
RDW: 13.7 % (ref 11.5–15.5)
WBC: 3.5 10*3/uL — AB (ref 4.0–10.5)

## 2018-02-04 LAB — HEPATIC FUNCTION PANEL
ALK PHOS: 64 U/L (ref 39–117)
ALT: 13 U/L (ref 0–35)
AST: 24 U/L (ref 0–37)
Albumin: 4.1 g/dL (ref 3.5–5.2)
BILIRUBIN DIRECT: 0.1 mg/dL (ref 0.0–0.3)
BILIRUBIN TOTAL: 0.5 mg/dL (ref 0.2–1.2)
Total Protein: 6.7 g/dL (ref 6.0–8.3)

## 2018-02-04 LAB — BASIC METABOLIC PANEL
BUN: 15 mg/dL (ref 6–23)
CO2: 31 mEq/L (ref 19–32)
CREATININE: 0.94 mg/dL (ref 0.40–1.20)
Calcium: 9.3 mg/dL (ref 8.4–10.5)
Chloride: 103 mEq/L (ref 96–112)
GFR: 73.93 mL/min (ref >60.00)
Glucose, Bld: 82 mg/dL (ref 70–99)
Potassium: 4.1 mEq/L (ref 3.5–5.1)
SODIUM: 139 meq/L (ref 135–145)

## 2018-02-04 LAB — LIPID PANEL
CHOL/HDL RATIO: 3
CHOLESTEROL: 222 mg/dL — AB (ref 0–200)
HDL: 84.5 mg/dL (ref >39.00)
LDL Cholesterol: 124 mg/dL — ABNORMAL HIGH (ref 0–99)
NonHDL: 137.67
Triglycerides: 69 mg/dL (ref 0.0–149.0)
VLDL: 13.8 mg/dL (ref 0.0–40.0)

## 2018-02-04 LAB — TSH: TSH: 2.04 u[IU]/mL (ref 0.35–4.50)

## 2018-02-04 LAB — VITAMIN D 25 HYDROXY (VIT D DEFICIENCY, FRACTURES): VITD: 32.19 ng/mL (ref 30.00–100.00)

## 2018-02-04 NOTE — Telephone Encounter (Signed)
Noted  

## 2018-02-04 NOTE — Patient Instructions (Signed)
Schedule your complete physical in 6 months We'll notify you of your lab results and make any changes if needed The fatigue may be a normal part of aging.  This is very common and also hard to accept. If the anxiety doesn't improve in the next few weeks, let me know and we can go up on the Duloxetine if needed Call with any questions or concerns GOOD LUCK On Monday! Happy Holidays!

## 2018-02-04 NOTE — Telephone Encounter (Signed)
Pt is coming at 2:00 today.  Will address her concerns at that time

## 2018-02-04 NOTE — Progress Notes (Signed)
   Subjective:    Patient ID: Paige Pena, female    DOB: 1940/04/17, 77 y.o.   MRN: [id number]  HPI Hyperlipidemia- chronic problem, on fish oil.  Attempting to control w/ diet and exercise.  Last LDL 129, total cholesterol 233, HDL 90.  No CP, SOB, visual changes, abd pain.  Fatigue- recurrent issue for pt.  We did a work up for this in November.  'i've just been so tired'.  Went to health food store and bought supplements.  Pt reports she is sleeping w/ exception of needing to wake up and urinate- up to 3x/nightly.  No issues w/ snoring.  Pt has hx of depression/anxiety.   Review of Systems For ROS see HPI     Objective:   Physical Exam Vitals signs reviewed.  Constitutional:      General: She is not in acute distress.    Appearance: She is well-developed.  HENT:     Head: Normocephalic and atraumatic.  Eyes:     Conjunctiva/sclera: Conjunctivae normal.     Pupils: Pupils are equal, round, and reactive to light.  Neck:     Musculoskeletal: Normal range of motion and neck supple.     Thyroid: No thyromegaly.  Cardiovascular:     Rate and Rhythm: Normal rate and regular rhythm.     Heart sounds: Normal heart sounds. No murmur.  Pulmonary:     Effort: Pulmonary effort is normal. No respiratory distress.     Breath sounds: Normal breath sounds.  Abdominal:     General: There is no distension.     Palpations: Abdomen is soft.     Tenderness: There is no abdominal tenderness.  Lymphadenopathy:     Cervical: No cervical adenopathy.  Skin:    General: Skin is warm and dry.  Neurological:     Mental Status: She is alert and oriented to person, place, and time.  Psychiatric:        Behavior: Behavior normal.           Assessment & Plan:

## 2018-02-04 NOTE — Telephone Encounter (Signed)
Copied from [place] [id number]. Topic: Appointment Scheduling - Scheduling Inquiry for Clinic >> Feb 04, 2018  8:49 AM Margot Ables wrote: Reason for CRM: her husband is not able to bring her earlier today, pt having eye surgery on Monday and having fatigue, she noted this is a long standing appt and she is very disappointed, pt could not reschedule as she needs to see how she recovers from eye surgery.  Pt wanting to know if possible for Dr. [name] to call her to discuss her concerns prior to Monday morning since she is not able to see the pt today. Please call home #.

## 2018-02-05 ENCOUNTER — Other Ambulatory Visit: Payer: Self-pay | Admitting: General Practice

## 2018-02-05 ENCOUNTER — Encounter: Payer: Self-pay | Admitting: General Practice

## 2018-02-05 MED ORDER — DULOXETINE HCL 60 MG PO CPEP: 60.0000 mg | ORAL_CAPSULE | Freq: Every day | ORAL | 3 refills | Status: DC

## 2018-02-05 NOTE — Assessment & Plan Note (Signed)
Deteriorated.  We had visit for this Nov 1- all labs were WNL upon review.  Again had long discussion about age playing a part, anxiety/depression.  Discussed increasing Cymbalta to 60mg  to improve fatigue, mood, and pain.  Pt wants to think about this.  Will repeat labs but I suspect these will again be normal.  Will follow closely.

## 2018-02-05 NOTE — Assessment & Plan Note (Signed)
Chronic problem.  Attempting to control w/ diet and exercise.  Total cholesterol is misleading b/c HDL is so high.  Check labs and determine if meds are needed.

## 2018-03-18 ENCOUNTER — Institutional Professional Consult (permissible substitution): Payer: Medicare Other | Admitting: Neurology

## 2018-03-18 ENCOUNTER — Telehealth: Payer: Self-pay | Admitting: *Deleted

## 2018-03-18 ENCOUNTER — Telehealth: Payer: Self-pay | Admitting: Neurology

## 2018-03-18 ENCOUNTER — Encounter

## 2018-03-18 NOTE — Telephone Encounter (Signed)
Patient had an appt today but she arrived 20 minutes late and could not be seen.  She requests a call back please.  States she has questions about her medications.

## 2018-03-18 NOTE — Telephone Encounter (Signed)
I contacted the pt and was able to schedule f/u appt for 03/26/18 at 12 pm arrival time of 11:30 am.

## 2018-03-18 NOTE — Telephone Encounter (Signed)
I called the patient.  The patient missed her appointment today, but is complaining of some new changes with speech alterations, tremor and some trouble with walking.  She is concerned this may be related to gabapentin.  The patient may stop the gabapentin now, I would need to get her worked in to the office to evaluate her, the symptoms above could potentially be related to oncoming Parkinson's symptoms.

## 2018-03-18 NOTE — Telephone Encounter (Signed)
This patient did not show for a revisit appointment today. 

## 2018-03-18 NOTE — Telephone Encounter (Signed)
I returned pt's call. Pt wanted to apologize for missing her appt this morning and requested a message be sent to MD advising on her recent symptoms. Pt states over the last 2 weeks she has been having trouble with swallowing, loosing her voice and a slight tremor in bilateral hands. Pt wanted to verify if these symptoms could be related to her Gabapentin?  I advised I would fwd message to MD to further review, pt was agreeable.

## 2018-03-19 ENCOUNTER — Encounter: Payer: Self-pay | Admitting: Neurology

## 2018-03-26 ENCOUNTER — Encounter: Payer: Self-pay | Admitting: Neurology

## 2018-03-26 ENCOUNTER — Ambulatory Visit (INDEPENDENT_AMBULATORY_CARE_PROVIDER_SITE_OTHER): Payer: Medicare Other | Admitting: Neurology

## 2018-03-26 VITALS — BP 134/80 | HR 103 | Ht 61.0 in | Wt 123.3 lb

## 2018-03-26 DIAGNOSIS — M47816 Spondylosis without myelopathy or radiculopathy, lumbar region: Secondary | ICD-10-CM

## 2018-03-26 DIAGNOSIS — R413 Other amnesia: Secondary | ICD-10-CM | POA: Diagnosis not present

## 2018-03-26 NOTE — Progress Notes (Signed)
Reason for visit: Left leg pain, gait disorder  Paige Pena is an 78 y.o. female  History of present illness:  Paige Pena is a 78 year old right-handed black female with a history of a mild memory disturbance.  The patient has lumbar spondylosis, a possible low-grade left L5 radiculopathy.  The patient has some occasional sensations on the left frontal area.  The patient was placed on gabapentin, but she could not tolerate the medication as she developed gait instability and diarrhea.  She stopped the medication and has done better.  The patient has not had any falls.  She still has some achy sensations below the knee on the left at times.  She is on Cymbalta 60 mg daily.  She returns for an evaluation.  Past Medical History:  Diagnosis Date  . Acute cystitis   . Anxiety   . Aphasia    transsient aphasia Jan 2012  . Asthmatic bronchitis   . Cerebrovascular disease   . Chest pain, atypical   . Diverticulosis of colon   . DJD (degenerative joint disease)   . Dysphonia   . Esophageal stricture   . Fibromyalgia   . Gastroparesis   . GERD (gastroesophageal reflux disease)   . Hematuria, microscopic   . Hemorrhoids   . Hypercholesteremia   . IBS (irritable bowel syndrome)   . Memory deficits 02/15/2013  . Memory loss   . RLS (restless legs syndrome)   . TIA (transient ischemic attack)   . Urinary incontinence   . Vitamin D deficiency     Past Surgical History:  Procedure Laterality Date  . ABDOMINAL HYSTERECTOMY  1995  . corneal transplant for keratonconus    . EYE SURGERY  2013  . left foot fracture with ORIF  07/2004   Dr. Sharol Given  . right bunion surgery      Family History  Problem Relation Age of Onset  . Hyperlipidemia Sister   . Diabetes Brother   . Cancer Brother        lung  . Hypertension Mother   . Anemia Mother   . Dementia Mother   . Kidney disease Father   . Breast cancer Other        aunt  . Diabetes Other        aunt,brother,uncle  .  Kidney disease Other        father  . Dementia Maternal Grandmother     Social history:  reports that she has never smoked. She has never used smokeless tobacco. She reports that she does not drink alcohol or use drugs.    Allergies  Allergen Reactions  . Morphine Other (See Comments)    REACTION: lethargy/malaise  . Metoclopramide Hcl Anxiety and Other (See Comments)    REACTION: anxious and out of her mind  . Aricept [Donepezil Hcl]     nausea  . Doxycycline Diarrhea  . Penicillins Itching and Rash    Has patient had a PCN reaction causing immediate rash, facial/tongue/throat swelling, SOB or lightheadedness with hypotension: No Has patient had a PCN reaction causing severe rash involving mucus membranes or skin necrosis: No Has patient had a PCN reaction that required hospitalization No Has patient had a PCN reaction occurring within the last 10 years: Yes If all of the above answers are "NO", then may proceed with Cephalosporin use.    . Prednisone Itching and Rash  . Sulfonamide Derivatives Itching and Rash    Medications:  Prior to Admission medications  Medication Sig Start Date End Date Taking? Authorizing Provider  ALPRAZolam Duanne Moron) 0.5 MG tablet TAKE  1/2 -1 TABLET BY MOUTH THREE TIMES DAILY AS NEEDED 05/23/13  Yes [name], MD  aspirin (ECOTRIN) 325 MG EC tablet Take 325 mg by mouth daily.     Yes [provider]  Calcium-Magnesium-Vitamin D (CALCIUM 1200+D3 PO) Take 1 tablet 2 (two) times daily by mouth.   Yes [provider]  DULoxetine (CYMBALTA) 60 MG capsule Take 1 capsule (60 mg total) by mouth daily. 02/05/18  Yes [name], MD  fluticasone (FLONASE) 50 MCG/ACT nasal spray Place 2 sprays into both nostrils daily as needed for allergies. 06/04/16  Yes [name], MD  Ginkgo Biloba 40 MG TABS Take by mouth.   Yes [provider]  Iron-Vitamins (GERITOL) LIQD Take 15 mLs by mouth daily.   Yes [provider]  KRILL OIL OMEGA-3 PO Take 1 capsule by mouth daily.   Yes [provider]  Melatonin 5 MG CAPS Take by mouth at bedtime.   Yes [provider]  Multiple Vitamins-Minerals (HAIR SKIN AND NAILS FORMULA PO) Take by mouth. Collagen brand   Yes [provider]  NON FORMULARY donmperidone   Yes [provider]  omeprazole (PRILOSEC) 40 MG capsule TAKE 1 CAPSULE(40 MG) BY MOUTH DAILY 01/04/18  Yes [name], MD  prednisoLONE acetate (PRED FORTE) 1 % ophthalmic suspension Place 1 drop into both eyes daily.  12/22/12  Yes [provider]  tiZANidine (ZANAFLEX) 2 MG tablet 1/2-1 tab every 8 hrs as needed for spasm 10/01/17  Yes [name], MD  tolterodine (DETROL LA) 4 MG 24 hr capsule TAKE 1 CAPSULE(4 MG) BY MOUTH AT BEDTIME 10/01/17  Yes [name], MD  gabapentin (NEURONTIN) 100 MG capsule Take 1 capsule (100 mg total) by mouth 2 (two) times daily. Patient not taking: Reported on 03/26/2018 01/13/18   Kathrynn Ducking, MD    ROS:  Out of a complete 14 system review of symptoms, the patient complains only of the following symptoms, and all other reviewed systems are negative.  Ringing in the ears, difficulty swallowing Heart murmur Excessive thirst Incontinence of the bowels Incontinence of the bladder, frequency of urination Muscle cramps Bruising easily Numbness, tremors  Blood pressure 134/80, pulse (!) 103, height 5\' 1"  (1.549 m), weight 123 lb 5 oz (55.9 kg).  Physical Exam  General: The patient is alert and cooperative at the time of the examination.  Skin: No significant peripheral edema is noted.   Neurologic Exam  Mental status: The patient is alert and oriented x 3 at the time of the examination. The patient has apparent normal recent and remote memory, with an apparently normal attention span and concentration ability.   Cranial nerves: Facial symmetry is present. Speech is normal, no  aphasia or dysarthria is noted. Extraocular movements are full. Visual fields are full.  Motor: The patient has good strength in all 4 extremities.  Sensory examination: Soft touch sensation is symmetric on the face, arms, and legs.  Coordination: The patient has good finger-nose-finger and heel-to-shin bilaterally.  Gait and station: The patient has a normal gait. Tandem gait is minimally unsteady.  Romberg is negative. No drift is seen.  Reflexes: Deep tendon reflexes are symmetric.   Assessment/Plan:  1.  Mild memory disturbance  2.  Left leg pain, low-grade L5 radiculopathy  The patient will stay off of the gabapentin.  We will follow  her memory issues over time.  If the pain in the left leg worsens, an epidural steroid injection can be done in the future.  She will follow-up for next scheduled appointment in June.  Jill Alexanders MD 03/26/2018 12:31 PM  [facility] [address] [place], [place] [address]  Phone [phone] Fax [phone]

## 2018-05-04 ENCOUNTER — Other Ambulatory Visit: Payer: Self-pay | Admitting: Family Medicine

## 2018-05-31 ENCOUNTER — Ambulatory Visit: Payer: Medicare Other | Admitting: Adult Health

## 2018-06-09 ENCOUNTER — Other Ambulatory Visit: Payer: Self-pay | Admitting: Family Medicine

## 2018-07-05 ENCOUNTER — Other Ambulatory Visit: Payer: Self-pay | Admitting: Family Medicine

## 2018-07-16 ENCOUNTER — Other Ambulatory Visit: Payer: Self-pay | Admitting: Family Medicine

## 2018-08-05 ENCOUNTER — Telehealth: Payer: Self-pay | Admitting: Neurology

## 2018-08-05 NOTE — Telephone Encounter (Signed)
Due to current COVID 19 pandemic, our office is severely reducing in office visits until further notice, in order to minimize the risk to our patients and healthcare providers.   Called patient to offer a virtual visit for her 6/16 appt. No answer, LVM requesting she call back to discuss appt.

## 2018-08-09 NOTE — Telephone Encounter (Signed)
I reached out to the pt and requested a call back so we could complete pre charting for 08/10/18 visit.  [facility] # and hours provided.

## 2018-08-09 NOTE — Telephone Encounter (Signed)
Called patient again today to discuss a mychart virtual visit. Was able to make contact with pt at home phone number, however she states she will need to call back later today.

## 2018-08-10 ENCOUNTER — Telehealth (INDEPENDENT_AMBULATORY_CARE_PROVIDER_SITE_OTHER): Payer: Medicare Other | Admitting: Neurology

## 2018-08-10 ENCOUNTER — Encounter: Payer: Self-pay | Admitting: Neurology

## 2018-08-10 DIAGNOSIS — R413 Other amnesia: Secondary | ICD-10-CM

## 2018-08-10 NOTE — Progress Notes (Signed)
     Virtual Visit via Video Note  I connected with Paige Pena on 08/10/18 at 10:00 AM EDT by a video enabled telemedicine application and verified that I am speaking with the correct person using two identifiers.  Location: Patient: The patient is at home. Provider: Physician in office.   I discussed the limitations of evaluation and management by telemedicine and the availability of in person appointments. The patient expressed understanding and agreed to proceed.  History of Present Illness: Paige Pena is a 78 year old right-handed black female with a history of a mild memory disturbance and a history of neck pain and occasional headaches.  The patient has had some low back pain and sciatica type pain on her last visit in January 2020, but she believes that the symptoms have improved.  She still has some neck pain, she reports some left-sided occipital headaches and some frontal headaches as well that may come on 2 or 3 times a week and last 15 minutes and then go away.  She does not wish to have medications for the headache currently.  She has been under some increased stress recently as her son-in-law has become seriously ill with the COVID virus.  She believes that she has possibly become somewhat depressed with isolation from the pandemic.  She is going to bed late at night, she was sleep late in the day, and spends much of her day in and out of bed, becoming very inactive.  She does not go out of the house much.  The patient believes that her cognitive abilities have declined since that she has been isolated.  She is able to manage her own affairs, she manages her own medications and appointments and keeps up with her finances.  She has not altered any activities of daily living because of memory.   Observations/Objective: The WebEx evaluation reveals that the patient is alert and cooperative.  Speech is well enunciated, not aphasic or dysarthric.  She has good facial symmetry,  extraocular movements are full.  The Moca-blind evaluation reveals a total score of 14/22.  Assessment and Plan: 1.  Memory disturbance  2.  Headache, possible cervicogenic headache  3.  Chronic low back and neck discomfort  The patient will be followed for the memory issue over time, she will follow-up in 6 months.  If the memory does not seem to improve, we will need to consider medication for memory at that time.  The patient is living alone, she remains independent.  She will contact our office if she does desire medication for her headache.  Follow Up Instructions:  46-month follow-up, may see nurse practitioner.   I discussed the assessment and treatment plan with the patient. The patient was provided an opportunity to ask questions and all were answered. The patient agreed with the plan and demonstrated an understanding of the instructions.   The patient was advised to call back or seek an in-person evaluation if the symptoms worsen or if the condition fails to improve as anticipated.  I provided 25 minutes of non-face-to-face time during this encounter.   Kathrynn Ducking, MD

## 2018-08-11 ENCOUNTER — Telehealth: Payer: Self-pay | Admitting: Neurology

## 2018-08-11 NOTE — Telephone Encounter (Signed)
LVM to schedule 6 month follow-up with NP per Dr. Jannifer Franklin. Patient needs follow-up around Dec. 16 2020. Requested patient to call back, office contact info was provided.

## 2018-08-23 ENCOUNTER — Telehealth: Payer: Self-pay | Admitting: *Deleted

## 2018-08-23 NOTE — Telephone Encounter (Signed)
Please advise 

## 2018-08-23 NOTE — Telephone Encounter (Signed)
I recommend sitting on a cushion to improve pain.  Also, tylenol as needed.  If no improvement will need evaluation

## 2018-08-23 NOTE — Telephone Encounter (Signed)
Patient called and said that she was washing her storm door and it was not latched and she started to fall forward - she jerked herself back and landed on her tailbone. She is having some pain below her waistline that is radiating to her hips. She states the pain is not constant, just when she turns certain ways. She has no visible bruising.  She has tried OTC treatments and hot/cold packs - she is just curious if there is anything more she should be doing.  Asked that I just send a note back to see what she says.

## 2018-08-24 NOTE — Telephone Encounter (Signed)
Patient notified of PCP recommendations and is agreement and expresses an understanding.   Ok for PEC to Discuss results / PCP recommendations / Schedule patient.   

## 2018-10-15 ENCOUNTER — Other Ambulatory Visit: Payer: Self-pay | Admitting: Family Medicine

## 2018-11-30 ENCOUNTER — Telehealth: Payer: Self-pay

## 2018-11-30 NOTE — Telephone Encounter (Signed)
Patient called in wanting to know when her last CPE was. Informed patient it was in Feb 2019, offered to schedule CPE for this year. Patient began to tell me that she is having issues with breathing, stating that she is unable to take a full deep breath. Hurting in her back and chest. Has been going on for over two months, COVID test negative back in August. Wanting to know what PCP suggests. Unable to do VV, OK to schedule in office?

## 2018-12-01 ENCOUNTER — Ambulatory Visit: Payer: Medicare Other | Admitting: Family Medicine

## 2018-12-01 ENCOUNTER — Other Ambulatory Visit: Payer: Self-pay

## 2018-12-01 ENCOUNTER — Encounter: Payer: Self-pay | Admitting: Family Medicine

## 2018-12-01 VITALS — BP 110/82 | HR 110 | Temp 97.9°F | Resp 16 | Ht 61.0 in | Wt 132.4 lb

## 2018-12-01 DIAGNOSIS — R0689 Other abnormalities of breathing: Secondary | ICD-10-CM | POA: Diagnosis not present

## 2018-12-01 DIAGNOSIS — F32A Depression, unspecified: Secondary | ICD-10-CM

## 2018-12-01 DIAGNOSIS — K219 Gastro-esophageal reflux disease without esophagitis: Secondary | ICD-10-CM | POA: Diagnosis not present

## 2018-12-01 DIAGNOSIS — Z23 Encounter for immunization: Secondary | ICD-10-CM | POA: Diagnosis not present

## 2018-12-01 DIAGNOSIS — F419 Anxiety disorder, unspecified: Secondary | ICD-10-CM | POA: Diagnosis not present

## 2018-12-01 DIAGNOSIS — F329 Major depressive disorder, single episode, unspecified: Secondary | ICD-10-CM

## 2018-12-01 MED ORDER — FLUTICASONE PROPIONATE 50 MCG/ACT NA SUSP: 2.0000 | Freq: Every day | NASAL | 3 refills | Status: AC | PRN

## 2018-12-01 MED ORDER — BUSPIRONE HCL 5 MG PO TABS: 5.0000 mg | ORAL_TABLET | Freq: Two times a day (BID) | ORAL | 1 refills | Status: DC

## 2018-12-01 MED ORDER — DULOXETINE HCL 60 MG PO CPEP: 60.0000 mg | ORAL_CAPSULE | Freq: Every day | ORAL | 6 refills | Status: DC

## 2018-12-01 MED ORDER — OMEPRAZOLE 40 MG PO CPDR: 40.0000 mg | DELAYED_RELEASE_CAPSULE | Freq: Two times a day (BID) | ORAL | 1 refills | Status: DC

## 2018-12-01 NOTE — Assessment & Plan Note (Signed)
Deteriorated.  Pt has had a very stressful family situation w/ her son-in-law being hospitalized since April w/ [place] and just now being d/c'd to a rehab facility.  Multiple life threatening complications.  Will likely lose fingers and toes in the near future.  This has been very difficult for pt.  She is understandably scared of COVID but also fears losing her friends and committee relationships if she doesn't attend groups and meetings.  She feels very torn and this worsens her anxiety.  Will add Buspar and monitor closely.

## 2018-12-01 NOTE — Telephone Encounter (Signed)
Can schedule for in-office visit as long as she is not coughing, doesn't have a fever, etc If her breathing or chest pain worsens or changes before her appt, she needs to go to ER

## 2018-12-01 NOTE — Progress Notes (Signed)
   Subjective:    Patient ID: Paige Pena, female    DOB: 10/25/1940, 79 y.o.   MRN: [id number]  HPI SOB- 'I can't get a good deep breath'.  'it's right in here' pointing to xyphoid process.  'and w/ COVID it has me worked up'.  Pt reports home O2 sats 'are always normal'.  Family member (son-in-law) has had a prolonged COVID course- dry gangrene, blood clots, HD, vent.  Grandson also tested positive.  Pt admits to increased anxiety.  Pt reports sxs have been ongoing 'for some years'.  'I have phlegm that I've had for years'- just recently started Zyrtec.  Pt reports sxs are 'not consistent but regular'.  Denies CP.  Pt reports increased belching and indigestion   Review of Systems For ROS see HPI     Objective:   Physical Exam Vitals signs reviewed.  Constitutional:      General: She is not in acute distress.    Appearance: She is well-developed. She is not ill-appearing.  HENT:     Head: Normocephalic and atraumatic.  Eyes:     Conjunctiva/sclera: Conjunctivae normal.     Pupils: Pupils are equal, round, and reactive to light.  Neck:     Musculoskeletal: Normal range of motion and neck supple.     Thyroid: No thyromegaly.  Cardiovascular:     Rate and Rhythm: Normal rate and regular rhythm.     Heart sounds: Normal heart sounds. No murmur.  Pulmonary:     Effort: Pulmonary effort is normal. No respiratory distress.     Breath sounds: Normal breath sounds.  Abdominal:     General: There is no distension.     Palpations: Abdomen is soft.     Tenderness: There is no abdominal tenderness.  Lymphadenopathy:     Cervical: No cervical adenopathy.  Skin:    General: Skin is warm and dry.  Neurological:     Mental Status: She is alert and oriented to person, place, and time.  Psychiatric:        Mood and Affect: Mood is anxious.        Behavior: Behavior normal.           Assessment & Plan:  Difficulty taking a deep breath- new to provider but pt reports this has been  ongoing 'for years'.  Suspect there is a component of anxiety as well as worsening reflux/hiatal hernia.  When she is distracted, she breathes normally and w/o difficulty.  She is able to speak in paragraphs w/o SOB.  When she sits still and thinks about her breathing she becomes agitated, anxious, has to stand.  O2 sats WNL in office and at home.  No CP.  Will treat anxiety and GERD and monitor closely for sxs improvement.  Reviewed supportive care and red flags that should prompt return.  Pt expressed understanding and is in agreement w/ plan.

## 2018-12-01 NOTE — Telephone Encounter (Signed)
Called and spoke with pt. She passed screening. Pt made appt for 3:30 this afternoon. Pt advised if symptoms worsen she needs to seek treatment in ER. Pt agreed.

## 2018-12-01 NOTE — Patient Instructions (Signed)
Follow up in 3-4 weeks to recheck anxiety, reflux, and breathing DOUBLE the Omeprazole to 1 tab TWICE daily ADD the Buspirone twice daily for anxiety Try and change positions to improve breathing Call with any questions or concerns Remember, you don't owe anyone anything! Stay Safe!

## 2018-12-01 NOTE — Assessment & Plan Note (Signed)
Deteriorated.  Suspect this is driven by her increased anxiety.  Double the Omeprazole to BID.  Pt expressed understanding and is in agreement w/ plan.

## 2018-12-06 ENCOUNTER — Other Ambulatory Visit: Payer: Self-pay | Admitting: General Practice

## 2018-12-06 MED ORDER — OMEPRAZOLE 40 MG PO CPDR: 40.0000 mg | DELAYED_RELEASE_CAPSULE | Freq: Two times a day (BID) | ORAL | 1 refills | Status: DC

## 2018-12-06 MED ORDER — DULOXETINE HCL 60 MG PO CPEP: 60.0000 mg | ORAL_CAPSULE | Freq: Every day | ORAL | 1 refills | Status: DC

## 2018-12-16 ENCOUNTER — Encounter: Payer: Self-pay | Admitting: Family Medicine

## 2018-12-16 ENCOUNTER — Ambulatory Visit: Payer: Medicare Other | Admitting: Family Medicine

## 2018-12-16 ENCOUNTER — Other Ambulatory Visit: Payer: Self-pay

## 2018-12-16 VITALS — BP 130/80 | HR 78 | Temp 98.0°F | Resp 16 | Ht 61.0 in | Wt 132.1 lb

## 2018-12-16 DIAGNOSIS — F32A Depression, unspecified: Secondary | ICD-10-CM

## 2018-12-16 DIAGNOSIS — F419 Anxiety disorder, unspecified: Secondary | ICD-10-CM | POA: Diagnosis not present

## 2018-12-16 DIAGNOSIS — F329 Major depressive disorder, single episode, unspecified: Secondary | ICD-10-CM | POA: Diagnosis not present

## 2018-12-16 MED ORDER — BUSPIRONE HCL 10 MG PO TABS: 10.0000 mg | ORAL_TABLET | Freq: Two times a day (BID) | ORAL | 3 refills | Status: DC

## 2018-12-16 NOTE — Progress Notes (Signed)
   Subjective:    Patient ID: Paige Pena, female    DOB: December 23, 1940, 78 y.o.   MRN: [id number]  HPI Anxiety/Depression- ongoing issue for pt. Buspar was added at last visit for increased anxiety.  On Cymbalta 60mg , alprazolam 0.5mg  TID PRN, and Buspar 5mg  BID.  Pt reports the addition of Buspar has helped take the edge off.  'and I just stay sleepy'- not new.  Is not getting up when alarm goes off.  Decreased motivation.  Difficulty going to sleep at night b/c she's sleeping during the day.  Review of Systems For ROS see HPI     Objective:   Physical Exam Vitals signs reviewed.  Constitutional:      General: She is not in acute distress.    Appearance: Normal appearance.  HENT:     Head: Normocephalic and atraumatic.  Skin:    General: Skin is warm and dry.  Neurological:     General: No focal deficit present.     Mental Status: She is alert and oriented to person, place, and time.  Psychiatric:        Mood and Affect: Mood normal.        Behavior: Behavior normal.        Thought Content: Thought content normal.           Assessment & Plan:

## 2018-12-16 NOTE — Patient Instructions (Signed)
Follow up in 1 month to recheck mood and sleep Increase the Buspar to 10mg  twice daily (I sent a new prescription for the 10mg  dose) Set a bed time each night (following a routine is helpful) Set an alarm to wake each morning around the same time Make a daily 'to do' list and check off each box as it occurs- get up and dressed, take a walk, read a book/magazine, cook dinner, etc Call with any questions or concerns Hang in there!!

## 2018-12-16 NOTE — Assessment & Plan Note (Addendum)
Mild improvement w/ addition of Buspar.  Discussed her lack of motivation and purpose.  Encouraged her to make a daily schedule.  Needs to set sleep and wake times.  Talked about spreading out her household tasks across the week to make it less overwhelming.  Strongly encouraged daily walk or other form of exercise.  Increase Buspar to 10mg  BID.  Did discuss Belsomra.  Pt wants to try increased Buspar first.  Will continue to follow closely.  Total time spent w/ pt, 30 minutes, >50% spent counseling

## 2018-12-17 ENCOUNTER — Telehealth: Payer: Self-pay | Admitting: Family Medicine

## 2018-12-17 NOTE — Telephone Encounter (Signed)
Please advise 

## 2018-12-17 NOTE — Telephone Encounter (Signed)
It is not likely to be a medication interaction.  I suspect the jittery feeling is bc she hasn't eaten and it's 2:30pm.  That will definitely cause lightheadedness as well.  I would recommend she start eating more regularly, increase her water intake and take the medicine for a few more days to see if symptoms improve.

## 2018-12-17 NOTE — Telephone Encounter (Signed)
Pt called in stating that she is feeling lightheaded and her "thinking seems to be slow." She has taken the medication 2X. She says she feels jittery. She hasn't ate today but is about to eat something. She wanted to know if the Buspirone and Duloxetine could be interacting with one another. Pt can be reached at the home #

## 2018-12-17 NOTE — Telephone Encounter (Signed)
Called and advised pt of PCP recommendations, advised her to keep a log of symptoms and call us back on Monday. Pt disconnected call.

## 2018-12-30 LAB — HM MAMMOGRAPHY: HM Mammogram: ABNORMAL — AB (ref 0–4)

## 2019-01-13 ENCOUNTER — Other Ambulatory Visit: Payer: Self-pay | Admitting: General Practice

## 2019-01-13 MED ORDER — TOLTERODINE TARTRATE ER 4 MG PO CP24: 4.0000 mg | ORAL_CAPSULE | Freq: Every day | ORAL | 0 refills | Status: DC

## 2019-01-24 ENCOUNTER — Other Ambulatory Visit: Payer: Self-pay

## 2019-01-24 ENCOUNTER — Ambulatory Visit (INDEPENDENT_AMBULATORY_CARE_PROVIDER_SITE_OTHER): Payer: Medicare Other | Admitting: Family Medicine

## 2019-01-24 ENCOUNTER — Encounter: Payer: Self-pay | Admitting: Family Medicine

## 2019-01-24 VITALS — BP 135/82 | HR 77 | Temp 98.3°F

## 2019-01-24 DIAGNOSIS — F32A Depression, unspecified: Secondary | ICD-10-CM

## 2019-01-24 DIAGNOSIS — F329 Major depressive disorder, single episode, unspecified: Secondary | ICD-10-CM

## 2019-01-24 DIAGNOSIS — F419 Anxiety disorder, unspecified: Secondary | ICD-10-CM | POA: Diagnosis not present

## 2019-01-24 NOTE — Progress Notes (Signed)
I have discussed the procedure for the virtual visit with the patient who has given consent to proceed with assessment and treatment.   Keyshun Elpers L Ramon Zanders, CMA     

## 2019-01-24 NOTE — Progress Notes (Signed)
Virtual Visit via Video   I connected with patient on 01/24/19 at  3:00 PM EST by a video enabled telemedicine application and verified that I am speaking with the correct person using two identifiers.  Location patient: Home Location provider: Acupuncturist, Office Persons participating in the virtual visit: Patient, Provider, [place] (Jess B)  I discussed the limitations of evaluation and management by telemedicine and the availability of in person appointments. The patient expressed understanding and agreed to proceed.  Subjective:   HPI:   Anxiety/Depression- at last visit pt's Buspar was increased to 10mg  BID.  Remains on Cymbalta.  Pt reports things are 'better'.  Feels that increasing Buspar has been helpful.  Pt feels less on edge.  Pt feels sleep has improved.  Pt feels energy and motivation are both better.  ROS:   See pertinent positives and negatives per HPI.  Patient Active Problem List   Diagnosis Date Noted  . HLD (hyperlipidemia)   . Overactive bladder 05/08/2015  . Aphasia   . Cervical spondylitis ([place])   . Numbness and tingling in left hand 03/21/2015  . TIA (transient ischemic attack) 03/21/2015  . Physical exam 10/05/2014  . Abdominal pain 05/17/2014  . Allergic rhinitis 05/17/2014  . Fatigue 04/04/2014  . Anxiety and depression 07/25/2013  . Memory deficits 02/15/2013  . Lumbar spondylosis 09/21/2012  . Palpitations 09/16/2010  . Cerebrovascular disease 04/03/2008  . DYSPHONIA 04/03/2008  . Diverticulosis of large intestine 04/02/2008  . Irritable bowel syndrome 04/02/2008  . DEGENERATIVE JOINT DISEASE 04/02/2008  . HEMATURIA, MICROSCOPIC, HX OF 04/02/2008  . Vitamin D deficiency 03/28/2008  . FIBROMYALGIA 03/28/2008  . URINARY INCONTINENCE 03/28/2008  . GERD 08/02/2007  . Gastroparesis 08/02/2007  . HEMORRHOIDS 07/30/2007  . ESOPHAGEAL STRICTURE 07/30/2007    Social History   Tobacco Use  . Smoking status: Never Smoker  . Smokeless  tobacco: Never Used  Substance Use Topics  . Alcohol use: No    Alcohol/week: 0.0 standard drinks    Current Outpatient Medications:  .  ALPRAZolam (XANAX) 0.5 MG tablet, TAKE  1/2 -1 TABLET BY MOUTH THREE TIMES DAILY AS NEEDED, Disp: 90 tablet, Rfl: 2 .  aspirin (ECOTRIN) 325 MG EC tablet, Take 325 mg by mouth daily.  , Disp: , Rfl:  .  busPIRone (BUSPAR) 10 MG tablet, Take 1 tablet (10 mg total) by mouth 2 (two) times daily., Disp: 60 tablet, Rfl: 3 .  Calcium-Magnesium-Vitamin D (CALCIUM 1200+D3 PO), Take 1 tablet 2 (two) times daily by mouth., Disp: , Rfl:  .  DULoxetine (CYMBALTA) 60 MG capsule, Take 1 capsule (60 mg total) by mouth daily., Disp: 90 capsule, Rfl: 1 .  fluticasone (FLONASE) 50 MCG/ACT nasal spray, Place 2 sprays into both nostrils daily as needed for allergies., Disp: 16 g, Rfl: 3 .  Ginkgo Biloba 40 MG TABS, Take by mouth., Disp: , Rfl:  .  Iron-Vitamins (GERITOL) LIQD, Take 15 mLs by mouth daily., Disp: , Rfl:  .  KRILL OIL OMEGA-3 PO, Take 1 capsule by mouth daily., Disp: , Rfl:  .  Melatonin 5 MG CAPS, Take by mouth at bedtime., Disp: , Rfl:  .  Multiple Vitamins-Minerals (HAIR SKIN AND NAILS FORMULA PO), Take by mouth. Collagen brand, Disp: , Rfl:  .  NON FORMULARY, donmperidone, Disp: , Rfl:  .  omeprazole (PRILOSEC) 40 MG capsule, Take 1 capsule (40 mg total) by mouth 2 (two) times daily., Disp: 180 capsule, Rfl: 1 .  prednisoLONE acetate (PRED FORTE) 1 %  ophthalmic suspension, Place 1 drop into both eyes daily. , Disp: , Rfl:  .  tiZANidine (ZANAFLEX) 2 MG tablet, 1/2-1 tab every 8 hrs as needed for spasm, Disp: 30 tablet, Rfl: 0 .  tolterodine (DETROL LA) 4 MG 24 hr capsule, Take 1 capsule (4 mg total) by mouth at bedtime., Disp: 90 capsule, Rfl: 0  Allergies  Allergen Reactions  . Morphine Other (See Comments)    REACTION: lethargy/malaise  . Metoclopramide Hcl Anxiety and Other (See Comments)    REACTION: anxious and out of her mind  . Aricept [Donepezil  Hcl]     nausea  . Doxycycline Diarrhea  . Penicillins Itching and Rash    Has patient had a PCN reaction causing immediate rash, facial/tongue/throat swelling, SOB or lightheadedness with hypotension: No Has patient had a PCN reaction causing severe rash involving mucus membranes or skin necrosis: No Has patient had a PCN reaction that required hospitalization No Has patient had a PCN reaction occurring within the last 10 years: Yes If all of the above answers are "NO", then may proceed with Cephalosporin use.    . Prednisone Itching and Rash  . Sulfonamide Derivatives Itching and Rash    Objective:   BP 135/82   Pulse 77   Temp 98.3 F (36.8 C) (Oral)  AAOx3, NAD NCAT, EOMI No obvious CN deficits Coloring WNL Pt is able to speak clearly, coherently without shortness of breath or increased work of breathing.  Thought process is linear.  Mood is appropriate.   Assessment and Plan:   Anxiety/depression- improved since increasing Buspar.  Continue both Cymbalta and Buspar at current doses.  Will continue to follow going forward.  Pt expressed understanding and is in agreement w/ plan.    Annye Asa, MD 01/24/2019

## 2019-02-02 ENCOUNTER — Encounter: Payer: Self-pay | Admitting: Family Medicine

## 2019-03-04 ENCOUNTER — Telehealth: Payer: Self-pay | Admitting: Family Medicine

## 2019-03-04 NOTE — Telephone Encounter (Signed)
Absolutely.  It is recommended that as many people as possible get the vaccine.  Since she is 62, she is actually able to log onto [email or web address] to schedule an appt or call the [facility] to schedule.

## 2019-03-04 NOTE — Telephone Encounter (Signed)
Please advise 

## 2019-03-04 NOTE — Telephone Encounter (Signed)
Pt called in asking if tabori felt that the her getting the COVID injection would be safe due to her medical conditions. Pt can be reached at the home #

## 2019-03-04 NOTE — Telephone Encounter (Signed)
Called and advised pt.

## 2019-03-15 ENCOUNTER — Other Ambulatory Visit: Payer: Self-pay | Admitting: Emergency Medicine

## 2019-03-15 DIAGNOSIS — F32A Depression, unspecified: Secondary | ICD-10-CM

## 2019-03-15 DIAGNOSIS — K219 Gastro-esophageal reflux disease without esophagitis: Secondary | ICD-10-CM

## 2019-03-15 DIAGNOSIS — F329 Major depressive disorder, single episode, unspecified: Secondary | ICD-10-CM

## 2019-03-15 MED ORDER — BUSPIRONE HCL 10 MG PO TABS: 10.0000 mg | ORAL_TABLET | Freq: Two times a day (BID) | ORAL | 1 refills | Status: DC

## 2019-03-15 MED ORDER — OMEPRAZOLE 40 MG PO CPDR: 40.0000 mg | DELAYED_RELEASE_CAPSULE | Freq: Two times a day (BID) | ORAL | 1 refills | Status: DC

## 2019-03-15 MED ORDER — DULOXETINE HCL 60 MG PO CPEP: 60.0000 mg | ORAL_CAPSULE | Freq: Every day | ORAL | 1 refills | Status: DC

## 2019-03-18 DIAGNOSIS — N6321 Unspecified lump in the left breast, upper outer quadrant: Secondary | ICD-10-CM | POA: Diagnosis not present

## 2019-03-18 DIAGNOSIS — Z01419 Encounter for gynecological examination (general) (routine) without abnormal findings: Secondary | ICD-10-CM | POA: Diagnosis not present

## 2019-03-31 ENCOUNTER — Encounter: Payer: Self-pay | Admitting: Family Medicine

## 2019-03-31 DIAGNOSIS — N6012 Diffuse cystic mastopathy of left breast: Secondary | ICD-10-CM | POA: Diagnosis not present

## 2019-03-31 LAB — HM MAMMOGRAPHY

## 2019-05-06 ENCOUNTER — Encounter: Payer: Self-pay | Admitting: General Practice

## 2019-07-11 DIAGNOSIS — M8589 Other specified disorders of bone density and structure, multiple sites: Secondary | ICD-10-CM | POA: Diagnosis not present

## 2019-08-11 DIAGNOSIS — Z77098 Contact with and (suspected) exposure to other hazardous, chiefly nonmedicinal, chemicals: Secondary | ICD-10-CM | POA: Diagnosis not present

## 2019-08-11 DIAGNOSIS — H04123 Dry eye syndrome of bilateral lacrimal glands: Secondary | ICD-10-CM | POA: Diagnosis not present

## 2019-08-11 DIAGNOSIS — Z9889 Other specified postprocedural states: Secondary | ICD-10-CM | POA: Diagnosis not present

## 2019-08-11 DIAGNOSIS — H1830 Unspecified corneal membrane change: Secondary | ICD-10-CM | POA: Diagnosis not present

## 2019-08-11 DIAGNOSIS — Z947 Corneal transplant status: Secondary | ICD-10-CM | POA: Diagnosis not present

## 2019-08-11 DIAGNOSIS — H2513 Age-related nuclear cataract, bilateral: Secondary | ICD-10-CM | POA: Diagnosis not present

## 2019-08-11 DIAGNOSIS — H2512 Age-related nuclear cataract, left eye: Secondary | ICD-10-CM | POA: Diagnosis not present

## 2019-08-11 DIAGNOSIS — T596X1A Toxic effect of hydrogen sulfide, accidental (unintentional), initial encounter: Secondary | ICD-10-CM | POA: Diagnosis not present

## 2019-08-11 DIAGNOSIS — H25811 Combined forms of age-related cataract, right eye: Secondary | ICD-10-CM | POA: Diagnosis not present

## 2019-08-16 DIAGNOSIS — Z9889 Other specified postprocedural states: Secondary | ICD-10-CM | POA: Diagnosis not present

## 2019-08-16 DIAGNOSIS — H1830 Unspecified corneal membrane change: Secondary | ICD-10-CM | POA: Diagnosis not present

## 2019-08-16 DIAGNOSIS — H2512 Age-related nuclear cataract, left eye: Secondary | ICD-10-CM | POA: Diagnosis not present

## 2019-08-16 DIAGNOSIS — Z77098 Contact with and (suspected) exposure to other hazardous, chiefly nonmedicinal, chemicals: Secondary | ICD-10-CM | POA: Diagnosis not present

## 2019-08-16 DIAGNOSIS — H18603 Keratoconus, unspecified, bilateral: Secondary | ICD-10-CM | POA: Diagnosis not present

## 2019-08-16 DIAGNOSIS — H25811 Combined forms of age-related cataract, right eye: Secondary | ICD-10-CM | POA: Diagnosis not present

## 2019-08-20 DIAGNOSIS — M7989 Other specified soft tissue disorders: Secondary | ICD-10-CM | POA: Diagnosis not present

## 2019-08-20 DIAGNOSIS — K219 Gastro-esophageal reflux disease without esophagitis: Secondary | ICD-10-CM | POA: Diagnosis not present

## 2019-08-20 DIAGNOSIS — R29898 Other symptoms and signs involving the musculoskeletal system: Secondary | ICD-10-CM | POA: Diagnosis not present

## 2019-08-20 DIAGNOSIS — M797 Fibromyalgia: Secondary | ICD-10-CM | POA: Insufficient documentation

## 2019-08-20 DIAGNOSIS — R600 Localized edema: Secondary | ICD-10-CM | POA: Diagnosis not present

## 2019-08-20 DIAGNOSIS — Z8673 Personal history of transient ischemic attack (TIA), and cerebral infarction without residual deficits: Secondary | ICD-10-CM | POA: Diagnosis not present

## 2019-08-20 DIAGNOSIS — M79605 Pain in left leg: Secondary | ICD-10-CM | POA: Diagnosis not present

## 2019-08-20 DIAGNOSIS — R0989 Other specified symptoms and signs involving the circulatory and respiratory systems: Secondary | ICD-10-CM | POA: Diagnosis not present

## 2019-08-24 DIAGNOSIS — Z77098 Contact with and (suspected) exposure to other hazardous, chiefly nonmedicinal, chemicals: Secondary | ICD-10-CM | POA: Diagnosis not present

## 2019-08-24 DIAGNOSIS — H04123 Dry eye syndrome of bilateral lacrimal glands: Secondary | ICD-10-CM | POA: Diagnosis not present

## 2019-08-24 DIAGNOSIS — H1830 Unspecified corneal membrane change: Secondary | ICD-10-CM | POA: Diagnosis not present

## 2019-08-24 DIAGNOSIS — Z9889 Other specified postprocedural states: Secondary | ICD-10-CM | POA: Diagnosis not present

## 2019-08-24 DIAGNOSIS — H18603 Keratoconus, unspecified, bilateral: Secondary | ICD-10-CM | POA: Diagnosis not present

## 2019-08-24 DIAGNOSIS — Z8669 Personal history of other diseases of the nervous system and sense organs: Secondary | ICD-10-CM | POA: Diagnosis not present

## 2019-08-24 DIAGNOSIS — H2513 Age-related nuclear cataract, bilateral: Secondary | ICD-10-CM | POA: Diagnosis not present

## 2019-09-05 DIAGNOSIS — H2513 Age-related nuclear cataract, bilateral: Secondary | ICD-10-CM | POA: Diagnosis not present

## 2019-09-05 DIAGNOSIS — H18603 Keratoconus, unspecified, bilateral: Secondary | ICD-10-CM | POA: Diagnosis not present

## 2019-09-05 DIAGNOSIS — Z947 Corneal transplant status: Secondary | ICD-10-CM | POA: Diagnosis not present

## 2019-09-05 DIAGNOSIS — H2512 Age-related nuclear cataract, left eye: Secondary | ICD-10-CM | POA: Diagnosis not present

## 2019-09-05 DIAGNOSIS — H04123 Dry eye syndrome of bilateral lacrimal glands: Secondary | ICD-10-CM | POA: Diagnosis not present

## 2019-09-05 DIAGNOSIS — H1830 Unspecified corneal membrane change: Secondary | ICD-10-CM | POA: Diagnosis not present

## 2019-09-05 DIAGNOSIS — Z77098 Contact with and (suspected) exposure to other hazardous, chiefly nonmedicinal, chemicals: Secondary | ICD-10-CM | POA: Diagnosis not present

## 2019-09-05 DIAGNOSIS — H16143 Punctate keratitis, bilateral: Secondary | ICD-10-CM | POA: Diagnosis not present

## 2019-09-05 DIAGNOSIS — H25811 Combined forms of age-related cataract, right eye: Secondary | ICD-10-CM | POA: Diagnosis not present

## 2019-09-05 DIAGNOSIS — Z9889 Other specified postprocedural states: Secondary | ICD-10-CM | POA: Diagnosis not present

## 2019-09-07 ENCOUNTER — Other Ambulatory Visit: Payer: Self-pay | Admitting: Family Medicine

## 2019-09-07 DIAGNOSIS — F419 Anxiety disorder, unspecified: Secondary | ICD-10-CM

## 2019-09-07 DIAGNOSIS — F32A Depression, unspecified: Secondary | ICD-10-CM

## 2019-11-28 DIAGNOSIS — J392 Other diseases of pharynx: Secondary | ICD-10-CM | POA: Insufficient documentation

## 2019-11-28 DIAGNOSIS — H6123 Impacted cerumen, bilateral: Secondary | ICD-10-CM | POA: Diagnosis not present

## 2019-11-28 DIAGNOSIS — R0989 Other specified symptoms and signs involving the circulatory and respiratory systems: Secondary | ICD-10-CM | POA: Diagnosis not present

## 2019-11-28 DIAGNOSIS — J343 Hypertrophy of nasal turbinates: Secondary | ICD-10-CM | POA: Diagnosis not present

## 2019-12-03 ENCOUNTER — Other Ambulatory Visit: Payer: Self-pay | Admitting: Family Medicine

## 2019-12-03 DIAGNOSIS — F419 Anxiety disorder, unspecified: Secondary | ICD-10-CM

## 2019-12-03 DIAGNOSIS — F32A Depression, unspecified: Secondary | ICD-10-CM

## 2019-12-08 DIAGNOSIS — Z20822 Contact with and (suspected) exposure to covid-19: Secondary | ICD-10-CM | POA: Diagnosis not present

## 2019-12-29 ENCOUNTER — Telehealth: Payer: Self-pay

## 2019-12-29 NOTE — Telephone Encounter (Signed)
Patient called stating she would like to know if someone could call her with the results on her Dexa on 07/11/19.  Dexa was completed with [facility].  Patient states she was informed by [facility] in May that her provider would reach out to her with these results.    Patient would also like to know if Dr. [name] believes she should get the Shingrix vaccination?   (Patient has medicare and would need to reach out to her Pharmacy for vaccination)

## 2019-12-29 NOTE — Telephone Encounter (Signed)
Patient called and is asking for Dexa scan results from 07/11/2019 which was performed at [facility]. Joneen Caraway is the provider. There is no dexa scan result showing only the test. She also wants to know if she should get the shingles vaccine from the pharmacy. Please advise!

## 2019-12-30 NOTE — Telephone Encounter (Signed)
I have a copy of this bone density scan but this was ordered by Dr Garwin Brothers (OB/GYN).  Typically we don't share/discuss results w/ patients if the labs/tests are ordered by other physicians.  I would contact Dr Joseph Pierini office if you have not heard anything.  In regards to the shingles vaccine, she can certainly get this at the pharmacy.  I do caution her that this can cause flu like sxs and given the current COVID situation this can be confusing.  So it would also be ok to wait until spring

## 2019-12-30 NOTE — Telephone Encounter (Signed)
FYI:  Patient called back and information was given per PCP. Patient states she expected to hear her results from our office as we are her PCP. It was explained to the patient that her OB/GYN doctor ordered the test and they typically call with results and to reach out to their office.Patient was unhappy with information and hung up before I could give her the information on the shingles vaccine.

## 2019-12-30 NOTE — Telephone Encounter (Signed)
Left message for patient to return call to office. 

## 2020-02-02 DIAGNOSIS — Z1231 Encounter for screening mammogram for malignant neoplasm of breast: Secondary | ICD-10-CM | POA: Diagnosis not present

## 2020-02-14 ENCOUNTER — Encounter: Payer: Self-pay | Admitting: Family Medicine

## 2020-02-14 ENCOUNTER — Other Ambulatory Visit: Payer: Self-pay

## 2020-02-14 DIAGNOSIS — K219 Gastro-esophageal reflux disease without esophagitis: Secondary | ICD-10-CM

## 2020-02-14 DIAGNOSIS — R922 Inconclusive mammogram: Secondary | ICD-10-CM | POA: Diagnosis not present

## 2020-02-14 DIAGNOSIS — R928 Other abnormal and inconclusive findings on diagnostic imaging of breast: Secondary | ICD-10-CM | POA: Diagnosis not present

## 2020-02-14 MED ORDER — OMEPRAZOLE 40 MG PO CPDR: 40.0000 mg | DELAYED_RELEASE_CAPSULE | Freq: Two times a day (BID) | ORAL | 0 refills | Status: DC

## 2020-03-05 ENCOUNTER — Other Ambulatory Visit: Payer: Medicare HMO

## 2020-03-05 DIAGNOSIS — Z20822 Contact with and (suspected) exposure to covid-19: Secondary | ICD-10-CM | POA: Diagnosis not present

## 2020-03-08 LAB — NOVEL CORONAVIRUS, NAA: SARS-CoV-2, NAA: NOT DETECTED

## 2020-03-21 ENCOUNTER — Other Ambulatory Visit: Payer: Self-pay | Admitting: Family Medicine

## 2020-03-21 DIAGNOSIS — F32A Depression, unspecified: Secondary | ICD-10-CM

## 2020-05-03 DIAGNOSIS — R159 Full incontinence of feces: Secondary | ICD-10-CM | POA: Diagnosis not present

## 2020-05-03 DIAGNOSIS — K219 Gastro-esophageal reflux disease without esophagitis: Secondary | ICD-10-CM | POA: Diagnosis not present

## 2020-05-03 DIAGNOSIS — K625 Hemorrhage of anus and rectum: Secondary | ICD-10-CM | POA: Diagnosis not present

## 2020-05-10 DIAGNOSIS — H2513 Age-related nuclear cataract, bilateral: Secondary | ICD-10-CM | POA: Diagnosis not present

## 2020-05-10 DIAGNOSIS — Z947 Corneal transplant status: Secondary | ICD-10-CM | POA: Diagnosis not present

## 2020-05-10 DIAGNOSIS — H5711 Ocular pain, right eye: Secondary | ICD-10-CM | POA: Diagnosis not present

## 2020-05-22 ENCOUNTER — Encounter (HOSPITAL_COMMUNITY): Payer: Self-pay | Admitting: Emergency Medicine

## 2020-05-22 ENCOUNTER — Other Ambulatory Visit: Payer: Self-pay

## 2020-05-22 ENCOUNTER — Ambulatory Visit (HOSPITAL_COMMUNITY)
Admission: EM | Admit: 2020-05-22 | Discharge: 2020-05-22 | Disposition: A | Discharge status: Home / Self Care | Payer: Medicare HMO | Attending: Urgent Care | Admitting: Urgent Care

## 2020-05-22 ENCOUNTER — Ambulatory Visit (INDEPENDENT_AMBULATORY_CARE_PROVIDER_SITE_OTHER): Payer: Medicare HMO

## 2020-05-22 DIAGNOSIS — R531 Weakness: Secondary | ICD-10-CM | POA: Diagnosis not present

## 2020-05-22 DIAGNOSIS — M79605 Pain in left leg: Secondary | ICD-10-CM | POA: Diagnosis not present

## 2020-05-22 DIAGNOSIS — R29898 Other symptoms and signs involving the musculoskeletal system: Secondary | ICD-10-CM | POA: Diagnosis not present

## 2020-05-22 DIAGNOSIS — Z8739 Personal history of other diseases of the musculoskeletal system and connective tissue: Secondary | ICD-10-CM

## 2020-05-22 DIAGNOSIS — M6281 Muscle weakness (generalized): Secondary | ICD-10-CM

## 2020-05-22 DIAGNOSIS — M5136 Other intervertebral disc degeneration, lumbar region: Secondary | ICD-10-CM

## 2020-05-22 DIAGNOSIS — M5137 Other intervertebral disc degeneration, lumbosacral region: Secondary | ICD-10-CM | POA: Diagnosis not present

## 2020-05-22 DIAGNOSIS — M419 Scoliosis, unspecified: Secondary | ICD-10-CM

## 2020-05-22 DIAGNOSIS — M79604 Pain in right leg: Secondary | ICD-10-CM | POA: Diagnosis not present

## 2020-05-22 DIAGNOSIS — M4186 Other forms of scoliosis, lumbar region: Secondary | ICD-10-CM | POA: Diagnosis not present

## 2020-05-22 NOTE — ED Provider Notes (Signed)
[place]   MRN: [id number] DOB: 01-May-1940  Subjective:   Paige Pena is a 80 y.o. female presenting for 4-day history of acute onset bilateral lower leg pain, weakness.  Patient states that the symptoms have improved and she actually does not have pain anymore but still feels little weak on her legs.  Denies any redness, swelling, warmth, erythema.  No history of blood clots.  On chart review she does have a significant history of lumbar spondylosis, multiple bulging disks in the lumbar region, foraminal stenosis seen on an MRI in 2014.  She does see a neurologist that she has a history of cerebral vascular disease, history of TIA.  Denies any particular headache, confusion, chest pain, abdominal pain, falls, trauma.  She used a topical homeopathic medication with some relief.  Also has a history of fibromyalgia.  She is looking to establish care with a new rheumatologist.  No current facility-administered medications for this encounter.  Current Outpatient Medications:  .  ALPRAZolam (XANAX) 0.5 MG tablet, TAKE  1/2 -1 TABLET BY MOUTH THREE TIMES DAILY AS NEEDED, Disp: 90 tablet, Rfl: 2 .  aspirin (ECOTRIN) 325 MG EC tablet, Take 325 mg by mouth daily.  , Disp: , Rfl:  .  busPIRone (BUSPAR) 10 MG tablet, TAKE 1 TABLET TWICE DAILY, Disp: 180 tablet, Rfl: 1 .  Calcium-Magnesium-Vitamin D (CALCIUM 1200+D3 PO), Take 1 tablet 2 (two) times daily by mouth., Disp: , Rfl:  .  DULoxetine (CYMBALTA) 60 MG capsule, TAKE 1 CAPSULE EVERY DAY, Disp: 90 capsule, Rfl: 0 .  fluticasone (FLONASE) 50 MCG/ACT nasal spray, Place 2 sprays into both nostrils daily as needed for allergies., Disp: 16 g, Rfl: 3 .  Ginkgo Biloba 40 MG TABS, Take by mouth., Disp: , Rfl:  .  Iron-Vitamins (GERITOL) LIQD, Take 15 mLs by mouth daily., Disp: , Rfl:  .  KRILL OIL OMEGA-3 PO, Take 1 capsule by mouth daily., Disp: , Rfl:  .  Melatonin 5 MG CAPS, Take by mouth at bedtime., Disp: , Rfl:  .  Multiple  Vitamins-Minerals (HAIR SKIN AND NAILS FORMULA PO), Take by mouth. Collagen brand, Disp: , Rfl:  .  NON FORMULARY, donmperidone, Disp: , Rfl:  .  omeprazole (PRILOSEC) 40 MG capsule, Take 1 capsule (40 mg total) by mouth 2 (two) times daily., Disp: 180 capsule, Rfl: 0 .  prednisoLONE acetate (PRED FORTE) 1 % ophthalmic suspension, Place 1 drop into both eyes daily. , Disp: , Rfl:  .  tiZANidine (ZANAFLEX) 2 MG tablet, 1/2-1 tab every 8 hrs as needed for spasm, Disp: 30 tablet, Rfl: 0 .  tolterodine (DETROL LA) 4 MG 24 hr capsule, Take 1 capsule (4 mg total) by mouth at bedtime., Disp: 90 capsule, Rfl: 0   Allergies  Allergen Reactions  . Morphine Other (See Comments)    REACTION: lethargy/malaise  . Metoclopramide Hcl Anxiety and Other (See Comments)    REACTION: anxious and out of her mind  . Aricept [Donepezil Hcl]     nausea  . Doxycycline Diarrhea  . Penicillins Itching and Rash    Has patient had a PCN reaction causing immediate rash, facial/tongue/throat swelling, SOB or lightheadedness with hypotension: No Has patient had a PCN reaction causing severe rash involving mucus membranes or skin necrosis: No Has patient had a PCN reaction that required hospitalization No Has patient had a PCN reaction occurring within the last 10 years: Yes If all of the above answers are "NO", then may  proceed with Cephalosporin use.    . Prednisone Itching and Rash  . Sulfonamide Derivatives Itching and Rash    Past Medical History:  Diagnosis Date  . Acute cystitis   . Anxiety   . Aphasia    transsient aphasia Jan 2012  . Asthmatic bronchitis   . Cerebrovascular disease   . Chest pain, atypical   . Diverticulosis of colon   . DJD (degenerative joint disease)   . Dysphonia   . Esophageal stricture   . Fibromyalgia   . Gastroparesis   . GERD (gastroesophageal reflux disease)   . Hematuria, microscopic   . Hemorrhoids   . Hypercholesteremia   . IBS (irritable bowel syndrome)   .  Memory deficits 02/15/2013  . Memory loss   . RLS (restless legs syndrome)   . TIA (transient ischemic attack)   . Urinary incontinence   . Vitamin D deficiency      Past Surgical History:  Procedure Laterality Date  . ABDOMINAL HYSTERECTOMY  1995  . corneal transplant for keratonconus    . EYE SURGERY  2013  . left foot fracture with ORIF  07/2004   Dr. Sharol Given  . right bunion surgery      Family History  Problem Relation Age of Onset  . Hyperlipidemia Sister   . Diabetes Brother   . Cancer Brother        lung  . Hypertension Mother   . Anemia Mother   . Dementia Mother   . Kidney disease Father   . Breast cancer Other        aunt  . Diabetes Other        aunt,brother,uncle  . Kidney disease Other        father  . Dementia Maternal Grandmother     Social History   Tobacco Use  . Smoking status: Never Smoker  . Smokeless tobacco: Never Used  Substance Use Topics  . Alcohol use: No    Alcohol/week: 0.0 standard drinks  . Drug use: No    ROS   Objective:   Vitals: BP 140/80 (BP Location: Right Arm)   Pulse 80   Temp 98.4 F (36.9 C) (Oral)   Resp 18   SpO2 99%   Physical Exam Constitutional:      General: She is not in acute distress.    Appearance: Normal appearance. She is well-developed. She is not ill-appearing, toxic-appearing or diaphoretic.  HENT:     Head: Normocephalic and atraumatic.     Nose: Nose normal.     Mouth/Throat:     Mouth: Mucous membranes are moist.     Pharynx: Oropharynx is clear.  Eyes:     General: No scleral icterus.       Right eye: No discharge.        Left eye: No discharge.     Extraocular Movements: Extraocular movements intact.     Conjunctiva/sclera: Conjunctivae normal.     Pupils: Pupils are equal, round, and reactive to light.  Cardiovascular:     Rate and Rhythm: Normal rate.  Pulmonary:     Effort: Pulmonary effort is normal.  Musculoskeletal:     Right knee: No swelling, deformity, erythema,  ecchymosis, lacerations or bony tenderness. Normal range of motion. No tenderness.     Left knee: No swelling, deformity, erythema, ecchymosis, lacerations or bony tenderness. Normal range of motion. No tenderness.     Right lower leg: No swelling, deformity, lacerations, tenderness or bony tenderness. No edema.  Left lower leg: No swelling, deformity, lacerations, tenderness or bony tenderness. No edema.     Right ankle: No swelling, deformity, ecchymosis or lacerations. No tenderness. Normal range of motion.     Left ankle: No swelling, deformity, ecchymosis or lacerations. No tenderness. Normal range of motion.  Skin:    General: Skin is warm and dry.  Neurological:     General: No focal deficit present.     Mental Status: She is alert and oriented to person, place, and time.     Motor: No weakness.     Coordination: Coordination normal.     Gait: Gait normal.     Deep Tendon Reflexes: Reflexes normal.  Psychiatric:        Mood and Affect: Mood normal.        Behavior: Behavior normal.        Thought Content: Thought content normal.        Judgment: Judgment normal.     DG Lumbar Spine Complete  Result Date: 05/22/2020 CLINICAL DATA:  Lower extremity weakness. EXAM: LUMBAR SPINE - COMPLETE 4+ VIEW COMPARISON:  None. FINDINGS: Moderate levo scoliotic curvature of the lower thoracic and lumbar spine. Suspected mild anterolisthesis of L3 on L4 and L4 on L5. Diffuse degenerative disc disease with disc space narrowing and endplate spurring, most prominently affecting L5-S1. There is prominent facet hypertrophy at L3-L4 and L4-L5. Vertebral body heights are normal. No evidence of fracture or focal bone lesion. The sacroiliac joints are congruent. IMPRESSION: 1. Moderate scoliosis and multilevel degenerative disc disease. 2. Prominent facet hypertrophy at L3-L4 and L4-L5. Electronically Signed   By: Keith Rake M.D.   On: 05/22/2020 15:47   Assessment and Plan :   PDMP not reviewed  this encounter.  1. Leg weakness, bilateral   2. Bilateral leg pain   3. Degenerative disc disease, lumbar   4. Scoliosis, unspecified scoliosis type, unspecified spinal region   5. History of herniated intervertebral disc     Counseled patient that we will attempt to address her symptoms of bilateral lower leg pain and weakness as related to her history of foraminal stenosis, multilevel degenerative disc disease of the lumbar region.  She does not have any focal physical exam findings, has negative Bevelyn Buckles' sign and given bilateral nature of her symptoms have low suspicion of DVT, cellulitis.  Will use oral prednisone, follow-up very closely with neurology. Counseled patient on potential for adverse effects with medications prescribed/recommended today, ER and return-to-clinic precautions discussed, patient verbalized understanding.    Jaynee Eagles, PA-C 05/22/20 1718

## 2020-05-22 NOTE — ED Triage Notes (Signed)
Patient presents to [facility] for evaluation after having 10/10 bilateral leg pain on Saturday evening before bed. Used a topical salve, which helped.  Woke up the next morning and her legs felt light and like "my muscles had dwindled away".  States last night she noted swelling to her right leg, elevated her legs for bed and swelling resolved today.  Pain to right knee at this time, denies other pain.  Denies swelling sensation now, 1+ edema to right leg.  Patient denies SOB and CP

## 2020-05-23 ENCOUNTER — Other Ambulatory Visit: Payer: Self-pay | Admitting: Family Medicine

## 2020-05-23 DIAGNOSIS — F32A Depression, unspecified: Secondary | ICD-10-CM

## 2020-05-23 DIAGNOSIS — F419 Anxiety disorder, unspecified: Secondary | ICD-10-CM

## 2020-06-07 ENCOUNTER — Encounter: Payer: Medicare HMO | Admitting: Family Medicine

## 2020-06-13 ENCOUNTER — Other Ambulatory Visit: Payer: Self-pay

## 2020-06-13 ENCOUNTER — Encounter: Payer: Self-pay | Admitting: Family Medicine

## 2020-06-13 ENCOUNTER — Ambulatory Visit (INDEPENDENT_AMBULATORY_CARE_PROVIDER_SITE_OTHER): Payer: Medicare HMO | Admitting: Family Medicine

## 2020-06-13 VITALS — BP 130/80 | Temp 97.6°F | Resp 18 | Ht 61.0 in | Wt 123.8 lb

## 2020-06-13 DIAGNOSIS — E559 Vitamin D deficiency, unspecified: Secondary | ICD-10-CM | POA: Diagnosis not present

## 2020-06-13 DIAGNOSIS — Z0000 Encounter for general adult medical examination without abnormal findings: Secondary | ICD-10-CM

## 2020-06-13 DIAGNOSIS — I679 Cerebrovascular disease, unspecified: Secondary | ICD-10-CM | POA: Diagnosis not present

## 2020-06-13 DIAGNOSIS — R202 Paresthesia of skin: Secondary | ICD-10-CM | POA: Diagnosis not present

## 2020-06-13 DIAGNOSIS — R200 Anesthesia of skin: Secondary | ICD-10-CM

## 2020-06-13 DIAGNOSIS — F32A Depression, unspecified: Secondary | ICD-10-CM

## 2020-06-13 DIAGNOSIS — F419 Anxiety disorder, unspecified: Secondary | ICD-10-CM | POA: Diagnosis not present

## 2020-06-13 DIAGNOSIS — M25561 Pain in right knee: Secondary | ICD-10-CM

## 2020-06-13 LAB — CBC WITH DIFFERENTIAL/PLATELET
Basophils Absolute: 0 10*3/uL (ref 0.0–0.1)
Basophils Relative: 0.9 % (ref 0.0–3.0)
Eosinophils Absolute: 0.1 10*3/uL (ref 0.0–0.7)
Eosinophils Relative: 2.4 % (ref 0.0–5.0)
HCT: 40.9 % (ref 36.0–46.0)
Hemoglobin: 13.5 g/dL (ref 12.0–15.0)
Lymphocytes Relative: 29 % (ref 12.0–46.0)
Lymphs Abs: 1.4 10*3/uL (ref 0.7–4.0)
MCHC: 33.1 g/dL (ref 30.0–36.0)
MCV: 90.9 fl (ref 78.0–100.0)
Monocytes Absolute: 0.5 10*3/uL (ref 0.1–1.0)
Monocytes Relative: 9.7 % (ref 3.0–12.0)
Neutro Abs: 2.9 10*3/uL (ref 1.4–7.7)
Neutrophils Relative %: 58 % (ref 43.0–77.0)
Platelets: 148 10*3/uL — ABNORMAL LOW (ref 150.0–400.0)
RBC: 4.5 Mil/uL (ref 3.87–5.11)
RDW: 13.6 % (ref 11.5–15.5)
WBC: 4.9 10*3/uL (ref 4.0–10.5)

## 2020-06-13 LAB — B12 AND FOLATE PANEL
Folate: 19.1 ng/mL (ref >5.9)
Vitamin B-12: 973 pg/mL — ABNORMAL HIGH (ref 211–911)

## 2020-06-13 LAB — BASIC METABOLIC PANEL
BUN: 18 mg/dL (ref 6–23)
CO2: 31 mEq/L (ref 19–32)
Calcium: 9.5 mg/dL (ref 8.4–10.5)
Chloride: 102 mEq/L (ref 96–112)
Creatinine, Ser: 0.94 mg/dL (ref 0.40–1.20)
GFR: 57.09 mL/min — ABNORMAL LOW (ref >60.00)
Glucose, Bld: 86 mg/dL (ref 70–99)
Potassium: 3.7 mEq/L (ref 3.5–5.1)
Sodium: 140 mEq/L (ref 135–145)

## 2020-06-13 LAB — HEPATIC FUNCTION PANEL
ALT: 12 U/L (ref 0–35)
AST: 23 U/L (ref 0–37)
Albumin: 4 g/dL (ref 3.5–5.2)
Alkaline Phosphatase: 69 U/L (ref 39–117)
Bilirubin, Direct: 0.1 mg/dL (ref 0.0–0.3)
Total Bilirubin: 0.4 mg/dL (ref 0.2–1.2)
Total Protein: 7.1 g/dL (ref 6.0–8.3)

## 2020-06-13 LAB — LIPID PANEL
Cholesterol: 221 mg/dL — ABNORMAL HIGH (ref 0–200)
HDL: 82.5 mg/dL (ref >39.00)
LDL Cholesterol: 126 mg/dL — ABNORMAL HIGH (ref 0–99)
NonHDL: 138.95
Total CHOL/HDL Ratio: 3
Triglycerides: 66 mg/dL (ref 0.0–149.0)
VLDL: 13.2 mg/dL (ref 0.0–40.0)

## 2020-06-13 LAB — TSH: TSH: 2.76 u[IU]/mL (ref 0.35–4.50)

## 2020-06-13 LAB — VITAMIN D 25 HYDROXY (VIT D DEFICIENCY, FRACTURES): VITD: 29.78 ng/mL — ABNORMAL LOW (ref 30.00–100.00)

## 2020-06-13 NOTE — Patient Instructions (Addendum)
Follow up in 1 year or as needed We'll notify you of your lab results and make any changes if needed Keep up the good work on healthy diet- you look great! Call and schedule a follow up w/ Neurology Schedule your COVID booster at your convenience Call with any questions or concerns Stay Safe!  Stay Healthy! Happy [employer] Birthday!!

## 2020-06-13 NOTE — Progress Notes (Signed)
Subjective:    Patient ID: Paige Pena, female    DOB: 1940-12-10, 80 y.o.   MRN: [id number]  HPI CPE- no longer having colon cancer screening.  UTD on DEXA, mammo.  UTD on pneumonia vaccines.  UTD on COVID.  Pt is due for shingles vaccine and COVID booster  Reviewed past medical, surgical, family and social histories.   Patient Care Team    Relationship Specialty Notifications Start End  [name], MD PCP - General Family Medicine  07/25/13    Comment: Gabriel Cirri, Elon Alas, MD Consulting Physician Neurology  10/05/14   Kathreen Cosier, MD  Internal Medicine  12/31/16    Comment: Linus Galas, MD Consulting Physician Dermatology  12/31/16   [facility]    12/31/16   Belva Crome, MD Consulting Physician Cardiology  12/31/16   Marilynne Halsted, MD Referring Physician Ophthalmology  12/31/16     Health Maintenance  Topic Date Due  . COVID-19 Vaccine (1) Never done  . TETANUS/TDAP  Never done  . MAMMOGRAM  02/23/2021 (Originally 03/30/2020)  . INFLUENZA VACCINE  09/24/2020  . DEXA SCAN  07/10/2021  . PNA vac Low Risk Adult  Completed  . HPV VACCINES  Aged Out      Review of Systems Patient reports no vision/hearing changes, adenopathy,fever, weight change, persistant/recurrent hoarseness, swallowing issues, chest pain, palpitations, edema, persistant/recurrent cough, hemoptysis, dyspnea (rest/exertional/paroxysmal nocturnal), gastrointestinal bleeding (melena, rectal bleeding), abdominal pain, significant heartburn, bowel changes, GU symptoms (dysuria, hematuria, incontinence), Gyn symptoms (abnormal  bleeding, pain), syncope, focal weakness, memory loss, skin/nail changes, abnormal bruising or bleeding.   + neuropathy in feet + hair loss + R knee pain  This visit occurred during the SARS-CoV-2 public health emergency.  Safety protocols were in place, including screening questions prior to the visit, additional usage  of staff PPE, and extensive cleaning of exam room while observing appropriate contact time as indicated for disinfecting solutions.       Objective:   Physical Exam General Appearance:    Alert, cooperative, no distress, appears stated age  Head:    Normocephalic, without obvious abnormality, atraumatic  Eyes:    PERRL, conjunctiva, R lens implant, EOM's intact, fundi    benign, both eyes  Ears:    Normal TM's and external ear canals, both ears  Nose:   Deferred due to COVID  Throat:   Neck:   Supple, symmetrical, trachea midline, no adenopathy;    Thyroid: no enlargement/tenderness/nodules  Back:     Symmetric, no curvature, ROM normal, no CVA tenderness  Lungs:     Clear to auscultation bilaterally, respirations unlabored  Chest Wall:    No tenderness or deformity   Heart:    Regular rate and rhythm, S1 and S2 normal, no murmur, rub   or gallop  Breast Exam:    Deferred to GYN  Abdomen:     Soft, non-tender, bowel sounds active all four quadrants,    no masses, no organomegaly  Genitalia:    Deferred to GYN  Rectal:    Extremities:   Extremities normal, atraumatic, no cyanosis or edema  Pulses:   2+ and symmetric all extremities  Skin:   Skin color, texture, turgor normal, no rashes or lesions  Lymph nodes:   Cervical, supraclavicular, and axillary nodes normal  Neurologic:   CNII-XII intact, normal strength, sensation and reflexes    throughout  Assessment & Plan:

## 2020-06-14 ENCOUNTER — Other Ambulatory Visit: Payer: Self-pay

## 2020-06-14 ENCOUNTER — Telehealth: Payer: Self-pay | Admitting: Family Medicine

## 2020-06-14 MED ORDER — VITAMIN D (ERGOCALCIFEROL) 1.25 MG (50000 UNIT) PO CAPS: 50000.0000 [IU] | ORAL_CAPSULE | ORAL | 0 refills | Status: DC

## 2020-06-14 NOTE — Telephone Encounter (Signed)
Called patient in reference to labs. Patient voiced understanding. Lab work mailed out to patient per her request.

## 2020-06-14 NOTE — Telephone Encounter (Signed)
Patient has questions about labs.  Please advise

## 2020-06-21 ENCOUNTER — Ambulatory Visit (INDEPENDENT_AMBULATORY_CARE_PROVIDER_SITE_OTHER): Payer: Medicare HMO

## 2020-06-21 ENCOUNTER — Ambulatory Visit (INDEPENDENT_AMBULATORY_CARE_PROVIDER_SITE_OTHER): Payer: Medicare HMO | Admitting: Orthopedic Surgery

## 2020-06-21 DIAGNOSIS — M79672 Pain in left foot: Secondary | ICD-10-CM | POA: Diagnosis not present

## 2020-06-21 DIAGNOSIS — M25561 Pain in right knee: Secondary | ICD-10-CM

## 2020-06-25 ENCOUNTER — Encounter: Payer: Self-pay | Admitting: Orthopedic Surgery

## 2020-06-25 NOTE — Progress Notes (Signed)
Office Visit Note   Patient: Paige Pena           Date of Birth: 10-Oct-1940           MRN: [id number] Visit Date: 06/21/2020              Requested by: [name], MD [address] [place],   [address] PCP: [name], MD  Chief Complaint  Patient presents with  . Right Knee - Pain      HPI: Patient is an 80 year old woman who presents complaining of left bunion as well as right knee pain.  Patient feels like she may have pain from moving screws.  Assessment & Plan: Visit Diagnoses:  1. Right knee pain, unspecified chronicity   2. Pain in left foot     Plan: Discussed that the screws are stable and that the prominences are primarily hypertrophic bone from the fusion.  Discussed that she does have a stable bunion without ulceration would recommend extrawide shoes and recommend against bunion surgery.  Recommended exercise for strengthening of her knee as well as Voltaren gel topically.  Follow-Up Instructions: No follow-ups on file.   Ortho Exam  Patient is alert, oriented, no adenopathy, well-dressed, normal affect, normal respiratory effort. Examination patient does have some areas of prominence on the dorsum of her foot which appears to be secondary to bone formation over the retained hardware.  There is no redness no cellulitis no pain with attempted distraction across the left foot.  Examination the right knee she has full range of motion she has stable collaterals and cruciates there is no effusion no tenderness to palpation.  Imaging: XR Foot 2 Views Left  Result Date: 06/25/2020 2 view radiographs of the left foot shows stable internal fixation for the Lisfranc fusion with callus formation.  No images are attached to the encounter.  Labs: Lab Results  Component Value Date   HGBA1C 5.6 03/21/2015   HGBA1C (H) 03/08/2010    5.7 (NOTE)                                                                       According to the ADA  Clinical Practice Recommendations for 2011, when HbA1c is used as a screening test:   >=6.5%   Diagnostic of Diabetes Mellitus           (if abnormal result  is confirmed)  5.7-6.4%   Increased risk of developing Diabetes Mellitus  References:Diagnosis and Classification of Diabetes Mellitus,Diabetes HALP,3790,24(OXBDZ 1):S62-S69 and Standards of Medical Care in         Diabetes - 2011,Diabetes HGDJ,2426,83  (Suppl 1):S11-S61.   ESRSEDRATE 10 01/13/2011   ESRSEDRATE 16 09/16/2010   ESRSEDRATE 61 (H) 07/16/2010   REPTSTATUS 05/09/2015 FINAL 05/08/2015   CULT MULTIPLE SPECIES PRESENT, SUGGEST RECOLLECTION 05/08/2015   LABORGA ESCHERICHIA COLI 03/20/2015     Lab Results  Component Value Date   ALBUMIN 4.0 06/13/2020   ALBUMIN 4.1 02/04/2018   ALBUMIN 4.1 04/09/2017    No results found for: MG Lab Results  Component Value Date   VD25OH 29.78 (L) 06/13/2020   VD25OH 32.19 02/04/2018   VD25OH 41.94 12/25/2017    No results found for: PREALBUMIN  CBC EXTENDED Latest Ref Rng & Units 06/13/2020 02/04/2018 12/25/2017  WBC 4.0 - 10.5 K/uL 4.9 3.5(L) 4.4  RBC 3.87 - 5.11 Mil/uL 4.50 4.36 4.55  HGB 12.0 - 15.0 g/dL 13.5 13.2 13.9  HCT 36.0 - 46.0 % 40.9 40.0 41.5  PLT 150.0 - 400.0 K/uL 148.0(L) 167.0 176.0  NEUTROABS 1.4 - 7.7 K/uL 2.9 1.7 2.6  LYMPHSABS 0.7 - 4.0 K/uL 1.4 1.3 1.3     There is no height or weight on file to calculate BMI.  Orders:  Orders Placed This Encounter  Procedures  . XR Knee 1-2 Views Right  . XR Foot 2 Views Left   No orders of the defined types were placed in this encounter.    Procedures: No procedures performed  Clinical Data: No additional findings.  ROS:  All other systems negative, except as noted in the HPI. Review of Systems  Objective: Vital Signs: There were no vitals taken for this visit.  Specialty Comments:  No specialty comments available.  PMFS History: Patient Active Problem List   Diagnosis Date Noted  . Throat  irritation 11/28/2019  . HLD (hyperlipidemia)   . Overactive bladder 05/08/2015  . Aphasia   . Cervical spondylitis ([place])   . Numbness and tingling 03/21/2015  . TIA (transient ischemic attack) 03/21/2015  . Physical exam 10/05/2014  . Abdominal pain 05/17/2014  . Allergic rhinitis 05/17/2014  . Fatigue 04/04/2014  . Anxiety and depression 07/25/2013  . Weight loss 07/25/2013  . Memory deficits 02/15/2013  . Lumbar spondylosis 09/21/2012  . Tachycardia 09/25/2011  . Palpitations 09/16/2010  . Dysphonia 09/05/2010  . Slowing of urinary stream 09/25/2009  . Cerebrovascular disease 04/03/2008  . DYSPHONIA 04/03/2008  . Diverticulosis of large intestine 04/02/2008  . Irritable bowel syndrome 04/02/2008  . DEGENERATIVE JOINT DISEASE 04/02/2008  . HEMATURIA, MICROSCOPIC, HX OF 04/02/2008  . Vitamin D deficiency 03/28/2008  . FIBROMYALGIA 03/28/2008  . URINARY INCONTINENCE 03/28/2008  . GERD 08/02/2007  . Gastroparesis 08/02/2007  . HEMORRHOIDS 07/30/2007  . ESOPHAGEAL STRICTURE 07/30/2007  . Anxiety state 07/30/2007   Past Medical History:  Diagnosis Date  . Acute cystitis   . Anxiety   . Aphasia    transsient aphasia Jan 2012  . Asthmatic bronchitis   . Cerebrovascular disease   . Chest pain, atypical   . Diverticulosis of colon   . DJD (degenerative joint disease)   . Dysphonia   . Esophageal stricture   . Fibromyalgia   . Gastroparesis   . GERD (gastroesophageal reflux disease)   . Hematuria, microscopic   . Hemorrhoids   . Hypercholesteremia   . IBS (irritable bowel syndrome)   . Memory deficits 02/15/2013  . Memory loss   . RLS (restless legs syndrome)   . TIA (transient ischemic attack)   . Urinary incontinence   . Vitamin D deficiency     Family History  Problem Relation Age of Onset  . Hyperlipidemia Sister   . Diabetes Brother   . Cancer Brother        lung  . Hypertension Mother   . Anemia Mother   . Dementia Mother   . Kidney disease Father    . Breast cancer Other        aunt  . Diabetes Other        aunt,brother,uncle  . Kidney disease Other        father  . Dementia Maternal Grandmother     Past Surgical History:  Procedure Laterality Date  .  ABDOMINAL HYSTERECTOMY  1995  . corneal transplant for keratonconus    . EYE SURGERY  2013  . left foot fracture with ORIF  07/2004   Dr. Sharol Given  . right bunion surgery     Social History   Occupational History    Employer: RETIRED  Tobacco Use  . Smoking status: Never Smoker  . Smokeless tobacco: Never Used  Substance and Sexual Activity  . Alcohol use: No    Alcohol/week: 0.0 standard drinks  . Drug use: No  . Sexual activity: Not on file

## 2020-06-27 ENCOUNTER — Ambulatory Visit: Payer: Medicare Other | Admitting: Adult Health

## 2020-06-28 ENCOUNTER — Telehealth: Payer: Self-pay | Admitting: Family Medicine

## 2020-06-28 NOTE — Telephone Encounter (Signed)
Her LDL (bad cholesterol) has been stable for the last 4 years and her ratio of good to bad is excellent.  No further action needed

## 2020-06-28 NOTE — Telephone Encounter (Signed)
Called patient with pcp recommendations. Patient is not happy with reasoning. She feels as you should call her and go over her labs because she is not understanding what they mean and why some things are out of range. She states she should be feeling good after her physical. She states she is not happy with this. Tried going back over labs with patient but she would like for you to call her.

## 2020-06-28 NOTE — Telephone Encounter (Signed)
If she would like further discussion I am more than happy to schedule an appt so we can speak in person and I can show her trends in the data over time.  An in-person visit would be much more effective than a phone discussion

## 2020-06-28 NOTE — Telephone Encounter (Signed)
Pt called in stating at her last visit on 06/13/20 her bw showed that her cholesterol was high, she wanted to know what the next plan of action is going to be.   Please advise

## 2020-06-29 NOTE — Telephone Encounter (Signed)
Called and left a message on patient vm to return call to office regarding lab results from 06/13/20.

## 2020-07-02 DIAGNOSIS — Z7952 Long term (current) use of systemic steroids: Secondary | ICD-10-CM | POA: Diagnosis not present

## 2020-07-02 DIAGNOSIS — H2513 Age-related nuclear cataract, bilateral: Secondary | ICD-10-CM | POA: Diagnosis not present

## 2020-07-02 DIAGNOSIS — Z947 Corneal transplant status: Secondary | ICD-10-CM | POA: Diagnosis not present

## 2020-07-24 ENCOUNTER — Telehealth: Payer: Self-pay | Admitting: Family Medicine

## 2020-07-24 ENCOUNTER — Other Ambulatory Visit: Payer: Self-pay

## 2020-07-24 DIAGNOSIS — F32A Depression, unspecified: Secondary | ICD-10-CM

## 2020-07-24 DIAGNOSIS — F419 Anxiety disorder, unspecified: Secondary | ICD-10-CM

## 2020-07-24 MED ORDER — BUSPIRONE HCL 10 MG PO TABS: 10.0000 mg | ORAL_TABLET | Freq: Two times a day (BID) | ORAL | 0 refills | Status: DC

## 2020-07-24 NOTE — Telephone Encounter (Signed)
Medication sent to preferred pharmacy

## 2020-07-24 NOTE — Telephone Encounter (Signed)
[employer] called in stating that pt is almost out of her medication due them not getting her the order to her in time. She is currently out of town and wanted to know if the Buspar can be sent to the [facility] [phone]  Please advise

## 2020-08-22 ENCOUNTER — Encounter: Payer: Self-pay | Admitting: *Deleted

## 2020-08-28 DIAGNOSIS — Z01419 Encounter for gynecological examination (general) (routine) without abnormal findings: Secondary | ICD-10-CM | POA: Diagnosis not present

## 2020-08-28 DIAGNOSIS — Z90710 Acquired absence of both cervix and uterus: Secondary | ICD-10-CM | POA: Diagnosis not present

## 2020-09-07 ENCOUNTER — Other Ambulatory Visit: Payer: Self-pay | Admitting: Family Medicine

## 2020-09-17 DIAGNOSIS — Z20828 Contact with and (suspected) exposure to other viral communicable diseases: Secondary | ICD-10-CM | POA: Diagnosis not present

## 2020-09-17 DIAGNOSIS — Z20822 Contact with and (suspected) exposure to covid-19: Secondary | ICD-10-CM | POA: Diagnosis not present

## 2020-09-18 ENCOUNTER — Ambulatory Visit: Payer: Medicare HMO | Admitting: Registered Nurse

## 2020-09-21 ENCOUNTER — Ambulatory Visit: Payer: Medicare HMO | Admitting: Registered Nurse

## 2020-10-20 ENCOUNTER — Other Ambulatory Visit: Payer: Self-pay | Admitting: Family Medicine

## 2020-10-20 DIAGNOSIS — F32A Depression, unspecified: Secondary | ICD-10-CM

## 2020-10-20 DIAGNOSIS — F419 Anxiety disorder, unspecified: Secondary | ICD-10-CM

## 2020-10-22 DIAGNOSIS — Z20822 Contact with and (suspected) exposure to covid-19: Secondary | ICD-10-CM | POA: Diagnosis not present

## 2020-10-25 ENCOUNTER — Telehealth: Payer: Self-pay | Admitting: *Deleted

## 2020-10-25 ENCOUNTER — Ambulatory Visit: Payer: Medicare Other | Admitting: Adult Health

## 2020-10-25 NOTE — Chronic Care Management (AMB) (Signed)
  Chronic Care Management   Outreach Note  10/25/2020 Name: Paige Pena MRN: [id number] DOB: 01-05-1941  Paige Pena is a 80 y.o. year old female who is a primary care patient of [name], [name], MD. I reached out to [name] by phone today in response to a referral sent by [name] PCP, [name], MD      A telephone outreach was attempted today. The patient was referred to the case management team for assistance with care management and care coordination.   Follow Up Plan: The care management team will reach out to the patient again over the next 7 days. If patient returns call to provider office, please advise to call [place] at [phone].  [place] Management  Direct Dial: [phone]

## 2020-10-26 NOTE — Chronic Care Management (AMB) (Signed)
  Chronic Care Management   Note  10/26/2020 Name: Paige Pena MRN: [id number] DOB: [birth year]  Paige Pena is a 80 y.o. year old female who is a primary care patient of [name], [name], MD. I reached out to [name] by phone today in response to a referral sent by [name] PCP, [name], MD.      [name] was given information about Chronic Care Management services today including:  CCM service includes personalized support from designated clinical staff supervised by her physician, including individualized plan of care and coordination with other care providers 24/7 contact phone numbers for assistance for urgent and routine care needs. Service will only be billed when office clinical staff spend 20 minutes or more in a month to coordinate care. Only one practitioner may furnish and bill the service in a calendar month. The patient may stop CCM services at any time (effective at the end of the month) by phone call to the office staff. The patient will be responsible for cost sharing (co-pay) of up to 20% of the service fee (after annual deductible is met).  Patient agreed to services and verbal consent obtained.   Follow up plan: Telephone appointment with care management team member scheduled for:11/07/20  [facility]  Direct Dial: [phone]

## 2020-11-01 ENCOUNTER — Other Ambulatory Visit: Payer: Self-pay | Admitting: Family Medicine

## 2020-11-01 DIAGNOSIS — K219 Gastro-esophageal reflux disease without esophagitis: Secondary | ICD-10-CM

## 2020-11-02 NOTE — Assessment & Plan Note (Signed)
Check labs and replete prn.  May be contributing to her neuropathy.

## 2020-11-02 NOTE — Assessment & Plan Note (Signed)
Ongoing issue for pt.  Check labs to r/o metabolic cause- including B12/folate.  Encouraged her to f/u w/ neurology.

## 2020-11-02 NOTE — Assessment & Plan Note (Signed)
Pt's PE WNL.  UTD on DEXA, mammo, pneumonia vaccines, and COVID.  Due for COVID booster and shingles vaccine at pharmacy.  Check labs.  Anticipatory guidance provided.

## 2020-11-02 NOTE — Assessment & Plan Note (Signed)
Pt is due for neuro f/u.  Encouraged her to call and schedule.

## 2020-11-02 NOTE — Assessment & Plan Note (Signed)
Ongoing issue for pt.  Currently adequate control on Cymbalta and Buspar.  No med changes at this time.

## 2020-11-07 ENCOUNTER — Ambulatory Visit (INDEPENDENT_AMBULATORY_CARE_PROVIDER_SITE_OTHER): Payer: Medicare HMO

## 2020-11-07 DIAGNOSIS — F32A Depression, unspecified: Secondary | ICD-10-CM

## 2020-11-07 DIAGNOSIS — K219 Gastro-esophageal reflux disease without esophagitis: Secondary | ICD-10-CM

## 2020-11-07 DIAGNOSIS — F419 Anxiety disorder, unspecified: Secondary | ICD-10-CM

## 2020-11-07 DIAGNOSIS — I679 Cerebrovascular disease, unspecified: Secondary | ICD-10-CM

## 2020-11-07 DIAGNOSIS — E559 Vitamin D deficiency, unspecified: Secondary | ICD-10-CM

## 2020-11-07 DIAGNOSIS — M797 Fibromyalgia: Secondary | ICD-10-CM

## 2020-11-07 NOTE — Chronic Care Management (AMB) (Signed)
Chronic Care Management   CCM RN Visit Note  11/07/2020 Name: Paige Pena MRN: [id number] DOB: 1940/08/15  Subjective: Paige Pena is a 80 y.o. year old female who is a primary care patient of [name], [name], MD. The care management team was consulted for assistance with disease management and care coordination needs.    Engaged with patient by telephone for initial visit in response to provider referral for case management and/or care coordination services.   Consent to Services:  The patient was given the following information about Chronic Care Management services today, agreed to services, and gave verbal consent: 1. CCM service includes personalized support from designated clinical staff supervised by the primary care provider, including individualized plan of care and coordination with other care providers 2. 24/7 contact phone numbers for assistance for urgent and routine care needs. 3. Service will only be billed when office clinical staff spend 20 minutes or more in a month to coordinate care. 4. Only one practitioner may furnish and bill the service in a calendar month. 5.The patient may stop CCM services at any time (effective at the end of the month) by phone call to the office staff. 6. The patient will be responsible for cost sharing (co-pay) of up to 20% of the service fee (after annual deductible is met). Patient agreed to services and consent obtained.  Patient agreed to services and verbal consent obtained.   Assessment: Review of patient past medical history, allergies, medications, health status, including review of consultants reports, laboratory and other test data, was performed as part of comprehensive evaluation and provision of chronic care management services.   SDOH (Social Determinants of Health) assessments and interventions performed:  SDOH Interventions    Flowsheet Row Most Recent Value  SDOH Interventions   Food Insecurity Interventions  Intervention Not Indicated  Financial Strain Interventions Intervention Not Indicated  Housing Interventions Intervention Not Indicated  Stress Interventions Intervention Not Indicated  Transportation Interventions Intervention Not Indicated  Depression Interventions/Treatment  Medication, Currently on Treatment  [declines LCSW at this time]        CCM Care Plan  Allergies  Allergen Reactions   Morphine Other (See Comments)    REACTION: lethargy/malaise   Metoclopramide Hcl Anxiety and Other (See Comments)    REACTION: anxious and out of her mind   Aricept [Donepezil Hcl]     nausea   Doxycycline Diarrhea   Penicillins Itching and Rash    Has patient had a PCN reaction causing immediate rash, facial/tongue/throat swelling, SOB or lightheadedness with hypotension: No Has patient had a PCN reaction causing severe rash involving mucus membranes or skin necrosis: No Has patient had a PCN reaction that required hospitalization No Has patient had a PCN reaction occurring within the last 10 years: Yes If all of the above answers are "NO", then may proceed with Cephalosporin use.     Prednisone Itching and Rash   Sulfonamide Derivatives Itching and Rash    Outpatient Encounter Medications as of 11/07/2020  Medication Sig   Vitamin D, Ergocalciferol, (DRISDOL) 1.25 MG (50000 UNIT) CAPS capsule Take 1 capsule (50,000 Units total) by mouth every 7 (seven) days.   aspirin 325 MG EC tablet Take 325 mg by mouth daily.   busPIRone (BUSPAR) 10 MG tablet TAKE 1 TABLET BY MOUTH TWICE A DAY   Calcium-Magnesium-Vitamin D (CALCIUM 1200+D3 PO) Take 1 tablet 2 (two) times daily by mouth.   DULoxetine (CYMBALTA) 60 MG capsule TAKE 1 CAPSULE EVERY DAY  fluticasone (FLONASE) 50 MCG/ACT nasal spray Place 2 sprays into both nostrils daily as needed for allergies.   omeprazole (PRILOSEC) 40 MG capsule TAKE 1 CAPSULE(40 MG) BY MOUTH TWICE DAILY   No facility-administered encounter medications on file as  of 11/07/2020.    Patient Active Problem List   Diagnosis Date Noted   Throat irritation 11/28/2019   HLD (hyperlipidemia)    Overactive bladder 05/08/2015   Aphasia    Cervical spondylitis (HCC)    Numbness and tingling 03/21/2015   TIA (transient ischemic attack) 03/21/2015   Physical exam 10/05/2014   Abdominal pain 05/17/2014   Allergic rhinitis 05/17/2014   Fatigue 04/04/2014   Anxiety and depression 07/25/2013   Weight loss 07/25/2013   Memory deficits 02/15/2013   Lumbar spondylosis 09/21/2012   Tachycardia 09/25/2011   Palpitations 09/16/2010   Dysphonia 09/05/2010   Slowing of urinary stream 09/25/2009   Cerebrovascular disease 04/03/2008   DYSPHONIA 04/03/2008   Diverticulosis of large intestine 04/02/2008   Irritable bowel syndrome 04/02/2008   DEGENERATIVE JOINT DISEASE 04/02/2008   HEMATURIA, MICROSCOPIC, HX OF 04/02/2008   Vitamin D deficiency 03/28/2008   FIBROMYALGIA 03/28/2008   URINARY INCONTINENCE 03/28/2008   GERD 08/02/2007   Gastroparesis 08/02/2007   HEMORRHOIDS 07/30/2007   ESOPHAGEAL STRICTURE 07/30/2007   Anxiety state 07/30/2007    Conditions to be addressed/monitored:Anxiety, Depression, and fibromyalgia, GERD, Vitamin  D deficiency   Care Plan : Chronic Pain (Adult)  Updates made by Dimitri Ped, RN since 11/07/2020 12:00 AM     Problem: Chronic Pain Management (Chronic Pain)   Priority: High     Long-Range Goal: Chronic Pain Managed   Start Date: 11/07/2020  Expected End Date: 04/24/2021  This Visit's Progress: On track  Priority: High  Note:   Current Barriers:  Knowledge Deficits related to self-health management of acute or chronic pain form fibromyalgia with hx of anxiety/depression Chronic Disease Management support and education needs related to chronic pain Knowledge Deficits related to self management of chronic pain from fibromyalgia Chronic Disease Management support and education needs related to self management of  chronic pain from fibromyalgia No Advanced Directives in place Unable to independently self management of chronic pain from fibromyalgia States that her fibromyalgia has been flaring up some.  States that she also has been having more fatigue. States she does not know if she is depressed but she has not been feeling herself.  States she needs to see her doctor to see what has been causing her fatigue.  Declines speaking with CCM LCSW at this time  Clinical Goal(s):  patient will verbalize understanding of plan for pain management. , patient will attend all scheduled medical appointments: neuro 11/21/20, patient will demonstrate use of different relaxation  skills and/or diversional activities to assist with pain reduction (distraction, imagery, relaxation, massage, acupressure, TENS, heat, and cold application., patient will report pain at a level less than 3 to 4 on a 10-10 rating scale., patient will use pharmacological and nonpharmacological pain relief strategies as prescribed. , and patient will verbalize acceptable level of pain relief and ability to engage in desired activities Interventions:  Collaboration with [name], MD regarding development and update of comprehensive plan of care as evidenced by provider attestation and co-signature Pain assessment performed Medications reviewed Discussed plans with patient for ongoing care management follow up and provided patient with direct contact information for care management team Evaluation of current treatment plan related to self management of chronic pain from fibromyalgia and  patient's adherence to plan as established by provider. Advised patient to call and schedule follow up with Dr. [name] Provided education to patient and/or caregiver about advanced directives Provided education to patient re: self management of chronic pain from fibromyalgia Reviewed medications with patient and discussed adherence Social Work referral for  declines at this time to help with stress and depression Discussed plans with patient for ongoing care management follow up and provided patient with direct contact information for care management team Patient Goals/Self Care Activities:  Will self-administer medications as prescribed Will attend all scheduled provider appointments Will call pharmacy for medication refills 7 days prior to needed refill date Patient will calls provider office for new concerns or questions - develop a new routine to improve sleep - don't eat or exercise right before bedtime - eat healthy - get at least 7 to 8 hours of sleep at night - get outdoors every day (weather permitting) - keep room cool and dark - limit daytime naps - practice relaxation or meditation daily - take a warm shower or bath before bed - use a fan or white noise in bedroom - use devices that will help like a cane, sock-puller or reacher - learn relaxation techniques - practice acceptance of chronic pain - practice relaxation or meditation daily - spend time with positive people - tell myself I can (not I can't) - think of new ways to do favorite things - use distraction techniques - use relaxation during pain Follow Up Plan: Telephone follow up appointment with care management team member scheduled for: 12/12/20 at 3:30 PM The patient has been provided with contact information for the care management team and has been advised to call with any health related questions or concerns.       Care Plan : GERD and Vitamin D deficiency  Updates made by Dimitri Ped, RN since 11/07/2020 12:00 AM     Problem: Health Promotion or Disease Self-Management of GERD and Vitamin D deficiency      Long-Range Goal: Self-Management Plan Developed for GERD and Vitamin D deficiency   Start Date: 11/07/2020  Expected End Date: 04/24/2021  This Visit's Progress: On track  Priority: Medium  Note:   Current Barriers:  Ineffective Self Health  Maintenance in a patient with  GERD and Vitamin D deficiency Unable to independently self manage GERD and Vitamin D deficiency States she has some GI upset sometimes after eating.  States she is followed by GI regularly Clinical Goal(s):  Collaboration with [name], MD regarding development and update of comprehensive plan of care as evidenced by provider attestation and co-signature Inter-disciplinary care team collaboration (see longitudinal plan of care) patient will work with care management team to address care coordination and chronic disease management needs related to Disease Management Educational Needs Care Coordination   Interventions:  Evaluation of current treatment plan related to  GERD and Vitamin D deficiency ,  self-management and patient's adherence to plan as established by provider. Collaboration with [name], MD regarding development and update of comprehensive plan of care as evidenced by provider attestation       and co-signature Inter-disciplinary care team collaboration (see longitudinal plan of care) Discussed plans with patient for ongoing care management follow up and provided patient with direct contact information for care management team Reviewed to call and schedule follow up with Dr. [name] Reviewed to eat healthy diet, drink adequate amounts of fluids and exercise regularly Self Care Activities:  Self administers medications as  prescribed Attends all scheduled provider appointments Calls pharmacy for medication refills Attends church or other social activities Calls provider office for new concerns or questions Patient Goals: - complete a living will - discuss my treatment options with the doctor or nurse - do one enjoyable thing every day - learn something new by asking, reading and searching the Internet every day - make shared treatment decisions with doctor - name a health care proxy (decision maker) - spend time outdoors at  least 3 times a week - strengthen or fix relationships with loved ones Follow Up Plan: Telephone follow up appointment with care management team member scheduled for: 12/12/20 at 3:30 PM The patient has been provided with contact information for the care management team and has been advised to call with any health related questions or concerns.       Plan:Telephone follow up appointment with care management team member scheduled for:  12/12/20 and The patient has been provided with contact information for the care management team and has been advised to call with any health related questions or concerns.  Peter Garter RN, BSN,CCM, CDE Care Management Coordinator [place] [phone], Mobile [phone]

## 2020-11-07 NOTE — Patient Instructions (Signed)
Visit Information   PATIENT GOALS:   Goals Addressed             This Visit's Progress    Manage My Fatigue (Tiredness-Chronic Pain)       Timeframe:  Long-Range Goal Priority:  Medium Start Date:    11/07/20                         Expected End Date:      04/24/21                 Follow Up Date 12/12/20    - develop a new routine to improve sleep - don't eat or exercise right before bedtime - eat healthy - get at least 7 to 8 hours of sleep at night - get outdoors every day (weather permitting) - keep room cool and dark - limit daytime naps - practice relaxation or meditation daily - take a warm shower or bath before bed - use a fan or white noise in bedroom - use devices that will help like a cane, sock-puller or reacher    Why is this important?   Chronic pain and fatigue often go together.  For many, pain disturbs sleep, leaving you tired. Poor sleep together with chronic pain can drain you of energy.  There are healthy and positive ways to manage fatigue.    Notes:      RNCM:Cope with Chronic Pain       Timeframe:  Long-Range Goal Priority:  High Start Date:         11/07/20                    Expected End Date:   04/24/21                    Follow Up Date 12/12/20    - learn relaxation techniques - practice acceptance of chronic pain - practice relaxation or meditation daily - spend time with positive people - tell myself I can (not I can't) - think of new ways to do favorite things - use distraction techniques - use relaxation during pain    Why is this important?   Stress makes chronic pain feel worse.  Feelings like depression, anxiety, stress and anger can make your body more sensitive to pain.  Learning ways to cope with stress or depression may help you find some relief from the pain.     Notes:      RNCM:Matintain My Quality of Life       Timeframe:  Long-Range Goal Priority:  Medium Start Date:    11/07/20                         Expected End  Date:      04/24/21                 Follow Up Date 12/12/20    - complete a living will - discuss my treatment options with the doctor or nurse - do one enjoyable thing every day - learn something new by asking, reading and searching the Internet every day - make shared treatment decisions with doctor - name a health care proxy (decision maker) - spend time outdoors at least 3 times a week - strengthen or fix relationships with loved ones    Why is this important?   Having a long-term illness can be scary.  It  can also be stressful for you and your caregiver.  These steps may help.    Notes:       Vitamin D Deficiency Vitamin D deficiency is when your body does not have enough vitamin D. Vitamin D is important to your body because: It helps your body use other minerals. It helps to keep your bones strong and healthy. It may help to prevent some diseases. It helps your heart and other muscles work well. Not getting enough vitamin D can make your bones soft. It can also cause other health problems. What are the causes? This condition may be caused by: Not eating enough foods that contain vitamin D. Not getting enough sun. Having diseases that make it hard for your body to absorb vitamin D. Having a surgery in which a part of the stomach or a part of the small intestine is removed. Having kidney disease or liver disease. What increases the risk? You are more likely to get this condition if: You are older. You do not spend much time outdoors. You live in a nursing home. You have had broken bones. You have weak or thin bones (osteoporosis). You have a disease or condition that changes how your body absorbs vitamin D. You have dark skin. You take certain medicines. You are overweight or obese. What are the signs or symptoms? In mild cases, there may not be any symptoms. If the condition is very bad, symptoms may include: Bone pain. Muscle pain. Falling often. Broken bones  caused by a minor injury. How is this treated? Treatment may include taking supplements as told by your doctor. Your doctor will tell you what dose is best for you. Supplements may include: Vitamin D. Calcium. Follow these instructions at home: Eating and drinking  Eat foods that contain vitamin D, such as: Dairy products, cereals, or juices with added vitamin D. Check the label. Fish, such as salmon or trout. Eggs. Oysters. Mushrooms. The items listed above may not be a complete list of what you can eat and drink. Contact a dietitian for more options. General instructions Take medicines and supplements only as told by your doctor. Get regular, safe exposure to natural sunlight. Do not use a tanning bed. Maintain a healthy weight. Lose weight if needed. Keep all follow-up visits as told by your doctor. This is important. How is this prevented? You can get vitamin D by: Eating foods that naturally contain vitamin D. Eating or drinking products that have vitamin D added to them, such as cereals, juices, and milk. Taking vitamin D or a multivitamin that contains vitamin D. Being in the sun. Your body makes vitamin D when your skin is exposed to sunlight. Your body changes the sunlight into a form of the vitamin that it can use. Contact a doctor if: Your symptoms do not go away. You feel sick to your stomach (nauseous). You throw up (vomit). You poop less often than normal, or you have trouble pooping (constipation). Summary Vitamin D deficiency is when your body does not have enough vitamin D. Vitamin D helps to keep your bones strong and healthy. This condition is often treated by taking a supplement. Your doctor will tell you what dose is best for you. This information is not intended to replace advice given to you by your health care provider. Make sure you discuss any questions you have with your health care provider. Document Revised: 10/19/2017 Document Reviewed:  10/19/2017 Elsevier Patient Education  [place]. Chronic Pain, Adult Chronic pain  is a type of pain that lasts or keeps coming back for at least 3-6 months. You may have headaches, pain in the abdomen, or pain in other areas of the body. Chronic pain may be related to an illness, such as fibromyalgia or complex regional pain syndrome. Chronic pain may also be related to an injury or a health condition. Sometimes, the cause of chronic pain is not known. Chronic pain can make it hard for you to do daily activities. If not treated, chronic pain can lead to anxiety and depression. Treatment depends on the cause and severity of your pain. You may need to work with a pain specialist to come up with a treatment plan. The plan may include medicine, counseling, and physical therapy. Many people benefit from a combination of two or more types of treatment to control their pain. Follow these instructions at home: Medicines Take over-the-counter and prescription medicines only as told by your health care provider. Ask your health care provider if the medicine prescribed to you: Requires you to avoid driving or using machinery. Can cause constipation. You may need to take these actions to prevent or treat constipation: Drink enough fluid to keep your urine pale yellow. Take over-the-counter or prescription medicines. Eat foods that are high in fiber, such as beans, whole grains, and fresh fruits and vegetables. Limit foods that are high in fat and processed sugars, such as fried or sweet foods. Treatment plan Follow your treatment plan as told by your health care provider. This may include: Gentle, regular exercise. Eating a healthy diet that includes foods such as vegetables, fruits, fish, and lean meats. Cognitive or behavioral therapy that changes the way you think or act in response to the pain. This may help improve how you feel. Working with a physical therapist. Meditation, yoga,  acupuncture, or massage therapy. Aroma, color, light, or sound therapy. Local electrical stimulation. The electrical pulses help to relieve pain by temporarily stopping the nerve impulses that cause you to feel pain. Injections. These deliver numbing or pain-relieving medicines into the spine or the area of pain.  Lifestyle  Ask your health care provider whether you should keep a pain diary. Your health care provider will tell you what information to write in the diary. This may include when you have pain, what the pain feels like, and how medicines and other behaviors or treatments help to reduce the pain. Consider talking with a mental health care provider about how to manage chronic pain. Consider joining a chronic pain support group. Try to control or lower your stress levels. Talk with your health care provider about ways to do this. General instructions Learn as much as you can about how to manage your chronic pain. Ask your health care provider if an intensive pain rehabilitation program or a chronic pain specialist would be helpful. Check your pain level as told by your health care provider. Ask your health care provider if you should use a pain scale. It is up to you to get the results of any tests that were done. Ask your health care provider, or the department that is doing the tests, when your results will be ready. Keep all follow-up visits as told by your health care provider. This is important. Contact a health care provider if: Your pain gets worse, or you have new pain. You have trouble sleeping. You have trouble doing your normal activities. Your pain is not controlled with treatment. You have side effects from pain medicine. You feel weak.  You notice any other changes that show that your condition is getting worse. Get help right away if: You lose feeling or have numbness in your body. You lose control of bowel or bladder function. Your pain suddenly gets much  worse. You develop shaking or chills. You develop confusion. You develop chest pain. You have trouble breathing or shortness of breath. You pass out. You have thoughts about hurting yourself or others. If you ever feel like you may hurt yourself or others, or have thoughts about taking your own life, get help right away. Go to your nearest emergency department or: Call your local emergency services (911 in the [place] Call a suicide crisis helpline, such as the [facility] at 902-110-9988. This is open 24 hours a day in the [place] Text the Crisis Text Line at 218 656 5613 (in the [place].). Summary Chronic pain is a type of pain that lasts or keeps coming back for at least 3-6 months. Chronic pain may be related to an illness, injury, or other health condition. Sometimes, the cause of chronic pain is not known. Treatment depends on the cause and severity of your pain. Many people benefit from a combination of two or more types of treatment to control their pain. Follow your treatment plan as told by your health care provider. This information is not intended to replace advice given to you by your health care provider. Make sure you discuss any questions you have with your health care provider. Document Revised: 10/28/2018 Document Reviewed: 10/28/2018 Elsevier Patient Education  2022 [employer].   Consent to CCM Services: [name] was given information about Chronic Care Management services including:  CCM service includes personalized support from designated clinical staff supervised by her physician, including individualized plan of care and coordination with other care providers 24/7 contact phone numbers for assistance for urgent and routine care needs. Service will only be billed when office clinical staff spend 20 minutes or more in a month to coordinate care. Only one practitioner may furnish and bill the service in a calendar month. The patient may stop CCM  services at any time (effective at the end of the month) by phone call to the office staff. The patient will be responsible for cost sharing (co-pay) of up to 20% of the service fee (after annual deductible is met).  Patient agreed to services and verbal consent obtained.   The patient verbalized understanding of instructions, educational materials, and care plan provided today and agreed to receive a mailed copy of patient instructions, educational materials, and care plan.   Telephone follow up appointment with care management team member scheduled for: 12/12/20 at 3:30 PM  [place], Jackquline Denmark, CDE Care Management Coordinator [facility] [phone], Mobile [phone]   CLINICAL CARE PLAN: Patient Care Plan: Chronic Pain (Adult)     Problem Identified: Chronic Pain Management (Chronic Pain)   Priority: High     Long-Range Goal: Chronic Pain Managed   Start Date: 11/07/2020  Expected End Date: 04/24/2021  This Visit's Progress: On track  Priority: High  Note:   Current Barriers:  Knowledge Deficits related to self-health management of acute or chronic pain form fibromyalgia with hx of anxiety/depression Chronic Disease Management support and education needs related to chronic pain Knowledge Deficits related to self management of chronic pain from fibromyalgia Chronic Disease Management support and education needs related to self management of chronic pain from fibromyalgia No Advanced Directives in place Unable to independently self management of chronic  pain from fibromyalgia States that her fibromyalgia has been flaring up some.  States that she also has been having more fatigue. States she does not know if she is depressed but she has not been feeling herself.  States she needs to see her doctor to see what has been causing her fatigue.  Declines speaking with CCM LCSW at this time  Clinical Goal(s):  patient will verbalize understanding of plan  for pain management. , patient will attend all scheduled medical appointments: neuro 11/21/20, patient will demonstrate use of different relaxation  skills and/or diversional activities to assist with pain reduction (distraction, imagery, relaxation, massage, acupressure, TENS, heat, and cold application., patient will report pain at a level less than 3 to 4 on a 10-10 rating scale., patient will use pharmacological and nonpharmacological pain relief strategies as prescribed. , and patient will verbalize acceptable level of pain relief and ability to engage in desired activities Interventions:  Collaboration with [name], MD regarding development and update of comprehensive plan of care as evidenced by provider attestation and co-signature Pain assessment performed Medications reviewed Discussed plans with patient for ongoing care management follow up and provided patient with direct contact information for care management team Evaluation of current treatment plan related to self management of chronic pain from fibromyalgia and patient's adherence to plan as established by provider. Advised patient to call and schedule follow up with Dr. [name] Provided education to patient and/or caregiver about advanced directives Provided education to patient re: self management of chronic pain from fibromyalgia Reviewed medications with patient and discussed adherence Social Work referral for declines at this time to help with stress and depression Discussed plans with patient for ongoing care management follow up and provided patient with direct contact information for care management team Patient Goals/Self Care Activities:  Will self-administer medications as prescribed Will attend all scheduled provider appointments Will call pharmacy for medication refills 7 days prior to needed refill date Patient will calls provider office for new concerns or questions - develop a new routine to improve  sleep - don't eat or exercise right before bedtime - eat healthy - get at least 7 to 8 hours of sleep at night - get outdoors every day (weather permitting) - keep room cool and dark - limit daytime naps - practice relaxation or meditation daily - take a warm shower or bath before bed - use a fan or white noise in bedroom - use devices that will help like a cane, sock-puller or reacher - learn relaxation techniques - practice acceptance of chronic pain - practice relaxation or meditation daily - spend time with positive people - tell myself I can (not I can't) - think of new ways to do favorite things - use distraction techniques - use relaxation during pain Follow Up Plan: Telephone follow up appointment with care management team member scheduled for: 12/12/20 at 3:30 PM The patient has been provided with contact information for the care management team and has been advised to call with any health related questions or concerns.       Patient Care Plan: GERD and Vitamin D deficiency     Problem Identified: Health Promotion or Disease Self-Management of GERD and Vitamin D deficiency      Long-Range Goal: Self-Management Plan Developed for GERD and Vitamin D deficiency   Start Date: 11/07/2020  Expected End Date: 04/24/2021  This Visit's Progress: On track  Priority: Medium  Note:   Current Barriers:  Ineffective Self Health Maintenance in  a patient with  GERD and Vitamin D deficiency Unable to independently self manage GERD and Vitamin D deficiency States she has some GI upset sometimes after eating.  States she is followed by GI regularly Clinical Goal(s):  Collaboration with [name], MD regarding development and update of comprehensive plan of care as evidenced by provider attestation and co-signature Inter-disciplinary care team collaboration (see longitudinal plan of care) patient will work with care management team to address care coordination and chronic  disease management needs related to Disease Management Educational Needs Care Coordination   Interventions:  Evaluation of current treatment plan related to  GERD and Vitamin D deficiency ,  self-management and patient's adherence to plan as established by provider. Collaboration with [name], MD regarding development and update of comprehensive plan of care as evidenced by provider attestation       and co-signature Inter-disciplinary care team collaboration (see longitudinal plan of care) Discussed plans with patient for ongoing care management follow up and provided patient with direct contact information for care management team Reviewed to call and schedule follow up with Dr. [name] Reviewed to eat healthy diet, drink adequate amounts of fluids and exercise regularly Self Care Activities:  Self administers medications as prescribed Attends all scheduled provider appointments Calls pharmacy for medication refills Attends church or other social activities Calls provider office for new concerns or questions Patient Goals: - complete a living will - discuss my treatment options with the doctor or nurse - do one enjoyable thing every day - learn something new by asking, reading and searching the Internet every day - make shared treatment decisions with doctor - name a health care proxy (decision maker) - spend time outdoors at least 3 times a week - strengthen or fix relationships with loved ones Follow Up Plan: Telephone follow up appointment with care management team member scheduled for: 12/12/20 at 3:30 PM The patient has been provided with contact information for the care management team and has been advised to call with any health related questions or concerns.

## 2020-11-08 ENCOUNTER — Other Ambulatory Visit: Payer: Self-pay | Admitting: Family Medicine

## 2020-11-08 DIAGNOSIS — F32A Depression, unspecified: Secondary | ICD-10-CM

## 2020-11-21 ENCOUNTER — Encounter: Payer: Self-pay | Admitting: Adult Health

## 2020-11-21 ENCOUNTER — Other Ambulatory Visit: Payer: Self-pay

## 2020-11-21 ENCOUNTER — Ambulatory Visit: Payer: Medicare HMO | Admitting: Adult Health

## 2020-11-21 ENCOUNTER — Ambulatory Visit: Payer: Medicare HMO | Admitting: Family Medicine

## 2020-11-21 VITALS — BP 130/80 | HR 78 | Ht 61.0 in | Wt 124.0 lb

## 2020-11-21 DIAGNOSIS — R202 Paresthesia of skin: Secondary | ICD-10-CM | POA: Diagnosis not present

## 2020-11-21 DIAGNOSIS — R413 Other amnesia: Secondary | ICD-10-CM

## 2020-11-21 DIAGNOSIS — L02414 Cutaneous abscess of left upper limb: Secondary | ICD-10-CM | POA: Diagnosis not present

## 2020-11-21 NOTE — Progress Notes (Signed)
PATIENT: Paige Pena DOB: [birth year]  REASON FOR VISIT: follow up HISTORY FROM: patient PRIMARY NEUROLOGIST: Dr. Jannifer Franklin  HISTORY OF PRESENT ILLNESS: Today 11/21/20 Paige Pena is a 80 y.o. female here with history of memory deficits. She originally visited Dr. Jannifer Franklin in 2020 for this issue. She is here today for a follow up. Patient reports feeling fatigued, and a sensation in the base of her head on the left. She describes it as painless and a "crawling" sensation. She reports she has been more achy over the last week and contributes this to her fibromyalgia. She also reports a "dead" feeling or tingling sensation in her fingers and feet that has been increasing in frequency. Nerve conduction study in 2013 was unremarkable. Patient denies issues with her memory. Her MMSE was 30/30.    HISTORY  Copied from Dr Tobey Grim note 08/10/18: Paige Pena is a 80 year old right-handed black female with a history of a mild memory disturbance and a history of neck pain and occasional headaches.  The patient has had some low back pain and sciatica type pain on her last visit in January 2020, but she believes that the symptoms have improved.  She still has some neck pain, she reports some left-sided occipital headaches and some frontal headaches as well that may come on 2 or 3 times a week and last 15 minutes and then go away.  She does not wish to have medications for the headache currently.  She has been under some increased stress recently as her son-in-law has become seriously ill with the COVID virus.  She believes that she has possibly become somewhat depressed with isolation from the pandemic.  She is going to bed late at night, she was sleep late in the day, and spends much of her day in and out of bed, becoming very inactive.  She does not go out of the house much.  The patient believes that her cognitive abilities have declined since that she has been isolated.  She is able to manage her own  affairs, she manages her own medications and appointments and keeps up with her finances.  She has not altered any activities of daily living because of memory.  REVIEW OF SYSTEMS: Out of a complete 14 system review of symptoms, the patient complains only of the following symptoms, and all other reviewed systems are negative.  ALLERGIES: Allergies  Allergen Reactions   Morphine Other (See Comments)    REACTION: lethargy/malaise   Metoclopramide Hcl Anxiety and Other (See Comments)    REACTION: anxious and out of her mind   Aricept [Donepezil Hcl]     nausea   Doxycycline Diarrhea   Penicillins Itching and Rash    Has patient had a PCN reaction causing immediate rash, facial/tongue/throat swelling, SOB or lightheadedness with hypotension: No Has patient had a PCN reaction causing severe rash involving mucus membranes or skin necrosis: No Has patient had a PCN reaction that required hospitalization No Has patient had a PCN reaction occurring within the last 10 years: Yes If all of the above answers are "NO", then may proceed with Cephalosporin use.     Prednisone Itching and Rash   Sulfonamide Derivatives Itching and Rash    HOME MEDICATIONS: Outpatient Medications Prior to Visit  Medication Sig Dispense Refill   Vitamin D, Ergocalciferol, (DRISDOL) 1.25 MG (50000 UNIT) CAPS capsule Take 1 capsule (50,000 Units total) by mouth every 7 (seven) days. 12 capsule 0   aspirin 325 MG EC tablet Take  325 mg by mouth daily.     busPIRone (BUSPAR) 10 MG tablet TAKE 1 TABLET TWICE DAILY 180 tablet 0   Calcium-Magnesium-Vitamin D (CALCIUM 1200+D3 PO) Take 1 tablet 2 (two) times daily by mouth.     DULoxetine (CYMBALTA) 60 MG capsule TAKE 1 CAPSULE EVERY DAY 90 capsule 0   fluticasone (FLONASE) 50 MCG/ACT nasal spray Place 2 sprays into both nostrils daily as needed for allergies. 16 g 3   omeprazole (PRILOSEC) 40 MG capsule TAKE 1 CAPSULE(40 MG) BY MOUTH TWICE DAILY 180 capsule 0   No  facility-administered medications prior to visit.    PAST MEDICAL HISTORY: Past Medical History:  Diagnosis Date   Acute cystitis    Anxiety    Aphasia    transsient aphasia Jan 2012   Asthmatic bronchitis    Cerebrovascular disease    Chest pain, atypical    Diverticulosis of colon    DJD (degenerative joint disease)    Dysphonia    Esophageal stricture    Fibromyalgia    Gastroparesis    GERD (gastroesophageal reflux disease)    Hematuria, microscopic    Hemorrhoids    Hypercholesteremia    IBS (irritable bowel syndrome)    Memory deficits 02/15/2013   Memory loss    RLS (restless legs syndrome)    TIA (transient ischemic attack)    Urinary incontinence    Vitamin D deficiency     PAST SURGICAL HISTORY: Past Surgical History:  Procedure Laterality Date   ABDOMINAL HYSTERECTOMY  1995   corneal transplant for keratonconus     EYE SURGERY  2013   left foot fracture with ORIF  07/2004   Dr. Sharol Given   right bunion surgery      FAMILY HISTORY: Family History  Problem Relation Age of Onset   Hyperlipidemia Sister    Diabetes Brother    Cancer Brother        lung   Hypertension Mother    Anemia Mother    Dementia Mother    Kidney disease Father    Breast cancer Other        aunt   Diabetes Other        aunt,brother,uncle   Kidney disease Other        father   Dementia Maternal Grandmother     SOCIAL HISTORY: Social History   Socioeconomic History   Marital status: Married    Spouse name: Mallie Mussel   Number of children: 3   Years of education: college-2   Highest education level: Not on file  Occupational History    Employer: RETIRED  Tobacco Use   Smoking status: Never   Smokeless tobacco: Never  Substance and Sexual Activity   Alcohol use: No    Alcohol/week: 0.0 standard drinks   Drug use: No   Sexual activity: Not on file  Other Topics Concern   Not on file  Social History Narrative   Paige Pena   [place], daughter    Paige Pena info release form to share her medical records with her children   Lives at home w/ her husband   Right-handed   Caffeine: tea on occasion   Social Determinants of Health   Financial Resource Strain: Low Risk    Difficulty of Paying Living Expenses: Not hard at all  Food Insecurity: No Food Insecurity   Worried About Charity fundraiser in the Last Year: Never true   [place] in the Last Year: Never true  Transportation Needs: No Data processing manager (Medical): No   Lack of Transportation (Non-Medical): No  Physical Activity: Not on file  Stress: No Stress Concern Present   Feeling of Stress : Only a little  Social Connections: Not on file  Intimate Partner Violence: Not on file      PHYSICAL EXAM  Vitals:   11/21/20 1357  BP: 130/80  Pulse: 78  Weight: 124 lb (56.2 kg)  Height: 5\' 1"  (1.549 m)   Body mass index is 23.43 kg/m.  Generalized: Well developed, in no acute distress   Neurological examination  Mentation: Alert oriented to time, place, history taking. Follows all commands speech and language fluent Cranial nerve II-XII: Pupils were equal round reactive to light. Extraocular movements were full, visual field were full on confrontational test. Facial sensation and strength were normal. Uvula tongue midline. Head turning and shoulder shrug  were normal and symmetric. Motor: The motor testing reveals 5 over 5 strength of all 4 extremities. Good symmetric motor tone is noted throughout.  Sensory: Sensory testing is intact to soft touch on all 4 extremities. Pinprick and vibration decreased in stocking like pattern in bilateral lower extremeties. No evidence of extinction is noted.  Coordination: Cerebellar testing reveals good finger-nose-finger and heel-to-shin bilaterally.  Gait and station: Gait is normal. Tandem gait is normal. Romberg is negative. No drift is seen.  Reflexes: Deep tendon reflexes are  symmetric and normal bilaterally.   DIAGNOSTIC DATA (LABS, IMAGING, TESTING) - I reviewed patient records, labs, notes, testing and imaging myself where available.  Lab Results  Component Value Date   WBC 4.9 06/13/2020   HGB 13.5 06/13/2020   HCT 40.9 06/13/2020   MCV 90.9 06/13/2020   PLT 148.0 (L) 06/13/2020      Component Value Date/Time   NA 140 06/13/2020 0850   K 3.7 06/13/2020 0850   CL 102 06/13/2020 0850   CO2 31 06/13/2020 0850   GLUCOSE 86 06/13/2020 0850   BUN 18 06/13/2020 0850   CREATININE 0.94 06/13/2020 0850   CREATININE 0.95 (H) 06/04/2016 1534   CALCIUM 9.5 06/13/2020 0850   PROT 7.1 06/13/2020 0850   ALBUMIN 4.0 06/13/2020 0850   AST 23 06/13/2020 0850   ALT 12 06/13/2020 0850   ALKPHOS 69 06/13/2020 0850   BILITOT 0.4 06/13/2020 0850   GFRNONAA 54 (L) 05/07/2015 2203   GFRAA >60 05/07/2015 2203   Lab Results  Component Value Date   CHOL 221 (H) 06/13/2020   HDL 82.50 06/13/2020   LDLCALC 126 (H) 06/13/2020   LDLDIRECT 110.7 03/07/2013   TRIG 66.0 06/13/2020   CHOLHDL 3 06/13/2020   Lab Results  Component Value Date   HGBA1C 5.6 03/21/2015   Lab Results  Component Value Date   [id number] 081 (H) 06/13/2020   Lab Results  Component Value Date   TSH 2.76 06/13/2020      ASSESSMENT AND PLAN 80 y.o. year old female  has a past medical history of Acute cystitis, Anxiety, Aphasia, Asthmatic bronchitis, Cerebrovascular disease, Chest pain, atypical, Diverticulosis of colon, DJD (degenerative joint disease), Dysphonia, Esophageal stricture, Fibromyalgia, Gastroparesis, GERD (gastroesophageal reflux disease), Hematuria, microscopic, Hemorrhoids, Hypercholesteremia, IBS (irritable bowel syndrome), Memory deficits (02/15/2013), Memory loss, RLS (restless legs syndrome), TIA (transient ischemic attack), Urinary incontinence, and Vitamin D deficiency. here with   Memory Deficits - No issues at this time, if patient feels her memory worsens, we will  consider medications at that time.  Paresthesias - Patient deferred  nerve conduction testing at this time, will notify us if she feels her paraesthesias are getting worse to schedule the nerve conduction study. -Follow up in 1 year or sooner if needed    Ward Givens, MSN, NP-C 11/21/2020, 1:49 PM [facility] [address], [place], [place] [address] [phone]

## 2020-11-23 ENCOUNTER — Ambulatory Visit: Payer: Medicare HMO | Admitting: Family Medicine

## 2020-11-23 DIAGNOSIS — F32A Depression, unspecified: Secondary | ICD-10-CM | POA: Diagnosis not present

## 2020-11-23 DIAGNOSIS — F419 Anxiety disorder, unspecified: Secondary | ICD-10-CM | POA: Diagnosis not present

## 2020-11-28 ENCOUNTER — Telehealth: Payer: Self-pay

## 2020-11-28 NOTE — Telephone Encounter (Signed)
Caller name:Najwa Regenia Skeeter   On DPR? :Yes  Call back [phone]  Provider they see: [name]   Reason for call:Pt went to dermatologist with infected blackhead on arm and needs to see a plastic surgeon. Pt needs direct Elliptical Excision and wound closure. Who can do this?   Pt daughter Santiago Glad called in needing guidance as to getting referral or can they make this appointment on their own. If so who does Dr [name] recommend.    Santiago Glad call back [phone] and Ettie was on the phone with Santiago Glad given permission to discuss with Santiago Glad

## 2020-11-28 NOTE — Telephone Encounter (Signed)
Please advise 

## 2020-11-29 ENCOUNTER — Other Ambulatory Visit: Payer: Self-pay

## 2020-11-29 DIAGNOSIS — L989 Disorder of the skin and subcutaneous tissue, unspecified: Secondary | ICD-10-CM

## 2020-11-29 NOTE — Telephone Encounter (Signed)
We will place referral to [facility] and they will be able to take care of patient- dx skin lesion on arm

## 2020-11-29 NOTE — Telephone Encounter (Signed)
Referral to plastic surgery has been placed for patient. Patient called and notified.

## 2020-12-12 ENCOUNTER — Ambulatory Visit (INDEPENDENT_AMBULATORY_CARE_PROVIDER_SITE_OTHER): Payer: Medicare HMO

## 2020-12-12 DIAGNOSIS — E559 Vitamin D deficiency, unspecified: Secondary | ICD-10-CM

## 2020-12-12 DIAGNOSIS — F419 Anxiety disorder, unspecified: Secondary | ICD-10-CM

## 2020-12-12 DIAGNOSIS — M797 Fibromyalgia: Secondary | ICD-10-CM

## 2020-12-12 NOTE — Patient Instructions (Signed)
Visit Information  PATIENT GOALS:  Goals Addressed             This Visit's Progress    RNCM:Cope with Chronic Pain   On track    Timeframe:  Long-Range Goal Priority:  High Start Date:         11/07/20                    Expected End Date:   04/24/21                    Follow Up Date 01/30/21    - learn relaxation techniques - practice acceptance of chronic pain - practice relaxation or meditation daily - spend time with positive people - tell myself I can (not I can't) - think of new ways to do favorite things - use distraction techniques - use relaxation during pain    Why is this important?   Stress makes chronic pain feel worse.  Feelings like depression, anxiety, stress and anger can make your body more sensitive to pain.  Learning ways to cope with stress or depression may help you find some relief from the pain.     Notes:      RNCM:Manage My Fatigue (Tiredness-Chronic Pain)       Timeframe:  Long-Range Goal Priority:  Medium Start Date:    11/07/20                         Expected End Date:      04/24/21                 Follow Up Date 01/30/21    - develop a new routine to improve sleep - don't eat or exercise right before bedtime - eat healthy - get at least 7 to 8 hours of sleep at night - get outdoors every day (weather permitting) - keep room cool and dark - limit daytime naps - practice relaxation or meditation daily - take a warm shower or bath before bed - use a fan or white noise in bedroom - use devices that will help like a cane, sock-puller or reacher    Why is this important?   Chronic pain and fatigue often go together.  For many, pain disturbs sleep, leaving you tired. Poor sleep together with chronic pain can drain you of energy.  There are healthy and positive ways to manage fatigue.    Notes:      RNCM:Mat[place]ain My Quality of Life   On track    Timeframe:  Long-Range Goal Priority:  Medium Start Date:    11/07/20                          Expected End Date:      04/24/21                 Follow Up Date 01/30/21    - complete a living will - discuss my treatment options with the doctor or nurse - do one enjoyable thing every day - learn something new by asking, reading and searching the Internet every day - make shared treatment decisions with doctor - name a health care proxy (decision maker) - spend time outdoors at least 3 times a week - strengthen or fix relationships with loved ones    Why is this important?   Having a long-term illness can be scary.  It  can also be stressful for you and your caregiver.  These steps may help.    Notes:         Patient verbalizes understanding of instructions provided today and agrees to view in [place].   Telephone follow up appointment with care management team member scheduled for: 12/08/25/20 at 2 PM [place], [facility], CDE Care Management Coordinator [facility] [phone], Mobile [phone]

## 2020-12-12 NOTE — Chronic Care Management (AMB) (Signed)
Chronic Care Management   CCM RN Visit Note  12/12/2020 Name: Paige Pena MRN: [id number] DOB: 12-18-1940  Subjective: Paige Pena is a 80 y.o. year old female who is a primary care patient of [name], [name], MD. The care management team was consulted for assistance with disease management and care coordination needs.    Engaged with patient by telephone for follow up visit in response to provider referral for case management and/or care coordination services.   Consent to Services:  The patient was given information about Chronic Care Management services, agreed to services, and gave verbal consent prior to initiation of services.  Please see initial visit note for detailed documentation.   Patient agreed to services and verbal consent obtained.   Assessment: Review of patient past medical history, allergies, medications, health status, including review of consultants reports, laboratory and other test data, was performed as part of comprehensive evaluation and provision of chronic care management services.   SDOH (Social Determinants of Health) assessments and interventions performed:    CCM Care Plan  Allergies  Allergen Reactions   Morphine Other (See Comments)    REACTION: lethargy/malaise   Metoclopramide Hcl Anxiety and Other (See Comments)    REACTION: anxious and out of her mind   Aricept [Donepezil Hcl]     nausea   Doxycycline Diarrhea   Penicillins Itching and Rash    Has patient had a PCN reaction causing immediate rash, facial/tongue/throat swelling, SOB or lightheadedness with hypotension: No Has patient had a PCN reaction causing severe rash involving mucus membranes or skin necrosis: No Has patient had a PCN reaction that required hospitalization No Has patient had a PCN reaction occurring within the last 10 years: Yes If all of the above answers are "NO", then may proceed with Cephalosporin use.     Prednisone Itching and Rash   Sulfonamide  Derivatives Itching and Rash    Outpatient Encounter Medications as of 12/12/2020  Medication Sig   aspirin 325 MG EC tablet Take 325 mg by mouth daily.   busPIRone (BUSPAR) 10 MG tablet TAKE 1 TABLET TWICE DAILY   Calcium-Magnesium-Vitamin D (CALCIUM 1200+D3 PO) Take 1 tablet 2 (two) times daily by mouth.   DULoxetine (CYMBALTA) 60 MG capsule TAKE 1 CAPSULE EVERY DAY   fluticasone (FLONASE) 50 MCG/ACT nasal spray Place 2 sprays into both nostrils daily as needed for allergies.   omeprazole (PRILOSEC) 40 MG capsule TAKE 1 CAPSULE(40 MG) BY MOUTH TWICE DAILY   UNABLE TO FIND Med Name: Domperidone 40 mg 3x daily  ( help with digestion)   Vitamin D, Ergocalciferol, (DRISDOL) 1.25 MG (50000 UNIT) CAPS capsule Take 1 capsule (50,000 Units total) by mouth every 7 (seven) days.   No facility-administered encounter medications on file as of 12/12/2020.    Patient Active Problem List   Diagnosis Date Noted   Throat irritation 11/28/2019   HLD (hyperlipidemia)    Overactive bladder 05/08/2015   Aphasia    Cervical spondylitis (HCC)    Numbness and tingling 03/21/2015   TIA (transient ischemic attack) 03/21/2015   Physical exam 10/05/2014   Abdominal pain 05/17/2014   Allergic rhinitis 05/17/2014   Fatigue 04/04/2014   Anxiety and depression 07/25/2013   Weight loss 07/25/2013   Memory deficits 02/15/2013   Lumbar spondylosis 09/21/2012   Tachycardia 09/25/2011   Palpitations 09/16/2010   Dysphonia 09/05/2010   Slowing of urinary stream 09/25/2009   Cerebrovascular disease 04/03/2008   DYSPHONIA 04/03/2008   Diverticulosis of large  intestine 04/02/2008   Irritable bowel syndrome 04/02/2008   DEGENERATIVE JOINT DISEASE 04/02/2008   HEMATURIA, MICROSCOPIC, HX OF 04/02/2008   Vitamin D deficiency 03/28/2008   Fibromyalgia 03/28/2008   URINARY INCONTINENCE 03/28/2008   GERD 08/02/2007   Gastroparesis 08/02/2007   HEMORRHOIDS 07/30/2007   ESOPHAGEAL STRICTURE 07/30/2007   Anxiety  state 07/30/2007    Conditions to be addressed/monitored:Anxiety, Depression, GERD, and fibromyalgia, Vitamin D deficiency  Care Plan : Chronic Pain (Adult)  Updates made by Dimitri Ped, RN since 12/12/2020 12:00 AM     Problem: Chronic Pain Management (Chronic Pain)   Priority: High     Long-Range Goal: Chronic Pain Managed   Start Date: 11/07/2020  Expected End Date: 04/24/2021  This Visit's Progress: On track  Recent Progress: On track  Priority: High  Note:   Current Barriers:  Knowledge Deficits related to self-health management of acute or chronic pain form fibromyalgia with hx of anxiety/depression Chronic Disease Management support and education needs related to chronic pain Knowledge Deficits related to self management of chronic pain from fibromyalgia Chronic Disease Management support and education needs related to self management of chronic pain from fibromyalgia No Advanced Directives in place Unable to independently self management of chronic pain from fibromyalgia States that her fibromyalgia is still has been flaring up.  States that she also has been having more fatigue. States she saw neurology but she did not change any of her medications or order any tests  States she needs to see her doctor to see what has been causing her fatigue and what could make it better.  States she only walks about once a week. States she had a cyst removed from her arm that had an infection.  States it is now healing and she has decided to way to see if it does not heal properly before going to the plastic surgeon   Declines speaking with CCM LCSW at this time  Clinical Goal(s):  patient will verbalize understanding of plan for pain management. , patient will attend all scheduled medical appointments: neuro 11/21/21, patient will demonstrate use of different relaxation  skills and/or diversional activities to assist with pain reduction (distraction, imagery, relaxation, massage,  acupressure, TENS, heat, and cold application., patient will report pain at a level less than 3 to 4 on a 10-10 rating scale., patient will use pharmacological and nonpharmacological pain relief strategies as prescribed. , and patient will verbalize acceptable level of pain relief and ability to engage in desired activities Interventions:  Collaboration with [name], MD regarding development and update of comprehensive plan of care as evidenced by provider attestation and co-signature Pain assessment performed Medications reviewed Discussed plans with patient for ongoing care management follow up and provided patient with direct contact information for care management team Evaluation of current treatment plan related to self management of chronic pain from fibromyalgia and patient's adherence to plan as established by provider. Advised patient to call and schedule follow up with Dr. [name] Provided education to patient re: self management of chronic pain from fibromyalgia Reviewed medications with patient and discussed adherence Social Work referral for declines at this time to help with stress and depression Discussed plans with patient for ongoing care management follow up and provided patient with direct contact information for care management team Reviewed to try to increase her activity and to try exercise such as chair yoga or Tai Chi to help with her  Reviewed to use her insurance exercise benefit  Patient Goals/Self  Care Activities:  Will self-administer medications as prescribed Will attend all scheduled provider appointments Will call pharmacy for medication refills 7 days prior to needed refill date Patient will calls provider office for new concerns or questions - develop a new routine to improve sleep - don't eat or exercise right before bedtime - eat healthy - get at least 7 to 8 hours of sleep at night - get outdoors every day (weather permitting) - keep room cool  and dark - limit daytime naps - practice relaxation or meditation daily - take a warm shower or bath before bed - use a fan or white noise in bedroom - use devices that will help like a cane, sock-puller or reacher - learn relaxation techniques - practice acceptance of chronic pain - practice relaxation or meditation daily - spend time with positive people - tell myself I can (not I can't) - think of new ways to do favorite things - use distraction techniques - use relaxation during pain Follow Up Plan: Telephone follow up appointment with care management team member scheduled for: 01/30/21 at 2 PM The patient has been provided with contact information for the care management team and has been advised to call with any health related questions or concerns.       Care Plan : GERD and Vitamin D deficiency  Updates made by Dimitri Ped, RN since 12/12/2020 12:00 AM     Problem: Health Promotion or Disease Self-Management of GERD and Vitamin D deficiency      Long-Range Goal: Self-Management Plan Developed for GERD and Vitamin D deficiency   Start Date: 11/07/2020  Expected End Date: 04/24/2021  This Visit's Progress: On track  Recent Progress: On track  Priority: Medium  Note:   Current Barriers:  Ineffective Self Health Maintenance in a patient with  GERD and Vitamin D deficiency Unable to independently self manage GERD and Vitamin D deficiency States she has some GI upset sometimes after eating.  States she is followed by GI regularly. States her stools have been hard but she is having a bowel movement daily.  States she has been trying to eat greens and other foods with fiber. Clinical Goal(s):  Collaboration with [name], MD regarding development and update of comprehensive plan of care as evidenced by provider attestation and co-signature Inter-disciplinary care team collaboration (see longitudinal plan of care) patient will work with care management team to  address care coordination and chronic disease management needs related to Disease Management Educational Needs Care Coordination   Interventions:  Evaluation of current treatment plan related to  GERD and Vitamin D deficiency ,  self-management and patient's adherence to plan as established by provider. Collaboration with [name], MD regarding development and update of comprehensive plan of care as evidenced by provider attestation       and co-signature Inter-disciplinary care team collaboration (see longitudinal plan of care) Discussed plans with patient for ongoing care management follow up and provided patient with direct contact information for care management team Reinforced to call and schedule follow up with Dr. [name] Reviewed to eat healthy diet, drink adequate amounts of fluids and exercise regularly Reviewed to try to be sure to drink more fluids to help soften her stools. Self Care Activities:  Self administers medications as prescribed Attends all scheduled provider appointments Calls pharmacy for medication refills Attends church or other social activities Calls provider office for new concerns or questions Patient Goals: - complete a living will - discuss my treatment  options with the doctor or nurse - do one enjoyable thing every day - learn something new by asking, reading and searching the Internet every day - make shared treatment decisions with doctor - name a health care proxy (decision maker) - spend time outdoors at least 3 times a week - strengthen or fix relationships with loved ones Follow Up Plan: Telephone follow up appointment with care management team member scheduled for: 02/01/21 at 2 PM The patient has been provided with contact information for the care management team and has been advised to call with any health related questions or concerns.       Plan:Telephone follow up appointment with care management team member scheduled for:   01/30/21 The patient has been provided with contact information for the care management team and has been advised to call with any health related questions or concerns.  Peter Garter RN, BSN,CCM, CDE Care Management Coordinator [place] [phone], Mobile [phone]

## 2020-12-24 DIAGNOSIS — F419 Anxiety disorder, unspecified: Secondary | ICD-10-CM | POA: Diagnosis not present

## 2020-12-24 DIAGNOSIS — F32A Depression, unspecified: Secondary | ICD-10-CM

## 2021-01-24 DIAGNOSIS — K5900 Constipation, unspecified: Secondary | ICD-10-CM | POA: Diagnosis not present

## 2021-01-24 DIAGNOSIS — K219 Gastro-esophageal reflux disease without esophagitis: Secondary | ICD-10-CM | POA: Diagnosis not present

## 2021-01-28 ENCOUNTER — Ambulatory Visit: Payer: Medicare HMO | Admitting: Family Medicine

## 2021-01-29 ENCOUNTER — Other Ambulatory Visit: Payer: Self-pay | Admitting: Family Medicine

## 2021-01-29 DIAGNOSIS — F32A Depression, unspecified: Secondary | ICD-10-CM

## 2021-01-30 ENCOUNTER — Telehealth: Payer: Medicare HMO

## 2021-01-30 ENCOUNTER — Telehealth: Payer: Self-pay

## 2021-01-30 NOTE — Telephone Encounter (Signed)
  Care Management   Follow Up Note   01/30/2021 Name: Paige Pena MRN: [id number] DOB: 12-11-1940   Referred by: [name], MD Reason for referral : Chronic Care Management (RNCM: Follow up Outreach Chronic Care Management and coordination needs-unsuccessful)   An unsuccessful telephone outreach was attempted today. The patient was referred to the case management team for assistance with care management and care coordination.   Follow Up Plan: The care management team will reach out to the patient again over the next 30 days.  Peter Garter RN, BSN,CCM, CDE Care Management Coordinator [place] [phone], Mobile [phone]

## 2021-01-31 ENCOUNTER — Encounter: Payer: Self-pay | Admitting: Family Medicine

## 2021-01-31 ENCOUNTER — Ambulatory Visit (INDEPENDENT_AMBULATORY_CARE_PROVIDER_SITE_OTHER): Payer: Medicare HMO | Admitting: Family Medicine

## 2021-01-31 VITALS — BP 122/66 | HR 86 | Temp 97.8°F | Resp 16 | Ht 61.0 in | Wt 124.6 lb

## 2021-01-31 DIAGNOSIS — R5383 Other fatigue: Secondary | ICD-10-CM | POA: Diagnosis not present

## 2021-01-31 DIAGNOSIS — M71322 Other bursal cyst, left elbow: Secondary | ICD-10-CM | POA: Diagnosis not present

## 2021-01-31 DIAGNOSIS — N6321 Unspecified lump in the left breast, upper outer quadrant: Secondary | ICD-10-CM

## 2021-01-31 NOTE — Progress Notes (Signed)
   Subjective:    Patient ID: Paige Pena, female    DOB: 06-24-1940, 80 y.o.   MRN: [id number]  HPI Fatigue- pt reports sxs started 'some months ago'.  Will nod off during the day.  Reports she typically goes to bed late- 1-2am- but will sleep until noon-1pm.  Despite sleeping 11-12 hrs, she finds herself tired.  Will eat breakfast around 1-2pm and then a second meal around 7 pm.  L breast mass- discovered today, area 'rolls' under fingers.  Hx of cystic breasts.  Denies pain but breast feels 'different'.    L Elbow cyst- previously aspirated by dermatology.  Some burning.  Has plastic surgery appt w/ Dr Erin Hearing   Review of Systems For ROS see HPI   This visit occurred during the SARS-CoV-2 public health emergency.  Safety protocols were in place, including screening questions prior to the visit, additional usage of staff PPE, and extensive cleaning of exam room while observing appropriate contact time as indicated for disinfecting solutions.      Objective:   Physical Exam Vitals reviewed.  Constitutional:      General: She is not in acute distress.    Appearance: Normal appearance. She is well-developed. She is not ill-appearing.  HENT:     Head: Normocephalic and atraumatic.  Eyes:     Conjunctiva/sclera: Conjunctivae normal.     Pupils: Pupils are equal, round, and reactive to light.  Neck:     Thyroid: No thyromegaly.  Cardiovascular:     Rate and Rhythm: Normal rate and regular rhythm.     Heart sounds: Normal heart sounds. No murmur heard. Pulmonary:     Effort: Pulmonary effort is normal. No respiratory distress.     Breath sounds: Normal breath sounds.  Chest:  Breasts:    Left: Mass (L breast mass at 1-2 o'clock, 2 inches from areola) present.  Abdominal:     General: There is no distension.     Palpations: Abdomen is soft.     Tenderness: There is no abdominal tenderness.  Musculoskeletal:     Left elbow: Swelling (enlarged bursa) present.     Cervical back:  Normal range of motion and neck supple.  Lymphadenopathy:     Cervical: No cervical adenopathy.  Skin:    General: Skin is warm and dry.  Neurological:     Mental Status: She is alert and oriented to person, place, and time.  Psychiatric:        Behavior: Behavior normal.           Assessment & Plan:  L breast mass- new.  Will get diagnostic mammo ASAP to evaluate.  Pt expressed understanding and is in agreement w/ plan.   L bursa swelling/cyst- previously aspirated by Dermatology.  Has appt upcoming w/ plastic surgery to discuss definitive options.  Encouraged her to keep appt.

## 2021-01-31 NOTE — Patient Instructions (Addendum)
Schedule your complete physical for after April 20th [facility] notify you of your lab results and make any changes We'll call you with your mammogram appt I think Dr Erin Hearing will be able to help w/ your elbow Make sure you are drinking plenty of water throughout the day to help your energy level Make sure you are eating regularly- we need to fuel our bodies Rest when you are tired Call with any questions or concerns Stay Safe!  Stay Healthy! Happy Holidays!!

## 2021-02-01 ENCOUNTER — Ambulatory Visit: Payer: Medicare HMO | Admitting: Family Medicine

## 2021-02-01 LAB — BASIC METABOLIC PANEL
BUN: 16 mg/dL (ref 6–23)
CO2: 31 mEq/L (ref 19–32)
Calcium: 9.4 mg/dL (ref 8.4–10.5)
Chloride: 101 mEq/L (ref 96–112)
Creatinine, Ser: 0.86 mg/dL (ref 0.40–1.20)
GFR: 63.24 mL/min (ref >60.00)
Glucose, Bld: 78 mg/dL (ref 70–99)
Potassium: 4 mEq/L (ref 3.5–5.1)
Sodium: 139 mEq/L (ref 135–145)

## 2021-02-01 LAB — CBC WITH DIFFERENTIAL/PLATELET
Basophils Absolute: 0 10*3/uL (ref 0.0–0.1)
Basophils Relative: 1 % (ref 0.0–3.0)
Eosinophils Absolute: 0.1 10*3/uL (ref 0.0–0.7)
Eosinophils Relative: 1.7 % (ref 0.0–5.0)
HCT: 37.9 % (ref 36.0–46.0)
Hemoglobin: 12.7 g/dL (ref 12.0–15.0)
Lymphocytes Relative: 29.4 % (ref 12.0–46.0)
Lymphs Abs: 1.1 10*3/uL (ref 0.7–4.0)
MCHC: 33.4 g/dL (ref 30.0–36.0)
MCV: 90.6 fl (ref 78.0–100.0)
Monocytes Absolute: 0.5 10*3/uL (ref 0.1–1.0)
Monocytes Relative: 12.9 % — ABNORMAL HIGH (ref 3.0–12.0)
Neutro Abs: 2.1 10*3/uL (ref 1.4–7.7)
Neutrophils Relative %: 55 % (ref 43.0–77.0)
Platelets: 151 10*3/uL (ref 150.0–400.0)
RBC: 4.18 Mil/uL (ref 3.87–5.11)
RDW: 13.1 % (ref 11.5–15.5)
WBC: 3.7 10*3/uL — ABNORMAL LOW (ref 4.0–10.5)

## 2021-02-01 LAB — LIPID PANEL
Cholesterol: 214 mg/dL — ABNORMAL HIGH (ref 0–200)
HDL: 80.9 mg/dL (ref >39.00)
LDL Cholesterol: 120 mg/dL — ABNORMAL HIGH (ref 0–99)
NonHDL: 132.69
Total CHOL/HDL Ratio: 3
Triglycerides: 63 mg/dL (ref 0.0–149.0)
VLDL: 12.6 mg/dL (ref 0.0–40.0)

## 2021-02-01 LAB — VITAMIN D 25 HYDROXY (VIT D DEFICIENCY, FRACTURES): VITD: 40.09 ng/mL (ref 30.00–100.00)

## 2021-02-01 LAB — HEPATIC FUNCTION PANEL
ALT: 13 U/L (ref 0–35)
AST: 27 U/L (ref 0–37)
Albumin: 3.9 g/dL (ref 3.5–5.2)
Alkaline Phosphatase: 71 U/L (ref 39–117)
Bilirubin, Direct: 0.1 mg/dL (ref 0.0–0.3)
Total Bilirubin: 0.4 mg/dL (ref 0.2–1.2)
Total Protein: 6.5 g/dL (ref 6.0–8.3)

## 2021-02-01 LAB — TSH: TSH: 2.13 u[IU]/mL (ref 0.35–5.50)

## 2021-02-01 LAB — B12 AND FOLATE PANEL
Folate: 19.7 ng/mL (ref >5.9)
Vitamin B-12: 485 pg/mL (ref 211–911)

## 2021-02-02 ENCOUNTER — Other Ambulatory Visit: Payer: Self-pay | Admitting: Family Medicine

## 2021-02-02 DIAGNOSIS — N6321 Unspecified lump in the left breast, upper outer quadrant: Secondary | ICD-10-CM

## 2021-02-04 ENCOUNTER — Telehealth: Payer: Self-pay | Admitting: *Deleted

## 2021-02-04 NOTE — Chronic Care Management (AMB) (Signed)
  Care Management   Note  02/04/2021 Name: Paige Pena MRN: [id number] DOB: 1941-01-20  Paige Pena is a 80 y.o. year old female who is a primary care patient of [name], [name], MD and is actively engaged with the care management team. I reached out to [name] by phone today to assist with re-scheduling a follow up visit with the RN Case Manager  Follow up plan: Unsuccessful telephone outreach attempt made. A HIPAA compliant phone message was left for the patient providing contact information and requesting a return call.  The care management team will reach out to the patient again over the next 7 days.  If patient returns call to provider office, please advise to call [place] at [phone].  [place] Management  Direct Dial: [phone]

## 2021-02-05 ENCOUNTER — Telehealth: Payer: Self-pay

## 2021-02-05 NOTE — Telephone Encounter (Signed)
-----   Message from [name], MD sent at 02/04/2021  7:38 AM EST ----- Labs look fantastic!  No obvious cause of fatigue- Vit D, B12, folate are all normal.  No evidence of anemia.  Thyroid is within normal limits.   This is great news!

## 2021-02-05 NOTE — Telephone Encounter (Signed)
LVM for patient regarding labs

## 2021-02-07 ENCOUNTER — Telehealth: Payer: Self-pay

## 2021-02-07 NOTE — Telephone Encounter (Signed)
Caller name:Ressie Regenia Skeeter   On DPR? :Yes  Call back [phone]  Provider they see: [name]   Reason for call: pt is wanting referral for diagnostic mammogram sent to [facility] they can get her in 12/30 she does not want [facility] imaging

## 2021-02-08 NOTE — Telephone Encounter (Signed)
Order printed, corrected and faxed. Patient aware

## 2021-02-11 ENCOUNTER — Telehealth: Payer: Self-pay

## 2021-02-11 ENCOUNTER — Encounter: Payer: Self-pay | Admitting: Plastic Surgery

## 2021-02-11 ENCOUNTER — Ambulatory Visit (INDEPENDENT_AMBULATORY_CARE_PROVIDER_SITE_OTHER): Payer: Medicare HMO | Admitting: Plastic Surgery

## 2021-02-11 ENCOUNTER — Other Ambulatory Visit: Payer: Self-pay

## 2021-02-11 VITALS — BP 153/76 | HR 84 | Ht 61.0 in | Wt 123.0 lb

## 2021-02-11 DIAGNOSIS — D489 Neoplasm of uncertain behavior, unspecified: Secondary | ICD-10-CM | POA: Diagnosis not present

## 2021-02-11 NOTE — Telephone Encounter (Signed)
Patient called to say she saw Dr. Erin Hearing today for a cyst on her left arm, near her elbow.  Patient said she was recently told she has a cyst in her left breast.  She would like to know if there is any correlation since they are both on her left side.  Please call.

## 2021-02-11 NOTE — Chronic Care Management (AMB) (Signed)
°  Care Management   Note  02/11/2021 Name: ADALYNE LOVICK MRN: [id number] DOB: 07-04-1940  KANYON BUNN is a 80 y.o. year old female who is a primary care patient of [name], [name], MD and is actively engaged with the care management team. I reached out to [name] by phone today to assist with re-scheduling a follow up visit with the RN Case Manager  Follow up plan: Telephone appointment with care management team member scheduled for:03/13/21  [place] Management  Direct Dial: [phone]

## 2021-02-11 NOTE — Progress Notes (Signed)
Referring Provider [name], MD [address] [place],  [place] [address]   CC:  Left elbow lesion   Paige Pena is an 80 y.o. female.  HPI: 80 year old with left elbow lesion.  The patient notes that dermatology removed some of the lesion but did not remove the lesion completely.  She notes some white stuff came out of the lesion previously.  Allergies  Allergen Reactions   Morphine Other (See Comments)    REACTION: lethargy/malaise   Metoclopramide Hcl Anxiety and Other (See Comments)    REACTION: anxious and out of her mind   Aricept [Donepezil Hcl]     nausea   Doxycycline Diarrhea   Penicillins Itching and Rash    Has patient had a PCN reaction causing immediate rash, facial/tongue/throat swelling, SOB or lightheadedness with hypotension: No Has patient had a PCN reaction causing severe rash involving mucus membranes or skin necrosis: No Has patient had a PCN reaction that required hospitalization No Has patient had a PCN reaction occurring within the last 10 years: Yes If all of the above answers are "NO", then may proceed with Cephalosporin use.     Prednisone Itching and Rash   Sulfonamide Derivatives Itching and Rash    Outpatient Encounter Medications as of 02/11/2021  Medication Sig   aspirin 325 MG EC tablet Take 325 mg by mouth daily.   Calcium-Magnesium-Vitamin D (CALCIUM 1200+D3 PO) Take 1 tablet 2 (two) times daily by mouth.   DULoxetine (CYMBALTA) 60 MG capsule TAKE 1 CAPSULE EVERY DAY   fluticasone (FLONASE) 50 MCG/ACT nasal spray Place 2 sprays into both nostrils daily as needed for allergies.   omeprazole (PRILOSEC) 40 MG capsule TAKE 1 CAPSULE(40 MG) BY MOUTH TWICE DAILY   UNABLE TO FIND Med Name: Domperidone 40 mg 3x daily  ( help with digestion)   Vitamin D, Ergocalciferol, (DRISDOL) 1.25 MG (50000 UNIT) CAPS capsule Take 1 capsule (50,000 Units total) by mouth every 7 (seven) days.   No facility-administered encounter medications  on file as of 02/11/2021.     Past Medical History:  Diagnosis Date   Acute cystitis    Anxiety    Aphasia    transsient aphasia Jan 2012   Asthmatic bronchitis    Cerebrovascular disease    Chest pain, atypical    Diverticulosis of colon    DJD (degenerative joint disease)    Dysphonia    Esophageal stricture    Fibromyalgia    Gastroparesis    GERD (gastroesophageal reflux disease)    Hematuria, microscopic    Hemorrhoids    Hypercholesteremia    IBS (irritable bowel syndrome)    Memory deficits 02/15/2013   Memory loss    RLS (restless legs syndrome)    TIA (transient ischemic attack)    Urinary incontinence    Vitamin D deficiency     Past Surgical History:  Procedure Laterality Date   ABDOMINAL HYSTERECTOMY  1995   corneal transplant for keratonconus     EYE SURGERY  2013   left foot fracture with ORIF  07/2004   Dr. Sharol Given   right bunion surgery      Family History  Problem Relation Age of Onset   Hyperlipidemia Sister    Diabetes Brother    Cancer Brother        lung   Hypertension Mother    Anemia Mother    Dementia Mother    Kidney disease Father    Breast  cancer Other        aunt   Diabetes Other        aunt,brother,uncle   Kidney disease Other        father   Dementia Maternal Grandmother     Social History   Social History Narrative   Paige Pena   [place], daughter   Tameah Mihalko info release form to share her medical records with her children   Lives at home w/ her husband   Right-handed   Caffeine: tea on occasion     Review of Systems General: Denies fevers, chills, weight loss CV: Denies chest pain, shortness of breath, palpitations   Physical Exam Vitals with BMI 02/11/2021 01/31/2021 11/21/2020  Height 5\' 1"  5\' 1"  5\' 1"   Weight 123 lbs 124 lbs 10 oz 124 lbs  BMI 23.25 90.21 11.55  Systolic 208 022 336  Diastolic 76 66 80  Pulse 84 86 78    General:  No acute distress,  Alert and  oriented, Non-Toxic, Normal speech and affect Skin:  1 cm left elbow cystic lesion, some overlying hyperpigmentation   Assessment/Plan 80 year old left elbow lesion, most likely sebaceous cyst.  Will excise in OR given proximity to the ulnar nerve.  18 minutes were spent with the patient.  Time was spent reviewing records, discussing surgical procedures with the patient and reviewing risks and benefits.  We discussed risks and benefits of excision including risk to ulnar nerve.   [name] 02/11/2021, 2:51 PM

## 2021-02-12 NOTE — Telephone Encounter (Signed)
I don't think they are related other than if it is a sebaceous cyst people who get sebaceous cysts sometimes get them everywhere.  I would have to examine the breast lesion if she wants me to treat that.  She is also supposed to get [place] a pathology report for the elbow lesion since she states that her dermatologist sent path.  Thanks, [employer]

## 2021-02-14 NOTE — Telephone Encounter (Signed)
Called patient, advised I spoke with Dr. Erin Pena. His did not think the 2 cyst are related. Most likely people who have a sebaceous cyst can have them anywhere on their body.  Indicated that Dr. Erin Pena offered to treat the cyst found on her left breast. Paige Pena has an appointment for US of the breast along with MMG on 02/22/21. She will follow up once the results come in.  Called Dr. Eula Listen office and requested a pathology report in regards to elbow lesion. Spoke with Estill Bamberg, verified the test was a culture. Results were faxed to our office.

## 2021-03-04 DIAGNOSIS — H2513 Age-related nuclear cataract, bilateral: Secondary | ICD-10-CM | POA: Diagnosis not present

## 2021-03-04 DIAGNOSIS — Z8669 Personal history of other diseases of the nervous system and sense organs: Secondary | ICD-10-CM | POA: Diagnosis not present

## 2021-03-04 DIAGNOSIS — Z9889 Other specified postprocedural states: Secondary | ICD-10-CM | POA: Diagnosis not present

## 2021-03-04 DIAGNOSIS — Z87898 Personal history of other specified conditions: Secondary | ICD-10-CM | POA: Diagnosis not present

## 2021-03-04 DIAGNOSIS — Z947 Corneal transplant status: Secondary | ICD-10-CM | POA: Diagnosis not present

## 2021-03-04 DIAGNOSIS — Z7952 Long term (current) use of systemic steroids: Secondary | ICD-10-CM | POA: Diagnosis not present

## 2021-03-06 DIAGNOSIS — N6012 Diffuse cystic mastopathy of left breast: Secondary | ICD-10-CM | POA: Diagnosis not present

## 2021-03-06 DIAGNOSIS — R928 Other abnormal and inconclusive findings on diagnostic imaging of breast: Secondary | ICD-10-CM | POA: Diagnosis not present

## 2021-03-06 LAB — HM MAMMOGRAPHY

## 2021-03-13 ENCOUNTER — Telehealth: Payer: Self-pay

## 2021-03-13 ENCOUNTER — Telehealth: Payer: Medicare HMO

## 2021-03-13 NOTE — Telephone Encounter (Signed)
°  Care Management   Follow Up Note   03/13/2021 Name: Paige Pena MRN: [id number] DOB: September 08, 1940   Referred by: [name], MD Reason for referral : Chronic Care Management (RNCM: Follow up Outreach Chronic Care Management and coordination needs- 2nd unsuccessful outreach attempt )   A second unsuccessful telephone outreach was attempted today. The patient was referred to the case management team for assistance with care management and care coordination.   Follow Up Plan: The care management team will reach out to the patient again over the next 30 days.  Peter Garter RN, BSN,CCM, CDE Care Management Coordinator [place] [phone], Mobile [phone]

## 2021-03-21 ENCOUNTER — Telehealth: Payer: Self-pay | Admitting: *Deleted

## 2021-03-21 NOTE — Chronic Care Management (AMB) (Signed)
°  Care Management   Note  03/21/2021 Name: Paige Pena MRN: [id number] DOB: 16-Mar-1940  Paige Pena is a 81 y.o. year old female who is a primary care patient of [name], [name], MD and is actively engaged with the care management team. I reached out to [name] by phone today to assist with re-scheduling a follow up visit with the RN Case Manager  Follow up plan: Unsuccessful telephone outreach attempt made. A HIPAA compliant phone message was left for the patient providing contact information and requesting a return call.   Julian Hy, [place] Management  Direct Dial: [phone]

## 2021-03-28 ENCOUNTER — Encounter: Payer: Self-pay | Admitting: Family Medicine

## 2021-03-28 DIAGNOSIS — R351 Nocturia: Secondary | ICD-10-CM | POA: Diagnosis not present

## 2021-03-28 DIAGNOSIS — R3121 Asymptomatic microscopic hematuria: Secondary | ICD-10-CM | POA: Diagnosis not present

## 2021-04-09 ENCOUNTER — Encounter (HOSPITAL_COMMUNITY): Payer: Self-pay | Admitting: *Deleted

## 2021-04-09 ENCOUNTER — Emergency Department (HOSPITAL_COMMUNITY): Payer: Medicare HMO

## 2021-04-09 ENCOUNTER — Other Ambulatory Visit: Payer: Self-pay

## 2021-04-09 ENCOUNTER — Emergency Department (HOSPITAL_COMMUNITY)
Admission: EM | Admit: 2021-04-09 | Discharge: 2021-04-09 | Disposition: A | Discharge status: Home / Self Care | Payer: Medicare HMO | Attending: Emergency Medicine | Admitting: Emergency Medicine

## 2021-04-09 DIAGNOSIS — S6991XA Unspecified injury of right wrist, hand and finger(s), initial encounter: Secondary | ICD-10-CM | POA: Diagnosis present

## 2021-04-09 DIAGNOSIS — R519 Headache, unspecified: Secondary | ICD-10-CM | POA: Insufficient documentation

## 2021-04-09 DIAGNOSIS — S60811A Abrasion of right wrist, initial encounter: Secondary | ICD-10-CM | POA: Insufficient documentation

## 2021-04-09 DIAGNOSIS — W19XXXA Unspecified fall, initial encounter: Secondary | ICD-10-CM

## 2021-04-09 DIAGNOSIS — R0782 Intercostal pain: Secondary | ICD-10-CM | POA: Insufficient documentation

## 2021-04-09 DIAGNOSIS — Y92009 Unspecified place in unspecified non-institutional (private) residence as the place of occurrence of the external cause: Secondary | ICD-10-CM | POA: Diagnosis not present

## 2021-04-09 DIAGNOSIS — Y93E5 Activity, floor mopping and cleaning: Secondary | ICD-10-CM | POA: Diagnosis not present

## 2021-04-09 DIAGNOSIS — W11XXXA Fall on and from ladder, initial encounter: Secondary | ICD-10-CM | POA: Diagnosis not present

## 2021-04-09 DIAGNOSIS — M542 Cervicalgia: Secondary | ICD-10-CM | POA: Diagnosis not present

## 2021-04-09 DIAGNOSIS — S0990XA Unspecified injury of head, initial encounter: Secondary | ICD-10-CM | POA: Diagnosis not present

## 2021-04-09 DIAGNOSIS — S199XXA Unspecified injury of neck, initial encounter: Secondary | ICD-10-CM | POA: Diagnosis not present

## 2021-04-09 DIAGNOSIS — Z043 Encounter for examination and observation following other accident: Secondary | ICD-10-CM | POA: Diagnosis not present

## 2021-04-09 DIAGNOSIS — M4312 Spondylolisthesis, cervical region: Secondary | ICD-10-CM | POA: Diagnosis not present

## 2021-04-09 NOTE — ED Triage Notes (Addendum)
Pt ambulatory with steady gait to triage with her husband, says about an hour ago, she was on a 4 foot ladder (about half way up) cleaning a mirror and fell off onto the hardwood floor. C/o pain on the right side of her head, no LOC, no blood thinners. Mild pain at the base of the neck and upper back area, abrasion to the right wrist.

## 2021-04-09 NOTE — ED Provider Notes (Signed)
[facility] EMERGENCY DEPARTMENT Provider Note   CSN: [id number] Arrival date & time: 04/09/21  0034     History  Chief Complaint  Patient presents with   Lytle Michaels    Paige Pena is a 81 y.o. female.  The history is provided by the patient and medical records.  Fall  82 year old female with history of hyperlipidemia, arthritis, anxiety, history of TIA, GERD, fibromyalgia, presenting to the ED after a fall.  She was only stepladder cleaning a mirror, about halfway up the ladder, estimated about 4 feet off the floor.  States she was looking in the mirror attempting to step down off the ladder when she missed a step and fell onto her right side.  She did strike her head on the hard floor but denied loss of consciousness.  States she has some pain in the right side of her head, neck, right ribs, and bilateral wrists.  She denies any numbness or tingling of her extremities.  She remains ambulatory.  She takes daily aspirin but no formal anticoagulation.  No meds taken PTA.  Home Medications Prior to Admission medications   Medication Sig Start Date End Date Taking? Authorizing Provider  aspirin 325 MG EC tablet Take 325 mg by mouth daily.    [provider]  Calcium-Magnesium-Vitamin D (CALCIUM 1200+D3 PO) Take 1 tablet 2 (two) times daily by mouth.    [provider]  DULoxetine (CYMBALTA) 60 MG capsule TAKE 1 CAPSULE EVERY DAY 11/09/20   [name], MD  fluticasone [facility]) 50 MCG/ACT nasal spray Place 2 sprays into both nostrils daily as needed for allergies. 12/01/18   [name], MD  omeprazole (PRILOSEC) 40 MG capsule TAKE 1 CAPSULE(40 MG) BY MOUTH TWICE DAILY 11/01/20   [name], MD  UNABLE TO FIND Med Name: Domperidone 40 mg 3x daily  ( help with digestion)    [provider]  Vitamin D, Ergocalciferol, (DRISDOL) 1.25 MG (50000 UNIT) CAPS capsule Take 1 capsule (50,000 Units total) by mouth every 7 (seven) days.  06/14/20   [name], MD      Allergies    Morphine, Metoclopramide hcl, Aricept [donepezil hcl], Doxycycline, Penicillins, Prednisone, and Sulfonamide derivatives    Review of Systems   Review of Systems  Musculoskeletal:  Positive for arthralgias and neck pain.  All other systems reviewed and are negative.  Physical Exam Updated Vital Signs BP 120/69 (BP Location: Left Arm)    Pulse 69    Temp 98.4 F (36.9 C) (Oral)    Resp 15    SpO2 98%   Physical Exam Vitals and nursing note reviewed.  Constitutional:      Appearance: She is well-developed.  HENT:     Head: Normocephalic and atraumatic.  Eyes:     Conjunctiva/sclera: Conjunctivae normal.     Pupils: Pupils are equal, round, and reactive to light.  Cardiovascular:     Rate and Rhythm: Normal rate and regular rhythm.     Heart sounds: Normal heart sounds.  Pulmonary:     Effort: Pulmonary effort is normal.     Breath sounds: Normal breath sounds.  Chest:     Comments: Very minimal tenderness of right lower posterior ribs, no bruising or deformity noted Abdominal:     General: Bowel sounds are normal.     Palpations: Abdomen is soft.  Musculoskeletal:        General: Normal range of motion.     Cervical back: Normal  range of motion.     Comments: Minor abrasion right volar wrist, able to range wrist with minimal pain, radial pulse intact, normal sensation to all fingers, good cap refill, normal grips  Skin:    General: Skin is warm and dry.  Neurological:     Mental Status: She is alert and oriented to person, place, and time.     Comments: AAOx3, answering questions and following commands appropriately; equal strength UE and LE bilaterally; CN grossly intact; moves all extremities appropriately without ataxia; no focal neuro deficits or facial asymmetry appreciated    ED Results / Procedures / Treatments   Labs (all labs ordered are listed, but only abnormal results are displayed) Labs Reviewed - No data  to display  EKG None  Radiology DG Ribs Unilateral W/Chest Right  Result Date: 04/09/2021 CLINICAL DATA:  81 year old female status post fall from ladder. EXAM: RIGHT RIBS AND CHEST - 3+ VIEW COMPARISON:  Chest radiographs 10/07/2012 and earlier. FINDINGS: Lung volumes and mediastinal contours remain within normal limits. Visualized tracheal air column is within normal limits. No pneumothorax, pulmonary edema, pleural effusion, or confluent pulmonary opacity. Chronic thoracolumbar scoliosis, mostly levoconvex. Bone mineralization is within normal limits for age. Two oblique views of the right ribs. Normal thoracic segmentation. No marked area of concern. No displaced right rib fracture identified. Other visible osseous structures appear intact. Some degenerative changes at the right glenohumeral joint. Paucity of bowel gas in the visible abdomen. IMPRESSION: 1. No displaced right rib fracture identified. 2. No acute cardiopulmonary abnormality. Electronically Signed   By: Genevie Ann M.D.   On: 04/09/2021 05:22   DG Wrist Complete Left  Result Date: 04/09/2021 CLINICAL DATA:  81 year old female status post fall from ladder. EXAM: LEFT WRIST - COMPLETE 3+ VIEW COMPARISON:  None. FINDINGS: Three views. Bone mineralization is within normal limits for age. Distal radius and ulna appear intact. Carpal bone alignment within normal limits. Chronic degeneration at the 1st [facility] joint with severe joint space loss, bulky osteophytosis and regional sclerosis. Visible metacarpals appear intact. No acute osseous abnormality identified. IMPRESSION: 1. No acute fracture or dislocation identified about the left wrist. 2. 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 Electronically Signed   By: Genevie Ann M.D.   On: 04/09/2021 05:24   DG Wrist Complete Right  Result Date: 04/09/2021 CLINICAL DATA:  81 year old female status post fall from ladder. EXAM: RIGHT WRIST - COMPLETE 3+ VIEW COMPARISON:  None. FINDINGS: Three views. Bone  mineralization is within normal limits for age. Distal radius and ulna appear intact. Carpal bones appear intact. Scapholunate interval appears at the upper limits of normal. Chronic joint space loss with subchondral sclerosis, osteophytosis and fragmentation along the radial carpal row maximal at the 1st [facility] joint. But visible metacarpals appear intact. IMPRESSION: 1. No acute fracture or dislocation identified about the right wrist. 2. 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 Electronically Signed   By: Genevie Ann M.D.   On: 04/09/2021 05:23   CT Head Wo Contrast  Result Date: 04/09/2021 CLINICAL DATA:  Trauma. EXAM: CT HEAD WITHOUT CONTRAST CT CERVICAL SPINE WITHOUT CONTRAST TECHNIQUE: Multidetector CT imaging of the head and cervical spine was performed following the standard protocol without intravenous contrast. Multiplanar CT image reconstructions of the cervical spine were also generated. RADIATION DOSE REDUCTION: This exam was performed according to the departmental dose-optimization program which includes automated exposure control, adjustment of the mA and/or kV according to patient size and/or use of iterative reconstruction technique. COMPARISON:  Head CT dated 12/26/2017. FINDINGS: CT HEAD FINDINGS Brain: Mild age-related atrophy and chronic microvascular ischemic changes. There is no acute intracranial hemorrhage. No mass effect or midline shift. No extra-axial fluid collection. Vascular: No hyperdense vessel or unexpected calcification. Skull: Normal. Negative for fracture or focal lesion. Sinuses/Orbits: No acute finding. Other: None CT CERVICAL SPINE FINDINGS Alignment: No acute subluxation. There is straightening of normal cervical lordosis which may be positional or due to muscle spasm. Skull base and vertebrae: No acute fracture. Soft tissues and spinal canal: No prevertebral fluid or swelling. No visible canal hematoma. Disc levels: Multiple degenerative changes with disc space  narrowing and endplate irregularity and spurring. Grade 1 C4-C5 anterolisthesis. Upper chest: Negative. Other: None IMPRESSION: 1. No acute intracranial pathology. Mild age-related atrophy and chronic microvascular ischemic changes. 2. No acute/traumatic cervical spine pathology. Degenerative changes. Electronically Signed   By: Anner Crete M.D.   On: 04/09/2021 01:52   CT Cervical Spine Wo Contrast  Result Date: 04/09/2021 CLINICAL DATA:  Trauma. EXAM: CT HEAD WITHOUT CONTRAST CT CERVICAL SPINE WITHOUT CONTRAST TECHNIQUE: Multidetector CT imaging of the head and cervical spine was performed following the standard protocol without intravenous contrast. Multiplanar CT image reconstructions of the cervical spine were also generated. RADIATION DOSE REDUCTION: This exam was performed according to the departmental dose-optimization program which includes automated exposure control, adjustment of the mA and/or kV according to patient size and/or use of iterative reconstruction technique. COMPARISON:  Head CT dated 12/26/2017. FINDINGS: CT HEAD FINDINGS Brain: Mild age-related atrophy and chronic microvascular ischemic changes. There is no acute intracranial hemorrhage. No mass effect or midline shift. No extra-axial fluid collection. Vascular: No hyperdense vessel or unexpected calcification. Skull: Normal. Negative for fracture or focal lesion. Sinuses/Orbits: No acute finding. Other: None CT CERVICAL SPINE FINDINGS Alignment: No acute subluxation. There is straightening of normal cervical lordosis which may be positional or due to muscle spasm. Skull base and vertebrae: No acute fracture. Soft tissues and spinal canal: No prevertebral fluid or swelling. No visible canal hematoma. Disc levels: Multiple degenerative changes with disc space narrowing and endplate irregularity and spurring. Grade 1 C4-C5 anterolisthesis. Upper chest: Negative. Other: None IMPRESSION: 1. No acute intracranial pathology. Mild  age-related atrophy and chronic microvascular ischemic changes. 2. No acute/traumatic cervical spine pathology. Degenerative changes. Electronically Signed   By: Anner Crete M.D.   On: 04/09/2021 01:52    Procedures Procedures    Medications Ordered in ED Medications - No data to display  ED Course/ Medical Decision Making/ A&P                           Medical Decision Making Amount and/or Complexity of Data Reviewed Radiology: ordered and independent interpretation performed.   81 year old female presenting to the ED after mechanical fall while trying to step off a ladder.  She was approximately 4 feet in the air trying to clean a mirror.  Did strike her head but denies loss of consciousness.  She is on daily aspirin but no formal anticoagulation.  She is awake, alert, appropriately oriented here.  Endorses pain in her right head, neck, wrist, and right ribs.  Small abrasion to the right volar wrist but there are no gross bony deformities on exam.  Imaging of affected areas obtained and reviewed, no acute findings.  She remains neurologically intact and ambulatory here in the ED.  Stable for discharge with symptomatic care.  Can follow-up with PCP.  Return  here for new concerns.  Final Clinical Impression(s) / ED Diagnoses Final diagnoses:  Fall, initial encounter    Rx / DC Orders ED Discharge Orders     None         Larene Pickett, PA-C 04/09/21 [id number]    Orpah Greek, MD 04/09/21 [phone]

## 2021-04-09 NOTE — Discharge Instructions (Signed)
Imaging today was all normal. Can take tylenol or motrin as needed for pain. Please follow-up with your primary care doctor. Return here for new concerns.

## 2021-04-10 ENCOUNTER — Telehealth: Payer: Self-pay | Admitting: Adult Health

## 2021-04-10 NOTE — Telephone Encounter (Signed)
Spoke to patient informed patient she needs to go back to ER or Urgent care  ASAP to be re evaluated because she is still having symptoms after she fell Monday night. Informed patient symptoms may show up to 24 to 48 hours  after she falls. Informed patient we are not a urgent care and she needs to be seen ASAP. Pt finally agreed and said she will go right now.

## 2021-04-10 NOTE — Telephone Encounter (Signed)
LVM at 2:00 pm: [place] Monday night and had to go to the ED. I hit my head, now feel lightheaded and have little sensation in my head. Would like a call back.

## 2021-04-16 NOTE — Chronic Care Management (AMB) (Signed)
°  Care Management   Note  04/16/2021 Name: [name] MRN: [id number] DOB: [birth year]  Paige Pena is a 81 y.o. year old female who is a primary care patient of [name], [name], MD and is actively engaged with the care management team. I reached out to [name] by phone today to assist with re-scheduling a follow up visit with the RN Case Manager  Follow up plan: 2nd Unsuccessful telephone outreach attempt made. A HIPAA compliant phone message was left for the patient providing contact information and requesting a return call.   Julian Hy, [place] Management  Direct Dial: [phone]

## 2021-04-19 ENCOUNTER — Ambulatory Visit: Payer: Medicare HMO | Admitting: Family Medicine

## 2021-04-23 ENCOUNTER — Ambulatory Visit: Payer: Medicare HMO | Admitting: Family Medicine

## 2021-04-26 ENCOUNTER — Encounter: Payer: Self-pay | Admitting: Family Medicine

## 2021-04-26 ENCOUNTER — Ambulatory Visit (INDEPENDENT_AMBULATORY_CARE_PROVIDER_SITE_OTHER): Payer: Medicare HMO | Admitting: Family Medicine

## 2021-04-26 VITALS — BP 120/68 | HR 62 | Temp 97.5°F | Resp 16 | Wt 118.0 lb

## 2021-04-26 DIAGNOSIS — W11XXXD Fall on and from ladder, subsequent encounter: Secondary | ICD-10-CM

## 2021-04-26 DIAGNOSIS — M7989 Other specified soft tissue disorders: Secondary | ICD-10-CM

## 2021-04-26 DIAGNOSIS — F32A Depression, unspecified: Secondary | ICD-10-CM

## 2021-04-26 DIAGNOSIS — M545 Low back pain, unspecified: Secondary | ICD-10-CM | POA: Diagnosis not present

## 2021-04-26 DIAGNOSIS — F419 Anxiety disorder, unspecified: Secondary | ICD-10-CM

## 2021-04-26 MED ORDER — BUSPIRONE HCL 5 MG PO TABS: 5.0000 mg | ORAL_TABLET | Freq: Two times a day (BID) | ORAL | 1 refills | Status: DC

## 2021-04-26 NOTE — Progress Notes (Signed)
? ?Subjective:  ? ? Patient ID: Paige Pena, female    DOB: Jun 11, 1940, 81 y.o.   MRN: [id number] ? ?HPI ?ER f/u- pt presented to the ER on 2/14 after falling off a step ladder (~4 feet).  She hit her head on the floor but did not lose consciousness.  Had pain on R side of head, neck, ribs, and wrists bilaterally.  She had CT had and neck that were unremarkable.  Xray of ribs and wrists were negative for acute injury. ? ?Here today for f/u.  Reports she has some soreness/tightness around her waist- 'like I feel twisted or something'.  Reports some TTP over R top of head.  Some mild bruising of L lower leg.  Slept on a heating pad across her back. ? ?Grief- pt reports 'i have lost so many of my senior relatives'.  Last passed May 2022.  'i just feel so stressed'.  'i just don't feel like [place]'.  3 children live out of state.  Pt reports 'i can just sleep and sleep and sleep'.  Has to push herself to do things, but once she is out, she enjoys things.  Has been doing [employer] rather than attending in person.  Increased irritability w/ husband.  5 lb weight loss- 'i don't want to cook'. ? ? ?Review of Systems ?For ROS see HPI  ? ?This visit occurred during the SARS-CoV-2 public health emergency.  Safety protocols were in place, including screening questions prior to the visit, additional usage of staff PPE, and extensive cleaning of exam room while observing appropriate contact time as indicated for disinfecting solutions.   ?   ?Objective:  ? Physical Exam ?Vitals reviewed.  ?Constitutional:   ?   General: She is not in acute distress. ?   Appearance: Normal appearance. She is not ill-appearing.  ?HENT:  ?   Head: Normocephalic and atraumatic.  ?Eyes:  ?   Extraocular Movements: Extraocular movements intact.  ?   Conjunctiva/sclera: Conjunctivae normal.  ?   Pupils: Pupils are equal, round, and reactive to light.  ?Musculoskeletal:     ?   General: Swelling (mild effusion of R knee, no redness or warmth.  no  TTP) present.  ?Skin: ?   General: Skin is warm and dry.  ?   Findings: Bruising (faint bruising of L lower leg) present.  ?Neurological:  ?   General: No focal deficit present.  ?   Mental Status: She is alert and oriented to person, place, and time.  ?   Cranial Nerves: No cranial nerve deficit.  ?   Motor: No weakness.  ?   Gait: Gait normal.  ?Psychiatric:  ?   Comments: Tearful, withdrawn  ? ? ? ? ? ?   ?Assessment & Plan:  ? ?Bilateral low back pain- new.  Sxs started since her recent fall.  No evidence of fx.  Suspect muscle spasm/strain.  Will refer to PT for evaluation and tx.  Encouraged heating pad and tylenol for pain relief.  Pt expressed understanding and is in agreement w/ plan.  ? ?R leg swelling- new.  Suspect this is due to her recent fall from the ladder.  Daughter is very worried about a blood clot and is requesting d dimer to determine if additional imaging or work up is needed.  Told both pt and daughter that the likelihood of clot is very low as she doesn't have risk factors- no hypercoagulable state, no recent immobility.  Discussed that older  adults may have false positive D dimers and then we're required to work it up w/ additional imaging.  Pt and daughter want to proceed w/ lab draw even though I explained it may not be covered by insurance based on leg swelling but in the absence of tachycardia, SOB, hypoxia.  They expressed understanding and still want to proceed. ?

## 2021-04-26 NOTE — Patient Instructions (Addendum)
Follow up as scheduled in April ?We'll call you with your counseling appt and the physical therapy appt ?Continue to use a heating pad for your back ?Tylenol as needed for pain ?You are strong and you will get through this ?Call with any questions or concerns ?Hang in there!!! ? ?

## 2021-04-27 LAB — D-DIMER, QUANTITATIVE: D-Dimer, Quant: 0.47 mcg/mL FEU (ref <0.50)

## 2021-04-28 ENCOUNTER — Encounter: Payer: Self-pay | Admitting: Family Medicine

## 2021-04-28 NOTE — Assessment & Plan Note (Signed)
Deteriorated.  Pt is feeling very alone.  Older relatives have passed.  Children live out of state and have their own lives.  Reports feeling stressed, overwhelmed, disinterested in her previous activities.  Excessive sleep.  Increased irritability.  + weight loss.  Currently on Cymbalta.  Willing to retry Buspar to see if this helps her anxiety.  Open to idea of counseling.  Referral placed.  Total time spent w/ pt in exam room and daughter on the phone- 56 minutes, >50% spent counseling. ?

## 2021-04-29 ENCOUNTER — Telehealth: Payer: Self-pay

## 2021-04-29 NOTE — Telephone Encounter (Signed)
-----   Message from [name], MD sent at 04/28/2021  8:06 PM EST ----- ?Normal DDimer.  No evidence of clots.  Great news! ?

## 2021-04-29 NOTE — Telephone Encounter (Signed)
Patient aware.

## 2021-05-08 ENCOUNTER — Other Ambulatory Visit: Payer: Self-pay | Admitting: Family Medicine

## 2021-05-08 DIAGNOSIS — F32A Depression, unspecified: Secondary | ICD-10-CM

## 2021-05-10 DIAGNOSIS — R911 Solitary pulmonary nodule: Secondary | ICD-10-CM | POA: Diagnosis not present

## 2021-05-10 DIAGNOSIS — R3121 Asymptomatic microscopic hematuria: Secondary | ICD-10-CM | POA: Diagnosis not present

## 2021-05-10 DIAGNOSIS — K5730 Diverticulosis of large intestine without perforation or abscess without bleeding: Secondary | ICD-10-CM | POA: Diagnosis not present

## 2021-05-10 DIAGNOSIS — R319 Hematuria, unspecified: Secondary | ICD-10-CM | POA: Diagnosis not present

## 2021-05-10 DIAGNOSIS — N281 Cyst of kidney, acquired: Secondary | ICD-10-CM | POA: Diagnosis not present

## 2021-05-13 ENCOUNTER — Other Ambulatory Visit: Payer: Self-pay | Admitting: Family Medicine

## 2021-05-13 ENCOUNTER — Other Ambulatory Visit: Payer: Self-pay

## 2021-05-13 DIAGNOSIS — K219 Gastro-esophageal reflux disease without esophagitis: Secondary | ICD-10-CM

## 2021-05-13 MED ORDER — OMEPRAZOLE 40 MG PO CPDR: DELAYED_RELEASE_CAPSULE | ORAL | 0 refills | Status: DC

## 2021-05-13 MED ORDER — BUSPIRONE HCL 5 MG PO TABS: 5.0000 mg | ORAL_TABLET | Freq: Two times a day (BID) | ORAL | 1 refills | Status: DC

## 2021-05-17 DIAGNOSIS — R3121 Asymptomatic microscopic hematuria: Secondary | ICD-10-CM | POA: Diagnosis not present

## 2021-05-20 ENCOUNTER — Telehealth: Payer: Self-pay

## 2021-05-20 NOTE — Telephone Encounter (Signed)
I was unaware that pt is working.  Please ask her what specifically she needs to be reduced so I can draft an appropriate letter ?

## 2021-05-20 NOTE — Telephone Encounter (Signed)
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 Patient would like to speak with you if possible to explain better as well.  ?

## 2021-05-20 NOTE — Telephone Encounter (Signed)
Patient called in stating that she would like a letter from Dr.Tabori that states patient needs to reduce her work load. States she has already discussed how bad her anxiety has been with Dr.Tabori. Asked for a call back if any questions.  ?

## 2021-06-12 NOTE — Telephone Encounter (Signed)
Pt called in stating that she missed a call from you, please return phone call  ?

## 2021-06-13 NOTE — Telephone Encounter (Signed)
Lvm for patient to return my call ?

## 2021-06-20 ENCOUNTER — Encounter: Payer: Medicare HMO | Admitting: Family Medicine

## 2021-07-01 ENCOUNTER — Encounter: Payer: Medicare HMO | Admitting: Family Medicine

## 2021-07-11 ENCOUNTER — Ambulatory Visit (INDEPENDENT_AMBULATORY_CARE_PROVIDER_SITE_OTHER): Payer: Medicare HMO

## 2021-07-11 DIAGNOSIS — Z0000 Encounter for general adult medical examination without abnormal findings: Secondary | ICD-10-CM

## 2021-07-11 NOTE — Progress Notes (Signed)
Subjective:   Paige Pena is a 81 y.o. female who presents for Medicare Annual (Subsequent) preventive examination.  Virtual Visit via Telephone Note  I connected with  Paige Pena on 07/11/21 at  8:30 AM EDT by telephone and verified that I am speaking with the correct person using two identifiers.  Location: Patient: home Provider: office Persons participating in the virtual visit: patient/Nurse Health Advisor   I discussed the limitations, risks, security and privacy concerns of performing an evaluation and management service by telephone and the availability of in person appointments. The patient expressed understanding and agreed to proceed.  Interactive audio and video telecommunications were attempted between this nurse and patient, however failed, due to patient having technical difficulties OR patient did not have access to video capability.  We continued and completed visit with audio only.  Some vital signs may be absent or patient reported.   Clemetine Marker, LPN   Review of Systems     Cardiac Risk Factors include: advanced age (>57mn, >>14women);dyslipidemia;sedentary lifestyle;family history of premature cardiovascular disease     Objective:    There were no vitals filed for this visit. There is no height or weight on file to calculate BMI.     07/11/2021    9:09 AM 04/09/2021   12:44 AM 11/07/2020    1:57 PM 01/07/2018    1:42 PM 11/30/2017    2:04 PM 12/31/2016    1:46 PM 05/07/2015    9:41 PM  Advanced Directives  Does Patient Have a Medical Advance Directive? No No No No No Yes No  Does patient want to make changes to medical advance directive?      Yes (MAU/Ambulatory/Procedural Areas - Information given)   Would patient like information on creating a medical advance directive? Yes (MAU/Ambulatory/Procedural Areas - Information given) Yes (ED - Information included in AVS) No - Patient declined Yes (MAU/Ambulatory/Procedural Areas - Information given)  No - Patient declined      Current Medications (verified) Outpatient Encounter Medications as of 07/11/2021  Medication Sig   aspirin 325 MG EC tablet Take 325 mg by mouth daily.   Biotin 1000 MCG tablet Take 1,000 mcg by mouth 3 (three) times daily.   busPIRone (BUSPAR) 5 MG tablet Take 1 tablet (5 mg total) by mouth 2 (two) times daily.   Cholecalciferol (VITAMIN D) 50 MCG (2000 UT) CAPS Take by mouth.   COLLAGEN-VITAMIN C PO Take by mouth.   DULoxetine (CYMBALTA) 60 MG capsule TAKE 1 CAPSULE EVERY DAY   fluticasone (FLONASE) 50 MCG/ACT nasal spray Place 2 sprays into both nostrils daily as needed for allergies.   Omega-3 Fatty Acids (FISH OIL) 1000 MG CAPS Take by mouth.   omeprazole (PRILOSEC) 40 MG capsule TAKE 1 CAPSULE(40 MG) BY MOUTH TWICE DAILY   prednisoLONE acetate (PRED FORTE) 1 % ophthalmic suspension Place 1 drop into both eyes as directed.   UNABLE TO FIND Med Name: Domperidone 40 mg 3x daily  ( help with digestion) (Patient not taking: Reported on 07/11/2021)   [DISCONTINUED] Calcium-Magnesium-Vitamin D (CALCIUM 1200+D3 PO) Take 1 tablet 2 (two) times daily by mouth.   [DISCONTINUED] Vitamin D, Ergocalciferol, (DRISDOL) 1.25 MG (50000 UNIT) CAPS capsule Take 1 capsule (50,000 Units total) by mouth every 7 (seven) days.   No facility-administered encounter medications on file as of 07/11/2021.    Allergies (verified) Morphine, Metoclopramide hcl, Aricept [donepezil hcl], Doxycycline, Penicillins, Prednisone, and Sulfonamide derivatives   History: Past Medical History:  Diagnosis Date  Acute cystitis    Anxiety    Aphasia    transsient aphasia Jan 2012   Asthmatic bronchitis    Cerebrovascular disease    Chest pain, atypical    Diverticulosis of colon    DJD (degenerative joint disease)    Dysphonia    Esophageal stricture    Fibromyalgia    Gastroparesis    GERD (gastroesophageal reflux disease)    Hematuria, microscopic    Hemorrhoids    Hypercholesteremia     IBS (irritable bowel syndrome)    Memory deficits 02/15/2013   Memory loss    RLS (restless legs syndrome)    TIA (transient ischemic attack)    Urinary incontinence    Vitamin D deficiency    Past Surgical History:  Procedure Laterality Date   ABDOMINAL HYSTERECTOMY  1995   corneal transplant for keratonconus     EYE SURGERY  2013   left foot fracture with ORIF  07/2004   Dr. Sharol Given   right bunion surgery     Family History  Problem Relation Age of Onset   Hyperlipidemia Sister    Diabetes Brother    Cancer Brother        lung   Hypertension Mother    Anemia Mother    Dementia Mother    Kidney disease Father    Breast cancer Other        aunt   Diabetes Other        aunt,brother,uncle   Kidney disease Other        father   Dementia Maternal Grandmother    Social History   Socioeconomic History   Marital status: Married    Spouse name: Mallie Mussel   Number of children: 3   Years of education: college-2   Highest education level: Not on file  Occupational History    Employer: RETIRED  Tobacco Use   Smoking status: Never   Smokeless tobacco: Never  Vaping Use   Vaping Use: Never used  Substance and Sexual Activity   Alcohol use: Yes    Comment: occ   Drug use: No   Sexual activity: Not on file  Other Topics Concern   Not on file  Social History Narrative   Lennie Hummer   [place], daughter   Pluma Diniz info release form to share her medical records with her children   Lives at home w/ her husband   Right-handed   Caffeine: tea on occasion   Social Determinants of Health   Financial Resource Strain: Low Risk    Difficulty of Paying Living Expenses: Not hard at all  Food Insecurity: No Food Insecurity   Worried About Charity fundraiser in the Last Year: Never true   Arboriculturist in the Last Year: Never true  Transportation Needs: No Transportation Needs   Lack of Transportation (Medical): No   Lack of  Transportation (Non-Medical): No  Physical Activity: Inactive   Days of Exercise per Week: 0 days   Minutes of Exercise per Session: 0 min  Stress: Stress Concern Present   Feeling of Stress : Rather much  Social Connections: Moderately Integrated   Frequency of Communication with Friends and Family: More than three times a week   Frequency of Social Gatherings with Friends and Family: Once a week   Attends Religious Services: More than 4 times per year   Active Member of [employer] or Organizations: No   Attends Archivist Meetings: Never   Marital Status:  Married    Tobacco Counseling Counseling given: Not Answered   Clinical Intake:  Pre-visit preparation completed: Yes  Pain : No/denies pain     Nutritional Risks: None Diabetes: No  How often do you need to have someone help you when you read instructions, pamphlets, or other written materials from your doctor or pharmacy?: 1 - Never    Interpreter Needed?: No  Information entered by :: Clemetine Marker LPN   Activities of Daily Living    07/11/2021    9:12 AM  In your present state of health, do you have any difficulty performing the following activities:  Hearing? 1  Vision? 0  Difficulty concentrating or making decisions? 1  Walking or climbing stairs? 0  Dressing or bathing? 0  Doing errands, shopping? 0  Preparing Food and eating ? N  Using the Toilet? N  In the past six months, have you accidently leaked urine? Y  Comment wears pads for protection  Do you have problems with loss of bowel control? N  Managing your Medications? N  Managing your Finances? N  Housekeeping or managing your Housekeeping? N    Patient Care Team: [name], MD as PCP - General (Family Medicine) Druscilla Brownie, MD as Consulting Physician (Dermatology) Marilynne Halsted, MD as Referring Physician (Ophthalmology) Ward Givens, NP as Nurse Practitioner (Neurology) Robley Fries, MD as Consulting  Physician (Urology)  Indicate any recent Medical Services you may have received from other than [facility] providers in the past year (date may be approximate).     Assessment:   This is a routine wellness examination for Paige Pena.  Hearing/Vision screen Hearing Screening - Comments:: Pt c/o mild hearing difficulty due to possible wax build up  Vision Screening - Comments:: Annual vision screenings done by  Dr. Darlys Gales at [facility] issues and exercise activities discussed: Current Exercise Habits: The patient does not participate in regular exercise at present, Exercise limited by: neurologic condition(s)   Goals Addressed             This Visit's Progress    Increase physical activity   Not on track    Increase activity, in gym 2 x week.         Depression Screen    07/11/2021    8:59 AM 04/26/2021    2:13 PM 01/31/2021    2:18 PM 11/07/2020    1:39 PM 06/13/2020    8:18 AM 01/24/2019    3:03 PM 01/24/2019    3:02 PM  PHQ 2/9 Scores  PHQ - 2 Score '4 4 1 2 2 '$ 0 1  PHQ- 9 Score '16 15 7 6 9 2 3    '$ Fall Risk    07/11/2021    9:12 AM 04/26/2021    2:14 PM 01/31/2021    2:17 PM 11/07/2020    1:56 PM 06/13/2020    8:18 AM  Fall Risk   Falls in the past year? '1 1 1 1 '$ 0  Number falls in past yr: 0 0 0 0 0  Comment    stumbled on stairs   Injury with Fall? '1 1 1 '$ 0 0  Risk for fall due to : History of fall(s) History of fall(s) History of fall(s) History of fall(s) No Fall Risks  Follow up Falls prevention discussed Falls evaluation completed Falls evaluation completed Education provided;Falls prevention discussed     FALL RISK PREVENTION PERTAINING TO THE HOME:  Any stairs in or around the home? Yes  If  so, are there any without handrails? No  Home free of loose throw rugs in walkways, pet beds, electrical cords, etc? Yes  Adequate lighting in your home to reduce risk of falls? Yes   ASSISTIVE DEVICES UTILIZED TO PREVENT FALLS:  Life alert? No  Use of a cane,  walker or w/c? No  Grab bars in the bathroom? Yes  Shower chair or bench in shower? No  Elevated toilet seat or a handicapped toilet? No   TIMED UP AND GO:  Was the test performed? No . Telephonic visit  Cognitive Function: Normal cognitive status assessed by direct observation by this Nurse Health Advisor. No abnormalities found. Normal MMSE completed 11/21/20. Pt does c/o occasional brain fog due to fibromyalgia.        11/21/2020    1:59 PM 01/07/2018    1:44 PM 05/27/2017   11:04 AM 11/25/2016    9:38 AM 05/22/2016    1:59 PM  MMSE - Mini Mental State Exam  Orientation to time '5 5 5 5 4  '$ Orientation to Place '5 5 5 4 5  '$ Registration '3 3 3 3 3  '$ Attention/ Calculation '5 5 2 5 3  '$ Recall '3 2 3 3 3  '$ Language- name 2 objects '2 2 2 2 1  '$ Language- repeat '1 1 1 1 1  '$ Language- follow 3 step command '3 3 2 3 3  '$ Language- read & follow direction '1 1 1 1 1  '$ Write a sentence '1 1 1 1 1  '$ Copy design '1 1 1 1 1  '$ Total score '30 29 26 29 26        '$ Immunizations Immunization History  Administered Date(s) Administered   Fluad Quad(high Dose 65+) 12/01/2018   Influenza Split 11/24/2010, 11/11/2011   Influenza Whole 12/25/2008, 01/15/2010   Influenza, High Dose Seasonal PF 11/19/2015, 12/07/2017   Influenza,inj,Quad PF,6+ Mos 04/04/2014   Influenza-Unspecified 12/26/2014, 12/22/2016   PFIZER(Purple Top)SARS-COV-2 Vaccination 03/15/2019, 04/05/2019, 11/25/2019   Pfizer Covid-19 Vaccine Bivalent Booster 71yr & up 06/04/2021   Pneumococcal Conjugate-13 10/05/2014   Pneumococcal Polysaccharide-23 12/31/2016   Zoster, Live 05/09/2011    TDAP status: Due, Education has been provided regarding the importance of this vaccine. Advised may receive this vaccine at local pharmacy or Health Dept. Aware to provide a copy of the vaccination record if obtained from local pharmacy or Health Dept. Verbalized acceptance and understanding.  Flu vaccine status: due Fall 2023  Pneumococcal vaccine status: Up  to date  Covid-19 vaccine status: Completed vaccines  Qualifies for Shingles Vaccine? Yes   Zostavax completed: YES Shingrix Completed?: No.    Education has been provided regarding the importance of this vaccine. Patient has been advised to call insurance company to determine out of pocket expense if they have not yet received this vaccine. Advised may also receive vaccine at local pharmacy or Health Dept. Verbalized acceptance and understanding.  Screening Tests Health Maintenance  Topic Date Due   TETANUS/TDAP  Never done   Zoster Vaccines- Shingrix (1 of 2) Never done   DEXA SCAN  07/10/2021   INFLUENZA VACCINE  09/24/2021   MAMMOGRAM  03/06/2022   Pneumonia Vaccine 81 Years old  Completed   COVID-19 Vaccine  Completed   HPV VACCINES  Aged Out    Health Maintenance  Health Maintenance Due  Topic Date Due   TETANUS/TDAP  Never done   Zoster Vaccines- Shingrix (1 of 2) Never done   DEXA SCAN  07/10/2021    Colorectal cancer screening: No  longer required.   Mammogram status: Completed 03/06/21. Repeat every year  Bone Density status: Completed 07/11/19. Results reflect: Bone density results: OSTEOPENIA. Repeat every 2 years. Pt to discuss at physical on 07/25/21. She is no longer taking calcium, only a vitamin D supplement.   Lung Cancer Screening: (Low Dose CT Chest recommended if Age 58-80 years, 30 pack-year currently smoking OR have quit w/in 15years.) does not qualify.   Additional Screening:  Hepatitis C Screening: does not qualify  Vision Screening: Recommended annual ophthalmology exams for early detection of glaucoma and other disorders of the eye. Is the patient up to date with their annual eye exam?  Yes  Who is the provider or what is the name of the office in which the patient attends annual eye exams? Dr. Darlys Gales.   Dental Screening: Recommended annual dental exams for proper oral hygiene  Community Resource Referral / Chronic Care Management: CRR  required this visit?  No   CCM required this visit?  No      Plan:     I have personally reviewed and noted the following in the patient's chart:   Medical and social history Use of alcohol, tobacco or illicit drugs  Current medications and supplements including opioid prescriptions.  Functional ability and status Nutritional status Physical activity Advanced directives List of other physicians Hospitalizations, surgeries, and ER visits in previous 12 months Vitals Screenings to include cognitive, depression, and falls Referrals and appointments  In addition, I have reviewed and discussed with patient certain preventive protocols, quality metrics, and best practice recommendations. A written personalized care plan for preventive services as well as general preventive health recommendations were provided to patient.     Clemetine Marker, LPN   [id number]/62/[id number]   Nurse Notes: pt states she has not heard anything regarding physical therapy appt or counseling appt after last office visit with Dr. [name] on 04/26/21. Pt is still experiencing low back discomfort and numbness in left foot. Pt states she has been trying to get an appt with neurology but unable to at this time.   PHQ9 today = 16. Pt feels fatigued and "not up to par" but participating virtually with church activities and currently visiting her daughter in [place].

## 2021-07-11 NOTE — Patient Instructions (Signed)
Paige Pena , Thank you for taking time to come for your Medicare Wellness Visit. I appreciate your ongoing commitment to your health goals. Please review the following plan we discussed and let me know if I can assist you in the future.   Screening recommendations/referrals: Colonoscopy: no longer required Mammogram: done 03/06/21 Bone Density: done 07/11/19. Please discuss repeat screening and calcium recommendation at your next appointment.  Recommended yearly ophthalmology/optometry visit for glaucoma screening and checkup Recommended yearly dental visit for hygiene and checkup  Vaccinations: Influenza vaccine: due fall 2023 Pneumococcal vaccine: done 12/31/16 Tdap vaccine: due Shingles vaccine: please bring a copy of your vaccine record to your next appointment   Covid-19:done 1/19/212/9/21, 11/25/19 & 06/04/21  Advanced directives: Advance directive discussed with you today. Please request a copy at your next office visit for you to complete at home and have notarized. Once this is complete please bring a copy in to our office so we can scan it into your chart.   Conditions/risks identified: Recommend increasing activity as tolerated  Next appointment: Follow up in one year for your annual wellness visit    Preventive Care 65 Years and Older, Female Preventive care refers to lifestyle choices and visits with your health care provider that can promote health and wellness. What does preventive care include? A yearly physical exam. This is also called an annual well check. Dental exams once or twice a year. Routine eye exams. Ask your health care provider how often you should have your eyes checked. Personal lifestyle choices, including: Daily care of your teeth and gums. Regular physical activity. Eating a healthy diet. Avoiding tobacco and drug use. Limiting alcohol use. Practicing safe sex. Taking low-dose aspirin every day. Taking vitamin and mineral supplements as  recommended by your health care provider. What happens during an annual well check? The services and screenings done by your health care provider during your annual well check will depend on your age, overall health, lifestyle risk factors, and family history of disease. Counseling  Your health care provider may ask you questions about your: Alcohol use. Tobacco use. Drug use. Emotional well-being. Home and relationship well-being. Sexual activity. Eating habits. History of falls. Memory and ability to understand (cognition). Work and work Statistician. Reproductive health. Screening  You may have the following tests or measurements: Height, weight, and BMI. Blood pressure. Lipid and cholesterol levels. These may be checked every 5 years, or more frequently if you are over 24 years old. Skin check. Lung cancer screening. You may have this screening every year starting at age 82 if you have a 30-pack-year history of smoking and currently smoke or have quit within the past 15 years. Fecal occult blood test (FOBT) of the stool. You may have this test every year starting at age 82. Flexible sigmoidoscopy or colonoscopy. You may have a sigmoidoscopy every 5 years or a colonoscopy every 10 years starting at age 82. Hepatitis C blood test. Hepatitis B blood test. Sexually transmitted disease (STD) testing. Diabetes screening. This is done by checking your blood sugar (glucose) after you have not eaten for a while (fasting). You may have this done every 1-3 years. Bone density scan. This is done to screen for osteoporosis. You may have this done starting at age 82. Mammogram. This may be done every 1-2 years. Talk to your health care provider about how often you should have regular mammograms. Talk with your health care provider about your test results, treatment options, and if necessary, the need for  more tests. Vaccines  Your health care provider may recommend certain vaccines, such  as: Influenza vaccine. This is recommended every year. Tetanus, diphtheria, and acellular pertussis (Tdap, Td) vaccine. You may need a Td booster every 10 years. Zoster vaccine. You may need this after age 82. Pneumococcal 13-valent conjugate (PCV13) vaccine. One dose is recommended after age 82. Pneumococcal polysaccharide (PPSV23) vaccine. One dose is recommended after age 82. Talk to your health care provider about which screenings and vaccines you need and how often you need them. This information is not intended to replace advice given to you by your health care provider. Make sure you discuss any questions you have with your health care provider. Document Released: 03/09/2015 Document Revised: 10/31/2015 Document Reviewed: 12/12/2014 Elsevier Interactive Patient Education  2017 [place] Prevention in the Home Falls can cause injuries. They can happen to people of all ages. There are many things you can do to make your home safe and to help prevent falls. What can I do on the outside of my home? Regularly fix the edges of walkways and driveways and fix any cracks. Remove anything that might make you trip as you walk through a door, such as a raised step or threshold. Trim any bushes or trees on the path to your home. Use bright outdoor lighting. Clear any walking paths of anything that might make someone trip, such as rocks or tools. Regularly check to see if handrails are loose or broken. Make sure that both sides of any steps have handrails. Any raised decks and porches should have guardrails on the edges. Have any leaves, snow, or ice cleared regularly. Use sand or salt on walking paths during winter. Clean up any spills in your garage right away. This includes oil or grease spills. What can I do in the bathroom? Use night lights. Install grab bars by the toilet and in the tub and shower. Do not use towel bars as grab bars. Use non-skid mats or decals in the tub or  shower. If you need to sit down in the shower, use a plastic, non-slip stool. Keep the floor dry. Clean up any water that spills on the floor as soon as it happens. Remove soap buildup in the tub or shower regularly. Attach bath mats securely with double-sided non-slip rug tape. Do not have throw rugs and other things on the floor that can make you trip. What can I do in the bedroom? Use night lights. Make sure that you have a light by your bed that is easy to reach. Do not use any sheets or blankets that are too big for your bed. They should not hang down onto the floor. Have a firm chair that has side arms. You can use this for support while you get dressed. Do not have throw rugs and other things on the floor that can make you trip. What can I do in the kitchen? Clean up any spills right away. Avoid walking on wet floors. Keep items that you use a lot in easy-to-reach places. If you need to reach something above you, use a strong step stool that has a grab bar. Keep electrical cords out of the way. Do not use floor polish or wax that makes floors slippery. If you must use wax, use non-skid floor wax. Do not have throw rugs and other things on the floor that can make you trip. What can I do with my stairs? Do not leave any items on the stairs. Make  sure that there are handrails on both sides of the stairs and use them. Fix handrails that are broken or loose. Make sure that handrails are as long as the stairways. Check any carpeting to make sure that it is firmly attached to the stairs. Fix any carpet that is loose or worn. Avoid having throw rugs at the top or bottom of the stairs. If you do have throw rugs, attach them to the floor with carpet tape. Make sure that you have a light switch at the top of the stairs and the bottom of the stairs. If you do not have them, ask someone to add them for you. What else can I do to help prevent falls? Wear shoes that: Do not have high heels. Have  rubber bottoms. Are comfortable and fit you well. Are closed at the toe. Do not wear sandals. If you use a stepladder: Make sure that it is fully opened. Do not climb a closed stepladder. Make sure that both sides of the stepladder are locked into place. Ask someone to hold it for you, if possible. Clearly mark and make sure that you can see: Any grab bars or handrails. First and last steps. Where the edge of each step is. Use tools that help you move around (mobility aids) if they are needed. These include: Canes. Walkers. Scooters. Crutches. Turn on the lights when you go into a dark area. Replace any light bulbs as soon as they burn out. Set up your furniture so you have a clear path. Avoid moving your furniture around. If any of your floors are uneven, fix them. If there are any pets around you, be aware of where they are. Review your medicines with your doctor. Some medicines can make you feel dizzy. This can increase your chance of falling. Ask your doctor what other things that you can do to help prevent falls. This information is not intended to replace advice given to you by your health care provider. Make sure you discuss any questions you have with your health care provider. Document Released: 12/07/2008 Document Revised: 07/19/2015 Document Reviewed: 03/17/2014 Elsevier Interactive Patient Education  2017 [employer].

## 2021-07-16 ENCOUNTER — Other Ambulatory Visit: Payer: Self-pay | Admitting: Family Medicine

## 2021-07-25 ENCOUNTER — Ambulatory Visit (INDEPENDENT_AMBULATORY_CARE_PROVIDER_SITE_OTHER): Payer: Medicare HMO | Admitting: Family Medicine

## 2021-07-25 ENCOUNTER — Other Ambulatory Visit: Payer: Self-pay

## 2021-07-25 ENCOUNTER — Telehealth: Payer: Self-pay | Admitting: Family Medicine

## 2021-07-25 ENCOUNTER — Encounter: Payer: Medicare HMO | Admitting: Family Medicine

## 2021-07-25 ENCOUNTER — Encounter: Payer: Self-pay | Admitting: Family Medicine

## 2021-07-25 VITALS — BP 122/62 | HR 75 | Temp 97.7°F | Resp 18 | Ht 61.0 in | Wt 117.6 lb

## 2021-07-25 DIAGNOSIS — R202 Paresthesia of skin: Secondary | ICD-10-CM | POA: Diagnosis not present

## 2021-07-25 DIAGNOSIS — R200 Anesthesia of skin: Secondary | ICD-10-CM

## 2021-07-25 DIAGNOSIS — M8580 Other specified disorders of bone density and structure, unspecified site: Secondary | ICD-10-CM | POA: Diagnosis not present

## 2021-07-25 DIAGNOSIS — E785 Hyperlipidemia, unspecified: Secondary | ICD-10-CM

## 2021-07-25 DIAGNOSIS — E559 Vitamin D deficiency, unspecified: Secondary | ICD-10-CM | POA: Diagnosis not present

## 2021-07-25 DIAGNOSIS — Z0000 Encounter for general adult medical examination without abnormal findings: Secondary | ICD-10-CM | POA: Diagnosis not present

## 2021-07-25 DIAGNOSIS — G9332 Myalgic encephalomyelitis/chronic fatigue syndrome: Secondary | ICD-10-CM | POA: Diagnosis not present

## 2021-07-25 NOTE — Telephone Encounter (Signed)
Caller states she received a call from the office.    Please advise

## 2021-07-25 NOTE — Patient Instructions (Addendum)
Follow up in 6 months to recheck anxiety- sooner if needed We'll notify you of your lab results and make any changes if needed If the trouble swallowing gets worse- let me know so we can get you in with GI We'll call you to schedule your bone density appt If you don't hear from therapy/psychology- let me know Restart the Buspirone twice daily and see if the anxiety improves Call with any questions or concerns Hang in there!!

## 2021-07-25 NOTE — Progress Notes (Signed)
   Subjective:    Patient ID: TANITH DAGOSTINO, female    DOB: March 30, 1940, 81 y.o.   MRN: [id number]  HPI CPE- Due for repeat DEXA.  UTD on mammo, PNA.  Patient Care Team    Relationship Specialty Notifications Start End  [name], MD PCP - General Family Medicine  07/25/13    Comment: Jolyne Loa, MD Consulting Physician Dermatology  12/31/16   Marilynne Halsted, MD Referring Physician Ophthalmology  12/31/16   Ward Givens, NP Nurse Practitioner Neurology  07/11/21   Robley Fries, MD Consulting Physician Urology  07/11/21     Health Maintenance  Topic Date Due   Zoster Vaccines- Shingrix (1 of 2) Never done   DEXA SCAN  07/10/2021   TETANUS/TDAP  07/26/2022 (Originally 05/26/1959)   INFLUENZA VACCINE  09/24/2021   MAMMOGRAM  03/06/2022   Pneumonia Vaccine 74+ Years old  Completed   COVID-19 Vaccine  Completed   HPV VACCINES  Aged Out      Review of Systems Patient reports no vision/ hearing changes, adenopathy,fever, weight change,  persistant/recurrent hoarseness, chest pain, palpitations, edema, persistant/recurrent cough, hemoptysis, gastrointestinal bleeding (melena, rectal bleeding), abdominal pain, significant heartburn, bowel changes, GU symptoms (dysuria, hematuria, incontinence), Gyn symptoms (abnormal  bleeding, pain),  syncope, focal weakness, memory loss, skin/hair/nail changes, abnormal bruising or bleeding.   + dysphagia- particularly w/ pills. Musculoskeletal CP- pt was pushing a table and 'it felt like something popped out'. + 'feels like I can't get a good deep breath'. + numbness/tingling of hands/feet + anxiety     Objective:   Physical Exam General Appearance:    Alert, cooperative, no distress, appears stated age  Head:    Normocephalic, without obvious abnormality, atraumatic  Eyes:    PERRL, conjunctiva/corneas clear, EOM's intact, fundi    benign, both eyes  Ears:    Normal TM's and external ear canals, both ears  Nose:    Nares normal, septum midline, mucosa normal, no drainage    or sinus tenderness  Throat:   Lips, mucosa, and tongue normal; teeth and gums normal  Neck:   Supple, symmetrical, trachea midline, no adenopathy;    Thyroid: no enlargement/tenderness/nodules  Back:     Symmetric, no curvature, ROM normal, no CVA tenderness  Lungs:     Clear to auscultation bilaterally, respirations unlabored  Chest Wall:    + TTP over lower sternum w/o obvious deformity   Heart:    Regular rate and rhythm, S1 and S2 normal, no murmur, rub   or gallop  Breast Exam:    Deferred  Abdomen:     Soft, non-tender, bowel sounds active all four quadrants,    no masses, no organomegaly  Genitalia:    Deferred  Rectal:    Extremities:   Extremities normal, atraumatic, no cyanosis or edema  Pulses:   2+ and symmetric all extremities  Skin:   Skin color, texture, turgor normal, no rashes or lesions  Lymph nodes:   Cervical, supraclavicular, and axillary nodes normal  Neurologic:   CNII-XII intact, normal strength, sensation and reflexes    throughout          Assessment & Plan:

## 2021-07-25 NOTE — Telephone Encounter (Signed)
No notation of call made by staff, likely reminder call for appt today, will close.

## 2021-07-26 ENCOUNTER — Other Ambulatory Visit: Payer: Self-pay | Admitting: Family Medicine

## 2021-07-26 ENCOUNTER — Telehealth: Payer: Self-pay | Admitting: Family Medicine

## 2021-07-26 DIAGNOSIS — K219 Gastro-esophageal reflux disease without esophagitis: Secondary | ICD-10-CM

## 2021-07-26 LAB — CBC WITH DIFFERENTIAL/PLATELET
Basophils Absolute: 0.1 10*3/uL (ref 0.0–0.1)
Basophils Relative: 1.3 % (ref 0.0–3.0)
Eosinophils Absolute: 0 10*3/uL (ref 0.0–0.7)
Eosinophils Relative: 0.9 % (ref 0.0–5.0)
HCT: 39.7 % (ref 36.0–46.0)
Hemoglobin: 13.2 g/dL (ref 12.0–15.0)
Lymphocytes Relative: 21.9 % (ref 12.0–46.0)
Lymphs Abs: 1 10*3/uL (ref 0.7–4.0)
MCHC: 33.3 g/dL (ref 30.0–36.0)
MCV: 90.6 fl (ref 78.0–100.0)
Monocytes Absolute: 0.3 10*3/uL (ref 0.1–1.0)
Monocytes Relative: 6.5 % (ref 3.0–12.0)
Neutro Abs: 3.2 10*3/uL (ref 1.4–7.7)
Neutrophils Relative %: 69.4 % (ref 43.0–77.0)
Platelets: 158 10*3/uL (ref 150.0–400.0)
RBC: 4.38 Mil/uL (ref 3.87–5.11)
RDW: 13.2 % (ref 11.5–15.5)
WBC: 4.6 10*3/uL (ref 4.0–10.5)

## 2021-07-26 LAB — HEPATIC FUNCTION PANEL
ALT: 11 U/L (ref 0–35)
AST: 21 U/L (ref 0–37)
Albumin: 4.1 g/dL (ref 3.5–5.2)
Alkaline Phosphatase: 83 U/L (ref 39–117)
Bilirubin, Direct: 0.1 mg/dL (ref 0.0–0.3)
Total Bilirubin: 0.5 mg/dL (ref 0.2–1.2)
Total Protein: 7 g/dL (ref 6.0–8.3)

## 2021-07-26 LAB — B12 AND FOLATE PANEL
Folate: 14.3 ng/mL (ref >5.9)
Vitamin B-12: 516 pg/mL (ref 211–911)

## 2021-07-26 LAB — BASIC METABOLIC PANEL
BUN: 20 mg/dL (ref 6–23)
CO2: 30 mEq/L (ref 19–32)
Calcium: 9.4 mg/dL (ref 8.4–10.5)
Chloride: 102 mEq/L (ref 96–112)
Creatinine, Ser: 0.79 mg/dL (ref 0.40–1.20)
GFR: 69.78 mL/min (ref >60.00)
Glucose, Bld: 83 mg/dL (ref 70–99)
Potassium: 3.8 mEq/L (ref 3.5–5.1)
Sodium: 139 mEq/L (ref 135–145)

## 2021-07-26 LAB — TSH: TSH: 1.3 u[IU]/mL (ref 0.35–5.50)

## 2021-07-26 LAB — LIPID PANEL
Cholesterol: 228 mg/dL — ABNORMAL HIGH (ref 0–200)
HDL: 78.1 mg/dL (ref >39.00)
LDL Cholesterol: 137 mg/dL — ABNORMAL HIGH (ref 0–99)
NonHDL: 150.29
Total CHOL/HDL Ratio: 3
Triglycerides: 65 mg/dL (ref 0.0–149.0)
VLDL: 13 mg/dL (ref 0.0–40.0)

## 2021-07-26 LAB — VITAMIN D 25 HYDROXY (VIT D DEFICIENCY, FRACTURES): VITD: 35.18 ng/mL (ref 30.00–100.00)

## 2021-07-26 LAB — IRON,TIBC AND FERRITIN PANEL
%SAT: 24 % (calc) (ref 16–45)
Ferritin: 12 ng/mL — ABNORMAL LOW (ref 16–288)
Iron: 92 ug/dL (ref 45–160)
TIBC: 383 mcg/dL (calc) (ref 250–450)

## 2021-07-26 NOTE — Telephone Encounter (Signed)
Pt stating she still waiting call back about her medication.

## 2021-07-29 ENCOUNTER — Telehealth: Payer: Self-pay

## 2021-07-29 ENCOUNTER — Encounter: Payer: Self-pay | Admitting: Family Medicine

## 2021-07-29 ENCOUNTER — Telehealth (INDEPENDENT_AMBULATORY_CARE_PROVIDER_SITE_OTHER): Payer: Medicare HMO | Admitting: Family Medicine

## 2021-07-29 DIAGNOSIS — R269 Unspecified abnormalities of gait and mobility: Secondary | ICD-10-CM | POA: Diagnosis not present

## 2021-07-29 DIAGNOSIS — R251 Tremor, unspecified: Secondary | ICD-10-CM | POA: Diagnosis not present

## 2021-07-29 DIAGNOSIS — R911 Solitary pulmonary nodule: Secondary | ICD-10-CM

## 2021-07-29 DIAGNOSIS — F419 Anxiety disorder, unspecified: Secondary | ICD-10-CM | POA: Diagnosis not present

## 2021-07-29 DIAGNOSIS — R0789 Other chest pain: Secondary | ICD-10-CM

## 2021-07-29 DIAGNOSIS — F32A Depression, unspecified: Secondary | ICD-10-CM | POA: Diagnosis not present

## 2021-07-29 NOTE — Progress Notes (Signed)
Virtual Visit via Video   I connected with patient on 07/29/21 at  1:40 PM EDT by a video enabled telemedicine application and verified that I am speaking with the correct person using two identifiers.  Location patient: Home Location provider: [facility], Office Persons participating in the virtual visit: Patient, Provider, [place] Marcille Blanco C)  I discussed the limitations of evaluation and management by telemedicine and the availability of in person appointments. The patient expressed understanding and agreed to proceed.  Subjective:   HPI:   Follow up questions.  Pt's daughter, Santiago Glad from [place], is on the phone for this visit  CT results- pt had CT done w/ Urology that showed isolated 58m in the periphery of L lower lobe.  The read says that no f/u is needed but family is very adamant that she see a pulmonologist regarding this issue.  Chest discomfort- pt reports feeling a 'pop' on her chest when pushing a table.  We discussed that this is likely cartilaginous.  This explanation is not acceptable to daughter and wants to know what comes next.  Listing to one side- pt feels that she is leaning/listing to one side when walking  Tremor- daughter wants this worked up as well.  Pt has appt upcoming w/ neurology next month.  Depression- daughter wants pt to see psychiatry to manage meds.  ROS:   See pertinent positives and negatives per HPI.  Patient Active Problem List   Diagnosis Date Noted   Osteopenia 07/25/2021   Throat irritation 11/28/2019   HLD (hyperlipidemia)    Overactive bladder 05/08/2015   Aphasia    Cervical spondylitis (HCC)    Numbness and tingling 03/21/2015   TIA (transient ischemic attack) 03/21/2015   Physical exam 10/05/2014   Abdominal pain 05/17/2014   Allergic rhinitis 05/17/2014   Fatigue 04/04/2014   Anxiety and depression 07/25/2013   Weight loss 07/25/2013   Memory deficits 02/15/2013   Lumbar spondylosis 09/21/2012   Tachycardia  09/25/2011   Palpitations 09/16/2010   Dysphonia 09/05/2010   Slowing of urinary stream 09/25/2009   Cerebrovascular disease 04/03/2008   DYSPHONIA 04/03/2008   Diverticulosis of large intestine 04/02/2008   Irritable bowel syndrome 04/02/2008   DEGENERATIVE JOINT DISEASE 04/02/2008   HEMATURIA, MICROSCOPIC, HX OF 04/02/2008   Vitamin D deficiency 03/28/2008   Fibromyalgia 03/28/2008   URINARY INCONTINENCE 03/28/2008   GERD 08/02/2007   Gastroparesis 08/02/2007   HEMORRHOIDS 07/30/2007   ESOPHAGEAL STRICTURE 07/30/2007   Anxiety state 07/30/2007    Social History   Tobacco Use   Smoking status: Never   Smokeless tobacco: Never  Substance Use Topics   Alcohol use: Yes    Comment: occ    Current Outpatient Medications:    aspirin 325 MG EC tablet, Take 325 mg by mouth daily., Disp: , Rfl:    Biotin 1000 MCG tablet, Take 1,000 mcg by mouth 3 (three) times daily., Disp: , Rfl:    busPIRone (BUSPAR) 5 MG tablet, TAKE 1 TABLET TWICE DAILY, Disp: 120 tablet, Rfl: 1   Cholecalciferol (VITAMIN D) 50 MCG (2000 UT) CAPS, Take by mouth., Disp: , Rfl:    COLLAGEN-VITAMIN C PO, Take by mouth., Disp: , Rfl:    DULoxetine (CYMBALTA) 60 MG capsule, TAKE 1 CAPSULE EVERY DAY, Disp: 90 capsule, Rfl: 0   fluticasone (FLONASE) 50 MCG/ACT nasal spray, Place 2 sprays into both nostrils daily as needed for allergies., Disp: 16 g, Rfl: 3   Omega-3 Fatty Acids (FISH OIL) 1000 MG CAPS, Take  by mouth., Disp: , Rfl:    omeprazole (PRILOSEC) 40 MG capsule, TAKE 1 CAPSULE TWICE DAILY, Disp: 180 capsule, Rfl: 0   prednisoLONE acetate (PRED FORTE) 1 % ophthalmic suspension, Place 1 drop into both eyes as directed., Disp: , Rfl:   Allergies  Allergen Reactions   Morphine Other (See Comments)    REACTION: lethargy/malaise   Metoclopramide Hcl Anxiety and Other (See Comments)    REACTION: anxious and out of her mind   Aricept [Donepezil Hcl]     nausea   Doxycycline Diarrhea   Penicillins Itching and  Rash    Has patient had a PCN reaction causing immediate rash, facial/tongue/throat swelling, SOB or lightheadedness with hypotension: No Has patient had a PCN reaction causing severe rash involving mucus membranes or skin necrosis: No Has patient had a PCN reaction that required hospitalization No Has patient had a PCN reaction occurring within the last 10 years: Yes If all of the above answers are "NO", then may proceed with Cephalosporin use.     Prednisone Itching and Rash   Sulfonamide Derivatives Itching and Rash    Objective:   There were no vitals taken for this visit. AAOx3, NAD NCAT No obvious CN deficits Coloring WNL Pt is able to speak clearly, coherently without shortness of breath or increased work of breathing.  Thought process is linear.  Mood is appropriate.   Assessment and Plan:   L lung nodule- new.  Noted on CT done by urology.  The CT report indicates that no follow up is needed but the daughter indicates that this is not acceptable and would like to pursue a work up.  Referral to pulmonary placed.  Musculoskeletal CP- pt notes that she was pushing a table and felt a 'pop' in her chest.  She reports this continues to be sore at times.  Despite reassurance, daughter wants pt to be treated for this.  Will start w/ PT referral.  Pt expressed understanding and is in agreement w/ plan.   Tremor/Abnormal gait- pt has upcoming appt w/ neuro.  Encouraged her to discuss both of these issues at that time  Anxiety and depression- daughter would like pt to see a psychiatrist to manage her anxiety/depression medication.  Referral placed.   Annye Asa, MD 07/29/2021

## 2021-07-29 NOTE — Telephone Encounter (Signed)
Spoke w/ the pt advised her of the lab results

## 2021-07-29 NOTE — Telephone Encounter (Signed)
-----   Message from [name], MD sent at 07/26/2021  3:33 PM EDT ----- Labs look great!  No changes at this time.  Your total cholesterol and LDL (bad cholesterol) have increased somewhat, but your ratio of good to bad cholesterol is still excellent.

## 2021-07-31 DIAGNOSIS — H02423 Myogenic ptosis of bilateral eyelids: Secondary | ICD-10-CM | POA: Diagnosis not present

## 2021-07-31 DIAGNOSIS — H2513 Age-related nuclear cataract, bilateral: Secondary | ICD-10-CM | POA: Diagnosis not present

## 2021-07-31 DIAGNOSIS — Z947 Corneal transplant status: Secondary | ICD-10-CM | POA: Diagnosis not present

## 2021-08-14 ENCOUNTER — Ambulatory Visit: Payer: Self-pay

## 2021-08-14 DIAGNOSIS — K219 Gastro-esophageal reflux disease without esophagitis: Secondary | ICD-10-CM

## 2021-08-14 DIAGNOSIS — E785 Hyperlipidemia, unspecified: Secondary | ICD-10-CM

## 2021-08-14 NOTE — Patient Instructions (Signed)
Visit Information  Thank you for allowing me to share the care management and care coordination services that are available to you as part of your health plan and services through your primary care provider and medical home. Please reach out to me at [phone] if the care management/care coordination team may be of assistance to you in the future.   Ayaz Sondgeroth RN, BSN,CCM, CDE Care Management Coordinator [facility] [phone]   

## 2021-08-14 NOTE — Chronic Care Management (AMB) (Signed)
  Care Management   Follow Up Note   08/14/2021 Name: Paige Pena MRN: [id number] DOB: 1940/10/03   Referred by: [name], MD Reason for referral : Chronic Care Management (Case closed unable to maintain contact)   Case closed unable to maintain contact  Follow Up Plan: No further follow up required: Case closed unable to maintain contact  [place], [facility], CDE Care Management Coordinator [facility] [phone]

## 2021-08-21 ENCOUNTER — Ambulatory Visit: Payer: Medicare HMO | Attending: Family Medicine

## 2021-08-21 DIAGNOSIS — R0789 Other chest pain: Secondary | ICD-10-CM | POA: Insufficient documentation

## 2021-08-21 DIAGNOSIS — M6281 Muscle weakness (generalized): Secondary | ICD-10-CM | POA: Insufficient documentation

## 2021-08-22 ENCOUNTER — Ambulatory Visit: Payer: Medicare HMO

## 2021-08-22 DIAGNOSIS — R0789 Other chest pain: Secondary | ICD-10-CM

## 2021-08-22 DIAGNOSIS — M6281 Muscle weakness (generalized): Secondary | ICD-10-CM | POA: Diagnosis not present

## 2021-08-22 DIAGNOSIS — J310 Chronic rhinitis: Secondary | ICD-10-CM | POA: Diagnosis not present

## 2021-08-22 DIAGNOSIS — K219 Gastro-esophageal reflux disease without esophagitis: Secondary | ICD-10-CM | POA: Diagnosis not present

## 2021-08-22 NOTE — Therapy (Signed)
OUTPATIENT PHYSICAL THERAPY SHOULDER EVALUATION   Patient Name: Paige Pena MRN: [id number] DOB:December 31, 1940, 81 y.o., female Today's Date: 08/22/2021   PT End of Session - 08/22/21 1017     Visit Number 1    Date for PT Re-Evaluation 09/26/21    Authorization Type [employer]    PT Start Time 1017    PT Stop Time 1105    PT Time Calculation (min) 48 min    Activity Tolerance Patient tolerated treatment well    Behavior During Therapy [facility] for tasks assessed/performed             Past Medical History:  Diagnosis Date   Acute cystitis    Anxiety    Aphasia    transsient aphasia Jan 2012   Asthmatic bronchitis    Cerebrovascular disease    Chest pain, atypical    Diverticulosis of colon    DJD (degenerative joint disease)    Dysphonia    Esophageal stricture    Fibromyalgia    Gastroparesis    GERD (gastroesophageal reflux disease)    Hematuria, microscopic    Hemorrhoids    Hypercholesteremia    IBS (irritable bowel syndrome)    Memory deficits 02/15/2013   Memory loss    RLS (restless legs syndrome)    TIA (transient ischemic attack)    Urinary incontinence    Vitamin D deficiency    Past Surgical History:  Procedure Laterality Date   ABDOMINAL HYSTERECTOMY  1995   corneal transplant for keratonconus     EYE SURGERY  2013   left foot fracture with ORIF  07/2004   Dr. Sharol Given   right bunion surgery     Patient Active Problem List   Diagnosis Date Noted   Osteopenia 07/25/2021   Throat irritation 11/28/2019   HLD (hyperlipidemia)    Overactive bladder 05/08/2015   Aphasia    Cervical spondylitis (HCC)    Numbness and tingling 03/21/2015   TIA (transient ischemic attack) 03/21/2015   Physical exam 10/05/2014   Abdominal pain 05/17/2014   Allergic rhinitis 05/17/2014   Fatigue 04/04/2014   Anxiety and depression 07/25/2013   Weight loss 07/25/2013   Memory deficits 02/15/2013   Lumbar spondylosis 09/21/2012   Tachycardia 09/25/2011    Palpitations 09/16/2010   Dysphonia 09/05/2010   Slowing of urinary stream 09/25/2009   Cerebrovascular disease 04/03/2008   DYSPHONIA 04/03/2008   Diverticulosis of large intestine 04/02/2008   Irritable bowel syndrome 04/02/2008   DEGENERATIVE JOINT DISEASE 04/02/2008   HEMATURIA, MICROSCOPIC, HX OF 04/02/2008   Vitamin D deficiency 03/28/2008   Fibromyalgia 03/28/2008   URINARY INCONTINENCE 03/28/2008   GERD 08/02/2007   Gastroparesis 08/02/2007   HEMORRHOIDS 07/30/2007   ESOPHAGEAL STRICTURE 07/30/2007   Anxiety state 07/30/2007    PCP: Annye Asa  REFERRING PROVIDER: Annye Asa  REFERRING DIAG: R07.89  THERAPY DIAG:  Other chest pain  Muscle weakness (generalized)  Rationale for Evaluation and Treatment Rehabilitation  ONSET DATE: 07/25/21  SUBJECTIVE:  SUBJECTIVE STATEMENT: I fell off a ladder back in February and am having side low back. I was pulling my kitchen table and a muscle in my chest and lower down has been hurting since. She points to her L pec muscle and low sternum near xiphoid process as pain markers. She states it feels like pressure and mostly happens at night.  PERTINENT HISTORY: Fibromyaglia, dysphonia, anxiety, depression, osteopenia  PAIN:  Are you having pain?  No pain but when the pain happens it is at night its about an 8/10 and it feels like a pressure in middle of sternum  PRECAUTIONS: Fall  WEIGHT BEARING RESTRICTIONS No  FALLS:  Has patient fallen in last 6 months? Yes. Number of falls 1 off a ladder  LIVING ENVIRONMENT: Lives with: lives with their spouse Lives in: House/apartment Stairs: No Has following equipment at home: None  OCCUPATION: retired  PLOF: Independent  PATIENT GOALS to get rid of pain   OBJECTIVE:   DIAGNOSTIC  FINDINGS:  Right Chest DG 2/14 IMPRESSION: 1. No displaced right rib fracture identified. 2. No acute cardiopulmonary abnormality.  PATIENT SURVEYS:  Quick Dash 29.5  COGNITION:  Overall cognitive status: Within functional limits for tasks assessed and History of cognitive impairments - at baseline (memory deficits)     SENSATION: WFL  POSTURE: Forward head, rounded shoulders  UPPER EXTREMITY ROM: WFL  MMT Right eval Left eval  Shoulder flexion (feels a pressure in her chest) 3+ 3+  Shoulder extension    Shoulder abduction 4 4  Shoulder adduction    Shoulder internal rotation 3 4  Shoulder external rotation 4 4  Elbow flexion 4 4  Elbow extension 4 4  Wrist flexion    Wrist extension    Wrist ulnar deviation    Wrist radial deviation    Wrist pronation    Wrist supination    (Blank rows = not tested)  Horizontal ABD 4/5 & Horizontal ADD 4/5 causes some pressure at xiphoid process  JOINT MOBILITY TESTING:  Full PROM for UE  PALPATION:  TTP on L pec major insertion and xiphoid    TODAY'S TREATMENT:  POC 6MWT- 17 laps   PATIENT EDUCATION: Education details: POC Person educated: Patient Education method: Explanation Education comprehension: verbalized understanding   HOME EXERCISE PROGRAM: Access Code: [email or web address]  Exercises - Single Arm Doorway Pec Stretch at 90 Degrees Abduction  - 2 x daily - 7 x weekly - 30 hold - Standing Shoulder Horizontal Abduction with Resistance  - 1 x daily - 7 x weekly - 3 sets - 10 reps  ASSESSMENT:  CLINICAL IMPRESSION: Patient is a 81 y.o. female who was seen today for physical therapy evaluation and treatment for possible pec strain and chest pain. She describes the pain as a pressure and it only happens at night. Her pain could be related to GERD; she also states that she sometimes has difficulty with taking breaths. She has good overall strength and ROM, we also completed a 6MWT to test her endurance. She is able to  complete 17 laps in our gym and reports just a mild chest pressure halfway through. We will try PT 1-2x/wk for 4 weeks to see if there are any improvements otherwise she will plan to see a cardiologist or PCP for chest images.   OBJECTIVE IMPAIRMENTS decreased strength and pain.   ACTIVITY LIMITATIONS carrying, lifting, sleeping, and pushing, pulling  PERSONAL FACTORS Age, Behavior pattern, Time since onset of injury/illness/exacerbation, and 3+ comorbidities: CVD, Fibromyalgia, GERD,  TIA  are also affecting patient's functional outcome.   REHAB POTENTIAL: Fair if her origin is [facility]  CLINICAL DECISION MAKING: Evolving/moderate complexity  EVALUATION COMPLEXITY: Moderate   GOALS: Goals reviewed with patient? No  SHORT TERM GOALS: Target date: 09/12/21  Patient will be independent with initial HEP.  Goal status: INITIAL   LONG TERM GOALS: Target date: 09/26/21  Patient will demonstrate improved functional UE strength as demonstrated by >=4+/5 in muscle groups. Goal status: INITIAL  2.  Patient will report 15 or lower on QUICKDASH to demonstrate improved functional ability.  Goal status: INITIAL  3. Patient will report a decrease in chest pain on NPRS from 8/10 to 5/10 or better.   Baseline: 8/10 Goal status: INITIAL    PLAN: PT FREQUENCY: 1-2x/week  PT DURATION: 4 weeks  PLANNED INTERVENTIONS: Therapeutic exercises, Therapeutic activity, Neuromuscular re-education, Balance training, Gait training, Patient/Family education, Joint mobilization, Cryotherapy, Moist heat, Ionotophoresis '4mg'$ /ml Dexamethasone, and Manual therapy  PLAN FOR NEXT SESSION: review HEP, start with UE/pec strengthening, monitor chest pain   Andris Baumann, PT 08/22/2021, 11:32 AM

## 2021-08-28 ENCOUNTER — Ambulatory Visit: Payer: Medicare HMO | Attending: Family Medicine | Admitting: Physical Therapy

## 2021-08-28 ENCOUNTER — Other Ambulatory Visit: Payer: Self-pay | Admitting: Gastroenterology

## 2021-08-28 ENCOUNTER — Encounter: Payer: Self-pay | Admitting: Physical Therapy

## 2021-08-28 DIAGNOSIS — R1310 Dysphagia, unspecified: Secondary | ICD-10-CM | POA: Diagnosis not present

## 2021-08-28 DIAGNOSIS — R269 Unspecified abnormalities of gait and mobility: Secondary | ICD-10-CM | POA: Insufficient documentation

## 2021-08-28 DIAGNOSIS — R0789 Other chest pain: Secondary | ICD-10-CM | POA: Insufficient documentation

## 2021-08-28 DIAGNOSIS — K219 Gastro-esophageal reflux disease without esophagitis: Secondary | ICD-10-CM | POA: Diagnosis not present

## 2021-08-28 DIAGNOSIS — M6281 Muscle weakness (generalized): Secondary | ICD-10-CM | POA: Insufficient documentation

## 2021-08-28 NOTE — Therapy (Signed)
OUTPATIENT PHYSICAL THERAPY SHOULDER TREATMENT   Patient Name: Paige Pena MRN: [id number] DOB:[birth year], 81 y.o., female Today's Date: 08/28/2021   PT End of Session - 08/28/21 1308     Visit Number 2    Date for PT Re-Evaluation 09/26/21    Authorization Type [employer]    PT Start Time 1308    PT Stop Time 1345    PT Time Calculation (min) 37 min    Activity Tolerance Patient tolerated treatment well    Behavior During Therapy [facility] for tasks assessed/performed             Past Medical History:  Diagnosis Date   Acute cystitis    Anxiety    Aphasia    transsient aphasia Jan 2012   Asthmatic bronchitis    Cerebrovascular disease    Chest pain, atypical    Diverticulosis of colon    DJD (degenerative joint disease)    Dysphonia    Esophageal stricture    Fibromyalgia    Gastroparesis    GERD (gastroesophageal reflux disease)    Hematuria, microscopic    Hemorrhoids    Hypercholesteremia    IBS (irritable bowel syndrome)    Memory deficits 02/15/2013   Memory loss    RLS (restless legs syndrome)    TIA (transient ischemic attack)    Urinary incontinence    Vitamin D deficiency    Past Surgical History:  Procedure Laterality Date   ABDOMINAL HYSTERECTOMY  1995   corneal transplant for keratonconus     EYE SURGERY  2013   left foot fracture with ORIF  07/2004   Dr. Sharol Given   right bunion surgery     Patient Active Problem List   Diagnosis Date Noted   Osteopenia 07/25/2021   Throat irritation 11/28/2019   HLD (hyperlipidemia)    Overactive bladder 05/08/2015   Aphasia    Cervical spondylitis (HCC)    Numbness and tingling 03/21/2015   TIA (transient ischemic attack) 03/21/2015   Physical exam 10/05/2014   Abdominal pain 05/17/2014   Allergic rhinitis 05/17/2014   Fatigue 04/04/2014   Anxiety and depression 07/25/2013   Weight loss 07/25/2013   Memory deficits 02/15/2013   Lumbar spondylosis 09/21/2012   Tachycardia 09/25/2011    Palpitations 09/16/2010   Dysphonia 09/05/2010   Slowing of urinary stream 09/25/2009   Cerebrovascular disease 04/03/2008   DYSPHONIA 04/03/2008   Diverticulosis of large intestine 04/02/2008   Irritable bowel syndrome 04/02/2008   DEGENERATIVE JOINT DISEASE 04/02/2008   HEMATURIA, MICROSCOPIC, HX OF 04/02/2008   Vitamin D deficiency 03/28/2008   Fibromyalgia 03/28/2008   URINARY INCONTINENCE 03/28/2008   GERD 08/02/2007   Gastroparesis 08/02/2007   HEMORRHOIDS 07/30/2007   ESOPHAGEAL STRICTURE 07/30/2007   Anxiety state 07/30/2007    PCP: Annye Asa  REFERRING PROVIDER: Annye Asa  REFERRING DIAG: R07.89  THERAPY DIAG:  Muscle weakness (generalized)  Other chest pain  Rationale for Evaluation and Treatment Rehabilitation  ONSET DATE: 07/25/21  SUBJECTIVE:  SUBJECTIVE STATEMENT: Not feeling well today, pt reports some fatigue  PERTINENT HISTORY: Fibromyaglia, dysphonia, anxiety, depression, osteopenia  PAIN:  Are you having pain? 0/10  PRECAUTIONS: Fall  WEIGHT BEARING RESTRICTIONS No  FALLS:  Has patient fallen in last 6 months? Yes. Number of falls 1 off a ladder  LIVING ENVIRONMENT: Lives with: lives with their spouse Lives in: House/apartment Stairs: No Has following equipment at home: None  OCCUPATION: retired  PLOF: Independent  PATIENT GOALS to get rid of pain   OBJECTIVE:   DIAGNOSTIC FINDINGS:  Right Chest DG 2/14 IMPRESSION: 1. No displaced right rib fracture identified. 2. No acute cardiopulmonary abnormality.  PATIENT SURVEYS:  Quick Dash 29.5   POSTURE: Forward head, rounded shoulders  UPPER EXTREMITY ROM: WFL  MMT Right eval Left eval  Shoulder flexion (feels a pressure in her chest) 3+ 3+  Shoulder extension    Shoulder  abduction 4 4  Shoulder adduction    Shoulder internal rotation 3 4  Shoulder external rotation 4 4  Elbow flexion 4 4  Elbow extension 4 4  Wrist flexion    Wrist extension    Wrist ulnar deviation    Wrist radial deviation    Wrist pronation    Wrist supination    (Blank rows = not tested)  Horizontal ABD 4/5 & Horizontal ADD 4/5 causes some pressure at xiphoid process     TODAY'S TREATMENT:  08/28/21 NuStep L4 x 6 min  Rows Green 2x10 Shoulder Ext green 2x10 Shoulder ER yellow  2x10 Shoulder Horiz abd yellow 2x10 Seated bilat OHP 3lb 2x10   Eval POC 6MWT- 17 laps   PATIENT EDUCATION: Education details: POC Person educated: Patient Education method: Explanation Education comprehension: verbalized understanding   HOME EXERCISE PROGRAM: Access Code: [email or web address]  Exercises - Single Arm Doorway Pec Stretch at 90 Degrees Abduction  - 2 x daily - 7 x weekly - 30 hold - Standing Shoulder Horizontal Abduction with Resistance  - 1 x daily - 7 x weekly - 3 sets - 10 reps  ASSESSMENT:  CLINICAL IMPRESSION: Pt enters ~ 10 minutes late for today's session. She reports no pain. All interventions completed well, postural cue require with standing rows. Tactile cues needed to maintain arm positioning with external rotation. UE fatigue reported with OHP.    OBJECTIVE IMPAIRMENTS decreased strength and pain.   ACTIVITY LIMITATIONS carrying, lifting, sleeping, and pushing, pulling  PERSONAL FACTORS Age, Behavior pattern, Time since onset of injury/illness/exacerbation, and 3+ comorbidities: CVD, Fibromyalgia, GERD, TIA  are also affecting patient's functional outcome.   REHAB POTENTIAL: Fair if her origin is MSK  CLINICAL DECISION MAKING: Evolving/moderate complexity  EVALUATION COMPLEXITY: Moderate   GOALS: Goals reviewed with patient? No  SHORT TERM GOALS: Target date: 09/12/21  Patient will be independent with initial HEP.  Goal status: INITIAL   LONG TERM  GOALS: Target date: 09/26/21  Patient will demonstrate improved functional UE strength as demonstrated by >=4+/5 in muscle groups. Goal status: INITIAL  2.  Patient will report 15 or lower on QUICKDASH to demonstrate improved functional ability.  Goal status: INITIAL  3. Patient will report a decrease in chest pain on NPRS from 8/10 to 5/10 or better.   Baseline: 8/10 Goal status: INITIAL    PLAN: PT FREQUENCY: 1-2x/week  PT DURATION: 4 weeks  PLANNED INTERVENTIONS: Therapeutic exercises, Therapeutic activity, Neuromuscular re-education, Balance training, Gait training, Patient/Family education, Joint mobilization, Cryotherapy, Moist heat, Ionotophoresis '4mg'$ /ml Dexamethasone, and Manual therapy  PLAN FOR NEXT SESSION:  review HEP, start with UE/pec strengthening, monitor chest pain   Scot Jun, PTA 08/28/2021, 1:09 PM

## 2021-08-30 ENCOUNTER — Ambulatory Visit
Admission: RE | Admit: 2021-08-30 | Discharge: 2021-08-30 | Disposition: A | Discharge status: Home / Self Care | Payer: Medicare HMO | Source: Ambulatory Visit | Attending: Gastroenterology | Admitting: Gastroenterology

## 2021-08-30 DIAGNOSIS — R1310 Dysphagia, unspecified: Secondary | ICD-10-CM | POA: Diagnosis not present

## 2021-09-03 ENCOUNTER — Ambulatory Visit: Payer: Medicare HMO | Admitting: Emergency Medicine

## 2021-09-03 ENCOUNTER — Telehealth: Payer: Self-pay | Admitting: Family Medicine

## 2021-09-03 ENCOUNTER — Other Ambulatory Visit: Payer: Self-pay

## 2021-09-03 ENCOUNTER — Encounter: Payer: Self-pay | Admitting: Emergency Medicine

## 2021-09-03 DIAGNOSIS — R911 Solitary pulmonary nodule: Secondary | ICD-10-CM

## 2021-09-03 DIAGNOSIS — R269 Unspecified abnormalities of gait and mobility: Secondary | ICD-10-CM

## 2021-09-03 NOTE — Telephone Encounter (Signed)
Referral has been placed by Claiborne Billings and pt's daughter is aware

## 2021-09-03 NOTE — Progress Notes (Signed)
Subjective:    Patient ID: Paige Pena, female    DOB: 1940-02-27, 81 y.o.   MRN: [id number]  HPI 81 year old never smoker with a history of asthmatic bronchitis, fibromyalgia, gastroparesis and IBS, GERD, hyperlipidemia, restless legs.  She has seen Dr. Lenna Gilford in the past, last in 2014.  She is referred today for an abnormal CT.  She underwent a CT scan of the abdomen as below to eval microscopic hematuria  Never CA Never smoker Brother w lung CA.  No pulmonary symptoms. Complains of some throat raspiness, hoarseness.   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  No mold  CT scan of the abdomen and pelvis performed 05/12/2021 reviewed by me, shows a 4 mm groundglass pulmonary nodule in the left lower lobe   Review of Systems As per HPI  Past Medical History:  Diagnosis Date   Acute cystitis    Anxiety    Aphasia    transsient aphasia Jan 2012   Asthmatic bronchitis    Cerebrovascular disease    Chest pain, atypical    Diverticulosis of colon    DJD (degenerative joint disease)    Dysphonia    Esophageal stricture    Fibromyalgia    Gastroparesis    GERD (gastroesophageal reflux disease)    Hematuria, microscopic    Hemorrhoids    Hypercholesteremia    IBS (irritable bowel syndrome)    Memory deficits 02/15/2013   Memory loss    RLS (restless legs syndrome)    TIA (transient ischemic attack)    Urinary incontinence    Vitamin D deficiency      Family History  Problem Relation Age of Onset   Hyperlipidemia Sister    Diabetes Brother    Cancer Brother        lung   Hypertension Mother    Anemia Mother    Dementia Mother    Kidney disease Father    Breast cancer Other        aunt   Diabetes Other        aunt,brother,uncle   Kidney disease Other        father   Dementia Maternal Grandmother      Social History   Socioeconomic History   Marital status: Married    Spouse name: Mallie Mussel   Number of children: 3   Years  of education: college-2   Highest education level: Not on file  Occupational History    Employer: RETIRED  Tobacco Use   Smoking status: Never   Smokeless tobacco: Never  Vaping Use   Vaping Use: Never used  Substance and Sexual Activity   Alcohol use: Yes    Comment: occ   Drug use: No   Sexual activity: Not on file  Other Topics Concern   Not on file  Social History Narrative   Lennie Hummer   [place], daughter   Aubreanna Percle info release form to share her medical records with her children   Lives at home w/ her husband   Right-handed   Caffeine: tea on occasion   Social Determinants of Health   Financial Resource Strain: Low Risk  (07/11/2021)   Overall Financial Resource Strain (CARDIA)    Difficulty of Paying Living Expenses: Not hard at all  Food Insecurity: No Food Insecurity (07/11/2021)   Hunger Vital Sign    Worried About Running Out of Food in the Last Year: Never true    Ran Out  of Food in the Last Year: Never true  Transportation Needs: No Transportation Needs (07/11/2021)   PRAPARE - Hydrologist (Medical): No    Lack of Transportation (Non-Medical): No  Physical Activity: Inactive (07/11/2021)   Exercise Vital Sign    Days of Exercise per Week: 0 days    Minutes of Exercise per Session: 0 min  Stress: Stress Concern Present (07/11/2021)   [place]    Feeling of Stress : Rather much  Social Connections: Moderately Integrated (07/11/2021)   Social Connection and Isolation Panel [NHANES]    Frequency of Communication with Friends and Family: More than three times a week    Frequency of Social Gatherings with Friends and Family: Once a week    Attends Religious Services: More than 4 times per year    Active Member of [employer] or Organizations: No    Attends Archivist Meetings: Never    Marital Status: Married  Arboriculturist Violence: Not At Risk (07/11/2021)   Humiliation, Afraid, Rape, and Kick questionnaire    Fear of Current or Ex-Partner: No    Emotionally Abused: No    Physically Abused: No    Sexually Abused: No     Allergies  Allergen Reactions   Morphine Other (See Comments)    REACTION: lethargy/malaise   Metoclopramide Hcl Anxiety and Other (See Comments)    REACTION: anxious and out of her mind   Aricept [Donepezil Hcl]     nausea   Doxycycline Diarrhea   Penicillins Itching and Rash    Has patient had a PCN reaction causing immediate rash, facial/tongue/throat swelling, SOB or lightheadedness with hypotension: No Has patient had a PCN reaction causing severe rash involving mucus membranes or skin necrosis: No Has patient had a PCN reaction that required hospitalization No Has patient had a PCN reaction occurring within the last 10 years: Yes If all of the above answers are "NO", then may proceed with Cephalosporin use.     Prednisone Itching and Rash   Sulfonamide Derivatives Itching and Rash     Outpatient Medications Prior to Visit  Medication Sig Dispense Refill   aspirin 325 MG EC tablet Take 325 mg by mouth daily.     Biotin 1000 MCG tablet Take 1,000 mcg by mouth 3 (three) times daily.     busPIRone (BUSPAR) 5 MG tablet TAKE 1 TABLET TWICE DAILY 120 tablet 1   Cholecalciferol (VITAMIN D) 50 MCG (2000 UT) CAPS Take by mouth.     COLLAGEN-VITAMIN C PO Take by mouth.     DULoxetine (CYMBALTA) 60 MG capsule TAKE 1 CAPSULE EVERY DAY 90 capsule 0   fluticasone (FLONASE) 50 MCG/ACT nasal spray Place 2 sprays into both nostrils daily as needed for allergies. 16 g 3   Omega-3 Fatty Acids (FISH OIL) 1000 MG CAPS Take by mouth.     omeprazole (PRILOSEC) 40 MG capsule TAKE 1 CAPSULE TWICE DAILY 180 capsule 0   prednisoLONE acetate (PRED FORTE) 1 % ophthalmic suspension Place 1 drop into both eyes as directed.     No facility-administered medications prior to visit.         Objective:   Physical Exam  Vitals:   09/03/21 1528  BP: 124/76  Pulse: 76  Temp: 98.3 F (36.8 C)  TempSrc: Oral  SpO2: 98%  Weight: 120 lb 9.6 oz (54.7 kg)  Height: 5' (1.524 m)    Gen: Pleasant, well-nourished,  in no distress,  normal affect  ENT: No lesions,  mouth clear,  oropharynx clear, no postnasal drip  Neck: No JVD, no stridor  Lungs: No use of accessory muscles, no crackles or wheezing on normal respiration, no wheeze on forced expiration  Cardiovascular: RRR, heart sounds normal, no murmur or gallops, no peripheral edema  Musculoskeletal: No deformities, no cyanosis or clubbing  Neuro: alert, awake, non focal  Skin: Warm, no lesions or rash      Assessment & Plan:  Pulmonary nodule 4 mm nodule a low risk patient.  Could argue to forego any further imaging as the likelihood for malignancy here is very low.  That said it would be reasonable to better characterize the rest of her lung parenchyma to ensure that no nodules are present do merit follow-up.  She agrees and we will arrange for a dedicated CT chest.  If there are no nodules greater than 4 mm then we can likely forego any future scans.   Baltazar Apo, MD, PhD 09/03/2021, 3:46 PM [facility] [phone] or if no answer before 7:00PM call [phone] For any issues after 7:00PM please call eLink [phone]

## 2021-09-03 NOTE — Addendum Note (Signed)
Addended by: Gavin Potters R on: 09/03/2021 04:37 PM   Modules accepted: Orders

## 2021-09-03 NOTE — Assessment & Plan Note (Signed)
4 mm nodule a low risk patient.  Could argue to forego any further imaging as the likelihood for malignancy here is very low.  That said it would be reasonable to better characterize the rest of her lung parenchyma to ensure that no nodules are present do merit follow-up.  She agrees and we will arrange for a dedicated CT chest.  If there are no nodules greater than 4 mm then we can likely forego any future scans.

## 2021-09-03 NOTE — Patient Instructions (Addendum)
We reviewed your CT scan of the abdomen from March 2023.  There is a small left lower lobe nodule present.  We will plan to perform a dedicated CT scan of the chest to better characterize this nodule and to assess for any other nodules in the lungs.  Depending on results we will decide whether any subsequent follow-up scans would be helpful. Follow with Dr. Lamonte Sakai next available after your CT so we can review the results together.

## 2021-09-03 NOTE — Telephone Encounter (Signed)
Pt called in stating that her last visit a referral for her body pulling to the left was to be placed to House for Physical therapy.   Please advise   Pt is aware KT is out of the office

## 2021-09-04 ENCOUNTER — Ambulatory Visit: Payer: Medicare HMO

## 2021-09-04 DIAGNOSIS — R269 Unspecified abnormalities of gait and mobility: Secondary | ICD-10-CM

## 2021-09-04 DIAGNOSIS — M6281 Muscle weakness (generalized): Secondary | ICD-10-CM | POA: Diagnosis not present

## 2021-09-04 DIAGNOSIS — R0789 Other chest pain: Secondary | ICD-10-CM | POA: Diagnosis not present

## 2021-09-04 NOTE — Therapy (Signed)
OUTPATIENT PHYSICAL THERAPY SHOULDER TREATMENT   Patient Name: Paige Pena MRN: [id number] DOB:1940-10-22, 81 y.o., female Today's Date: 09/04/2021   PT End of Session - 09/04/21 1324     Visit Number 3    Date for PT Re-Evaluation 09/26/21    Authorization Type [employer]    PT Start Time 1324    PT Stop Time 1400    PT Time Calculation (min) 36 min    Activity Tolerance Patient tolerated treatment well    Behavior During Therapy [facility] for tasks assessed/performed              Past Medical History:  Diagnosis Date   Acute cystitis    Anxiety    Aphasia    transsient aphasia Jan 2012   Asthmatic bronchitis    Cerebrovascular disease    Chest pain, atypical    Diverticulosis of colon    DJD (degenerative joint disease)    Dysphonia    Esophageal stricture    Fibromyalgia    Gastroparesis    GERD (gastroesophageal reflux disease)    Hematuria, microscopic    Hemorrhoids    Hypercholesteremia    IBS (irritable bowel syndrome)    Memory deficits 02/15/2013   Memory loss    RLS (restless legs syndrome)    TIA (transient ischemic attack)    Urinary incontinence    Vitamin D deficiency    Past Surgical History:  Procedure Laterality Date   ABDOMINAL HYSTERECTOMY  1995   corneal transplant for keratonconus     EYE SURGERY  2013   left foot fracture with ORIF  07/2004   Dr. Sharol Given   right bunion surgery     Patient Active Problem List   Diagnosis Date Noted   Pulmonary nodule 09/03/2021   Osteopenia 07/25/2021   Throat irritation 11/28/2019   HLD (hyperlipidemia)    Overactive bladder 05/08/2015   Aphasia    Cervical spondylitis (HCC)    Numbness and tingling 03/21/2015   TIA (transient ischemic attack) 03/21/2015   Physical exam 10/05/2014   Abdominal pain 05/17/2014   Allergic rhinitis 05/17/2014   Fatigue 04/04/2014   Anxiety and depression 07/25/2013   Weight loss 07/25/2013   Memory deficits 02/15/2013   Lumbar spondylosis 09/21/2012    Tachycardia 09/25/2011   Palpitations 09/16/2010   Dysphonia 09/05/2010   Slowing of urinary stream 09/25/2009   Cerebrovascular disease 04/03/2008   DYSPHONIA 04/03/2008   Diverticulosis of large intestine 04/02/2008   Irritable bowel syndrome 04/02/2008   DEGENERATIVE JOINT DISEASE 04/02/2008   HEMATURIA, MICROSCOPIC, HX OF 04/02/2008   Vitamin D deficiency 03/28/2008   Fibromyalgia 03/28/2008   URINARY INCONTINENCE 03/28/2008   GERD 08/02/2007   Gastroparesis 08/02/2007   HEMORRHOIDS 07/30/2007   ESOPHAGEAL STRICTURE 07/30/2007   Anxiety state 07/30/2007    PCP: Annye Asa  REFERRING PROVIDER: Annye Asa  REFERRING DIAG: R07.89  THERAPY DIAG:  No diagnosis found.  Rationale for Evaluation and Treatment Rehabilitation  ONSET DATE: 07/25/21  SUBJECTIVE:  SUBJECTIVE STATEMENT: Not feeling too good today, just feel tired. Pain in chest may be a little bit better.   PERTINENT HISTORY: Fibromyaglia, dysphonia, anxiety, depression, osteopenia  PAIN:  Are you having pain? 0/10  PRECAUTIONS: Fall  WEIGHT BEARING RESTRICTIONS No  FALLS:  Has patient fallen in last 6 months? Yes. Number of falls 1 off a ladder  LIVING ENVIRONMENT: Lives with: lives with their spouse Lives in: House/apartment Stairs: No Has following equipment at home: None  OCCUPATION: retired  PLOF: Independent  PATIENT GOALS to get rid of pain   OBJECTIVE:   DIAGNOSTIC FINDINGS:  Right Chest DG 2/14 IMPRESSION: 1. No displaced right rib fracture identified. 2. No acute cardiopulmonary abnormality.  PATIENT SURVEYS:  Quick Dash 29.5   POSTURE: Forward head, rounded shoulders  UPPER EXTREMITY ROM: WFL  MMT Right eval Left eval  Shoulder flexion (feels a pressure in her chest) 3+ 3+   Shoulder extension    Shoulder abduction 4 4  Shoulder adduction    Shoulder internal rotation 3 4  Shoulder external rotation 4 4  Elbow flexion 4 4  Elbow extension 4 4  Wrist flexion    Wrist extension    Wrist ulnar deviation    Wrist radial deviation    Wrist pronation    Wrist supination    (Blank rows = not tested)  Horizontal ABD 4/5 & Horizontal ADD 4/5 causes some pressure at xiphoid process     TODAY'S TREATMENT:  09/04/21 UBE L1 x51mns GreenTB rows and ext 2x10 Shoulder ER/IR red x10 each side Shoulder flexion 1# WaTe x10  Shoulder horizontal ABD red 2x10 STS with OHP 2x10  08/28/21 NuStep L4 x 6 min  Rows Green 2x10 Shoulder Ext green 2x10 Shoulder ER yellow  2x10 Shoulder Horiz abd yellow 2x10 Seated bilat OHP 3lb 2x10   Eval POC 6MWT- 17 laps   PATIENT EDUCATION: Education details: POC Person educated: Patient Education method: Explanation Education comprehension: verbalized understanding   HOME EXERCISE PROGRAM: Access Code: [email or web address]  Exercises - Single Arm Doorway Pec Stretch at 90 Degrees Abduction  - 2 x daily - 7 x weekly - 30 hold - Standing Shoulder Horizontal Abduction with Resistance  - 1 x daily - 7 x weekly - 3 sets - 10 reps  ASSESSMENT:  CLINICAL IMPRESSION: Pt enters ~ 10 minutes late for today's session. She reports no pain. Has some difficulty with UBE warm up due to tiredness.  Continued with banded exercises, verbal and tactile cues needed for proper form for all exercises. Patient has a new referral for gait abnormalities seen towards end of visit, will address next visit unless new eval is needed.    OBJECTIVE IMPAIRMENTS decreased strength and pain.   ACTIVITY LIMITATIONS carrying, lifting, sleeping, and pushing, pulling  PERSONAL FACTORS Age, Behavior pattern, Time since onset of injury/illness/exacerbation, and 3+ comorbidities: CVD, Fibromyalgia, GERD, TIA  are also affecting patient's functional outcome.    REHAB POTENTIAL: Fair if her origin is MSK  CLINICAL DECISION MAKING: Evolving/moderate complexity  EVALUATION COMPLEXITY: Moderate   GOALS: Goals reviewed with patient? No  SHORT TERM GOALS: Target date: 09/12/21  Patient will be independent with initial HEP.  Goal status: INITIAL   LONG TERM GOALS: Target date: 09/26/21  Patient will demonstrate improved functional UE strength as demonstrated by >=4+/5 in muscle groups. Goal status: INITIAL  2.  Patient will report 15 or lower on QUICKDASH to demonstrate improved functional ability.  Goal status: INITIAL  3. Patient will  report a decrease in chest pain on NPRS from 8/10 to 5/10 or better.   Baseline: 8/10 Goal status: INITIAL    PLAN: PT FREQUENCY: 1-2x/week  PT DURATION: 4 weeks  PLANNED INTERVENTIONS: Therapeutic exercises, Therapeutic activity, Neuromuscular re-education, Balance training, Gait training, Patient/Family education, Joint mobilization, Cryotherapy, Moist heat, Ionotophoresis '4mg'$ /ml Dexamethasone, and Manual therapy  PLAN FOR NEXT SESSION: review HEP, start with UE/pec strengthening, monitor chest pain, LE assessment and adding in new gait referral    Andris Baumann, PT 09/04/2021, 2:00 PM

## 2021-09-06 ENCOUNTER — Ambulatory Visit: Payer: Medicare HMO | Admitting: Physical Therapy

## 2021-09-07 ENCOUNTER — Ambulatory Visit (HOSPITAL_BASED_OUTPATIENT_CLINIC_OR_DEPARTMENT_OTHER)
Admission: RE | Admit: 2021-09-07 | Discharge: 2021-09-07 | Disposition: A | Discharge status: Home / Self Care | Payer: Medicare HMO | Source: Ambulatory Visit | Attending: Emergency Medicine | Admitting: Emergency Medicine

## 2021-09-07 DIAGNOSIS — R918 Other nonspecific abnormal finding of lung field: Secondary | ICD-10-CM | POA: Diagnosis not present

## 2021-09-07 DIAGNOSIS — R911 Solitary pulmonary nodule: Secondary | ICD-10-CM | POA: Diagnosis not present

## 2021-09-07 DIAGNOSIS — J479 Bronchiectasis, uncomplicated: Secondary | ICD-10-CM | POA: Diagnosis not present

## 2021-09-07 DIAGNOSIS — J984 Other disorders of lung: Secondary | ICD-10-CM | POA: Diagnosis not present

## 2021-09-09 ENCOUNTER — Ambulatory Visit: Payer: Medicare HMO

## 2021-09-09 DIAGNOSIS — M6281 Muscle weakness (generalized): Secondary | ICD-10-CM | POA: Diagnosis not present

## 2021-09-09 DIAGNOSIS — R269 Unspecified abnormalities of gait and mobility: Secondary | ICD-10-CM | POA: Diagnosis not present

## 2021-09-09 DIAGNOSIS — R0789 Other chest pain: Secondary | ICD-10-CM | POA: Diagnosis not present

## 2021-09-09 NOTE — Therapy (Signed)
OUTPATIENT PHYSICAL THERAPY SHOULDER TREATMENT   Patient Name: Paige Pena MRN: [id number] DOB:01-Aug-1940, 81 y.o., female Today's Date: 09/09/2021   PT End of Session - 09/09/21 1412     Visit Number 4    Date for PT Re-Evaluation 09/26/21    Authorization Type [employer]    PT Start Time 9    PT Stop Time 3428    PT Time Calculation (min) 33 min    Activity Tolerance Patient tolerated treatment well    Behavior During Therapy WFL for tasks assessed/performed               Past Medical History:  Diagnosis Date   Acute cystitis    Anxiety    Aphasia    transsient aphasia Jan 2012   Asthmatic bronchitis    Cerebrovascular disease    Chest pain, atypical    Diverticulosis of colon    DJD (degenerative joint disease)    Dysphonia    Esophageal stricture    Fibromyalgia    Gastroparesis    GERD (gastroesophageal reflux disease)    Hematuria, microscopic    Hemorrhoids    Hypercholesteremia    IBS (irritable bowel syndrome)    Memory deficits 02/15/2013   Memory loss    RLS (restless legs syndrome)    TIA (transient ischemic attack)    Urinary incontinence    Vitamin D deficiency    Past Surgical History:  Procedure Laterality Date   ABDOMINAL HYSTERECTOMY  1995   corneal transplant for keratonconus     EYE SURGERY  2013   left foot fracture with ORIF  07/2004   Dr. Sharol Given   right bunion surgery     Patient Active Problem List   Diagnosis Date Noted   Pulmonary nodule 09/03/2021   Osteopenia 07/25/2021   Throat irritation 11/28/2019   HLD (hyperlipidemia)    Overactive bladder 05/08/2015   Aphasia    Cervical spondylitis (HCC)    Numbness and tingling 03/21/2015   TIA (transient ischemic attack) 03/21/2015   Physical exam 10/05/2014   Abdominal pain 05/17/2014   Allergic rhinitis 05/17/2014   Fatigue 04/04/2014   Anxiety and depression 07/25/2013   Weight loss 07/25/2013   Memory deficits 02/15/2013   Lumbar spondylosis 09/21/2012    Tachycardia 09/25/2011   Palpitations 09/16/2010   Dysphonia 09/05/2010   Slowing of urinary stream 09/25/2009   Cerebrovascular disease 04/03/2008   DYSPHONIA 04/03/2008   Diverticulosis of large intestine 04/02/2008   Irritable bowel syndrome 04/02/2008   DEGENERATIVE JOINT DISEASE 04/02/2008   HEMATURIA, MICROSCOPIC, HX OF 04/02/2008   Vitamin D deficiency 03/28/2008   Fibromyalgia 03/28/2008   URINARY INCONTINENCE 03/28/2008   GERD 08/02/2007   Gastroparesis 08/02/2007   HEMORRHOIDS 07/30/2007   ESOPHAGEAL STRICTURE 07/30/2007   Anxiety state 07/30/2007    PCP: Annye Asa  REFERRING PROVIDER: Annye Asa  REFERRING DIAG: R07.89  THERAPY DIAG:  Abnormal gait  Muscle weakness (generalized)  Other chest pain  Rationale for Evaluation and Treatment Rehabilitation  ONSET DATE: 07/25/21  SUBJECTIVE:  SUBJECTIVE STATEMENT: Chest pain is some better, my right leg is feeling a little numb. I stumble over steps sometimes because my R leg is numb.   PERTINENT HISTORY: Fibromyaglia, dysphonia, anxiety, depression, osteopenia  PAIN:  Are you having pain? 0/10  PRECAUTIONS: Fall  WEIGHT BEARING RESTRICTIONS No  FALLS:  Has patient fallen in last 6 months? Yes. Number of falls 1 off a ladder  LIVING ENVIRONMENT: Lives with: lives with their spouse Lives in: House/apartment Stairs: No Has following equipment at home: None  OCCUPATION: retired  PLOF: Independent  PATIENT GOALS to get rid of pain   OBJECTIVE:   DIAGNOSTIC FINDINGS:  Right Chest DG 2/14 IMPRESSION: 1. No displaced right rib fracture identified. 2. No acute cardiopulmonary abnormality.  PATIENT SURVEYS:  Quick Dash 29.5   POSTURE: Forward head, rounded shoulders  UPPER EXTREMITY ROM: WFL  MMT  Right eval Left eval  Shoulder flexion (feels a pressure in her chest) 3+ 3+  Shoulder extension    Shoulder abduction 4 4  Shoulder adduction    Shoulder internal rotation 3 4  Shoulder external rotation 4 4  Elbow flexion 4 4  Elbow extension 4 4  Wrist flexion    Wrist extension    Wrist ulnar deviation    Wrist radial deviation    Wrist pronation    Wrist supination    (Blank rows = not tested)  Horizontal ABD 4/5 & Horizontal ADD 4/5 causes some pressure at xiphoid process     TODAY'S TREATMENT:  09/09/21 MMT  Grossly 4/5 overall LE strength  5xSTS: 17.86 s TUG 12.52 s FGA: 21/30  Nustep L5 x44mns  Step ups 6" x10 each side  Seated rows #10 2x10 Lat pull downs 10# 2x10   09/04/21 UBE L1 x667ms GreenTB rows and ext 2x10 Shoulder ER/IR red x10 each side Shoulder flexion 1# WaTe x10  Shoulder horizontal ABD red 2x10 STS with OHP 2x10  08/28/21 NuStep L4 x 6 min  Rows Green 2x10 Shoulder Ext green 2x10 Shoulder ER yellow  2x10 Shoulder Horiz abd yellow 2x10 Seated bilat OHP 3lb 2x10   Eval POC 6MWT- 17 laps   PATIENT EDUCATION: Education details: POC Person educated: Patient Education method: Explanation Education comprehension: verbalized understanding   HOME EXERCISE PROGRAM: Access Code: [email or web address]  Exercises - Single Arm Doorway Pec Stretch at 90 Degrees Abduction  - 2 x daily - 7 x weekly - 30 hold - Standing Shoulder Horizontal Abduction with Resistance  - 1 x daily - 7 x weekly - 3 sets - 10 reps  ASSESSMENT:  CLINICAL IMPRESSION: Pt enters ~ 13 minutes late for today's session. She has a new referral for abnormal gait. We did a quick assessment of LE strength and ROM, and completed some functional tests. Goals were updates and POC was extended. Completed general strength training today, patient able to complete session without complaints.    OBJECTIVE IMPAIRMENTS decreased strength and pain.   ACTIVITY LIMITATIONS carrying, lifting,  sleeping, and pushing, pulling  PERSONAL FACTORS Age, Behavior pattern, Time since onset of injury/illness/exacerbation, and 3+ comorbidities: CVD, Fibromyalgia, GERD, TIA  are also affecting patient's functional outcome.   REHAB POTENTIAL: Fair if her origin is [facility]  CLINICAL DECISION MAKING: Evolving/moderate complexity  EVALUATION COMPLEXITY: Moderate   GOALS: Goals reviewed with patient? No  SHORT TERM GOALS: Target date: 09/12/21  Patient will be independent with initial HEP.  Goal status: INITIAL   LONG TERM GOALS: Target date: 10/21/21  Patient will demonstrate improved  functional UE strength as demonstrated by >=4+/5 in muscle groups. Goal status: INITIAL  2.  Patient will report 15 or lower on QUICKDASH to demonstrate improved functional ability.  Goal status: INITIAL  3. Patient will report a decrease in chest pain on NPRS from 8/10 to 5/10 or better.   Baseline: 8/10 Goal status: INITIAL   4. Patient will demonstrate <12s on TUG and 5xSTS to decrease fall risk. Baseline: 12.52s, 17.86s Goal status: INITIAL   5. Patient will score 25 or higher on FGA to demonstrate improved functional gait to ambulate within the community safely.   Baseline: 21/30   Goal status: INITIAL   PLAN: PT FREQUENCY: 1-2x/week  PT DURATION: 8 weeks  PLANNED INTERVENTIONS: Therapeutic exercises, Therapeutic activity, Neuromuscular re-education, Balance training, Gait training, Patient/Family education, Joint mobilization, Cryotherapy, Moist heat, Ionotophoresis '4mg'$ /ml Dexamethasone, and Manual therapy  PLAN FOR NEXT SESSION: review HEP, start with UE/pec strengthening, monitor chest pain, LE assessment and adding in new gait referral    Andris Baumann, PT 09/09/2021, 2:46 PM

## 2021-09-10 ENCOUNTER — Ambulatory Visit (HOSPITAL_BASED_OUTPATIENT_CLINIC_OR_DEPARTMENT_OTHER): Payer: Medicare HMO

## 2021-09-10 ENCOUNTER — Telehealth: Payer: Self-pay | Admitting: Emergency Medicine

## 2021-09-11 ENCOUNTER — Ambulatory Visit: Payer: Medicare HMO

## 2021-09-11 NOTE — Telephone Encounter (Signed)
Called and spoke with patient and her daughter Santiago Glad for over 20 minutes about the results. Both verbalized understanding of the results.   Santiago Glad wanted a further explanation on the calcification found within the lungs. I offered an appt but Santiago Glad lives in [place].   RB, can you please advise? Thanks!

## 2021-09-11 NOTE — Telephone Encounter (Signed)
Called and spoke to patient and she is wanting the results of her CT scan that was done 7/17.   Please advise sir

## 2021-09-11 NOTE — Therapy (Signed)
OUTPATIENT PHYSICAL THERAPY SHOULDER TREATMENT   Patient Name: Paige Pena MRN: [id number] DOB:1940/11/20, 81 y.o., female Today's Date: 09/12/2021   PT End of Session - 09/12/21 1153     Visit Number 5    Date for PT Re-Evaluation 10/21/21    Authorization Type [employer]    PT Start Time 8    PT Stop Time 1230    PT Time Calculation (min) 37 min    Activity Tolerance Patient tolerated treatment well    Behavior During Therapy WFL for tasks assessed/performed                Past Medical History:  Diagnosis Date   Acute cystitis    Anxiety    Aphasia    transsient aphasia Jan 2012   Asthmatic bronchitis    Cerebrovascular disease    Chest pain, atypical    Diverticulosis of colon    DJD (degenerative joint disease)    Dysphonia    Esophageal stricture    Fibromyalgia    Gastroparesis    GERD (gastroesophageal reflux disease)    Hematuria, microscopic    Hemorrhoids    Hypercholesteremia    IBS (irritable bowel syndrome)    Memory deficits 02/15/2013   Memory loss    RLS (restless legs syndrome)    TIA (transient ischemic attack)    Urinary incontinence    Vitamin D deficiency    Past Surgical History:  Procedure Laterality Date   ABDOMINAL HYSTERECTOMY  1995   corneal transplant for keratonconus     EYE SURGERY  2013   left foot fracture with ORIF  07/2004   Dr. Sharol Given   right bunion surgery     Patient Active Problem List   Diagnosis Date Noted   Pulmonary nodule 09/03/2021   Osteopenia 07/25/2021   Throat irritation 11/28/2019   HLD (hyperlipidemia)    Overactive bladder 05/08/2015   Aphasia    Cervical spondylitis (HCC)    Numbness and tingling 03/21/2015   TIA (transient ischemic attack) 03/21/2015   Physical exam 10/05/2014   Abdominal pain 05/17/2014   Allergic rhinitis 05/17/2014   Fatigue 04/04/2014   Anxiety and depression 07/25/2013   Weight loss 07/25/2013   Memory deficits 02/15/2013   Lumbar spondylosis 09/21/2012    Tachycardia 09/25/2011   Palpitations 09/16/2010   Dysphonia 09/05/2010   Slowing of urinary stream 09/25/2009   Cerebrovascular disease 04/03/2008   DYSPHONIA 04/03/2008   Diverticulosis of large intestine 04/02/2008   Irritable bowel syndrome 04/02/2008   DEGENERATIVE JOINT DISEASE 04/02/2008   HEMATURIA, MICROSCOPIC, HX OF 04/02/2008   Vitamin D deficiency 03/28/2008   Fibromyalgia 03/28/2008   URINARY INCONTINENCE 03/28/2008   GERD 08/02/2007   Gastroparesis 08/02/2007   HEMORRHOIDS 07/30/2007   ESOPHAGEAL STRICTURE 07/30/2007   Anxiety state 07/30/2007    PCP: Annye Asa  REFERRING PROVIDER: Annye Asa  REFERRING DIAG: R07.89  THERAPY DIAG:  Muscle weakness (generalized)  Abnormal gait  Other chest pain  Rationale for Evaluation and Treatment Rehabilitation  ONSET DATE: 07/25/21  SUBJECTIVE:  SUBJECTIVE STATEMENT: Having some slight pain in the chest, but it has gotten some better.    PERTINENT HISTORY: Fibromyaglia, dysphonia, anxiety, depression, osteopenia  PAIN:  Are you having pain? 0/10  PRECAUTIONS: Fall  WEIGHT BEARING RESTRICTIONS No  FALLS:  Has patient fallen in last 6 months? Yes. Number of falls 1 off a ladder  LIVING ENVIRONMENT: Lives with: lives with their spouse Lives in: House/apartment Stairs: No Has following equipment at home: None  OCCUPATION: retired  PLOF: Independent  PATIENT GOALS to get rid of pain   OBJECTIVE:   DIAGNOSTIC FINDINGS:  Right Chest DG 2/14 IMPRESSION: 1. No displaced right rib fracture identified. 2. No acute cardiopulmonary abnormality.  PATIENT SURVEYS:  Quick Dash 29.5   POSTURE: Forward head, rounded shoulders  UPPER EXTREMITY ROM: WFL  MMT Right eval Left eval  Shoulder flexion (feels a  pressure in her chest) 3+ 3+  Shoulder extension    Shoulder abduction 4 4  Shoulder adduction    Shoulder internal rotation 3 4  Shoulder external rotation 4 4  Elbow flexion 4 4  Elbow extension 4 4  Wrist flexion    Wrist extension    Wrist ulnar deviation    Wrist radial deviation    Wrist pronation    Wrist supination    (Blank rows = not tested)  Horizontal ABD 4/5 & Horizontal ADD 4/5 causes some pressure at xiphoid process     TODAY'S TREATMENT:  09/11/21 Nustep L4 x37mns (UE and LE) STS with OHP yellow ball 2x10 Knee ext 10# 2x10 Knee flexion 15# 2x10 Seated rows 15# 2x10 Lat pull downs 10# 2x10 Chest press 5# x10  09/09/21 MMT  Grossly 4/5 overall LE strength  5xSTS: 17.86 s TUG 12.52 s FGA: 21/30  Nustep L5 x536ms  Step ups 6" x10 each side  Seated rows #10 2x10 Lat pull downs 10# 2x10   09/04/21 UBE L1 x6m58m GreenTB rows and ext 2x10 Shoulder ER/IR red x10 each side Shoulder flexion 1# WaTe x10  Shoulder horizontal ABD red 2x10 STS with OHP 2x10  08/28/21 NuStep L4 x 6 min  Rows Green 2x10 Shoulder Ext green 2x10 Shoulder ER yellow  2x10 Shoulder Horiz abd yellow 2x10 Seated bilat OHP 3lb 2x10   Eval POC 6MWT- 17 laps   PATIENT EDUCATION: Education details: POC Person educated: Patient Education method: Explanation Education comprehension: verbalized understanding   HOME EXERCISE PROGRAM: Access Code: [email or web address]  Exercises - Single Arm Doorway Pec Stretch at 90 Degrees Abduction  - 2 x daily - 7 x weekly - 30 hold - Standing Shoulder Horizontal Abduction with Resistance  - 1 x daily - 7 x weekly - 3 sets - 10 reps  ASSESSMENT:  CLINICAL IMPRESSION: Pt enters ~ 10 minutes late for today's session. Completed general strength training today incorporating both UE and LE to address both pec strain and gait abnormalities. Tolerates all exercises well.    OBJECTIVE IMPAIRMENTS decreased strength and pain.   ACTIVITY LIMITATIONS  carrying, lifting, sleeping, and pushing, pulling  PERSONAL FACTORS Age, Behavior pattern, Time since onset of injury/illness/exacerbation, and 3+ comorbidities: CVD, Fibromyalgia, GERD, TIA  are also affecting patient's functional outcome.   REHAB POTENTIAL: Fair if her origin is [facility]  CLINICAL DECISION MAKING: Evolving/moderate complexity  EVALUATION COMPLEXITY: Moderate   GOALS: Goals reviewed with patient? No  SHORT TERM GOALS: Target date: 09/12/21  Patient will be independent with initial HEP.  Goal status: INITIAL   LONG TERM GOALS: Target date: 10/21/21  Patient will demonstrate improved functional UE strength as demonstrated by >=4+/5 in muscle groups. Goal status: INITIAL  2.  Patient will report 15 or lower on QUICKDASH to demonstrate improved functional ability.  Goal status: INITIAL  3. Patient will report a decrease in chest pain on NPRS from 8/10 to 5/10 or better.   Baseline: 8/10 Goal status: INITIAL   4. Patient will demonstrate <12s on TUG and 5xSTS to decrease fall risk. Baseline: 12.52s, 17.86s Goal status: INITIAL   5. Patient will score 25 or higher on FGA to demonstrate improved functional gait to ambulate within the community safely.   Baseline: 21/30   Goal status: INITIAL   PLAN: PT FREQUENCY: 1-2x/week  PT DURATION: 8 weeks  PLANNED INTERVENTIONS: Therapeutic exercises, Therapeutic activity, Neuromuscular re-education, Balance training, Gait training, Patient/Family education, Joint mobilization, Cryotherapy, Moist heat, Ionotophoresis '4mg'$ /ml Dexamethasone, and Manual therapy  PLAN FOR NEXT SESSION: review HEP, start with UE/pec strengthening, monitor chest pain, LE assessment and adding in new gait referral    Andris Baumann, PT 09/12/2021, 12:31 PM

## 2021-09-11 NOTE — Telephone Encounter (Signed)
Please let her know that I reviewed the CT scan of the chest.  This shows that the pulmonary nodule in question is unchanged, 4 mm.  There are no other new nodules or findings present.  This is good news.  She should not need any more scans going forward unless there is a clinical change.

## 2021-09-12 ENCOUNTER — Ambulatory Visit: Payer: Medicare HMO

## 2021-09-12 ENCOUNTER — Encounter: Payer: Medicare HMO | Admitting: Orthopedic Surgery

## 2021-09-12 DIAGNOSIS — M6281 Muscle weakness (generalized): Secondary | ICD-10-CM

## 2021-09-12 DIAGNOSIS — R0789 Other chest pain: Secondary | ICD-10-CM | POA: Diagnosis not present

## 2021-09-12 DIAGNOSIS — R269 Unspecified abnormalities of gait and mobility: Secondary | ICD-10-CM | POA: Diagnosis not present

## 2021-09-12 NOTE — Telephone Encounter (Signed)
She has some old calcified pulmonary nodules that have not changed over time.  They are calcified because they have been present in the body for many years and it picked up some calcium deposits.  They do not have any clinical impact, in fact it is reassuring that they have picked up calcium because it establishes that they are benign.

## 2021-09-12 NOTE — Telephone Encounter (Signed)
Called patient and she is wondering about the nodule near the clavicle bone. And she is wondering if it has changed or if it has grown.   Was the nodule present before 2023?  Santiago Glad the daughter is also asking about the nodule that is in her lung and wants to know if it has been it was noted before 2023.   Dr Lamonte Sakai please advise

## 2021-09-13 NOTE — Progress Notes (Signed)
This encounter was created in error - please disregard.

## 2021-09-16 ENCOUNTER — Ambulatory Visit: Payer: Medicare HMO

## 2021-09-16 DIAGNOSIS — M6281 Muscle weakness (generalized): Secondary | ICD-10-CM | POA: Diagnosis not present

## 2021-09-16 DIAGNOSIS — R0789 Other chest pain: Secondary | ICD-10-CM | POA: Diagnosis not present

## 2021-09-16 DIAGNOSIS — R269 Unspecified abnormalities of gait and mobility: Secondary | ICD-10-CM

## 2021-09-16 NOTE — Therapy (Signed)
OUTPATIENT PHYSICAL THERAPY SHOULDER TREATMENT   Patient Name: Paige Pena MRN: [id number] DOB:06/29/1940, 81 y.o., female Today's Date: 09/16/2021   PT End of Session - 09/16/21 1501     Visit Number 6    Date for PT Re-Evaluation 10/21/21    Authorization Type [employer]    PT Start Time 1500    PT Stop Time 8088    PT Time Calculation (min) 33 min    Activity Tolerance Patient tolerated treatment well    Behavior During Therapy WFL for tasks assessed/performed                Past Medical History:  Diagnosis Date   Acute cystitis    Anxiety    Aphasia    transsient aphasia Jan 2012   Asthmatic bronchitis    Cerebrovascular disease    Chest pain, atypical    Diverticulosis of colon    DJD (degenerative joint disease)    Dysphonia    Esophageal stricture    Fibromyalgia    Gastroparesis    GERD (gastroesophageal reflux disease)    Hematuria, microscopic    Hemorrhoids    Hypercholesteremia    IBS (irritable bowel syndrome)    Memory deficits 02/15/2013   Memory loss    RLS (restless legs syndrome)    TIA (transient ischemic attack)    Urinary incontinence    Vitamin D deficiency    Past Surgical History:  Procedure Laterality Date   ABDOMINAL HYSTERECTOMY  1995   corneal transplant for keratonconus     EYE SURGERY  2013   left foot fracture with ORIF  07/2004   Dr. Sharol Given   right bunion surgery     Patient Active Problem List   Diagnosis Date Noted   Pulmonary nodule 09/03/2021   Osteopenia 07/25/2021   Throat irritation 11/28/2019   HLD (hyperlipidemia)    Overactive bladder 05/08/2015   Aphasia    Cervical spondylitis (HCC)    Numbness and tingling 03/21/2015   TIA (transient ischemic attack) 03/21/2015   Physical exam 10/05/2014   Abdominal pain 05/17/2014   Allergic rhinitis 05/17/2014   Fatigue 04/04/2014   Anxiety and depression 07/25/2013   Weight loss 07/25/2013   Memory deficits 02/15/2013   Lumbar spondylosis 09/21/2012    Tachycardia 09/25/2011   Palpitations 09/16/2010   Dysphonia 09/05/2010   Slowing of urinary stream 09/25/2009   Cerebrovascular disease 04/03/2008   DYSPHONIA 04/03/2008   Diverticulosis of large intestine 04/02/2008   Irritable bowel syndrome 04/02/2008   DEGENERATIVE JOINT DISEASE 04/02/2008   HEMATURIA, MICROSCOPIC, HX OF 04/02/2008   Vitamin D deficiency 03/28/2008   Fibromyalgia 03/28/2008   URINARY INCONTINENCE 03/28/2008   GERD 08/02/2007   Gastroparesis 08/02/2007   HEMORRHOIDS 07/30/2007   ESOPHAGEAL STRICTURE 07/30/2007   Anxiety state 07/30/2007    PCP: Annye Asa  REFERRING PROVIDER: Annye Asa  REFERRING DIAG: R07.89  THERAPY DIAG:  Muscle weakness (generalized)  Abnormal gait  Other chest pain  Rationale for Evaluation and Treatment Rehabilitation  ONSET DATE: 07/25/21  SUBJECTIVE:  SUBJECTIVE STATEMENT: Chest pain is better, I was sore after last visit.   PERTINENT HISTORY: Fibromyaglia, dysphonia, anxiety, depression, osteopenia  PAIN:  Are you having pain? 0/10  PRECAUTIONS: Fall  WEIGHT BEARING RESTRICTIONS No  FALLS:  Has patient fallen in last 6 months? Yes. Number of falls 1 off a ladder  LIVING ENVIRONMENT: Lives with: lives with their spouse Lives in: House/apartment Stairs: No Has following equipment at home: None  OCCUPATION: retired  PLOF: Independent  PATIENT GOALS to get rid of pain   OBJECTIVE:   DIAGNOSTIC FINDINGS:  Right Chest DG 2/14 IMPRESSION: 1. No displaced right rib fracture identified. 2. No acute cardiopulmonary abnormality.  PATIENT SURVEYS:  Quick Dash 29.5   POSTURE: Forward head, rounded shoulders  UPPER EXTREMITY ROM: WFL  MMT Right eval Left eval  Shoulder flexion (feels a pressure in her  chest) 3+ 3+  Shoulder extension    Shoulder abduction 4 4  Shoulder adduction    Shoulder internal rotation 3 4  Shoulder external rotation 4 4  Elbow flexion 4 4  Elbow extension 4 4  Wrist flexion    Wrist extension    Wrist ulnar deviation    Wrist radial deviation    Wrist pronation    Wrist supination    (Blank rows = not tested)  Horizontal ABD 4/5 & Horizontal ADD 4/5 causes some pressure at xiphoid process     TODAY'S TREATMENT:  09/16/21 Nustep L4 x69mns (UE and LE)  Chest fly 3# 2x10 Shoulder ext 5# 2x10 Resisted gait 20# 4 way x4 each Step ups 6"  x10 each side    09/11/21 Nustep L4 x530ms (UE and LE) STS with OHP yellow ball 2x10 Knee ext 10# 2x10 Knee flexion 15# 2x10 Seated rows 15# 2x10 Lat pull downs 10# 2x10 Chest press 5# x10  09/09/21 MMT  Grossly 4/5 overall LE strength  5xSTS: 17.86 s TUG 12.52 s FGA: 21/30  Nustep L5 x5m71m  Step ups 6" x10 each side  Seated rows #10 2x10 Lat pull downs 10# 2x10   09/04/21 UBE L1 x6mi50mGreenTB rows and ext 2x10 Shoulder ER/IR red x10 each side Shoulder flexion 1# WaTe x10  Shoulder horizontal ABD red 2x10 STS with OHP 2x10  08/28/21 NuStep L4 x 6 min  Rows Green 2x10 Shoulder Ext green 2x10 Shoulder ER yellow  2x10 Shoulder Horiz abd yellow 2x10 Seated bilat OHP 3lb 2x10   Eval POC 6MWT- 17 laps   PATIENT EDUCATION: Education details: POC Person educated: Patient Education method: Explanation Education comprehension: verbalized understanding   HOME EXERCISE PROGRAM: Access Code: [email or web address]  Exercises - Single Arm Doorway Pec Stretch at 90 Degrees Abduction  - 2 x daily - 7 x weekly - 30 hold - Standing Shoulder Horizontal Abduction with Resistance  - 1 x daily - 7 x weekly - 3 sets - 10 reps  ASSESSMENT:  CLINICAL IMPRESSION: Pt enters ~ 15 minutes late for today's session. We continued working on pec/shoulder strengthening to address the strain as well as gait and balance with  resisted gait. Difficulty and LOB with side steps when leading with LLE and backwards resisted gait requires minA. Tolerates session well, with some c/o of LLE numbness and feeling heavy, requires verbal cues for form throughout session.    OBJECTIVE IMPAIRMENTS decreased strength and pain.   ACTIVITY LIMITATIONS carrying, lifting, sleeping, and pushing, pulling  PERSONAL FACTORS Age, Behavior pattern, Time since onset of injury/illness/exacerbation, and 3+ comorbidities: CVD, Fibromyalgia, GERD, TIA  are also affecting patient's functional outcome.   REHAB POTENTIAL: Fair if her origin is [facility]  CLINICAL DECISION MAKING: Evolving/moderate complexity  EVALUATION COMPLEXITY: Moderate   GOALS: Goals reviewed with patient? No  SHORT TERM GOALS: Target date: 09/12/21  Patient will be independent with initial HEP.  Goal status: INITIAL   LONG TERM GOALS: Target date: 10/21/21  Patient will demonstrate improved functional UE strength as demonstrated by >=4+/5 in muscle groups. Goal status: INITIAL  2.  Patient will report 15 or lower on QUICKDASH to demonstrate improved functional ability.  Goal status: INITIAL  3. Patient will report a decrease in chest pain on NPRS from 8/10 to 5/10 or better.   Baseline: 8/10 Goal status: INITIAL   4. Patient will demonstrate <12s on TUG and 5xSTS to decrease fall risk. Baseline: 12.52s, 17.86s Goal status: INITIAL   5. Patient will score 25 or higher on FGA to demonstrate improved functional gait to ambulate within the community safely.   Baseline: 21/30   Goal status: INITIAL   PLAN: PT FREQUENCY: 1-2x/week  PT DURATION: 8 weeks  PLANNED INTERVENTIONS: Therapeutic exercises, Therapeutic activity, Neuromuscular re-education, Balance training, Gait training, Patient/Family education, Joint mobilization, Cryotherapy, Moist heat, Ionotophoresis '4mg'$ /ml Dexamethasone, and Manual therapy  PLAN FOR NEXT SESSION: review HEP, start with UE/pec  strengthening, monitor chest pain, LE assessment and adding in new gait referral    Andris Baumann, PT 09/16/2021, 3:34 PM

## 2021-09-18 ENCOUNTER — Ambulatory Visit: Payer: Medicare HMO

## 2021-09-18 NOTE — Telephone Encounter (Signed)
I have reviewed her scan again. The CT chest STABLE compared with a CT abdomen and pelvis done in March 2023, which is the only scan available to me to which I can compare. There are NO concerning findings or nodules. The 53m LLL ground glass nodule in question is unchanged and can be considered benign. Likewise ALL the small scattered calcified nodules are unchanged and are benign. I do not see a nodule adjacent to either clavicle, so I am not sure what she is asking about there.   This is a reassuring scan and no further follow up scans are recommended.

## 2021-09-19 NOTE — Telephone Encounter (Signed)
Called patient. Per DPR, left detailed VM letting her know of her CT results.   Nothing further needed.

## 2021-09-20 NOTE — Telephone Encounter (Signed)
I think I discussed this with her in the office. The spot that she is referring to is a normal muscle and tendon (her sternocleidomastoid).

## 2021-09-20 NOTE — Telephone Encounter (Signed)
Patient called back wanting me to ask Dr. Lamonte Sakai about the spot he felt near her clavicles. Patient wants to know what that was that Dr. Lamonte Sakai felt.   RB, please advise.

## 2021-09-22 NOTE — Progress Notes (Unsigned)
PATIENT: Paige Pena DOB: [birth year]  REASON FOR VISIT: follow up HISTORY FROM: patient PRIMARY NEUROLOGIST: Dr. Jannifer Franklin  No chief complaint on file.    HISTORY OF PRESENT ILLNESS: Today 07/30/23Ms. Pena is a 81 y.o. female here with history of memory deficits and paresthesias.  11/21/20: Paige Pena is a 81 y.o. female here with history of memory deficits. She originally visited Dr. Jannifer Franklin in 2020 for this issue. She is here today for a follow up. Patient reports feeling fatigued, and a sensation in the base of her head on the left. She describes it as painless and a "crawling" sensation. She reports she has been more achy over the last week and contributes this to her fibromyalgia. She also reports a "dead" feeling or tingling sensation in her fingers and feet that has been increasing in frequency. Nerve conduction study in 2013 was unremarkable. Patient denies issues with her memory. Her MMSE was 30/30.    HISTORY  Copied from Dr Tobey Grim note 08/10/18: Paige Pena is a 81 year old right-handed black female with a history of a mild memory disturbance and a history of neck pain and occasional headaches.  The patient has had some low back pain and sciatica type pain on her last visit in January 2020, but she believes that the symptoms have improved.  She still has some neck pain, she reports some left-sided occipital headaches and some frontal headaches as well that may come on 2 or 3 times a week and last 15 minutes and then go away.  She does not wish to have medications for the headache currently.  She has been under some increased stress recently as her son-in-law has become seriously ill with the COVID virus.  She believes that she has possibly become somewhat depressed with isolation from the pandemic.  She is going to bed late at night, she was sleep late in the day, and spends much of her day in and out of bed, becoming very inactive.  She does not go out of the house much.   The patient believes that her cognitive abilities have declined since that she has been isolated.  She is able to manage her own affairs, she manages her own medications and appointments and keeps up with her finances.  She has not altered any activities of daily living because of memory.  REVIEW OF SYSTEMS: Out of a complete 14 system review of symptoms, the patient complains only of the following symptoms, and all other reviewed systems are negative.  ALLERGIES: Allergies  Allergen Reactions   Morphine Other (See Comments)    REACTION: lethargy/malaise   Metoclopramide Hcl Anxiety and Other (See Comments)    REACTION: anxious and out of her mind   Aricept [Donepezil Hcl]     nausea   Doxycycline Diarrhea   Penicillins Itching and Rash    Has patient had a PCN reaction causing immediate rash, facial/tongue/throat swelling, SOB or lightheadedness with hypotension: No Has patient had a PCN reaction causing severe rash involving mucus membranes or skin necrosis: No Has patient had a PCN reaction that required hospitalization No Has patient had a PCN reaction occurring within the last 10 years: Yes If all of the above answers are "NO", then may proceed with Cephalosporin use.     Prednisone Itching and Rash   Sulfonamide Derivatives Itching and Rash    HOME MEDICATIONS: Outpatient Medications Prior to Visit  Medication Sig Dispense Refill   aspirin 325 MG EC tablet Take 325 mg by  mouth daily.     Biotin 1000 MCG tablet Take 1,000 mcg by mouth 3 (three) times daily.     busPIRone (BUSPAR) 5 MG tablet TAKE 1 TABLET TWICE DAILY 120 tablet 1   Cholecalciferol (VITAMIN D) 50 MCG (2000 UT) CAPS Take by mouth.     COLLAGEN-VITAMIN C PO Take by mouth.     DULoxetine (CYMBALTA) 60 MG capsule TAKE 1 CAPSULE EVERY DAY 90 capsule 0   fluticasone (FLONASE) 50 MCG/ACT nasal spray Place 2 sprays into both nostrils daily as needed for allergies. 16 g 3   Omega-3 Fatty Acids (FISH OIL) 1000 MG CAPS  Take by mouth.     omeprazole (PRILOSEC) 40 MG capsule TAKE 1 CAPSULE TWICE DAILY 180 capsule 0   prednisoLONE acetate (PRED FORTE) 1 % ophthalmic suspension Place 1 drop into both eyes as directed.     No facility-administered medications prior to visit.    PAST MEDICAL HISTORY: Past Medical History:  Diagnosis Date   Acute cystitis    Anxiety    Aphasia    transsient aphasia Jan 2012   Asthmatic bronchitis    Cerebrovascular disease    Chest pain, atypical    Diverticulosis of colon    DJD (degenerative joint disease)    Dysphonia    Esophageal stricture    Fibromyalgia    Gastroparesis    GERD (gastroesophageal reflux disease)    Hematuria, microscopic    Hemorrhoids    Hypercholesteremia    IBS (irritable bowel syndrome)    Memory deficits 02/15/2013   Memory loss    RLS (restless legs syndrome)    TIA (transient ischemic attack)    Urinary incontinence    Vitamin D deficiency     PAST SURGICAL HISTORY: Past Surgical History:  Procedure Laterality Date   ABDOMINAL HYSTERECTOMY  1995   corneal transplant for keratonconus     EYE SURGERY  2013   left foot fracture with ORIF  07/2004   Dr. Sharol Given   right bunion surgery      FAMILY HISTORY: Family History  Problem Relation Age of Onset   Hyperlipidemia Sister    Diabetes Brother    Cancer Brother        lung   Hypertension Mother    Anemia Mother    Dementia Mother    Kidney disease Father    Breast cancer Other        aunt   Diabetes Other        aunt,brother,uncle   Kidney disease Other        father   Dementia Maternal Grandmother     SOCIAL HISTORY: Social History   Socioeconomic History   Marital status: Married    Spouse name: Mallie Mussel   Number of children: 3   Years of education: college-2   Highest education level: Not on file  Occupational History    Employer: RETIRED  Tobacco Use   Smoking status: Never   Smokeless tobacco: Never  Vaping Use   Vaping Use: Never used  Substance and  Sexual Activity   Alcohol use: Yes    Comment: occ   Drug use: No   Sexual activity: Not on file  Other Topics Concern   Not on file  Social History Narrative   Lennie Hummer   [place], daughter   Rhyann Berton info release form to share her medical records with her children   Lives at home w/ her husband   Right-handed   Caffeine:  tea on occasion   Social Determinants of Health   Financial Resource Strain: Low Risk  (07/11/2021)   Overall Financial Resource Strain (CARDIA)    Difficulty of Paying Living Expenses: Not hard at all  Food Insecurity: No Food Insecurity (07/11/2021)   Hunger Vital Sign    Worried About Running Out of Food in the Last Year: Never true    Ran Out of Food in the Last Year: Never true  Transportation Needs: No Transportation Needs (07/11/2021)   PRAPARE - Hydrologist (Medical): No    Lack of Transportation (Non-Medical): No  Physical Activity: Inactive (07/11/2021)   Exercise Vital Sign    Days of Exercise per Week: 0 days    Minutes of Exercise per Session: 0 min  Stress: Stress Concern Present (07/11/2021)   [place]    Feeling of Stress : Rather much  Social Connections: Moderately Integrated (07/11/2021)   Social Connection and Isolation Panel [NHANES]    Frequency of Communication with Friends and Family: More than three times a week    Frequency of Social Gatherings with Friends and Family: Once a week    Attends Religious Services: More than 4 times per year    Active Member of [employer] or Organizations: No    Attends Archivist Meetings: Never    Marital Status: Married  Human resources officer Violence: Not At Risk (07/11/2021)   Humiliation, Afraid, Rape, and Kick questionnaire    Fear of Current or Ex-Partner: No    Emotionally Abused: No    Physically Abused: No    Sexually Abused: No      PHYSICAL  EXAM  There were no vitals filed for this visit.  There is no height or weight on file to calculate BMI.  Generalized: Well developed, in no acute distress   Neurological examination  Mentation: Alert oriented to time, place, history taking. Follows all commands speech and language fluent Cranial nerve II-XII: Pupils were equal round reactive to light. Extraocular movements were full, visual field were full on confrontational test. Facial sensation and strength were normal. Uvula tongue midline. Head turning and shoulder shrug  were normal and symmetric. Motor: The motor testing reveals 5 over 5 strength of all 4 extremities. Good symmetric motor tone is noted throughout.  Sensory: Sensory testing is intact to soft touch on all 4 extremities. Pinprick and vibration decreased in stocking like pattern in bilateral lower extremeties. No evidence of extinction is noted.  Coordination: Cerebellar testing reveals good finger-nose-finger and heel-to-shin bilaterally.  Gait and station: Gait is normal. Tandem gait is normal. Romberg is negative. No drift is seen.  Reflexes: Deep tendon reflexes are symmetric and normal bilaterally.   DIAGNOSTIC DATA (LABS, IMAGING, TESTING) - I reviewed patient records, labs, notes, testing and imaging myself where available.  Lab Results  Component Value Date   WBC 4.6 07/25/2021   HGB 13.2 07/25/2021   HCT 39.7 07/25/2021   MCV 90.6 07/25/2021   PLT 158.0 07/25/2021      Component Value Date/Time   NA 139 07/25/2021 1446   K 3.8 07/25/2021 1446   CL 102 07/25/2021 1446   CO2 30 07/25/2021 1446   GLUCOSE 83 07/25/2021 1446   BUN 20 07/25/2021 1446   CREATININE 0.79 07/25/2021 1446   CREATININE 0.95 (H) 06/04/2016 1534   CALCIUM 9.4 07/25/2021 1446   PROT 7.0 07/25/2021 1446   ALBUMIN 4.1 07/25/2021 1446  AST 21 07/25/2021 1446   ALT 11 07/25/2021 1446   ALKPHOS 83 07/25/2021 1446   BILITOT 0.5 07/25/2021 1446   GFRNONAA 54 (L) 05/07/2015 2203    GFRAA >60 05/07/2015 2203   Lab Results  Component Value Date   CHOL 228 (H) 07/25/2021   HDL 78.10 07/25/2021   LDLCALC 137 (H) 07/25/2021   LDLDIRECT 110.7 03/07/2013   TRIG 65.0 07/25/2021   CHOLHDL 3 07/25/2021   Lab Results  Component Value Date   HGBA1C 5.6 03/21/2015   Lab Results  Component Value Date   VITAMINB12 516 07/25/2021   Lab Results  Component Value Date   TSH 1.30 07/25/2021      ASSESSMENT AND PLAN 81 y.o. year old female  has a past medical history of Acute cystitis, Anxiety, Aphasia, Asthmatic bronchitis, Cerebrovascular disease, Chest pain, atypical, Diverticulosis of colon, DJD (degenerative joint disease), Dysphonia, Esophageal stricture, Fibromyalgia, Gastroparesis, GERD (gastroesophageal reflux disease), Hematuria, microscopic, Hemorrhoids, Hypercholesteremia, IBS (irritable bowel syndrome), Memory deficits (02/15/2013), Memory loss, RLS (restless legs syndrome), TIA (transient ischemic attack), Urinary incontinence, and Vitamin D deficiency. here with   Memory Deficits - No issues at this time, if patient feels her memory worsens, we will consider medications at that time.  Paresthesias - Patient deferred nerve conduction testing at this time, will notify us if she feels her paraesthesias are getting worse to schedule the nerve conduction study. -Follow up in 1 year or sooner if needed    Ward Givens, MSN, NP-C 09/22/2021, 4:05 PM [facility] [address] [place], [place] [address] [phone]

## 2021-09-23 ENCOUNTER — Ambulatory Visit: Payer: Medicare HMO | Admitting: Adult Health

## 2021-09-23 ENCOUNTER — Encounter: Payer: Self-pay | Admitting: Adult Health

## 2021-09-23 VITALS — BP 158/79 | HR 79 | Ht 61.0 in | Wt 118.0 lb

## 2021-09-23 DIAGNOSIS — R413 Other amnesia: Secondary | ICD-10-CM | POA: Diagnosis not present

## 2021-09-23 DIAGNOSIS — R202 Paresthesia of skin: Secondary | ICD-10-CM

## 2021-09-23 NOTE — Telephone Encounter (Signed)
Reviewed message as dictated by Dr. Lamonte Sakai. Pt stated understanding. Nothing further needed at this time.

## 2021-09-24 ENCOUNTER — Ambulatory Visit: Payer: Medicare HMO | Attending: Family Medicine

## 2021-09-24 DIAGNOSIS — R0789 Other chest pain: Secondary | ICD-10-CM | POA: Diagnosis not present

## 2021-09-24 DIAGNOSIS — M6281 Muscle weakness (generalized): Secondary | ICD-10-CM | POA: Diagnosis not present

## 2021-09-24 DIAGNOSIS — R269 Unspecified abnormalities of gait and mobility: Secondary | ICD-10-CM | POA: Diagnosis not present

## 2021-09-24 NOTE — Therapy (Signed)
OUTPATIENT PHYSICAL THERAPY SHOULDER TREATMENT   Patient Name: Paige Pena MRN: [id number] DOB:1940/06/20, 81 y.o., female Today's Date: 09/24/2021   PT End of Session - 09/24/21 1553     Visit Number 7    Date for PT Re-Evaluation 10/21/21    Authorization Type [employer]    PT Start Time 1561    PT Stop Time 5379    PT Time Calculation (min) 39 min    Activity Tolerance Patient tolerated treatment well    Behavior During Therapy WFL for tasks assessed/performed                Past Medical History:  Diagnosis Date   Acute cystitis    Anxiety    Aphasia    transsient aphasia Jan 2012   Asthmatic bronchitis    Cerebrovascular disease    Chest pain, atypical    Diverticulosis of colon    DJD (degenerative joint disease)    Dysphonia    Esophageal stricture    Fibromyalgia    Gastroparesis    GERD (gastroesophageal reflux disease)    Hematuria, microscopic    Hemorrhoids    Hypercholesteremia    IBS (irritable bowel syndrome)    Memory deficits 02/15/2013   Memory loss    RLS (restless legs syndrome)    TIA (transient ischemic attack)    Urinary incontinence    Vitamin D deficiency    Past Surgical History:  Procedure Laterality Date   ABDOMINAL HYSTERECTOMY  1995   corneal transplant for keratonconus     EYE SURGERY  2013   left foot fracture with ORIF  07/2004   Dr. Sharol Given   right bunion surgery     Patient Active Problem List   Diagnosis Date Noted   Pulmonary nodule 09/03/2021   Osteopenia 07/25/2021   Throat irritation 11/28/2019   HLD (hyperlipidemia)    Overactive bladder 05/08/2015   Aphasia    Cervical spondylitis (HCC)    Numbness and tingling 03/21/2015   TIA (transient ischemic attack) 03/21/2015   Physical exam 10/05/2014   Abdominal pain 05/17/2014   Allergic rhinitis 05/17/2014   Fatigue 04/04/2014   Anxiety and depression 07/25/2013   Weight loss 07/25/2013   Memory deficits 02/15/2013   Lumbar spondylosis 09/21/2012    Tachycardia 09/25/2011   Palpitations 09/16/2010   Dysphonia 09/05/2010   Slowing of urinary stream 09/25/2009   Cerebrovascular disease 04/03/2008   DYSPHONIA 04/03/2008   Diverticulosis of large intestine 04/02/2008   Irritable bowel syndrome 04/02/2008   DEGENERATIVE JOINT DISEASE 04/02/2008   HEMATURIA, MICROSCOPIC, HX OF 04/02/2008   Vitamin D deficiency 03/28/2008   Fibromyalgia 03/28/2008   URINARY INCONTINENCE 03/28/2008   GERD 08/02/2007   Gastroparesis 08/02/2007   HEMORRHOIDS 07/30/2007   ESOPHAGEAL STRICTURE 07/30/2007   Anxiety state 07/30/2007    PCP: Annye Asa  REFERRING PROVIDER: Annye Asa  REFERRING DIAG: R07.89  THERAPY DIAG:  Muscle weakness (generalized)  Abnormal gait  Rationale for Evaluation and Treatment Rehabilitation  ONSET DATE: 07/25/21  SUBJECTIVE:  SUBJECTIVE STATEMENT: My chest is all better now, I need to work on my legs and walking.   PERTINENT HISTORY: Fibromyaglia, dysphonia, anxiety, depression, osteopenia  PAIN:  Are you having pain? 0/10  PRECAUTIONS: Fall  WEIGHT BEARING RESTRICTIONS No  FALLS:  Has patient fallen in last 6 months? Yes. Number of falls 1 off a ladder  LIVING ENVIRONMENT: Lives with: lives with their spouse Lives in: House/apartment Stairs: No Has following equipment at home: None  OCCUPATION: retired  PLOF: Independent  PATIENT GOALS to get rid of pain   OBJECTIVE:   DIAGNOSTIC FINDINGS:  Right Chest DG 2/14 IMPRESSION: 1. No displaced right rib fracture identified. 2. No acute cardiopulmonary abnormality.  PATIENT SURVEYS:  Quick Dash 29.5   POSTURE: Forward head, rounded shoulders  UPPER EXTREMITY ROM: WFL  MMT Right eval Left eval  Shoulder flexion (feels a pressure in her chest) 3+ 3+   Shoulder extension    Shoulder abduction 4 4  Shoulder adduction    Shoulder internal rotation 3 4  Shoulder external rotation 4 4  Elbow flexion 4 4  Elbow extension 4 4  Wrist flexion    Wrist extension    Wrist ulnar deviation    Wrist radial deviation    Wrist pronation    Wrist supination    (Blank rows = not tested)  Horizontal ABD 4/5 & Horizontal ADD 4/5 causes some pressure at xiphoid process     TODAY'S TREATMENT:  09/24/21 Nustep L4x62mns Seated rows 15# 2x10 STS w/OHP yellow ball 2x10 Shoulder ext 5# 2x10 Shoulder rows 5# 2x10 Knee flex 20# 2x10  Knee ext 15# 2x10 Leg press 20# x10, 30# x10   09/16/21 Nustep L4 x517ms (UE and LE)  Chest fly 3# 2x10 Shoulder ext 5# 2x10 Resisted gait 20# 4 way x4 each Step ups 6"  x10 each side      09/11/21 Nustep L4 x5m51m (UE and LE) STS with OHP yellow ball 2x10 Knee ext 10# 2x10 Knee flexion 15# 2x10 Seated rows 15# 2x10 Lat pull downs 10# 2x10 Chest press 5# x10  09/09/21 MMT  Grossly 4/5 overall LE strength  5xSTS: 17.86 s TUG 12.52 s FGA: 21/30  Nustep L5 x5mi50m Step ups 6" x10 each side  Seated rows #10 2x10 Lat pull downs 10# 2x10   09/04/21 UBE L1 x6min32mreenTB rows and ext 2x10 Shoulder ER/IR red x10 each side Shoulder flexion 1# WaTe x10  Shoulder horizontal ABD red 2x10 STS with OHP 2x10  08/28/21 NuStep L4 x 6 min  Rows Green 2x10 Shoulder Ext green 2x10 Shoulder ER yellow  2x10 Shoulder Horiz abd yellow 2x10 Seated bilat OHP 3lb 2x10   Eval POC 6MWT- 17 laps   PATIENT EDUCATION: Education details: POC Person educated: Patient Education method: Explanation Education comprehension: verbalized understanding   HOME EXERCISE PROGRAM: Access Code: [email or web address]  Exercises - Single Arm Doorway Pec Stretch at 90 Degrees Abduction  - 2 x daily - 7 x weekly - 30 hold - Standing Shoulder Horizontal Abduction with Resistance  - 1 x daily - 7 x weekly - 3 sets - 10  reps  ASSESSMENT:  CLINICAL IMPRESSION: Patient is doing well, states that her chest pain has gotten a lot better. Wants to continue working on both chest and strengthening of legs. Tolerates session well without any complaints.    OBJECTIVE IMPAIRMENTS decreased strength and pain.   ACTIVITY LIMITATIONS carrying, lifting, sleeping, and pushing, pulling  PERSONAL FACTORS Age, Behavior pattern, Time  since onset of injury/illness/exacerbation, and 3+ comorbidities: CVD, Fibromyalgia, GERD, TIA  are also affecting patient's functional outcome.   REHAB POTENTIAL: Fair if her origin is [place]  CLINICAL DECISION MAKING: Evolving/moderate complexity  EVALUATION COMPLEXITY: Moderate   GOALS: Goals reviewed with patient? No  SHORT TERM GOALS: Target date: 09/12/21  Patient will be independent with initial HEP.  Goal status: MET   LONG TERM GOALS: Target date: 10/21/21  Patient will demonstrate improved functional UE strength as demonstrated by >=4+/5 in muscle groups. Goal status: progressing  2.  Patient will report 15 or lower on QUICKDASH to demonstrate improved functional ability.  Goal status: INITIAL  3. Patient will report a decrease in chest pain on NPRS from 8/10 to 5/10 or better.   Baseline: 8/10, 8/1-5/10 Goal status: MET   4. Patient will demonstrate <12s on TUG and 5xSTS to decrease fall risk. Baseline: 12.52s, 17.86s Goal status: INITIAL   5. Patient will score 25 or higher on FGA to demonstrate improved functional gait to ambulate within the community safely.   Baseline: 21/30   Goal status: INITIAL   PLAN: PT FREQUENCY: 1-2x/week  PT DURATION: 8 weeks  PLANNED INTERVENTIONS: Therapeutic exercises, Therapeutic activity, Neuromuscular re-education, Balance training, Gait training, Patient/Family education, Joint mobilization, Cryotherapy, Moist heat, Ionotophoresis 52m/ml Dexamethasone, and Manual therapy  PLAN FOR NEXT SESSION: review HEP, start with UE/pec  strengthening, monitor chest pain, LE assessment and adding in new gait referral    MAndris Baumann PT 09/24/2021, 4:30 PM

## 2021-09-25 ENCOUNTER — Ambulatory Visit: Payer: Medicare HMO

## 2021-09-26 ENCOUNTER — Ambulatory Visit: Payer: Medicare HMO

## 2021-09-26 DIAGNOSIS — R269 Unspecified abnormalities of gait and mobility: Secondary | ICD-10-CM

## 2021-09-26 DIAGNOSIS — R0789 Other chest pain: Secondary | ICD-10-CM | POA: Diagnosis not present

## 2021-09-26 DIAGNOSIS — M6281 Muscle weakness (generalized): Secondary | ICD-10-CM | POA: Diagnosis not present

## 2021-09-26 NOTE — Therapy (Signed)
OUTPATIENT PHYSICAL THERAPY SHOULDER TREATMENT   Patient Name: Paige Pena MRN: [id number] DOB:1940/08/20, 81 y.o., female Today's Date: 09/26/2021   PT End of Session - 09/26/21 1509     Visit Number 8    Date for PT Re-Evaluation 10/21/21    Authorization Type [employer]    PT Start Time 1500    PT Stop Time 8657    PT Time Calculation (min) 45 min    Activity Tolerance Patient tolerated treatment well    Behavior During Therapy [facility] for tasks assessed/performed                Past Medical History:  Diagnosis Date   Acute cystitis    Anxiety    Aphasia    transsient aphasia Jan 2012   Asthmatic bronchitis    Cerebrovascular disease    Chest pain, atypical    Diverticulosis of colon    DJD (degenerative joint disease)    Dysphonia    Esophageal stricture    Fibromyalgia    Gastroparesis    GERD (gastroesophageal reflux disease)    Hematuria, microscopic    Hemorrhoids    Hypercholesteremia    IBS (irritable bowel syndrome)    Memory deficits 02/15/2013   Memory loss    RLS (restless legs syndrome)    TIA (transient ischemic attack)    Urinary incontinence    Vitamin D deficiency    Past Surgical History:  Procedure Laterality Date   ABDOMINAL HYSTERECTOMY  1995   corneal transplant for keratonconus     EYE SURGERY  2013   left foot fracture with ORIF  07/2004   Dr. Sharol Given   right bunion surgery     Patient Active Problem List   Diagnosis Date Noted   Pulmonary nodule 09/03/2021   Osteopenia 07/25/2021   Throat irritation 11/28/2019   HLD (hyperlipidemia)    Overactive bladder 05/08/2015   Aphasia    Cervical spondylitis (HCC)    Numbness and tingling 03/21/2015   TIA (transient ischemic attack) 03/21/2015   Physical exam 10/05/2014   Abdominal pain 05/17/2014   Allergic rhinitis 05/17/2014   Fatigue 04/04/2014   Anxiety and depression 07/25/2013   Weight loss 07/25/2013   Memory deficits 02/15/2013   Lumbar spondylosis 09/21/2012    Tachycardia 09/25/2011   Palpitations 09/16/2010   Dysphonia 09/05/2010   Slowing of urinary stream 09/25/2009   Cerebrovascular disease 04/03/2008   DYSPHONIA 04/03/2008   Diverticulosis of large intestine 04/02/2008   Irritable bowel syndrome 04/02/2008   DEGENERATIVE JOINT DISEASE 04/02/2008   HEMATURIA, MICROSCOPIC, HX OF 04/02/2008   Vitamin D deficiency 03/28/2008   Fibromyalgia 03/28/2008   URINARY INCONTINENCE 03/28/2008   GERD 08/02/2007   Gastroparesis 08/02/2007   HEMORRHOIDS 07/30/2007   ESOPHAGEAL STRICTURE 07/30/2007   Anxiety state 07/30/2007    PCP: Annye Asa  REFERRING PROVIDER: Annye Asa  REFERRING DIAG: R07.89  THERAPY DIAG:  Abnormal gait  Muscle weakness (generalized)  Rationale for Evaluation and Treatment Rehabilitation  ONSET DATE: 07/25/21  SUBJECTIVE:  SUBJECTIVE STATEMENT: Chest pain is some better, my right leg is feeling a little numb. I stumble over steps sometimes because my R leg is numb.   PERTINENT HISTORY: Fibromyaglia, dysphonia, anxiety, depression, osteopenia  PAIN:  Are you having pain? 0/10  PRECAUTIONS: Fall  WEIGHT BEARING RESTRICTIONS No  FALLS:  Has patient fallen in last 6 months? Yes. Number of falls 1 off a ladder  LIVING ENVIRONMENT: Lives with: lives with their spouse Lives in: House/apartment Stairs: No Has following equipment at home: None  OCCUPATION: retired  PLOF: Independent  PATIENT GOALS to get rid of pain   OBJECTIVE:   DIAGNOSTIC FINDINGS:  Right Chest DG 2/14 IMPRESSION: 1. No displaced right rib fracture identified. 2. No acute cardiopulmonary abnormality.  PATIENT SURVEYS:  Quick Dash 29.5   POSTURE: Forward head, rounded shoulders  UPPER EXTREMITY ROM: WFL  MMT Right eval Left eval   Shoulder flexion (feels a pressure in her chest) 3+ 3+  Shoulder extension    Shoulder abduction 4 4  Shoulder adduction    Shoulder internal rotation 3 4  Shoulder external rotation 4 4  Elbow flexion 4 4  Elbow extension 4 4  Wrist flexion    Wrist extension    Wrist ulnar deviation    Wrist radial deviation    Wrist pronation    Wrist supination    (Blank rows = not tested)  Horizontal ABD 4/5 & Horizontal ADD 4/5 causes some pressure at xiphoid process     TODAY'S TREATMENT:  09/26/21 Nustep L5 x33mns Lat pull down 15  # 2x10 Chest press 10# 2x10 Resisted gait 20# 3 way x3  Step ups 6" x10 each side  Walking on balance beam forwards and side steps x3  09/24/21 Nustep L4x545ms Seated rows 15# 2x10 STS w/OHP yellow ball 2x10 Shoulder ext 5# 2x10 Shoulder rows 5# 2x10 Knee flex 20# 2x10  Knee ext 15# 2x10 Leg press 20# x10, 30# x10     09/16/21 Nustep L4 x5m54m (UE and LE)  Chest fly 3# 2x10 Shoulder ext 5# 2x10 Resisted gait 20# 4 way x4 each Step ups 6"  x10 each side   09/09/21 MMT  Grossly 4/5 overall LE strength  5xSTS: 17.86 s TUG 12.52 s FGA: 21/30  Nustep L5 x5mi20m Step ups 6" x10 each side  Seated rows #10 2x10 Lat pull downs 10# 2x10   09/04/21 UBE L1 x6min61mreenTB rows and ext 2x10 Shoulder ER/IR red x10 each side Shoulder flexion 1# WaTe x10  Shoulder horizontal ABD red 2x10 STS with OHP 2x10  08/28/21 NuStep L4 x 6 min  Rows Green 2x10 Shoulder Ext green 2x10 Shoulder ER yellow  2x10 Shoulder Horiz abd yellow 2x10 Seated bilat OHP 3lb 2x10   Eval POC 6MWT- 17 laps   PATIENT EDUCATION: Education details: POC Person educated: Patient Education method: Explanation Education comprehension: verbalized understanding   HOME EXERCISE PROGRAM: Access Code: [email or web address]  Exercises - Single Arm Doorway Pec Stretch at 90 Degrees Abduction  - 2 x daily - 7 x weekly - 30 hold - Standing Shoulder Horizontal Abduction with Resistance   - 1 x daily - 7 x weekly - 3 sets - 10 reps  ASSESSMENT:  CLINICAL IMPRESSION: Patient arrives after a funeral and needs time to change to get ready for session. We continued to work on gait and strengthening. Still doing some UE and chest exercises to maintain strength and help prevent any further chest pain. Some LOB with resisted gait  especially with side steps but able to regain balance. Most difficulty with narrow walking on beam due to unsteadiness and states "it is difficulty but I need this."   OBJECTIVE IMPAIRMENTS decreased strength and pain.   ACTIVITY LIMITATIONS carrying, lifting, sleeping, and pushing, pulling  PERSONAL FACTORS Age, Behavior pattern, Time since onset of injury/illness/exacerbation, and 3+ comorbidities: CVD, Fibromyalgia, GERD, TIA  are also affecting patient's functional outcome.   REHAB POTENTIAL: Fair if her origin is MSK  CLINICAL DECISION MAKING: Evolving/moderate complexity  EVALUATION COMPLEXITY: Moderate   GOALS: Goals reviewed with patient? No  SHORT TERM GOALS: Target date: 09/12/21  Patient will be independent with initial HEP.  Goal status: INITIAL   LONG TERM GOALS: Target date: 10/21/21  Patient will demonstrate improved functional UE strength as demonstrated by >=4+/5 in muscle groups. Goal status: INITIAL  2.  Patient will report 15 or lower on QUICKDASH to demonstrate improved functional ability.  Goal status: INITIAL  3. Patient will report a decrease in chest pain on NPRS from 8/10 to 5/10 or better.   Baseline: 8/10 Goal status: INITIAL   4. Patient will demonstrate <12s on TUG and 5xSTS to decrease fall risk. Baseline: 12.52s, 17.86s Goal status: INITIAL   5. Patient will score 25 or higher on FGA to demonstrate improved functional gait to ambulate within the community safely.   Baseline: 21/30   Goal status: INITIAL   PLAN: PT FREQUENCY: 1-2x/week  PT DURATION: 8 weeks  PLANNED INTERVENTIONS: Therapeutic  exercises, Therapeutic activity, Neuromuscular re-education, Balance training, Gait training, Patient/Family education, Joint mobilization, Cryotherapy, Moist heat, Ionotophoresis '4mg'$ /ml Dexamethasone, and Manual therapy  PLAN FOR NEXT SESSION: LE strengthening, balance training    Andris Baumann, PT 09/26/2021, 3:53 PM

## 2021-09-27 ENCOUNTER — Other Ambulatory Visit: Payer: Self-pay | Admitting: Family Medicine

## 2021-09-27 DIAGNOSIS — F419 Anxiety disorder, unspecified: Secondary | ICD-10-CM

## 2021-10-03 ENCOUNTER — Encounter: Payer: Self-pay | Admitting: Orthopedic Surgery

## 2021-10-03 ENCOUNTER — Ambulatory Visit (INDEPENDENT_AMBULATORY_CARE_PROVIDER_SITE_OTHER): Payer: Medicare HMO | Admitting: Orthopedic Surgery

## 2021-10-03 VITALS — BP 128/86 | HR 85 | Ht 61.0 in | Wt 120.4 lb

## 2021-10-03 DIAGNOSIS — R1310 Dysphagia, unspecified: Secondary | ICD-10-CM | POA: Diagnosis not present

## 2021-10-03 DIAGNOSIS — M21612 Bunion of left foot: Secondary | ICD-10-CM | POA: Diagnosis not present

## 2021-10-03 DIAGNOSIS — K219 Gastro-esophageal reflux disease without esophagitis: Secondary | ICD-10-CM

## 2021-10-03 DIAGNOSIS — E785 Hyperlipidemia, unspecified: Secondary | ICD-10-CM | POA: Diagnosis not present

## 2021-10-03 DIAGNOSIS — M797 Fibromyalgia: Secondary | ICD-10-CM

## 2021-10-03 DIAGNOSIS — R911 Solitary pulmonary nodule: Secondary | ICD-10-CM | POA: Diagnosis not present

## 2021-10-03 DIAGNOSIS — R269 Unspecified abnormalities of gait and mobility: Secondary | ICD-10-CM

## 2021-10-03 DIAGNOSIS — R413 Other amnesia: Secondary | ICD-10-CM

## 2021-10-03 DIAGNOSIS — G459 Transient cerebral ischemic attack, unspecified: Secondary | ICD-10-CM

## 2021-10-03 DIAGNOSIS — F32A Depression, unspecified: Secondary | ICD-10-CM

## 2021-10-03 DIAGNOSIS — F419 Anxiety disorder, unspecified: Secondary | ICD-10-CM | POA: Diagnosis not present

## 2021-10-03 DIAGNOSIS — R202 Paresthesia of skin: Secondary | ICD-10-CM | POA: Diagnosis not present

## 2021-10-03 NOTE — Patient Instructions (Addendum)
Try taking bigger pills with apple sauce or pudding, can try swallowing chin to chest   Referral to podiatry made due to bunions

## 2021-10-03 NOTE — Progress Notes (Signed)
Careteam: Patient Care Team: [name], MD as PCP - General (Family Medicine) Druscilla Brownie, MD as Consulting Physician (Dermatology) Marilynne Halsted, MD as Referring Physician (Ophthalmology) Ward Givens, NP as Nurse Practitioner (Neurology) Robley Fries, MD as Consulting Physician (Urology)  Seen by: Windell Moulding, AGNP-C  PLACE OF SERVICE:  [place]  Advanced Directive information    Allergies  Allergen Reactions   Morphine Other (See Comments)    REACTION: lethargy/malaise   Metoclopramide Hcl Anxiety and Other (See Comments)    REACTION: anxious and out of her mind   Aricept [Donepezil Hcl]     nausea   Doxycycline Diarrhea   Penicillins Itching and Rash    Has patient had a PCN reaction causing immediate rash, facial/tongue/throat swelling, SOB or lightheadedness with hypotension: No Has patient had a PCN reaction causing severe rash involving mucus membranes or skin necrosis: No Has patient had a PCN reaction that required hospitalization No Has patient had a PCN reaction occurring within the last 10 years: Yes If all of the above answers are "NO", then may proceed with Cephalosporin use.     Prednisone Itching and Rash   Sulfonamide Derivatives Itching and Rash    No chief complaint on file.    HPI: Patient is a 81 y.o. female seen today to establish at [facility].   Previous PCP was Dr. Annye Asa. Referred by friends. From [place], [place]. Retired at age 81. Worked for [employer]. Married. 3 adult children- all live out of state. Lives in 1 story house, 2 stairs to get in house.   Denies HTN, diabetes or cancers.   HLD- total cholesterol 228, LDL 137 07/25/2021, never tried statin, admits to eating low fat diet Paresthesias to neck and left foot- evaluated by neurology 08/2021, plans to see Dr. Krista Blue and have nerve conduction study, numbness intermittent, affects ambulation and she sometimes stumbles,  working with PT 2x/week, no recent falls H/o TIA- 2017/ 2013, remains on full dose EC aspirin daily GERD- followed by GI at [facility], avoids food triggers, remains on omeprazole Lung nodule- 09/07/2021 CT chest noted 4 mm nodule to left lower lobe- no routine follow up recommended Dysphagia- barium swallow unremarkable, sometimes has trouble swallowing larger pills Abnormal gait- see above Fibromyalgia- generalized pain > 10 years, remains on Cymbalta Anxiety/depression- denies mood changes, remains on Buspar Left bunion- followed by Dr. Sharol Given in past, increased pain x 1 month, requesting podiatry referral.  Memory impairment- MMSE 30/30 09/23/2021, reports feeling forgetful  No recent hospitalizations or injuries.   Cataract surgery at end of month.   Colonoscopy- aged out Mammogram- 03/06/2021- normal, goes to [facility]- 2014, normal, remains on vitamin D  Family hx: Father deceased at age 45, kidney failure Mother deceased in 68's, dementia Sister age 57, does not know Sister age 67, diabetes Brother deceased in 65's, lung cancer Maternal grandmother- dementia  Smoking: never smoked Drinking: does not drink Drugs: no past use or abuse  Diet: tries to eat 2 meals daily, occasional soda, eats at restaurant 2x/week, eats fruit/veg daily Exercise: no scheduled exercise regimen  Does not have advanced directives    Review of Systems:  Review of Systems  Constitutional:  Positive for malaise/fatigue. Negative for chills, fever and weight loss.  HENT:  Negative for hearing loss and sore throat.   Eyes:  Negative for blurred vision and double vision.  Respiratory:  Negative for cough, shortness of breath and wheezing.   Cardiovascular:  Negative for chest pain and leg swelling.  Gastrointestinal:  Positive for heartburn. Negative for abdominal pain, blood in stool, constipation, diarrhea, nausea and vomiting.  Genitourinary:  Negative for dysuria and hematuria.  Musculoskeletal:   Positive for falls, joint pain and myalgias.  Skin: Negative.   Neurological:  Positive for sensory change. Negative for dizziness, weakness and headaches.  Psychiatric/Behavioral:  Positive for depression and memory loss. The patient is nervous/anxious. The patient does not have insomnia.     Past Medical History:  Diagnosis Date   Acute cystitis    Anxiety    Aphasia    transsient aphasia Jan 2012   Asthmatic bronchitis    Cerebrovascular disease    Chest pain, atypical    Diverticulosis of colon    DJD (degenerative joint disease)    Dysphonia    Esophageal stricture    Fibromyalgia    Gastroparesis    GERD (gastroesophageal reflux disease)    Hematuria, microscopic    Hemorrhoids    Hypercholesteremia    IBS (irritable bowel syndrome)    Memory deficits 02/15/2013   Memory loss    RLS (restless legs syndrome)    TIA (transient ischemic attack)    Urinary incontinence    Vitamin D deficiency    Past Surgical History:  Procedure Laterality Date   ABDOMINAL HYSTERECTOMY  1995   corneal transplant for keratonconus     EYE SURGERY  2013   left foot fracture with ORIF  07/2004   Dr. Sharol Given   right bunion surgery     Social History:   reports that she has never smoked. She has never used smokeless tobacco. She reports current alcohol use. She reports that she does not use drugs.  Family History  Problem Relation Age of Onset   Hypertension Mother    Anemia Mother    Dementia Mother    Kidney disease Father    Hyperlipidemia Sister    Diabetes Brother    Lung cancer Brother    Dementia Maternal Grandmother    Diabetes Other        aunt,brother,uncle   Breast cancer Maternal Aunt     Medications: Patient's Medications  New Prescriptions   No medications on file  Previous Medications   ASPIRIN 325 MG EC TABLET    Take 325 mg by mouth daily.   BIOTIN 1000 MCG TABLET    Take 1,000 mcg by mouth 3 (three) times daily.   BUSPIRONE (BUSPAR) 5 MG TABLET    TAKE 1  TABLET TWICE DAILY   CHOLECALCIFEROL (VITAMIN D) 50 MCG (2000 UT) CAPS    Take by mouth.   COLLAGEN-VITAMIN C PO    Take by mouth.   DULOXETINE (CYMBALTA) 60 MG CAPSULE    TAKE 1 CAPSULE EVERY DAY   FLUTICASONE (FLONASE) 50 MCG/ACT NASAL SPRAY    Place 2 sprays into both nostrils daily as needed for allergies.   OMEGA-3 FATTY ACIDS (FISH OIL) 1000 MG CAPS    Take by mouth.   OMEPRAZOLE (PRILOSEC) 40 MG CAPSULE    TAKE 1 CAPSULE TWICE DAILY   PREDNISOLONE ACETATE (PRED FORTE) 1 % OPHTHALMIC SUSPENSION    Place 1 drop into both eyes as directed.  Modified Medications   No medications on file  Discontinued Medications   No medications on file    Physical Exam:  Vitals:   10/03/21 1036  BP: 128/86  Pulse: 85  SpO2: 98%  Weight: 120 lb 6.4 oz (54.6 kg)  Height:  $'5\' 1"'o$  (1.549 m)   Body mass index is 22.75 kg/m. Wt Readings from Last 3 Encounters:  10/03/21 120 lb 6.4 oz (54.6 kg)  09/23/21 118 lb (53.5 kg)  09/03/21 120 lb 9.6 oz (54.7 kg)    Physical Exam Vitals reviewed.  Constitutional:      General: She is not in acute distress. HENT:     Head: Normocephalic.  Eyes:     General:        Right eye: No discharge.        Left eye: No discharge.  Cardiovascular:     Rate and Rhythm: Normal rate and regular rhythm.     Pulses: Normal pulses.     Heart sounds: Normal heart sounds.  Pulmonary:     Effort: Pulmonary effort is normal. No respiratory distress.     Breath sounds: Normal breath sounds. No wheezing.  Abdominal:     General: Bowel sounds are normal. There is no distension.     Palpations: Abdomen is soft.     Tenderness: There is no abdominal tenderness.  Musculoskeletal:     Cervical back: Neck supple.     Right lower leg: No edema.     Left lower leg: No edema.  Lymphadenopathy:     Cervical: No cervical adenopathy.  Skin:    General: Skin is warm and dry.     Capillary Refill: Capillary refill takes less than 2 seconds.  Neurological:     General: No  focal deficit present.     Mental Status: She is alert and oriented to person, place, and time.  Psychiatric:        Mood and Affect: Mood normal.        Behavior: Behavior normal.     Labs reviewed: Basic Metabolic Panel: Recent Labs    01/31/21 1522 07/25/21 1446  NA 139 139  K 4.0 3.8  CL 101 102  CO2 31 30  GLUCOSE 78 83  BUN 16 20  CREATININE 0.86 0.79  CALCIUM 9.4 9.4  TSH 2.13 1.30   Liver Function Tests: Recent Labs    01/31/21 1522 07/25/21 1446  AST 27 21  ALT 13 11  ALKPHOS 71 83  BILITOT 0.4 0.5  PROT 6.5 7.0  ALBUMIN 3.9 4.1   No results for input(s): "LIPASE", "AMYLASE" in the last 8760 hours. No results for input(s): "AMMONIA" in the last 8760 hours. CBC: Recent Labs    01/31/21 1522 07/25/21 1446  WBC 3.7* 4.6  NEUTROABS 2.1 3.2  HGB 12.7 13.2  HCT 37.9 39.7  MCV 90.6 90.6  PLT 151.0 158.0   Lipid Panel: Recent Labs    01/31/21 1522 07/25/21 1446  CHOL 214* 228*  HDL 80.90 78.10  LDLCALC 120* 137*  TRIG 63.0 65.0  CHOLHDL 3 3   TSH: Recent Labs    01/31/21 1522 07/25/21 1446  TSH 2.13 1.30   A1C: Lab Results  Component Value Date   HGBA1C 5.6 03/21/2015     Assessment/Plan 1. Bunion of left foot - ongoing - increased pain x 1 month - evaluated by Dr. Sharol Given in past - H/o ORIF left foot 2006, right bunion removal - Ambulatory referral to Podiatry  2. Hyperlipidemia, unspecified hyperlipidemia type - total cholesterol 228, LDL 137 07/25/2021 - not on medication - limit processed and friends foods in diet  3. Paresthesia of left foot - ongoing - evaluated by neurology - nerve conduction study recommended - cont PT for falls prevention  4.  TIA (transient ischemic attack) - noted 2017, 2013 - cont full dose aspirin  5. Gastroesophageal reflux disease without esophagitis - hgb stable - [facility] - cont omeprazole  6. Lung nodule - 09/07/2021 CT chest noted 4 mm nodule to left lower lobe- no  routine follow up recommended  7. Dysphagia, unspecified type - 07/07 barium swallow norma - trouble swallowing large pills - advised to take medication with applesauce and tuck chin  8. Abnormal gait - see above - cont PT  9. Anxiety and depression - no mood changes - cont Cymbalta and Buspar  10. Memory deficits - MMSE 30/30 07/31  11. Fibromyalgia - diagnosed > 10 years ago - cont Cymbalta  Total time: 52 minutes. Greater than 50% of total time spent doing patient education regarding health history, symptom/medication management.     Next appt: 11/14/2021  Windell Moulding, [facility] [phone]

## 2021-10-07 ENCOUNTER — Encounter: Payer: Self-pay | Admitting: Physical Therapy

## 2021-10-07 ENCOUNTER — Ambulatory Visit: Payer: Medicare HMO | Admitting: Physical Therapy

## 2021-10-07 DIAGNOSIS — M6281 Muscle weakness (generalized): Secondary | ICD-10-CM | POA: Diagnosis not present

## 2021-10-07 DIAGNOSIS — R0789 Other chest pain: Secondary | ICD-10-CM | POA: Diagnosis not present

## 2021-10-07 DIAGNOSIS — R269 Unspecified abnormalities of gait and mobility: Secondary | ICD-10-CM

## 2021-10-07 NOTE — Therapy (Signed)
OUTPATIENT PHYSICAL THERAPY SHOULDER TREATMENT   Patient Name: Paige Pena MRN: [id number] DOB:[birth year], 81 y.o., female Today's Date: 10/07/2021   PT End of Session - 10/07/21 1530     Visit Number 9    Date for PT Re-Evaluation 10/21/21    PT Start Time 42    PT Stop Time 1600    PT Time Calculation (min) 32 min    Activity Tolerance Patient tolerated treatment well    Behavior During Therapy [facility] for tasks assessed/performed                Past Medical History:  Diagnosis Date   Acute cystitis    Anxiety    Aphasia    transsient aphasia Jan 2012   Asthmatic bronchitis    Cerebrovascular disease    Chest pain, atypical    Diverticulosis of colon    DJD (degenerative joint disease)    Dysphonia    Esophageal stricture    Fibromyalgia    Gastroparesis    GERD (gastroesophageal reflux disease)    Hematuria, microscopic    Hemorrhoids    Hypercholesteremia    IBS (irritable bowel syndrome)    Memory deficits 02/15/2013   Memory loss    RLS (restless legs syndrome)    TIA (transient ischemic attack)    Urinary incontinence    Vitamin D deficiency    Past Surgical History:  Procedure Laterality Date   ABDOMINAL HYSTERECTOMY  1995   corneal transplant for keratonconus     EYE SURGERY  2013   left foot fracture with ORIF  07/2004   Dr. Sharol Given   right bunion surgery     Patient Active Problem List   Diagnosis Date Noted   Pulmonary nodule 09/03/2021   Osteopenia 07/25/2021   Throat irritation 11/28/2019   HLD (hyperlipidemia)    Overactive bladder 05/08/2015   Aphasia    Cervical spondylitis (HCC)    Numbness and tingling 03/21/2015   TIA (transient ischemic attack) 03/21/2015   Physical exam 10/05/2014   Abdominal pain 05/17/2014   Allergic rhinitis 05/17/2014   Fatigue 04/04/2014   Anxiety and depression 07/25/2013   Weight loss 07/25/2013   Memory deficits 02/15/2013   Lumbar spondylosis 09/21/2012   Tachycardia 09/25/2011    Palpitations 09/16/2010   Dysphonia 09/05/2010   Slowing of urinary stream 09/25/2009   Cerebrovascular disease 04/03/2008   DYSPHONIA 04/03/2008   Diverticulosis of large intestine 04/02/2008   Irritable bowel syndrome 04/02/2008   DEGENERATIVE JOINT DISEASE 04/02/2008   HEMATURIA, MICROSCOPIC, HX OF 04/02/2008   Vitamin D deficiency 03/28/2008   Fibromyalgia 03/28/2008   URINARY INCONTINENCE 03/28/2008   GERD 08/02/2007   Gastroparesis 08/02/2007   HEMORRHOIDS 07/30/2007   ESOPHAGEAL STRICTURE 07/30/2007   Anxiety state 07/30/2007    PCP: Annye Asa  REFERRING PROVIDER: Annye Asa  REFERRING DIAG: R07.89  THERAPY DIAG:  Abnormal gait  Muscle weakness (generalized)  Other chest pain  Rationale for Evaluation and Treatment Rehabilitation  ONSET DATE: 07/25/21  SUBJECTIVE:  SUBJECTIVE STATEMENT: "I feel pretty good"   PERTINENT HISTORY: Fibromyaglia, dysphonia, anxiety, depression, osteopenia  PAIN:  Are you having pain? 0/10  PRECAUTIONS: Fall  WEIGHT BEARING RESTRICTIONS No  FALLS:  Has patient fallen in last 6 months? Yes. Number of falls 1 off a ladder  LIVING ENVIRONMENT: Lives with: lives with their spouse Lives in: House/apartment Stairs: No Has following equipment at home: None  OCCUPATION: retired  PLOF: Independent  PATIENT GOALS to get rid of pain   OBJECTIVE:   DIAGNOSTIC FINDINGS:  Right Chest DG 2/14 IMPRESSION: 1. No displaced right rib fracture identified. 2. No acute cardiopulmonary abnormality.  PATIENT SURVEYS:  Quick Dash 29.5   POSTURE: Forward head, rounded shoulders  UPPER EXTREMITY ROM: WFL  MMT Right eval Left eval  Shoulder flexion (feels a pressure in her chest) 3+ 3+  Shoulder extension    Shoulder abduction 4 4   Shoulder adduction    Shoulder internal rotation 3 4  Shoulder external rotation 4 4  Elbow flexion 4 4  Elbow extension 4 4  Wrist flexion    Wrist extension    Wrist ulnar deviation    Wrist radial deviation    Wrist pronation    Wrist supination    (Blank rows = not tested)  Horizontal ABD 4/5 & Horizontal ADD 4/5 causes some pressure at xiphoid process     TODAY'S TREATMENT:  10/07/21 NuStep L3 x 6 min  Resisted gait 30# forward/backward, 20lb side step x3 each  Rows & lats 15lb 2x0    09/26/21 Nustep L5 x66mns Lat pull down 15  # 2x10 Chest press 10# 2x10 Resisted gait 20# 3 way x3  Step ups 6" x10 each side  Walking on balance beam forwards and side steps x3  09/24/21 Nustep L4x59ms Seated rows 15# 2x10 STS w/OHP yellow ball 2x10 Shoulder ext 5# 2x10 Shoulder rows 5# 2x10 Knee flex 20# 2x10  Knee ext 15# 2x10 Leg press 20# x10, 30# x10     09/16/21 Nustep L4 x5m69m (UE and LE)  Chest fly 3# 2x10 Shoulder ext 5# 2x10 Resisted gait 20# 4 way x4 each Step ups 6"  x10 each side   09/09/21 MMT  Grossly 4/5 overall LE strength  5xSTS: 17.86 s TUG 12.52 s FGA: 21/30  Nustep L5 x5mi63m Step ups 6" x10 each side  Seated rows #10 2x10 Lat pull downs 10# 2x10   09/04/21 UBE L1 x6min23mreenTB rows and ext 2x10 Shoulder ER/IR red x10 each side Shoulder flexion 1# WaTe x10  Shoulder horizontal ABD red 2x10 STS with OHP 2x10  08/28/21 NuStep L4 x 6 min  Rows Green 2x10 Shoulder Ext green 2x10 Shoulder ER yellow  2x10 Shoulder Horiz abd yellow 2x10 Seated bilat OHP 3lb 2x10   Eval POC 6MWT- 17 laps   PATIENT EDUCATION: Education details: POC Person educated: Patient Education method: Explanation Education comprehension: verbalized understanding   HOME EXERCISE PROGRAM: Access Code: [email or web address]  Exercises - Single Arm Doorway Pec Stretch at 90 Degrees Abduction  - 2 x daily - 7 x weekly - 30 hold - Standing Shoulder Horizontal Abduction with  Resistance  - 1 x daily - 7 x weekly - 3 sets - 10 reps  ASSESSMENT:  CLINICAL IMPRESSION: Pt ~ 14 minutes late for today's session. Pt able to tolerated increase resistance with forward and backwards resisted gait. Tactile cues needed to prevent postural sway with seated rows.    OBJECTIVE IMPAIRMENTS decreased strength and pain.  ACTIVITY LIMITATIONS carrying, lifting, sleeping, and pushing, pulling  PERSONAL FACTORS Age, Behavior pattern, Time since onset of injury/illness/exacerbation, and 3+ comorbidities: CVD, Fibromyalgia, GERD, TIA  are also affecting patient's functional outcome.   REHAB POTENTIAL: Fair if her origin is [place]  CLINICAL DECISION MAKING: Evolving/moderate complexity  EVALUATION COMPLEXITY: Moderate   GOALS: Goals reviewed with patient? No  SHORT TERM GOALS: Target date: 09/12/21  Patient will be independent with initial HEP.  Goal status: Met   LONG TERM GOALS: Target date: 10/21/21  Patient will demonstrate improved functional UE strength as demonstrated by >=4+/5 in muscle groups. Goal status: INITIAL  2.  Patient will report 15 or lower on QUICKDASH to demonstrate improved functional ability.  Goal status: INITIAL  3. Patient will report a decrease in chest pain on NPRS from 8/10 to 5/10 or better.   Baseline: 8/10 Goal status: INITIAL   4. Patient will demonstrate <12s on TUG and 5xSTS to decrease fall Pena. Baseline: 12.52s, 17.86s Goal status: INITIAL   5. Patient will score 25 or higher on FGA to demonstrate improved functional gait to ambulate within the community safely.   Baseline: 21/30   Goal status: INITIAL   PLAN: PT FREQUENCY: 1-2x/week  PT DURATION: 8 weeks  PLANNED INTERVENTIONS: Therapeutic exercises, Therapeutic activity, Neuromuscular re-education, Balance training, Gait training, Patient/Family education, Joint mobilization, Cryotherapy, Moist heat, Ionotophoresis 25m/ml Dexamethasone, and Manual therapy  PLAN FOR  NEXT SESSION: LE strengthening, balance training    RScot Jun PTA 10/07/2021, 3:31 PM

## 2021-10-09 ENCOUNTER — Encounter: Payer: Self-pay | Admitting: Physical Therapy

## 2021-10-09 ENCOUNTER — Ambulatory Visit: Payer: Medicare HMO | Admitting: Physical Therapy

## 2021-10-09 DIAGNOSIS — R0789 Other chest pain: Secondary | ICD-10-CM | POA: Diagnosis not present

## 2021-10-09 DIAGNOSIS — R269 Unspecified abnormalities of gait and mobility: Secondary | ICD-10-CM

## 2021-10-09 DIAGNOSIS — M6281 Muscle weakness (generalized): Secondary | ICD-10-CM | POA: Diagnosis not present

## 2021-10-09 NOTE — Therapy (Signed)
OUTPATIENT PHYSICAL THERAPY SHOULDER TREATMENT  Progress Note Reporting Period 08/22/21 to 10/09/21  See note below for Objective Data and Assessment of Progress/Goals.     Patient Name: Paige Pena MRN: [id number] DOB:1940/09/09, 81 y.o., female Today's Date: 10/09/2021   PT End of Session - 10/09/21 1358     Visit Number 10    Date for PT Re-Evaluation 10/21/21    PT Start Time 1351    PT Stop Time 1430    PT Time Calculation (min) 39 min    Activity Tolerance Patient tolerated treatment well    Behavior During Therapy [facility] for tasks assessed/performed                Past Medical History:  Diagnosis Date   Acute cystitis    Anxiety    Aphasia    transsient aphasia Jan 2012   Asthmatic bronchitis    Cerebrovascular disease    Chest pain, atypical    Diverticulosis of colon    DJD (degenerative joint disease)    Dysphonia    Esophageal stricture    Fibromyalgia    Gastroparesis    GERD (gastroesophageal reflux disease)    Hematuria, microscopic    Hemorrhoids    Hypercholesteremia    IBS (irritable bowel syndrome)    Memory deficits 02/15/2013   Memory loss    RLS (restless legs syndrome)    TIA (transient ischemic attack)    Urinary incontinence    Vitamin D deficiency    Past Surgical History:  Procedure Laterality Date   ABDOMINAL HYSTERECTOMY  1995   corneal transplant for keratonconus     EYE SURGERY  2013   left foot fracture with ORIF  07/2004   Dr. Sharol Given   right bunion surgery     Patient Active Problem List   Diagnosis Date Noted   Pulmonary nodule 09/03/2021   Osteopenia 07/25/2021   Throat irritation 11/28/2019   HLD (hyperlipidemia)    Overactive bladder 05/08/2015   Aphasia    Cervical spondylitis (HCC)    Numbness and tingling 03/21/2015   TIA (transient ischemic attack) 03/21/2015   Physical exam 10/05/2014   Abdominal pain 05/17/2014   Allergic rhinitis 05/17/2014   Fatigue 04/04/2014   Anxiety and depression 07/25/2013    Weight loss 07/25/2013   Memory deficits 02/15/2013   Lumbar spondylosis 09/21/2012   Tachycardia 09/25/2011   Palpitations 09/16/2010   Dysphonia 09/05/2010   Slowing of urinary stream 09/25/2009   Cerebrovascular disease 04/03/2008   DYSPHONIA 04/03/2008   Diverticulosis of large intestine 04/02/2008   Irritable bowel syndrome 04/02/2008   DEGENERATIVE JOINT DISEASE 04/02/2008   HEMATURIA, MICROSCOPIC, HX OF 04/02/2008   Vitamin D deficiency 03/28/2008   Fibromyalgia 03/28/2008   URINARY INCONTINENCE 03/28/2008   GERD 08/02/2007   Gastroparesis 08/02/2007   HEMORRHOIDS 07/30/2007   ESOPHAGEAL STRICTURE 07/30/2007   Anxiety state 07/30/2007    PCP: Annye Asa  REFERRING PROVIDER: Annye Asa  REFERRING DIAG: R07.89  THERAPY DIAG:  Abnormal gait  Other chest pain  Muscle weakness (generalized)  Rationale for Evaluation and Treatment Rehabilitation  ONSET DATE: 07/25/21  SUBJECTIVE:  SUBJECTIVE STATEMENT: "I feel pretty good, L leg feel weak and heavy"  PERTINENT HISTORY: Fibromyaglia, dysphonia, anxiety, depression, osteopenia  PAIN:  Are you having pain? 0/10  PRECAUTIONS: Fall  WEIGHT BEARING RESTRICTIONS No  FALLS:  Has patient fallen in last 6 months? Yes. Number of falls 1 off a ladder  LIVING ENVIRONMENT: Lives with: lives with their spouse Lives in: House/apartment Stairs: No Has following equipment at home: None  OCCUPATION: retired  PLOF: Independent  PATIENT GOALS to get rid of pain   OBJECTIVE:   DIAGNOSTIC FINDINGS:  Right Chest DG 2/14 IMPRESSION: 1. No displaced right rib fracture identified. 2. No acute cardiopulmonary abnormality.  PATIENT SURVEYS:  Quick Dash 29.5   POSTURE: Forward head, rounded shoulders  UPPER EXTREMITY  ROM: WFL  MMT Right eval Left eval  Shoulder flexion (feels a pressure in her chest) 3+ 3+  Shoulder extension    Shoulder abduction 4 4  Shoulder adduction    Shoulder internal rotation 3 4  Shoulder external rotation 4 4  Elbow flexion 4 4  Elbow extension 4 4  Wrist flexion    Wrist extension    Wrist ulnar deviation    Wrist radial deviation    Wrist pronation    Wrist supination    (Blank rows = not tested)  Horizontal ABD 4/5 & Horizontal ADD 4/5 causes some pressure at xiphoid process     TODAY'S TREATMENT:  10/09/21 NuStep L4 x 5 min Sit to stands holdign yellow ball 2x10 Hamstring curls 20lb 2x10, LLE 10lb 2x5 Leg Extensions 10lb 2x10, LLE 5lb 2x5 Side step on an off airex Shoulder rows and ext red on airex 2x10   10/07/21 NuStep L3 x 6 min  Resisted gait 30# forward/backward, 20lb side step x3 each  Rows & lats 15lb 2x0    09/26/21 Nustep L5 x50mns Lat pull down 15  # 2x10 Chest press 10# 2x10 Resisted gait 20# 3 way x3  Step ups 6" x10 each side  Walking on balance beam forwards and side steps x3  09/24/21 Nustep L4x542ms Seated rows 15# 2x10 STS w/OHP yellow ball 2x10 Shoulder ext 5# 2x10 Shoulder rows 5# 2x10 Knee flex 20# 2x10  Knee ext 15# 2x10 Leg press 20# x10, 30# x10     09/16/21 Nustep L4 x5m74m (UE and LE)  Chest fly 3# 2x10 Shoulder ext 5# 2x10 Resisted gait 20# 4 way x4 each Step ups 6"  x10 each side   09/09/21 MMT  Grossly 4/5 overall LE strength  5xSTS: 17.86 s TUG 12.52 s FGA: 21/30  Nustep L5 x5mi75m Step ups 6" x10 each side  Seated rows #10 2x10 Lat pull downs 10# 2x10   PATIENT EDUCATION: Education details: POC Person educated: Patient Education method: Explanation Education comprehension: verbalized understanding   HOME EXERCISE PROGRAM: Access Code: [email or web address]  Exercises - Single Arm Doorway Pec Stretch at 90 Degrees Abduction  - 2 x daily - 7 x weekly - 30 hold - Standing Shoulder Horizontal Abduction  with Resistance  - 1 x daily - 7 x weekly - 3 sets - 10 reps  ASSESSMENT:  CLINICAL IMPRESSION: Pt ~ 6 minutes late for today's session. Pt has progressed decreasing her TUG and 5 times sit to stand time meeting goal. Pt reports that's he would like to emphases her LLE strength so it was isolated during session. No repots of increase pain. Some LE fatigue with curls and extensions.  OBJECTIVE IMPAIRMENTS decreased strength and pain.  ACTIVITY LIMITATIONS carrying, lifting, sleeping, and pushing, pulling  PERSONAL FACTORS Age, Behavior pattern, Time since onset of injury/illness/exacerbation, and 3+ comorbidities: CVD, Fibromyalgia, GERD, TIA  are also affecting patient's functional outcome.   REHAB POTENTIAL: Fair if her origin is [place]  CLINICAL DECISION MAKING: Evolving/moderate complexity  EVALUATION COMPLEXITY: Moderate   GOALS: Goals reviewed with patient? No  SHORT TERM GOALS: Target date: 09/12/21  Patient will be independent with initial HEP.  Goal status: Met   LONG TERM GOALS: Target date: 10/21/21  Patient will demonstrate improved functional UE strength as demonstrated by >=4+/5 in muscle groups. Goal status: INITIAL  2.  Patient will report 15 or lower on QUICKDASH to demonstrate improved functional ability.  Goal status: INITIAL  3. Patient will report a decrease in chest pain on NPRS from 8/10 to 5/10 or better.   Baseline: 8/10 Goal status: Met    4. Patient will demonstrate <12s on TUG and 5xSTS to decrease fall risk. Baseline: 10.27s, 11.90s Goal status: Met  10/09/21  5. Patient will score 25 or higher on FGA to demonstrate improved functional gait to ambulate within the community safely.   Baseline: 21/30   Goal status: INITIAL   PLAN: PT FREQUENCY: 1-2x/week  PT DURATION: 8 weeks  PLANNED INTERVENTIONS: Therapeutic exercises, Therapeutic activity, Neuromuscular re-education, Balance training, Gait training, Patient/Family education, Joint  mobilization, Cryotherapy, Moist heat, Ionotophoresis 80m/ml Dexamethasone, and Manual therapy  PLAN FOR NEXT SESSION: LE strengthening, balance training    RScot Jun PTA 10/09/2021, 1:59 PM

## 2021-10-14 ENCOUNTER — Ambulatory Visit: Payer: Medicare HMO | Admitting: Physical Therapy

## 2021-10-14 ENCOUNTER — Encounter: Payer: Self-pay | Admitting: Physical Therapy

## 2021-10-14 DIAGNOSIS — M6281 Muscle weakness (generalized): Secondary | ICD-10-CM | POA: Diagnosis not present

## 2021-10-14 DIAGNOSIS — R269 Unspecified abnormalities of gait and mobility: Secondary | ICD-10-CM

## 2021-10-14 DIAGNOSIS — R0789 Other chest pain: Secondary | ICD-10-CM | POA: Diagnosis not present

## 2021-10-14 NOTE — Therapy (Signed)
OUTPATIENT PHYSICAL THERAPY SHOULDER TREATMENT     Patient Name: Paige Pena MRN: [id number] DOB:[birth year], 81 y.o., female Today's Date: 10/14/2021   PT End of Session - 10/14/21 1349     Visit Number 11    Date for PT Re-Evaluation 10/21/21    Authorization Type [employer]    PT Start Time 1330   arrived late   PT Stop Time 82    PT Time Calculation (min) 27 min    Activity Tolerance Patient tolerated treatment well    Behavior During Therapy [facility] for tasks assessed/performed                 Past Medical History:  Diagnosis Date   Acute cystitis    Anxiety    Aphasia    transsient aphasia Jan 2012   Asthmatic bronchitis    Cerebrovascular disease    Chest pain, atypical    Diverticulosis of colon    DJD (degenerative joint disease)    Dysphonia    Esophageal stricture    Fibromyalgia    Gastroparesis    GERD (gastroesophageal reflux disease)    Hematuria, microscopic    Hemorrhoids    Hypercholesteremia    IBS (irritable bowel syndrome)    Memory deficits 02/15/2013   Memory loss    RLS (restless legs syndrome)    TIA (transient ischemic attack)    Urinary incontinence    Vitamin D deficiency    Past Surgical History:  Procedure Laterality Date   ABDOMINAL HYSTERECTOMY  1995   corneal transplant for keratonconus     EYE SURGERY  2013   left foot fracture with ORIF  07/2004   Dr. Sharol Given   right bunion surgery     Patient Active Problem List   Diagnosis Date Noted   Pulmonary nodule 09/03/2021   Osteopenia 07/25/2021   Throat irritation 11/28/2019   HLD (hyperlipidemia)    Overactive bladder 05/08/2015   Aphasia    Cervical spondylitis (HCC)    Numbness and tingling 03/21/2015   TIA (transient ischemic attack) 03/21/2015   Physical exam 10/05/2014   Abdominal pain 05/17/2014   Allergic rhinitis 05/17/2014   Fatigue 04/04/2014   Anxiety and depression 07/25/2013   Weight loss 07/25/2013   Memory deficits 02/15/2013   Lumbar  spondylosis 09/21/2012   Tachycardia 09/25/2011   Palpitations 09/16/2010   Dysphonia 09/05/2010   Slowing of urinary stream 09/25/2009   Cerebrovascular disease 04/03/2008   DYSPHONIA 04/03/2008   Diverticulosis of large intestine 04/02/2008   Irritable bowel syndrome 04/02/2008   DEGENERATIVE JOINT DISEASE 04/02/2008   HEMATURIA, MICROSCOPIC, HX OF 04/02/2008   Vitamin D deficiency 03/28/2008   Fibromyalgia 03/28/2008   URINARY INCONTINENCE 03/28/2008   GERD 08/02/2007   Gastroparesis 08/02/2007   HEMORRHOIDS 07/30/2007   ESOPHAGEAL STRICTURE 07/30/2007   Anxiety state 07/30/2007    PCP: Annye Asa  REFERRING PROVIDER: Annye Asa  REFERRING DIAG: R07.89  THERAPY DIAG:  Abnormal gait - Plan: PT plan of care cert/re-cert  Other chest pain - Plan: PT plan of care cert/re-cert  Muscle weakness (generalized) - Plan: PT plan of care cert/re-cert  Rationale for Evaluation and Treatment Rehabilitation  ONSET DATE: 07/25/21  SUBJECTIVE:  SUBJECTIVE STATEMENT:  I'm tired today I know I'm late. The chest is feeling better, the leg is a problem its just weak with a bit of numbness and tingling. No falls recently.   PERTINENT HISTORY: Fibromyaglia, dysphonia, anxiety, depression, osteopenia  PAIN:  Are you having pain? 0/10, more numb than anything   PRECAUTIONS: Fall  WEIGHT BEARING RESTRICTIONS No  FALLS:  Has patient fallen in last 6 months? Yes. Number of falls 1 off a ladder  LIVING ENVIRONMENT: Lives with: lives with their spouse Lives in: House/apartment Stairs: No Has following equipment at home: None  OCCUPATION: retired  PLOF: Independent  PATIENT GOALS to get rid of pain   OBJECTIVE:   DIAGNOSTIC FINDINGS:  Right Chest DG 2/14 IMPRESSION: 1. No displaced  right rib fracture identified. 2. No acute cardiopulmonary abnormality.  PATIENT SURVEYS:  Quick Dash 29.5   POSTURE: Forward head, rounded shoulders  UPPER EXTREMITY ROM: [facility]  MMT Right eval Left eval 8/21 R 8/21 L   Shoulder flexion (feels a pressure in her chest) 3+ 3+ 4+ 4+  Shoulder extension      Shoulder abduction 4 4 4+ 4+  Shoulder adduction      Shoulder internal rotation 3 4 4+ 4+  Shoulder external rotation 4 4 3+ 3+  Elbow flexion 4 4 4+ 4+  Elbow extension 4 4 3+ 3+  Wrist flexion      Wrist extension      Wrist ulnar deviation      Wrist radial deviation      Wrist pronation      Wrist supination      (Blank rows = not tested)  Horizontal ABD 4/5 & Horizontal ADD 4/5 causes some pressure at xiphoid process  BLE strength: Knee extension: R 4+ L 4 Knee flexion: R 4+ L 4+ Hip flexion: R 4 L 4 Seated hip ABD: 3/5 B  Seated hip extension: 3+/5     TODAY'S TREATMENT:   10/14/21  Bridges 1x15  Sidelying clams 1x10 B red TB   Objective measures and appropriate education   10/09/21 NuStep L4 x 5 min Sit to stands holdign yellow ball 2x10 Hamstring curls 20lb 2x10, LLE 10lb 2x5 Leg Extensions 10lb 2x10, LLE 5lb 2x5 Side step on an off airex Shoulder rows and ext red on airex 2x10   10/07/21 NuStep L3 x 6 min  Resisted gait 30# forward/backward, 20lb side step x3 each  Rows & lats 15lb 2x0    09/26/21 Nustep L5 x67mns Lat pull down 15  # 2x10 Chest press 10# 2x10 Resisted gait 20# 3 way x3  Step ups 6" x10 each side  Walking on balance beam forwards and side steps x3  09/24/21 Nustep L4x570ms Seated rows 15# 2x10 STS w/OHP yellow ball 2x10 Shoulder ext 5# 2x10 Shoulder rows 5# 2x10 Knee flex 20# 2x10  Knee ext 15# 2x10 Leg press 20# x10, 30# x10     09/16/21 Nustep L4 x5m57m (UE and LE)  Chest fly 3# 2x10 Shoulder ext 5# 2x10 Resisted gait 20# 4 way x4 each Step ups 6"  x10 each side   09/09/21 MMT  Grossly 4/5 overall LE  strength  5xSTS: 17.86 s TUG 12.52 s FGA: 21/30  Nustep L5 x5mi47m Step ups 6" x10 each side  Seated rows #10 2x10 Lat pull downs 10# 2x10   PATIENT EDUCATION: Education details: POC Person educated: Patient Education method: Explanation Education comprehension: verbalized understanding   HOME EXERCISE PROGRAM: Access Code: [email or web address]  Exercises - Single Arm Doorway Pec Stretch at 90 Degrees Abduction  - 2 x daily - 7 x weekly - 30 hold - Standing Shoulder Horizontal Abduction with Resistance  - 1 x daily - 7 x weekly - 3 sets - 10 reps  ASSESSMENT:  CLINICAL IMPRESSION:  Kelleigh arrives late today; we had to get objective measures to renew cert for MD. Definitely making progress (see objective measures), recommend continuing with PT as insurance allows. Focused on LE strength per her request and per low objectives on mobility testing. Will continue to progress as able and tolerate moving forward.   OBJECTIVE IMPAIRMENTS decreased strength and pain.   ACTIVITY LIMITATIONS carrying, lifting, sleeping, and pushing, pulling  PERSONAL FACTORS Age, Behavior pattern, Time since onset of injury/illness/exacerbation, and 3+ comorbidities: CVD, Fibromyalgia, GERD, TIA  are also affecting patient's functional outcome.   REHAB POTENTIAL: Fair if her origin is [place]  CLINICAL DECISION MAKING: Evolving/moderate complexity  EVALUATION COMPLEXITY: Moderate   GOALS: Goals reviewed with patient? No  SHORT TERM GOALS: Target date: 09/12/21  Patient will be independent with initial HEP.  Goal status: not met not doing any exercises "they never gave me one"    LONG TERM GOALS: Target date: 10/21/21  Patient will demonstrate improved functional UE strength as demonstrated by >=4+/5 in muscle groups. Goal status: improving   2.  Patient will report 15 or lower on QUICKDASH to demonstrate improved functional ability.  Goal status: 8/21 45  3. Patient will report a decrease in chest pain  on NPRS from 8/10 to 5/10 or better.   Baseline: 8/10 Goal status: Met    4. Patient will demonstrate <12s on TUG and 5xSTS to decrease fall risk. Baseline: 8/21- 17 5xSTS, TUG 9 seconds  Goal status: Met  10/09/21  5. Patient will score 25 or higher on FGA to demonstrate improved functional gait to ambulate within the community safely.   Baseline: 21/30   Goal status: ONGOING 8/21 not tested    PLAN: PT FREQUENCY: 1-2x/week  PT DURATION: 6 weeks  PLANNED INTERVENTIONS: Therapeutic exercises, Therapeutic activity, Neuromuscular re-education, Balance training, Gait training, Patient/Family education, Joint mobilization, Cryotherapy, Moist heat, Ionotophoresis 10m/ml Dexamethasone, and Manual therapy  PLAN FOR NEXT SESSION: LE strengthening, balance training    Ahmyah Gidley U PT DPT PN2  10/14/2021, 1:59 PM

## 2021-10-15 ENCOUNTER — Telehealth: Payer: Self-pay | Admitting: *Deleted

## 2021-10-15 ENCOUNTER — Telehealth: Payer: Self-pay | Admitting: Neurology

## 2021-10-15 DIAGNOSIS — H2513 Age-related nuclear cataract, bilateral: Secondary | ICD-10-CM | POA: Diagnosis not present

## 2021-10-15 NOTE — Telephone Encounter (Signed)
Please make sure next appointment is with Dr. Krista Blue in 2 to 3 months for an evaluation

## 2021-10-15 NOTE — Telephone Encounter (Signed)
I called the pt and LVM (ok per DPR) with message from Dr Krista Blue stating she has reviewed the patient's chart. Giving patient a few months before next follow-up will allow enough time for symptoms/signs to develop. She will see Dr Krista Blue first before a decision is made about EMG/NCV. Left office number in message and asked pt to call us back asap to schedule an evaluation with Dr Krista Blue for the next 2-3 months out.   When the patient calls back, please give her this message above and schedule her with Dr Krista Blue in the next 2-3 months.

## 2021-10-15 NOTE — Telephone Encounter (Signed)
I see the pt has been scheduled with Dr Krista Blue for 12/24/21 at 10:30 AM.

## 2021-10-15 NOTE — Telephone Encounter (Signed)
I have reviewed her chart, was last seen by Jinny Blossom on September 23, 2021, complains of memory loss, also paresthesia, crawling sensation on her scalp,  Previous EMG nerve conduction study by Dr. Jannifer Franklin 2013 was normal  Examination in recent visit showed no significant abnormality  Please reassure patient, given her few months before next follow-up will allow enough time for the symptoms/signs to develop, I will see her first before make decision for EMG nerve conduction study

## 2021-10-15 NOTE — Therapy (Signed)
OUTPATIENT PHYSICAL THERAPY SHOULDER TREATMENT     Patient Name: Paige Pena MRN: [id number] DOB:1940/04/24, 81 y.o., female Today's Date: 10/15/2021         Past Medical History:  Diagnosis Date   Acute cystitis    Anxiety    Aphasia    transsient aphasia Jan 2012   Asthmatic bronchitis    Cerebrovascular disease    Chest pain, atypical    Diverticulosis of colon    DJD (degenerative joint disease)    Dysphonia    Esophageal stricture    Fibromyalgia    Gastroparesis    GERD (gastroesophageal reflux disease)    Hematuria, microscopic    Hemorrhoids    Hypercholesteremia    IBS (irritable bowel syndrome)    Memory deficits 02/15/2013   Memory loss    RLS (restless legs syndrome)    TIA (transient ischemic attack)    Urinary incontinence    Vitamin D deficiency    Past Surgical History:  Procedure Laterality Date   ABDOMINAL HYSTERECTOMY  1995   corneal transplant for keratonconus     EYE SURGERY  2013   left foot fracture with ORIF  07/2004   Dr. Sharol Pena   right bunion surgery     Patient Active Problem List   Diagnosis Date Noted   Pulmonary nodule 09/03/2021   Osteopenia 07/25/2021   Throat irritation 11/28/2019   HLD (hyperlipidemia)    Overactive bladder 05/08/2015   Aphasia    Cervical spondylitis (HCC)    Numbness and tingling 03/21/2015   TIA (transient ischemic attack) 03/21/2015   Physical exam 10/05/2014   Abdominal pain 05/17/2014   Allergic rhinitis 05/17/2014   Fatigue 04/04/2014   Anxiety and depression 07/25/2013   Weight loss 07/25/2013   Memory deficits 02/15/2013   Lumbar spondylosis 09/21/2012   Tachycardia 09/25/2011   Palpitations 09/16/2010   Dysphonia 09/05/2010   Slowing of urinary stream 09/25/2009   Cerebrovascular disease 04/03/2008   DYSPHONIA 04/03/2008   Diverticulosis of large intestine 04/02/2008   Irritable bowel syndrome 04/02/2008   DEGENERATIVE JOINT DISEASE 04/02/2008   HEMATURIA, MICROSCOPIC, HX OF  04/02/2008   Vitamin D deficiency 03/28/2008   Fibromyalgia 03/28/2008   URINARY INCONTINENCE 03/28/2008   GERD 08/02/2007   Gastroparesis 08/02/2007   HEMORRHOIDS 07/30/2007   ESOPHAGEAL STRICTURE 07/30/2007   Anxiety state 07/30/2007    PCP: Paige Pena  REFERRING PROVIDER: Annye Pena  REFERRING DIAG: R07.89  THERAPY DIAG:  No diagnosis found.  Rationale for Evaluation and Treatment Rehabilitation  ONSET DATE: 07/25/21  SUBJECTIVE:  SUBJECTIVE STATEMENT:  I'm tired today I know I'm late. The chest is feeling better, the leg is a problem its just weak with a bit of numbness and tingling. No falls recently.   PERTINENT HISTORY: Fibromyaglia, dysphonia, anxiety, depression, osteopenia  PAIN:  Are you having pain? 0/10, more numb than anything   PRECAUTIONS: Fall  WEIGHT BEARING RESTRICTIONS No  FALLS:  Has patient fallen in last 6 months? Yes. Number of falls 1 off a ladder  LIVING ENVIRONMENT: Lives with: lives with their spouse Lives in: House/apartment Stairs: No Has following equipment at home: None  OCCUPATION: retired  PLOF: Independent  PATIENT GOALS to get rid of pain   OBJECTIVE:   DIAGNOSTIC FINDINGS:  Right Chest DG 2/14 IMPRESSION: 1. No displaced right rib fracture identified. 2. No acute cardiopulmonary abnormality.  PATIENT SURVEYS:  Quick Dash 29.5   POSTURE: Forward head, rounded shoulders  UPPER EXTREMITY ROM: [employer]  MMT Right eval Left eval 8/21 R 8/21 L   Shoulder flexion (feels a pressure in her chest) 3+ 3+ 4+ 4+  Shoulder extension      Shoulder abduction 4 4 4+ 4+  Shoulder adduction      Shoulder internal rotation 3 4 4+ 4+  Shoulder external rotation 4 4 3+ 3+  Elbow flexion 4 4 4+ 4+  Elbow extension 4 4 3+ 3+  Wrist flexion       Wrist extension      Wrist ulnar deviation      Wrist radial deviation      Wrist pronation      Wrist supination      (Blank rows = not tested)  Horizontal ABD 4/5 & Horizontal ADD 4/5 causes some pressure at xiphoid process  BLE strength: Knee extension: R 4+ L 4 Knee flexion: R 4+ L 4+ Hip flexion: R 4 L 4 Seated hip ABD: 3/5 B  Seated hip extension: 3+/5     TODAY'S TREATMENT:  10/16/21 Nustep Leg ext HS curls Leg press STS  Resisted gait   10/14/21  Bridges 1x15  Sidelying clams 1x10 B red TB   Objective measures and appropriate education   10/09/21 NuStep L4 x 5 min Sit to stands holdign yellow ball 2x10 Hamstring curls 20lb 2x10, LLE 10lb 2x5 Leg Extensions 10lb 2x10, LLE 5lb 2x5 Side step on an off airex Shoulder rows and ext red on airex 2x10   10/07/21 NuStep L3 x 6 min  Resisted gait 30# forward/backward, 20lb side step x3 each  Rows & lats 15lb 2x0    09/26/21 Nustep L5 x56mns Lat pull down 15  # 2x10 Chest press 10# 2x10 Resisted gait 20# 3 way x3  Step ups 6" x10 each side  Walking on balance beam forwards and side steps x3  09/24/21 Nustep L4x570ms Seated rows 15# 2x10 STS w/OHP yellow ball 2x10 Shoulder ext 5# 2x10 Shoulder rows 5# 2x10 Knee flex 20# 2x10  Knee ext 15# 2x10 Leg press 20# x10, 30# x10     09/16/21 Nustep L4 x5m4m (UE and LE)  Chest fly 3# 2x10 Shoulder ext 5# 2x10 Resisted gait 20# 4 way x4 each Step ups 6"  x10 each side   09/09/21 MMT  Grossly 4/5 overall LE strength  5xSTS: 17.86 s TUG 12.52 s FGA: 21/30  Nustep L5 x5mi31m Step ups 6" x10 each side  Seated rows #10 2x10 Lat pull downs 10# 2x10   PATIENT EDUCATION: Education details: POC Person educated: Patient Education method: Explanation  Education comprehension: verbalized understanding   HOME EXERCISE PROGRAM: Access Code: [email or web address]  Exercises - Single Arm Doorway Pec Stretch at 90 Degrees Abduction  - 2 x daily - 7 x weekly - 30  hold - Standing Shoulder Horizontal Abduction with Resistance  - 1 x daily - 7 x weekly - 3 sets - 10 reps  ASSESSMENT:  CLINICAL IMPRESSION:  Paige Pena arrives late today; we had to get objective measures to renew cert for MD. Definitely making progress (see objective measures), recommend continuing with PT as insurance allows. Focused on LE strength per her request and per low objectives on mobility testing. Will continue to progress as able and tolerate moving forward.   OBJECTIVE IMPAIRMENTS decreased strength and pain.   ACTIVITY LIMITATIONS carrying, lifting, sleeping, and pushing, pulling  PERSONAL FACTORS Age, Behavior pattern, Time since onset of injury/illness/exacerbation, and 3+ comorbidities: CVD, Fibromyalgia, GERD, TIA  are also affecting patient's functional outcome.   REHAB POTENTIAL: Fair if her origin is [facility]  CLINICAL DECISION MAKING: Evolving/moderate complexity  EVALUATION COMPLEXITY: Moderate   GOALS: Goals reviewed with patient? No  SHORT TERM GOALS: Target date: 09/12/21  Patient will be independent with initial HEP.  Goal status: not met not doing any exercises "they never gave me one"    LONG TERM GOALS: Target date: 10/21/21  Patient will demonstrate improved functional UE strength as demonstrated by >=4+/5 in muscle groups. Goal status: improving   2.  Patient will report 15 or lower on QUICKDASH to demonstrate improved functional ability.  Goal status: 8/21 45  3. Patient will report a decrease in chest pain on NPRS from 8/10 to 5/10 or better.   Baseline: 8/10 Goal status: Met    4. Patient will demonstrate <12s on TUG and 5xSTS to decrease fall risk. Baseline: 8/21- 17 5xSTS, TUG 9 seconds  Goal status: Met  10/09/21  5. Patient will score 25 or higher on FGA to demonstrate improved functional gait to ambulate within the community safely.   Baseline: 21/30   Goal status: ONGOING 8/21 not tested    PLAN: PT FREQUENCY: 1-2x/week  PT  DURATION: 6 weeks  PLANNED INTERVENTIONS: Therapeutic exercises, Therapeutic activity, Neuromuscular re-education, Balance training, Gait training, Patient/Family education, Joint mobilization, Cryotherapy, Moist heat, Ionotophoresis 80m/ml Dexamethasone, and Manual therapy  PLAN FOR NEXT SESSION: LE strengthening, balance training    Kristen U PT DPT PN2  10/15/2021, 11:36 AM

## 2021-10-15 NOTE — Telephone Encounter (Signed)
Patient is calling to check on an upcoming appointment but had the wrong office, directed to Dr Windell Moulding were the appointment is scheduled.

## 2021-10-16 ENCOUNTER — Ambulatory Visit: Payer: Medicare HMO

## 2021-10-16 DIAGNOSIS — R269 Unspecified abnormalities of gait and mobility: Secondary | ICD-10-CM

## 2021-10-16 DIAGNOSIS — R0789 Other chest pain: Secondary | ICD-10-CM

## 2021-10-16 DIAGNOSIS — M6281 Muscle weakness (generalized): Secondary | ICD-10-CM

## 2021-10-18 ENCOUNTER — Other Ambulatory Visit: Payer: Self-pay | Admitting: Orthopedic Surgery

## 2021-10-18 ENCOUNTER — Telehealth: Payer: Self-pay

## 2021-10-18 DIAGNOSIS — R269 Unspecified abnormalities of gait and mobility: Secondary | ICD-10-CM

## 2021-10-18 NOTE — Telephone Encounter (Signed)
New referral made

## 2021-10-18 NOTE — Telephone Encounter (Signed)
Patient needs referral for physical therapy for leg and chest. She states that old referral was from previous provider and is about to expire and needs new one from current provider.  Message routed to Windell Moulding, NP

## 2021-10-21 ENCOUNTER — Ambulatory Visit: Payer: Medicare HMO

## 2021-10-21 DIAGNOSIS — M6281 Muscle weakness (generalized): Secondary | ICD-10-CM | POA: Diagnosis not present

## 2021-10-21 DIAGNOSIS — R269 Unspecified abnormalities of gait and mobility: Secondary | ICD-10-CM | POA: Diagnosis not present

## 2021-10-21 DIAGNOSIS — R0789 Other chest pain: Secondary | ICD-10-CM | POA: Diagnosis not present

## 2021-10-21 NOTE — Therapy (Signed)
OUTPATIENT PHYSICAL THERAPY TREATMENT     Patient Name: Paige Pena MRN: [id number] DOB:Nov 21, 1940, 81 y.o., female Today's Date: 10/21/2021   PT End of Session - 10/21/21 1536     Visit Number --    Number of Visits --    Date for PT Re-Evaluation --    Authorization Type [employer] 5/12    PT Start Time 59                  Past Medical History:  Diagnosis Date   Acute cystitis    Anxiety    Aphasia    transsient aphasia Jan 2012   Asthmatic bronchitis    Cerebrovascular disease    Chest pain, atypical    Diverticulosis of colon    DJD (degenerative joint disease)    Dysphonia    Esophageal stricture    Fibromyalgia    Gastroparesis    GERD (gastroesophageal reflux disease)    Hematuria, microscopic    Hemorrhoids    Hypercholesteremia    IBS (irritable bowel syndrome)    Memory deficits 02/15/2013   Memory loss    RLS (restless legs syndrome)    TIA (transient ischemic attack)    Urinary incontinence    Vitamin D deficiency    Past Surgical History:  Procedure Laterality Date   ABDOMINAL HYSTERECTOMY  1995   corneal transplant for keratonconus     EYE SURGERY  2013   left foot fracture with ORIF  07/2004   Dr. Sharol Given   right bunion surgery     Patient Active Problem List   Diagnosis Date Noted   Pulmonary nodule 09/03/2021   Osteopenia 07/25/2021   Throat irritation 11/28/2019   HLD (hyperlipidemia)    Overactive bladder 05/08/2015   Aphasia    Cervical spondylitis (HCC)    Numbness and tingling 03/21/2015   TIA (transient ischemic attack) 03/21/2015   Physical exam 10/05/2014   Abdominal pain 05/17/2014   Allergic rhinitis 05/17/2014   Fatigue 04/04/2014   Anxiety and depression 07/25/2013   Weight loss 07/25/2013   Memory deficits 02/15/2013   Lumbar spondylosis 09/21/2012   Tachycardia 09/25/2011   Palpitations 09/16/2010   Dysphonia 09/05/2010   Slowing of urinary stream 09/25/2009   Cerebrovascular disease 04/03/2008    DYSPHONIA 04/03/2008   Diverticulosis of large intestine 04/02/2008   Irritable bowel syndrome 04/02/2008   DEGENERATIVE JOINT DISEASE 04/02/2008   HEMATURIA, MICROSCOPIC, HX OF 04/02/2008   Vitamin D deficiency 03/28/2008   Fibromyalgia 03/28/2008   URINARY INCONTINENCE 03/28/2008   GERD 08/02/2007   Gastroparesis 08/02/2007   HEMORRHOIDS 07/30/2007   ESOPHAGEAL STRICTURE 07/30/2007   Anxiety state 07/30/2007    PCP: Annye Asa  REFERRING PROVIDER: Annye Asa  REFERRING DIAG: R07.89  THERAPY DIAG:  No diagnosis found.  Rationale for Evaluation and Treatment Rehabilitation  ONSET DATE: 07/25/21  SUBJECTIVE:  SUBJECTIVE STATEMENT: I am doing fine. My left leg is giving me trouble, it is weak.   PERTINENT HISTORY: Fibromyaglia, dysphonia, anxiety, depression, osteopenia  PAIN:  Are you having pain? 0/10, more numb than anything   PRECAUTIONS: Fall  WEIGHT BEARING RESTRICTIONS No  FALLS:  Has patient fallen in last 6 months? Yes. Number of falls 1 off a ladder  LIVING ENVIRONMENT: Lives with: lives with their spouse Lives in: House/apartment Stairs: No Has following equipment at home: None  OCCUPATION: retired  PLOF: Independent  PATIENT GOALS to get rid of pain   OBJECTIVE:   DIAGNOSTIC FINDINGS:  Right Chest DG 2/14 IMPRESSION: 1. No displaced right rib fracture identified. 2. No acute cardiopulmonary abnormality.  PATIENT SURVEYS:  Quick Dash 29.5   POSTURE: Forward head, rounded shoulders  UPPER EXTREMITY ROM: [facility]  MMT Right eval Left eval 8/21 R 8/21 L   Shoulder flexion (feels a pressure in her chest) 3+ 3+ 4+ 4+  Shoulder extension      Shoulder abduction 4 4 4+ 4+  Shoulder adduction      Shoulder internal rotation 3 4 4+ 4+  Shoulder  external rotation 4 4 3+ 3+  Elbow flexion 4 4 4+ 4+  Elbow extension 4 4 3+ 3+  Wrist flexion      Wrist extension      Wrist ulnar deviation      Wrist radial deviation      Wrist pronation      Wrist supination      (Blank rows = not tested)  Horizontal ABD 4/5 & Horizontal ADD 4/5 causes some pressure at xiphoid process  BLE strength: Knee extension: R 4+ L 4 Knee flexion: R 4+ L 4+ Hip flexion: R 4 L 4 Seated hip ABD: 3/5 B  Seated hip extension: 3+/5     TODAY'S TREATMENT:  10/21/21 Nustep L5 x27mns  Step up on airex on 6" Side steps on airex  SL tapping cones  Resisted gait 15#  4 way x4   10/16/21 Nustep L5 x631ms Walking outside 1 big loop   Leg ext 10# 2x10, 5# 2x10 HS curls 20# 2x10, 10# 2x10  Leg press 20# x10, 30# x10  Walking on beam      10/14/21  Bridges 1x15  Sidelying clams 1x10 B red TB   Objective measures and appropriate education   10/09/21 NuStep L4 x 5 min Sit to stands holdign yellow ball 2x10 Hamstring curls 20lb 2x10, LLE 10lb 2x5 Leg Extensions 10lb 2x10, LLE 5lb 2x5 Side step on an off airex Shoulder rows and ext red on airex 2x10   10/07/21 NuStep L3 x 6 min  Resisted gait 30# forward/backward, 20lb side step x3 each  Rows & lats 15lb 2x0    09/26/21 Nustep L5 x5m79m Lat pull down 15  # 2x10 Chest press 10# 2x10 Resisted gait 20# 3 way x3  Step ups 6" x10 each side  Walking on balance beam forwards and side steps x3  09/24/21 Nustep L4x5mi37mSeated rows 15# 2x10 STS w/OHP yellow ball 2x10 Shoulder ext 5# 2x10 Shoulder rows 5# 2x10 Knee flex 20# 2x10  Knee ext 15# 2x10 Leg press 20# x10, 30# x10     09/16/21 Nustep L4 x5min68mUE and LE)  Chest fly 3# 2x10 Shoulder ext 5# 2x10 Resisted gait 20# 4 way x4 each Step ups 6"  x10 each side   09/09/21 MMT  Grossly 4/5 overall LE strength  5xSTS: 17.86 s TUG 12.52  s FGA: 21/30  Nustep L5 x72mns  Step ups 6" x10 each side  Seated rows #10 2x10 Lat pull downs  10# 2x10   PATIENT EDUCATION: Education details: POC Person educated: Patient Education method: Explanation Education comprehension: verbalized understanding   HOME EXERCISE PROGRAM: Access Code: [email or web address]   ASSESSMENT:  CLINICAL IMPRESSION: Focused on LE strength and balance as she continues to state her left leg is weak. Difficulty with resisted gait today, LOB with side steps and backwards. CGA for SL and step ups on airex. Will continue to progress as able and tolerate moving forward.   OBJECTIVE IMPAIRMENTS decreased strength and pain.   ACTIVITY LIMITATIONS carrying, lifting, sleeping, and pushing, pulling  PERSONAL FACTORS Age, Behavior pattern, Time since onset of injury/illness/exacerbation, and 3+ comorbidities: CVD, Fibromyalgia, GERD, TIA  are also affecting patient's functional outcome.   REHAB POTENTIAL: Fair if her origin is MSK  CLINICAL DECISION MAKING: Evolving/moderate complexity  EVALUATION COMPLEXITY: Moderate   GOALS: Goals reviewed with patient? No  SHORT TERM GOALS: Target date: 09/12/21  Patient will be independent with initial HEP.  Goal status: not met not doing any exercises "they never gave me one"    LONG TERM GOALS: Target date: 10/21/21  Patient will demonstrate improved functional UE strength as demonstrated by >=4+/5 in muscle groups. Goal status: improving   2.  Patient will report 15 or lower on QUICKDASH to demonstrate improved functional ability.  Goal status: 8/21 45  3. Patient will report a decrease in chest pain on NPRS from 8/10 to 5/10 or better.   Baseline: 8/10 Goal status: Met    4. Patient will demonstrate <12s on TUG and 5xSTS to decrease fall risk. Baseline: 8/21- 17 5xSTS, TUG 9 seconds  Goal status: Met  10/09/21  5. Patient will score 25 or higher on FGA to demonstrate improved functional gait to ambulate within the community safely.   Baseline: 21/30   Goal status: ONGOING 8/21 not tested    PLAN: PT  FREQUENCY: 1-2x/week  PT DURATION: 6 weeks  PLANNED INTERVENTIONS: Therapeutic exercises, Therapeutic activity, Neuromuscular re-education, Balance training, Gait training, Patient/Family education, Joint mobilization, Cryotherapy, Moist heat, Ionotophoresis 490mml Dexamethasone, and Manual therapy  PLAN FOR NEXT SESSION: LE strengthening, balance training    Kristen U PT DPT PN2  10/21/2021, 3:42 PM

## 2021-10-22 NOTE — Therapy (Signed)
OUTPATIENT PHYSICAL THERAPY TREATMENT     Patient Name: Paige Pena MRN: [id number] DOB:[birth year], 81 y.o., female Today's Date: 10/23/2021   PT End of Session - 10/23/21 1331     Visit Number 14    Date for PT Re-Evaluation 11/08/21    Authorization Type [employer] 6/12    PT Start Time 1330    PT Stop Time 1406    PT Time Calculation (min) 36 min    Activity Tolerance Patient tolerated treatment well    Behavior During Therapy WFL for tasks assessed/performed                   Past Medical History:  Diagnosis Date   Acute cystitis    Anxiety    Aphasia    transsient aphasia Jan 2012   Asthmatic bronchitis    Cerebrovascular disease    Chest pain, atypical    Diverticulosis of colon    DJD (degenerative joint disease)    Dysphonia    Esophageal stricture    Fibromyalgia    Gastroparesis    GERD (gastroesophageal reflux disease)    Hematuria, microscopic    Hemorrhoids    Hypercholesteremia    IBS (irritable bowel syndrome)    Memory deficits 02/15/2013   Memory loss    RLS (restless legs syndrome)    TIA (transient ischemic attack)    Urinary incontinence    Vitamin D deficiency    Past Surgical History:  Procedure Laterality Date   ABDOMINAL HYSTERECTOMY  1995   corneal transplant for keratonconus     EYE SURGERY  2013   left foot fracture with ORIF  07/2004   Dr. Sharol Given   right bunion surgery     Patient Active Problem List   Diagnosis Date Noted   Pulmonary nodule 09/03/2021   Osteopenia 07/25/2021   Throat irritation 11/28/2019   HLD (hyperlipidemia)    Overactive bladder 05/08/2015   Aphasia    Cervical spondylitis (HCC)    Numbness and tingling 03/21/2015   TIA (transient ischemic attack) 03/21/2015   Physical exam 10/05/2014   Abdominal pain 05/17/2014   Allergic rhinitis 05/17/2014   Fatigue 04/04/2014   Anxiety and depression 07/25/2013   Weight loss 07/25/2013   Memory deficits 02/15/2013   Lumbar spondylosis 09/21/2012    Tachycardia 09/25/2011   Palpitations 09/16/2010   Dysphonia 09/05/2010   Slowing of urinary stream 09/25/2009   Cerebrovascular disease 04/03/2008   DYSPHONIA 04/03/2008   Diverticulosis of large intestine 04/02/2008   Irritable bowel syndrome 04/02/2008   DEGENERATIVE JOINT DISEASE 04/02/2008   HEMATURIA, MICROSCOPIC, HX OF 04/02/2008   Vitamin D deficiency 03/28/2008   Fibromyalgia 03/28/2008   URINARY INCONTINENCE 03/28/2008   GERD 08/02/2007   Gastroparesis 08/02/2007   HEMORRHOIDS 07/30/2007   ESOPHAGEAL STRICTURE 07/30/2007   Anxiety state 07/30/2007    PCP: Annye Asa  REFERRING PROVIDER: Annye Asa  REFERRING DIAG: R07.89  THERAPY DIAG:  Abnormal gait  Muscle weakness (generalized)  Rationale for Evaluation and Treatment Rehabilitation  ONSET DATE: 07/25/21  SUBJECTIVE:  SUBJECTIVE STATEMENT: I am doing fine  PERTINENT HISTORY: Fibromyaglia, dysphonia, anxiety, depression, osteopenia  PAIN:  Are you having pain? 0/10, more numb than anything   PRECAUTIONS: Fall  WEIGHT BEARING RESTRICTIONS No  FALLS:  Has patient fallen in last 6 months? Yes. Number of falls 1 off a ladder  LIVING ENVIRONMENT: Lives with: lives with their spouse Lives in: House/apartment Stairs: No Has following equipment at home: None  OCCUPATION: retired  PLOF: Independent  PATIENT GOALS to get rid of pain   OBJECTIVE:   DIAGNOSTIC FINDINGS:  Right Chest DG 2/14 IMPRESSION: 1. No displaced right rib fracture identified. 2. No acute cardiopulmonary abnormality.  PATIENT SURVEYS:  Quick Dash 29.5   POSTURE: Forward head, rounded shoulders  UPPER EXTREMITY ROM: [facility]  MMT Right eval Left eval 8/21 R 8/21 L   Shoulder flexion (feels a pressure in her chest) 3+ 3+ 4+ 4+   Shoulder extension      Shoulder abduction 4 4 4+ 4+  Shoulder adduction      Shoulder internal rotation 3 4 4+ 4+  Shoulder external rotation 4 4 3+ 3+  Elbow flexion 4 4 4+ 4+  Elbow extension 4 4 3+ 3+  Wrist flexion      Wrist extension      Wrist ulnar deviation      Wrist radial deviation      Wrist pronation      Wrist supination      (Blank rows = not tested)  Horizontal ABD 4/5 & Horizontal ADD 4/5 causes some pressure at xiphoid process  BLE strength: Knee extension: R 4+ L 4 Knee flexion: R 4+ L 4+ Hip flexion: R 4 L 4 Seated hip ABD: 3/5 B  Seated hip extension: 3+/5     TODAY'S TREATMENT:  10/23/21 Nustep L5 x12mns Step up on 8"  Leg ext 10# 3x10, 5# x10 LLE  HS curls 20# 3x10, 10# x10 LLE  Leg press 40# 3x10, 20# x10 LLE   10/21/21 Nustep L5 x694ms  Step up on airex on 6" Side steps on airex  SL tapping cones  Resisted gait 15#  4 way x4   10/16/21 Nustep L5 x6m93m Walking outside 1 big loop   Leg ext 10# 2x10, 5# 2x10 HS curls 20# 2x10, 10# 2x10  Leg press 20# x10, 30# x10  Walking on beam      10/14/21  Bridges 1x15  Sidelying clams 1x10 B red TB   Objective measures and appropriate education   10/09/21 NuStep L4 x 5 min Sit to stands holdign yellow ball 2x10 Hamstring curls 20lb 2x10, LLE 10lb 2x5 Leg Extensions 10lb 2x10, LLE 5lb 2x5 Side step on an off airex Shoulder rows and ext red on airex 2x10   10/07/21 NuStep L3 x 6 min  Resisted gait 30# forward/backward, 20lb side step x3 each  Rows & lats 15lb 2x0    09/26/21 Nustep L5 x5mi68mLat pull down 15  # 2x10 Chest press 10# 2x10 Resisted gait 20# 3 way x3  Step ups 6" x10 each side  Walking on balance beam forwards and side steps x3  09/24/21 Nustep L4x5min43meated rows 15# 2x10 STS w/OHP yellow ball 2x10 Shoulder ext 5# 2x10 Shoulder rows 5# 2x10 Knee flex 20# 2x10  Knee ext 15# 2x10 Leg press 20# x10, 30# x10     09/16/21 Nustep L4 x5mins42mE and LE)  Chest fly  3# 2x10 Shoulder ext 5# 2x10 Resisted gait 20# 4 way x4  each Step ups 6"  x10 each side   09/09/21 MMT  Grossly 4/5 overall LE strength  5xSTS: 17.86 s TUG 12.52 s FGA: 21/30  Nustep L5 x48mns  Step ups 6" x10 each side  Seated rows #10 2x10 Lat pull downs 10# 2x10   PATIENT EDUCATION: Education details: POC Person educated: Patient Education method: Explanation Education comprehension: verbalized understanding   HOME EXERCISE PROGRAM: Access Code: [email or web address]   ASSESSMENT:  CLINICAL IMPRESSION: Patient arrives ~15 mins to appointment. We continued working on LE strengthening with the time we had. Added in more sets today, tolerates well and was educated about possible soreness.   OBJECTIVE IMPAIRMENTS decreased strength and pain.   ACTIVITY LIMITATIONS carrying, lifting, sleeping, and pushing, pulling  PERSONAL FACTORS Age, Behavior pattern, Time since onset of injury/illness/exacerbation, and 3+ comorbidities: CVD, Fibromyalgia, GERD, TIA  are also affecting patient's functional outcome.   REHAB POTENTIAL: Fair if her origin is [place]  CLINICAL DECISION MAKING: Evolving/moderate complexity  EVALUATION COMPLEXITY: Moderate   GOALS: Goals reviewed with patient? No  SHORT TERM GOALS: Target date: 09/12/21  Patient will be independent with initial HEP.  Goal status: not met not doing any exercises "they never gave me one"    LONG TERM GOALS: Target date: 10/21/21  Patient will demonstrate improved functional UE strength as demonstrated by >=4+/5 in muscle groups. Goal status: improving   2.  Patient will report 15 or lower on QUICKDASH to demonstrate improved functional ability.  Goal status: 8/21 45  3. Patient will report a decrease in chest pain on NPRS from 8/10 to 5/10 or better.   Baseline: 8/10 Goal status: Met    4. Patient will demonstrate <12s on TUG and 5xSTS to decrease fall risk. Baseline: 8/21- 17 5xSTS, TUG 9 seconds  Goal status: Met   10/09/21  5. Patient will score 25 or higher on FGA to demonstrate improved functional gait to ambulate within the community safely.   Baseline: 21/30   Goal status: ONGOING 8/21 not tested    PLAN: PT FREQUENCY: 1-2x/week  PT DURATION: 6 weeks  PLANNED INTERVENTIONS: Therapeutic exercises, Therapeutic activity, Neuromuscular re-education, Balance training, Gait training, Patient/Family education, Joint mobilization, Cryotherapy, Moist heat, Ionotophoresis 477mml Dexamethasone, and Manual therapy  PLAN FOR NEXT SESSION: LE strengthening, balance training    Kristen U PT DPT PN2  10/23/2021, 2:06 PM

## 2021-10-23 ENCOUNTER — Ambulatory Visit: Payer: Medicare HMO

## 2021-10-23 DIAGNOSIS — R269 Unspecified abnormalities of gait and mobility: Secondary | ICD-10-CM

## 2021-10-23 DIAGNOSIS — R0789 Other chest pain: Secondary | ICD-10-CM | POA: Diagnosis not present

## 2021-10-23 DIAGNOSIS — M6281 Muscle weakness (generalized): Secondary | ICD-10-CM | POA: Diagnosis not present

## 2021-10-30 ENCOUNTER — Encounter: Payer: Self-pay | Admitting: Physical Therapy

## 2021-10-30 ENCOUNTER — Ambulatory Visit: Payer: Medicare HMO | Attending: Family Medicine | Admitting: Physical Therapy

## 2021-10-30 DIAGNOSIS — M6281 Muscle weakness (generalized): Secondary | ICD-10-CM | POA: Diagnosis not present

## 2021-10-30 DIAGNOSIS — R0789 Other chest pain: Secondary | ICD-10-CM

## 2021-10-30 DIAGNOSIS — R269 Unspecified abnormalities of gait and mobility: Secondary | ICD-10-CM

## 2021-10-30 NOTE — Therapy (Signed)
OUTPATIENT PHYSICAL THERAPY TREATMENT     Patient Name: Paige Pena MRN: [id number] DOB:1940/07/20, 81 y.o., female Today's Date: 10/30/2021   PT End of Session - 10/30/21 0941     Visit Number 15    Date for PT Re-Evaluation 11/08/21    PT Start Time 0938    PT Stop Time 0931    PT Time Calculation (min) 37 min    Activity Tolerance Patient tolerated treatment well    Behavior During Therapy [facility] for tasks assessed/performed                   Past Medical History:  Diagnosis Date   Acute cystitis    Anxiety    Aphasia    transsient aphasia Jan 2012   Asthmatic bronchitis    Cerebrovascular disease    Chest pain, atypical    Diverticulosis of colon    DJD (degenerative joint disease)    Dysphonia    Esophageal stricture    Fibromyalgia    Gastroparesis    GERD (gastroesophageal reflux disease)    Hematuria, microscopic    Hemorrhoids    Hypercholesteremia    IBS (irritable bowel syndrome)    Memory deficits 02/15/2013   Memory loss    RLS (restless legs syndrome)    TIA (transient ischemic attack)    Urinary incontinence    Vitamin D deficiency    Past Surgical History:  Procedure Laterality Date   ABDOMINAL HYSTERECTOMY  1995   corneal transplant for keratonconus     EYE SURGERY  2013   left foot fracture with ORIF  07/2004   Dr. Sharol Given   right bunion surgery     Patient Active Problem List   Diagnosis Date Noted   Pulmonary nodule 09/03/2021   Osteopenia 07/25/2021   Throat irritation 11/28/2019   HLD (hyperlipidemia)    Overactive bladder 05/08/2015   Aphasia    Cervical spondylitis (HCC)    Numbness and tingling 03/21/2015   TIA (transient ischemic attack) 03/21/2015   Physical exam 10/05/2014   Abdominal pain 05/17/2014   Allergic rhinitis 05/17/2014   Fatigue 04/04/2014   Anxiety and depression 07/25/2013   Weight loss 07/25/2013   Memory deficits 02/15/2013   Lumbar spondylosis 09/21/2012   Tachycardia 09/25/2011    Palpitations 09/16/2010   Dysphonia 09/05/2010   Slowing of urinary stream 09/25/2009   Cerebrovascular disease 04/03/2008   DYSPHONIA 04/03/2008   Diverticulosis of large intestine 04/02/2008   Irritable bowel syndrome 04/02/2008   DEGENERATIVE JOINT DISEASE 04/02/2008   HEMATURIA, MICROSCOPIC, HX OF 04/02/2008   Vitamin D deficiency 03/28/2008   Fibromyalgia 03/28/2008   URINARY INCONTINENCE 03/28/2008   GERD 08/02/2007   Gastroparesis 08/02/2007   HEMORRHOIDS 07/30/2007   ESOPHAGEAL STRICTURE 07/30/2007   Anxiety state 07/30/2007    PCP: Annye Asa  REFERRING PROVIDER: Annye Asa  REFERRING DIAG: R07.89  THERAPY DIAG:  Abnormal gait  Muscle weakness (generalized)  Other chest pain  Rationale for Evaluation and Treatment Rehabilitation  ONSET DATE: 07/25/21  SUBJECTIVE:  SUBJECTIVE STATEMENT: I am fine  PERTINENT HISTORY: Fibromyaglia, dysphonia, anxiety, depression, osteopenia  PAIN:  Are you having pain? 0/10, more numb than anything   PRECAUTIONS: Fall  WEIGHT BEARING RESTRICTIONS No  FALLS:  Has patient fallen in last 6 months? Yes. Number of falls 1 off a ladder  LIVING ENVIRONMENT: Lives with: lives with their spouse Lives in: House/apartment Stairs: No Has following equipment at home: None  OCCUPATION: retired  PLOF: Independent  PATIENT GOALS to get rid of pain   OBJECTIVE:   DIAGNOSTIC FINDINGS:  Right Chest DG 2/14 IMPRESSION: 1. No displaced right rib fracture identified. 2. No acute cardiopulmonary abnormality.  PATIENT SURVEYS:  Quick Dash 29.5   POSTURE: Forward head, rounded shoulders  UPPER EXTREMITY ROM: [facility]  MMT Right eval Left eval 8/21 R 8/21 L   Shoulder flexion (feels a pressure in her chest) 3+ 3+ 4+ 4+  Shoulder  extension      Shoulder abduction 4 4 4+ 4+  Shoulder adduction      Shoulder internal rotation 3 4 4+ 4+  Shoulder external rotation 4 4 3+ 3+  Elbow flexion 4 4 4+ 4+  Elbow extension 4 4 3+ 3+  Wrist flexion      Wrist extension      Wrist ulnar deviation      Wrist radial deviation      Wrist pronation      Wrist supination      (Blank rows = not tested)  Horizontal ABD 4/5 & Horizontal ADD 4/5 causes some pressure at xiphoid process  BLE strength: Knee extension: R 4+ L 4 Knee flexion: R 4+ L 4+ Hip flexion: R 4 L 4 Seated hip ABD: 3/5 B  Seated hip extension: 3+/5     TODAY'S TREATMENT:  10/30/21 NuStep L5 x6 min  Leg Ext 10lb 2x12, LLE 5lb x10 x4  HS curls 20lb 2x12   Leg press 20lb 2x12 Tandem walking on airex in // bar  10/23/21 Nustep L5 x72mns Step up on 8"  Leg ext 10# 3x10, 5# x10 LLE  HS curls 20# 3x10, 10# x10 LLE  Leg press 40# 3x10, 20# x10 LLE   10/21/21 Nustep L5 x636ms  Step up on airex on 6" Side steps on airex  SL tapping cones  Resisted gait 15#  4 way x4   10/16/21 Nustep L5 x6m69m Walking outside 1 big loop   Leg ext 10# 2x10, 5# 2x10 HS curls 20# 2x10, 10# 2x10  Leg press 20# x10, 30# x10  Walking on beam      10/14/21  Bridges 1x15  Sidelying clams 1x10 B red TB   Objective measures and appropriate education   10/09/21 NuStep L4 x 5 min Sit to stands holdign yellow ball 2x10 Hamstring curls 20lb 2x10, LLE 10lb 2x5 Leg Extensions 10lb 2x10, LLE 5lb 2x5 Side step on an off airex Shoulder rows and ext red on airex 2x10   10/07/21 NuStep L3 x 6 min  Resisted gait 30# forward/backward, 20lb side step x3 each  Rows & lats 15lb 2x0    09/26/21 Nustep L5 x5mi42mLat pull down 15  # 2x10 Chest press 10# 2x10 Resisted gait 20# 3 way x3  Step ups 6" x10 each side  Walking on balance beam forwards and side steps x3  09/24/21 Nustep L4x5min23meated rows 15# 2x10 STS w/OHP yellow ball 2x10 Shoulder ext 5# 2x10 Shoulder rows  5# 2x10 Knee flex 20# 2x10  Knee ext 15# 2x10  Leg press 20# x10, 30# x10     09/16/21 Nustep L4 x38mns (UE and LE)  Chest fly 3# 2x10 Shoulder ext 5# 2x10 Resisted gait 20# 4 way x4 each Step ups 6"  x10 each side   09/09/21 MMT  Grossly 4/5 overall LE strength  5xSTS: 17.86 s TUG 12.52 s FGA: 21/30  Nustep L5 x516ms  Step ups 6" x10 each side  Seated rows #10 2x10 Lat pull downs 10# 2x10   PATIENT EDUCATION: Education details: POC Person educated: Patient Education method: Explanation Education comprehension: verbalized understanding   HOME EXERCISE PROGRAM: Access Code: [email or web address]   ASSESSMENT:  CLINICAL IMPRESSION: Patient arrives ~8 mins to appointment. We continued working on LE strengthening with the time we had. Less weight tolerated on machine due to pt reports of fatigue. Pt stated the early appointment time was th reason for weakness. Some UE use required with tandem walking.  OBJECTIVE IMPAIRMENTS decreased strength and pain.   ACTIVITY LIMITATIONS carrying, lifting, sleeping, and pushing, pulling  PERSONAL FACTORS Age, Behavior pattern, Time since onset of injury/illness/exacerbation, and 3+ comorbidities: CVD, Fibromyalgia, GERD, TIA  are also affecting patient's functional outcome.   REHAB POTENTIAL: Fair if her origin is [place]  CLINICAL DECISION MAKING: Evolving/moderate complexity  EVALUATION COMPLEXITY: Moderate   GOALS: Goals reviewed with patient? No  SHORT TERM GOALS: Target date: 09/12/21  Patient will be independent with initial HEP.  Goal status: not met not doing any exercises "they never gave me one"    LONG TERM GOALS: Target date: 10/21/21  Patient will demonstrate improved functional UE strength as demonstrated by >=4+/5 in muscle groups. Goal status: improving   2.  Patient will report 15 or lower on QUICKDASH to demonstrate improved functional ability.  Goal status: 8/21 45  3. Patient will report a decrease in chest pain on  NPRS from 8/10 to 5/10 or better.   Baseline: 8/10 Goal status: Met    4. Patient will demonstrate <12s on TUG and 5xSTS to decrease fall risk. Baseline: 8/21- 17 5xSTS, TUG 9 seconds  Goal status: Met  10/09/21  5. Patient will score 25 or higher on FGA to demonstrate improved functional gait to ambulate within the community safely.   Baseline: 21/30   Goal status: ONGOING 8/21 not tested    PLAN: PT FREQUENCY: 1-2x/week  PT DURATION: 6 weeks  PLANNED INTERVENTIONS: Therapeutic exercises, Therapeutic activity, Neuromuscular re-education, Balance training, Gait training, Patient/Family education, Joint mobilization, Cryotherapy, Moist heat, Ionotophoresis 34m634ml Dexamethasone, and Manual therapy  PLAN FOR NEXT SESSION: LE strengthening, balance training    Kristen U PT DPT PN2  10/30/2021, 9:42 AM

## 2021-11-04 ENCOUNTER — Encounter: Payer: Self-pay | Admitting: Physical Therapy

## 2021-11-04 ENCOUNTER — Ambulatory Visit: Payer: Medicare HMO | Admitting: Physical Therapy

## 2021-11-04 DIAGNOSIS — R0789 Other chest pain: Secondary | ICD-10-CM | POA: Diagnosis not present

## 2021-11-04 DIAGNOSIS — R269 Unspecified abnormalities of gait and mobility: Secondary | ICD-10-CM

## 2021-11-04 DIAGNOSIS — M6281 Muscle weakness (generalized): Secondary | ICD-10-CM | POA: Diagnosis not present

## 2021-11-04 NOTE — Therapy (Signed)
OUTPATIENT PHYSICAL THERAPY TREATMENT     Patient Name: Paige Pena MRN: [id number] DOB:1940-03-20, 81 y.o., female Today's Date: 11/04/2021   PT End of Session - 11/04/21 1112     Visit Number 16    Date for PT Re-Evaluation 11/08/21    PT Start Time 1112    PT Stop Time 1145    PT Time Calculation (min) 33 min    Activity Tolerance Patient tolerated treatment well    Behavior During Therapy [facility] for tasks assessed/performed                   Past Medical History:  Diagnosis Date   Acute cystitis    Anxiety    Aphasia    transsient aphasia Jan 2012   Asthmatic bronchitis    Cerebrovascular disease    Chest pain, atypical    Diverticulosis of colon    DJD (degenerative joint disease)    Dysphonia    Esophageal stricture    Fibromyalgia    Gastroparesis    GERD (gastroesophageal reflux disease)    Hematuria, microscopic    Hemorrhoids    Hypercholesteremia    IBS (irritable bowel syndrome)    Memory deficits 02/15/2013   Memory loss    RLS (restless legs syndrome)    TIA (transient ischemic attack)    Urinary incontinence    Vitamin D deficiency    Past Surgical History:  Procedure Laterality Date   ABDOMINAL HYSTERECTOMY  1995   corneal transplant for keratonconus     EYE SURGERY  2013   left foot fracture with ORIF  07/2004   Dr. Sharol Given   right bunion surgery     Patient Active Problem List   Diagnosis Date Noted   Pulmonary nodule 09/03/2021   Osteopenia 07/25/2021   Throat irritation 11/28/2019   HLD (hyperlipidemia)    Overactive bladder 05/08/2015   Aphasia    Cervical spondylitis (HCC)    Numbness and tingling 03/21/2015   TIA (transient ischemic attack) 03/21/2015   Physical exam 10/05/2014   Abdominal pain 05/17/2014   Allergic rhinitis 05/17/2014   Fatigue 04/04/2014   Anxiety and depression 07/25/2013   Weight loss 07/25/2013   Memory deficits 02/15/2013   Lumbar spondylosis 09/21/2012   Tachycardia 09/25/2011    Palpitations 09/16/2010   Dysphonia 09/05/2010   Slowing of urinary stream 09/25/2009   Cerebrovascular disease 04/03/2008   DYSPHONIA 04/03/2008   Diverticulosis of large intestine 04/02/2008   Irritable bowel syndrome 04/02/2008   DEGENERATIVE JOINT DISEASE 04/02/2008   HEMATURIA, MICROSCOPIC, HX OF 04/02/2008   Vitamin D deficiency 03/28/2008   Fibromyalgia 03/28/2008   URINARY INCONTINENCE 03/28/2008   GERD 08/02/2007   Gastroparesis 08/02/2007   HEMORRHOIDS 07/30/2007   ESOPHAGEAL STRICTURE 07/30/2007   Anxiety state 07/30/2007    PCP: Annye Asa  REFERRING PROVIDER: Annye Asa  REFERRING DIAG: R07.89  THERAPY DIAG:  Abnormal gait  Muscle weakness (generalized)  Other chest pain  Rationale for Evaluation and Treatment Rehabilitation  ONSET DATE: 07/25/21  SUBJECTIVE:  SUBJECTIVE STATEMENT: I am fine  PERTINENT HISTORY: Fibromyaglia, dysphonia, anxiety, depression, osteopenia  PAIN:  Are you having pain? 0/10, more numb than anything   PRECAUTIONS: Fall  WEIGHT BEARING RESTRICTIONS No  FALLS:  Has patient fallen in last 6 months? Yes. Number of falls 1 off a ladder  LIVING ENVIRONMENT: Lives with: lives with their spouse Lives in: House/apartment Stairs: No Has following equipment at home: None  OCCUPATION: retired  PLOF: Independent  PATIENT GOALS to get rid of pain   OBJECTIVE:   DIAGNOSTIC FINDINGS:  Right Chest DG 2/14 IMPRESSION: 1. No displaced right rib fracture identified. 2. No acute cardiopulmonary abnormality.  PATIENT SURVEYS:  Quick Dash 29.5   POSTURE: Forward head, rounded shoulders  UPPER EXTREMITY ROM: [facility]  MMT Right eval Left eval 8/21 R 8/21 L   Shoulder flexion (feels a pressure in her chest) 3+ 3+ 4+ 4+  Shoulder  extension      Shoulder abduction 4 4 4+ 4+  Shoulder adduction      Shoulder internal rotation 3 4 4+ 4+  Shoulder external rotation 4 4 3+ 3+  Elbow flexion 4 4 4+ 4+  Elbow extension 4 4 3+ 3+  Wrist flexion      Wrist extension      Wrist ulnar deviation      Wrist radial deviation      Wrist pronation      Wrist supination      (Blank rows = not tested)  Horizontal ABD 4/5 & Horizontal ADD 4/5 causes some pressure at xiphoid process  BLE strength: Knee extension: R 4+ L 4 Knee flexion: R 4+ L 4+ Hip flexion: R 4 L 4 Seated hip ABD: 3/5 B  Seated hip extension: 3+/5     TODAY'S TREATMENT:  11/04/21 NuStel L4 x 5 min  HS curls 20lb 2x12   Leg Ext 5lb 2x10 Gait one lat around back building S2S holding yellow ball 2x10   10/30/21 NuStep L5 x6 min  Leg Ext 10lb 2x12, LLE 5lb x10 x4  HS curls 20lb 2x12   Leg press 20lb 2x12 Tandem walking on airex in // bar  10/23/21 Nustep L5 x53mns Step up on 8"  Leg ext 10# 3x10, 5# x10 LLE  HS curls 20# 3x10, 10# x10 LLE  Leg press 40# 3x10, 20# x10 LLE   10/21/21 Nustep L5 x66ms  Step up on airex on 6" Side steps on airex  SL tapping cones  Resisted gait 15#  4 way x4   10/16/21 Nustep L5 x6m91m Walking outside 1 big loop   Leg ext 10# 2x10, 5# 2x10 HS curls 20# 2x10, 10# 2x10  Leg press 20# x10, 30# x10  Walking on beam      10/14/21  Bridges 1x15  Sidelying clams 1x10 B red TB   Objective measures and appropriate education   10/09/21 NuStep L4 x 5 min Sit to stands holdign yellow ball 2x10 Hamstring curls 20lb 2x10, LLE 10lb 2x5 Leg Extensions 10lb 2x10, LLE 5lb 2x5 Side step on an off airex Shoulder rows and ext red on airex 2x10 PATIENT EDUCATION: Education details: POC Person educated: Patient Education method: Explanation Education comprehension: verbalized understanding   HOME EXERCISE PROGRAM: Access Code: [email or web address]   ASSESSMENT:  CLINICAL IMPRESSION: Patient arrives ~12 mins to  appointment. We continued working on LE strengthening with the time we had. Less weight tolerated with machine level leg Ext, due to weakness. Pt verbalized that the feels like she isnt  walking straight despite reporting not veering off  when walking. No issue with outdoor ambulation.   OBJECTIVE IMPAIRMENTS decreased strength and pain.   ACTIVITY LIMITATIONS carrying, lifting, sleeping, and pushing, pulling  PERSONAL FACTORS Age, Behavior pattern, Time since onset of injury/illness/exacerbation, and 3+ comorbidities: CVD, Fibromyalgia, GERD, TIA  are also affecting patient's functional outcome.   REHAB POTENTIAL: Fair if her origin is [place]  CLINICAL DECISION MAKING: Evolving/moderate complexity  EVALUATION COMPLEXITY: Moderate   GOALS: Goals reviewed with patient? No  SHORT TERM GOALS: Target date: 09/12/21  Patient will be independent with initial HEP.  Goal status: not met not doing any exercises "they never gave me one"    LONG TERM GOALS: Target date: 10/21/21  Patient will demonstrate improved functional UE strength as demonstrated by >=4+/5 in muscle groups. Goal status: improving   2.  Patient will report 15 or lower on QUICKDASH to demonstrate improved functional ability.  Goal status: 8/21 45  3. Patient will report a decrease in chest pain on NPRS from 8/10 to 5/10 or better.   Baseline: 8/10 Goal status: Met    4. Patient will demonstrate <12s on TUG and 5xSTS to decrease fall risk. Baseline: 8/21- 17 5xSTS, TUG 9 seconds  Goal status: Met  10/09/21  5. Patient will score 25 or higher on FGA to demonstrate improved functional gait to ambulate within the community safely.   Baseline: 21/30   Goal status: ONGOING 8/21 not tested    PLAN: PT FREQUENCY: 1-2x/week  PT DURATION: 6 weeks  PLANNED INTERVENTIONS: Therapeutic exercises, Therapeutic activity, Neuromuscular re-education, Balance training, Gait training, Patient/Family education, Joint mobilization,  Cryotherapy, Moist heat, Ionotophoresis 45m/ml Dexamethasone, and Manual therapy  PLAN FOR NEXT SESSION: LE strengthening, balance training    Kristen U PT DPT PN2  11/04/2021, 11:12 AM

## 2021-11-08 ENCOUNTER — Ambulatory Visit: Payer: Medicare HMO

## 2021-11-08 NOTE — Therapy (Unsigned)
OUTPATIENT PHYSICAL THERAPY TREATMENT     Patient Name: Paige Pena MRN: [id number] DOB:1940-05-11, 81 y.o., female Today's Date: 11/11/2021   PT End of Session - 11/11/21 1106     Visit Number 17    Date for PT Re-Evaluation 11/08/21    PT Start Time 1106    PT Stop Time 1145    PT Time Calculation (min) 39 min    Activity Tolerance Patient tolerated treatment well    Behavior During Therapy [facility] for tasks assessed/performed                    Past Medical History:  Diagnosis Date   Acute cystitis    Anxiety    Aphasia    transsient aphasia Jan 2012   Asthmatic bronchitis    Cerebrovascular disease    Chest pain, atypical    Diverticulosis of colon    DJD (degenerative joint disease)    Dysphonia    Esophageal stricture    Fibromyalgia    Gastroparesis    GERD (gastroesophageal reflux disease)    Hematuria, microscopic    Hemorrhoids    Hypercholesteremia    IBS (irritable bowel syndrome)    Memory deficits 02/15/2013   Memory loss    RLS (restless legs syndrome)    TIA (transient ischemic attack)    Urinary incontinence    Vitamin D deficiency    Past Surgical History:  Procedure Laterality Date   ABDOMINAL HYSTERECTOMY  1995   corneal transplant for keratonconus     EYE SURGERY  2013   left foot fracture with ORIF  07/2004   Dr. Sharol Given   right bunion surgery     Patient Active Problem List   Diagnosis Date Noted   Pulmonary nodule 09/03/2021   Osteopenia 07/25/2021   Throat irritation 11/28/2019   HLD (hyperlipidemia)    Overactive bladder 05/08/2015   Aphasia    Cervical spondylitis (HCC)    Numbness and tingling 03/21/2015   TIA (transient ischemic attack) 03/21/2015   Physical exam 10/05/2014   Abdominal pain 05/17/2014   Allergic rhinitis 05/17/2014   Fatigue 04/04/2014   Anxiety and depression 07/25/2013   Weight loss 07/25/2013   Memory deficits 02/15/2013   Lumbar spondylosis 09/21/2012   Tachycardia 09/25/2011    Palpitations 09/16/2010   Dysphonia 09/05/2010   Slowing of urinary stream 09/25/2009   Cerebrovascular disease 04/03/2008   DYSPHONIA 04/03/2008   Diverticulosis of large intestine 04/02/2008   Irritable bowel syndrome 04/02/2008   DEGENERATIVE JOINT DISEASE 04/02/2008   HEMATURIA, MICROSCOPIC, HX OF 04/02/2008   Vitamin D deficiency 03/28/2008   Fibromyalgia 03/28/2008   URINARY INCONTINENCE 03/28/2008   GERD 08/02/2007   Gastroparesis 08/02/2007   HEMORRHOIDS 07/30/2007   ESOPHAGEAL STRICTURE 07/30/2007   Anxiety state 07/30/2007    PCP: Annye Asa  REFERRING PROVIDER: Annye Asa  REFERRING DIAG: R07.89  THERAPY DIAG:  Abnormal gait  Muscle weakness (generalized)  Rationale for Evaluation and Treatment Rehabilitation  ONSET DATE: 07/25/21  SUBJECTIVE:  SUBJECTIVE STATEMENT: I am not doing good this morning, I just feel tired.   PERTINENT HISTORY: Fibromyaglia, dysphonia, anxiety, depression, osteopenia  PAIN:  Are you having pain? 0/10, more numb than anything   PRECAUTIONS: Fall  WEIGHT BEARING RESTRICTIONS No  FALLS:  Has patient fallen in last 6 months? Yes. Number of falls 1 off a ladder  LIVING ENVIRONMENT: Lives with: lives with their spouse Lives in: House/apartment Stairs: No Has following equipment at home: None  OCCUPATION: retired  PLOF: Independent  PATIENT GOALS to get rid of pain   OBJECTIVE:   DIAGNOSTIC FINDINGS:  Right Chest DG 2/14 IMPRESSION: 1. No displaced right rib fracture identified. 2. No acute cardiopulmonary abnormality.  PATIENT SURVEYS:  Quick Dash 29.5   POSTURE: Forward head, rounded shoulders  UPPER EXTREMITY ROM: [facility]  MMT Right eval Left eval 8/21 R 8/21 L   Shoulder flexion (feels a pressure in her chest) 3+  3+ 4+ 4+  Shoulder extension      Shoulder abduction 4 4 4+ 4+  Shoulder adduction      Shoulder internal rotation 3 4 4+ 4+  Shoulder external rotation 4 4 3+ 3+  Elbow flexion 4 4 4+ 4+  Elbow extension 4 4 3+ 3+  Wrist flexion      Wrist extension      Wrist ulnar deviation      Wrist radial deviation      Wrist pronation      Wrist supination      (Blank rows = not tested)  Horizontal ABD 4/5 & Horizontal ADD 4/5 causes some pressure at xiphoid process  BLE strength: Knee extension: R 4+ L 4 Knee flexion: R 4+ L 4+ Hip flexion: R 4 L 4 Seated hip ABD: 3/5 B  Seated hip extension: 3+/5     TODAY'S TREATMENT:  11/08/21 Nustep L5 x59mns FGA Resisted side steps 10# over obstacles 5x each side  Leg press 20# 2x10  S2S with press out yellow ball 2x10  Standing on foam hitting ball  11/04/21 NuStel L4 x 5 min  HS curls 20lb 2x12   Leg Ext 5lb 2x10 Gait one lat around back building S2S holding yellow ball 2x10  10/30/21 NuStep L5 x6 min  Leg Ext 10lb 2x12, LLE 5lb x10 x4  HS curls 20lb 2x12   Leg press 20lb 2x12 Tandem walking on airex in // bar  10/23/21 Nustep L5 x528ms Step up on 8"  Leg ext 10# 3x10, 5# x10 LLE  HS curls 20# 3x10, 10# x10 LLE  Leg press 40# 3x10, 20# x10 LLE   10/21/21 Nustep L5 x6m61m  Step up on airex on 6" Side steps on airex  SL tapping cones  Resisted gait 15#  4 way x4   10/16/21 Nustep L5 x6mi53mWalking outside 1 big loop   Leg ext 10# 2x10, 5# 2x10 HS curls 20# 2x10, 10# 2x10  Leg press 20# x10, 30# x10  Walking on beam      10/14/21  Bridges 1x15  Sidelying clams 1x10 B red TB   Objective measures and appropriate education   10/09/21 NuStep L4 x 5 min Sit to stands holdign yellow ball 2x10 Hamstring curls 20lb 2x10, LLE 10lb 2x5 Leg Extensions 10lb 2x10, LLE 5lb 2x5 Side step on an off airex Shoulder rows and ext red on airex 2x10 PATIENT EDUCATION: Education details: POC Person educated: Patient Education  method: Explanation Education comprehension: verbalized understanding   HOME EXERCISE PROGRAM: Access Code: [email or web address]  ASSESSMENT:  CLINICAL IMPRESSION: Patient arrives a few minutes late today, we talked about how she has 3 visits left. We reassessed FGA and continued working on strength and balance. She has met almost all of her goals and will progress to a HEP and ready for discharge in the next week.   OBJECTIVE IMPAIRMENTS decreased strength and pain.   ACTIVITY LIMITATIONS carrying, lifting, sleeping, and pushing, pulling  PERSONAL FACTORS Age, Behavior pattern, Time since onset of injury/illness/exacerbation, and 3+ comorbidities: CVD, Fibromyalgia, GERD, TIA  are also affecting patient's functional outcome.   REHAB POTENTIAL: Fair if her origin is [place]  CLINICAL DECISION MAKING: Evolving/moderate complexity  EVALUATION COMPLEXITY: Moderate   GOALS: Goals reviewed with patient? No  SHORT TERM GOALS: Target date: 09/12/21  Patient will be independent with initial HEP.  Goal status: not met not doing any exercises "they never gave me one"    LONG TERM GOALS: Target date: 10/21/21  Patient will demonstrate improved functional UE strength as demonstrated by >=4+/5 in muscle groups. Goal status: improving   2.  Patient will report 15 or lower on QUICKDASH to demonstrate improved functional ability.  Goal status: 8/21 45. met  3. Patient will report a decrease in chest pain on NPRS from 8/10 to 5/10 or better.   Baseline: 8/10 Goal status: Met    4. Patient will demonstrate <12s on TUG and 5xSTS to decrease fall risk. Baseline: 8/21- 17 5xSTS, TUG 9 seconds  Goal status: Met  10/09/21  5. Patient will score 25 or higher on FGA to demonstrate improved functional gait to ambulate within the community safely.   Baseline: 21/30, 25/30-9/18  Goal status: Met 9/18   PLAN: PT FREQUENCY: 1-2x/week  PT DURATION: 6 weeks  PLANNED INTERVENTIONS: Therapeutic exercises,  Therapeutic activity, Neuromuscular re-education, Balance training, Gait training, Patient/Family education, Joint mobilization, Cryotherapy, Moist heat, Ionotophoresis 70m/ml Dexamethasone, and Manual therapy  PLAN FOR NEXT SESSION: LE strengthening, balance training    Kristen U PT DPT PN2  11/11/2021, 11:45 AM

## 2021-11-11 ENCOUNTER — Ambulatory Visit: Payer: Medicare HMO

## 2021-11-11 DIAGNOSIS — R0789 Other chest pain: Secondary | ICD-10-CM | POA: Diagnosis not present

## 2021-11-11 DIAGNOSIS — M6281 Muscle weakness (generalized): Secondary | ICD-10-CM | POA: Diagnosis not present

## 2021-11-11 DIAGNOSIS — R269 Unspecified abnormalities of gait and mobility: Secondary | ICD-10-CM | POA: Diagnosis not present

## 2021-11-13 ENCOUNTER — Telehealth: Payer: Self-pay | Admitting: Podiatry

## 2021-11-13 ENCOUNTER — Ambulatory Visit: Payer: Medicare HMO

## 2021-11-13 DIAGNOSIS — R0789 Other chest pain: Secondary | ICD-10-CM | POA: Diagnosis not present

## 2021-11-13 DIAGNOSIS — R269 Unspecified abnormalities of gait and mobility: Secondary | ICD-10-CM | POA: Diagnosis not present

## 2021-11-13 DIAGNOSIS — M6281 Muscle weakness (generalized): Secondary | ICD-10-CM | POA: Diagnosis not present

## 2021-11-13 NOTE — Telephone Encounter (Signed)
Pt left message on my voicemail stating she was returning a call to [place] that she received.  Upon checking it was probably a reminder call. I left pt message it was a reminder call and then she called right back and shannon confirmed it was a reminder call as well.

## 2021-11-13 NOTE — Therapy (Signed)
OUTPATIENT PHYSICAL THERAPY TREATMENT     Patient Name: Paige Pena MRN: [id number] DOB:[birth year], 81 y.o., female Today's Date: 11/13/2021   PT End of Session - 11/13/21 1114     Visit Number 18    Date for PT Re-Evaluation 11/22/21    PT Start Time 1115    PT Stop Time 1145    PT Time Calculation (min) 30 min    Activity Tolerance Patient tolerated treatment well    Behavior During Therapy [facility] for tasks assessed/performed                    Past Medical History:  Diagnosis Date   Acute cystitis    Anxiety    Aphasia    transsient aphasia Jan 2012   Asthmatic bronchitis    Cerebrovascular disease    Chest pain, atypical    Diverticulosis of colon    DJD (degenerative joint disease)    Dysphonia    Esophageal stricture    Fibromyalgia    Gastroparesis    GERD (gastroesophageal reflux disease)    Hematuria, microscopic    Hemorrhoids    Hypercholesteremia    IBS (irritable bowel syndrome)    Memory deficits 02/15/2013   Memory loss    RLS (restless legs syndrome)    TIA (transient ischemic attack)    Urinary incontinence    Vitamin D deficiency    Past Surgical History:  Procedure Laterality Date   ABDOMINAL HYSTERECTOMY  1995   corneal transplant for keratonconus     EYE SURGERY  2013   left foot fracture with ORIF  07/2004   Dr. Sharol Given   right bunion surgery     Patient Active Problem List   Diagnosis Date Noted   Pulmonary nodule 09/03/2021   Osteopenia 07/25/2021   Throat irritation 11/28/2019   HLD (hyperlipidemia)    Overactive bladder 05/08/2015   Aphasia    Cervical spondylitis (HCC)    Numbness and tingling 03/21/2015   TIA (transient ischemic attack) 03/21/2015   Physical exam 10/05/2014   Abdominal pain 05/17/2014   Allergic rhinitis 05/17/2014   Fatigue 04/04/2014   Anxiety and depression 07/25/2013   Weight loss 07/25/2013   Memory deficits 02/15/2013   Lumbar spondylosis 09/21/2012   Tachycardia 09/25/2011    Palpitations 09/16/2010   Dysphonia 09/05/2010   Slowing of urinary stream 09/25/2009   Cerebrovascular disease 04/03/2008   DYSPHONIA 04/03/2008   Diverticulosis of large intestine 04/02/2008   Irritable bowel syndrome 04/02/2008   DEGENERATIVE JOINT DISEASE 04/02/2008   HEMATURIA, MICROSCOPIC, HX OF 04/02/2008   Vitamin D deficiency 03/28/2008   Fibromyalgia 03/28/2008   URINARY INCONTINENCE 03/28/2008   GERD 08/02/2007   Gastroparesis 08/02/2007   HEMORRHOIDS 07/30/2007   ESOPHAGEAL STRICTURE 07/30/2007   Anxiety state 07/30/2007    PCP: Annye Asa  REFERRING PROVIDER: Annye Asa  REFERRING DIAG: R07.89  THERAPY DIAG:  Abnormal gait  Muscle weakness (generalized)  Rationale for Evaluation and Treatment Rehabilitation  ONSET DATE: 07/25/21  SUBJECTIVE:  SUBJECTIVE STATEMENT: I am present  PERTINENT HISTORY: Fibromyaglia, dysphonia, anxiety, depression, osteopenia  PAIN:  Are you having pain? 0/10, more numb than anything   PRECAUTIONS: Fall  WEIGHT BEARING RESTRICTIONS No  FALLS:  Has patient fallen in last 6 months? Yes. Number of falls 1 off a ladder  LIVING ENVIRONMENT: Lives with: lives with their spouse Lives in: House/apartment Stairs: No Has following equipment at home: None  OCCUPATION: retired  PLOF: Independent  PATIENT GOALS to get rid of pain   OBJECTIVE:   DIAGNOSTIC FINDINGS:  Right Chest DG 2/14 IMPRESSION: 1. No displaced right rib fracture identified. 2. No acute cardiopulmonary abnormality.  PATIENT SURVEYS:  Quick Dash 29.5   POSTURE: Forward head, rounded shoulders  UPPER EXTREMITY ROM: [facility]  MMT Right eval Left eval 8/21 R 8/21 L  11/13/21  Shoulder flexion (feels a pressure in her chest) 3+ 3+ 4+ 4+ 4+  Shoulder extension        Shoulder abduction 4 4 4+ 4+ 4+  Shoulder adduction       Shoulder internal rotation 3 4 4+ 4+ 4+  Shoulder external rotation 4 4 3+ 3+ 4+  Elbow flexion 4 4 4+ 4+ 4+  Elbow extension 4 4 3+ 3+ 4+  Wrist flexion       Wrist extension       Wrist ulnar deviation       Wrist radial deviation       Wrist pronation       Wrist supination       (Blank rows = not tested)  Horizontal ABD 4/5 & Horizontal ADD 4/5 causes some pressure at xiphoid process  BLE strength: Knee extension: R 4+ L 4 Knee flexion: R 4+ L 4+ Hip flexion: R 4 L 4 Seated hip ABD: 4/5 B  Seated hip extension: 4/5     TODAY'S TREATMENT:  11/13/21 Nustep L5 x11mns  Leg ext 10# x10, 5# LLE x10  HS curls 20# 2x10 Bridges 2x10 Clamshells greenTB 2x10 bilat  Lateral band walks greenTB    11/11/21 Nustep L5 x656ms FGA Resisted side steps 10# over obstacles 5x each side  Leg press 20# 2x10  S2S with press out yellow ball 2x10  Standing on foam hitting ball  11/04/21 NuStel L4 x 5 min  HS curls 20lb 2x12   Leg Ext 5lb 2x10 Gait one lat around back building S2S holding yellow ball 2x10   10/30/21 NuStep L5 x6 min  Leg Ext 10lb 2x12, LLE 5lb x10 x4  HS curls 20lb 2x12   Leg press 20lb 2x12 Tandem walking on airex in // bar  10/23/21 Nustep L5 x5m57m Step up on 8"  Leg ext 10# 3x10, 5# x10 LLE  HS curls 20# 3x10, 10# x10 LLE  Leg press 40# 3x10, 20# x10 LLE   10/21/21 Nustep L5 x6mi66m Step up on airex on 6" Side steps on airex  SL tapping cones  Resisted gait 15#  4 way x4   10/16/21 Nustep L5 x6min49malking outside 1 big loop   Leg ext 10# 2x10, 5# 2x10 HS curls 20# 2x10, 10# 2x10  Leg press 20# x10, 30# x10  Walking on beam      10/14/21  Bridges 1x15  Sidelying clams 1x10 B red TB   Objective measures and appropriate education   10/09/21 NuStep L4 x 5 min Sit to stands holdign yellow ball 2x10 Hamstring curls 20lb 2x10, LLE 10lb 2x5 Leg Extensions 10lb 2x10, LLE 5lb  2x5 Side step on an off airex Shoulder rows and ext red on airex 2x10 PATIENT EDUCATION: Education details: POC Person educated: Patient Education method: Explanation Education comprehension: verbalized understanding   HOME EXERCISE PROGRAM: Access Code: [email or web address] Access Code: [email or web address]  ASSESSMENT:  CLINICAL IMPRESSION: Patient arrives a few minutes ~15 mins late today. Worked on some exercises that she can do at home for LE strengthening. Will establish a HEP for her to take her next visit.   OBJECTIVE IMPAIRMENTS decreased strength and pain.   ACTIVITY LIMITATIONS carrying, lifting, sleeping, and pushing, pulling  PERSONAL FACTORS Age, Behavior pattern, Time since onset of injury/illness/exacerbation, and 3+ comorbidities: CVD, Fibromyalgia, GERD, TIA  are also affecting patient's functional outcome.   REHAB POTENTIAL: Fair if her origin is [facility]  CLINICAL DECISION MAKING: Evolving/moderate complexity  EVALUATION COMPLEXITY: Moderate   GOALS: Goals reviewed with patient? No   SHORT TERM GOALS: Target date: 09/12/21   Patient will be independent with initial HEP.  Goal status: not met not doing any exercises "they never gave me one"      LONG TERM GOALS: Target date: 10/21/21   Patient will demonstrate improved functional UE strength as demonstrated by >=4+/5 in muscle groups. Goal status: met    2.  Patient will report 15 or lower on QUICKDASH to demonstrate improved functional ability.  Goal status: 8/21 45. met   3. Patient will report a decrease in chest pain on NPRS from 8/10 to 5/10 or better.   Baseline: 8/10 Goal status: Met     4. Patient will demonstrate <12s on TUG and 5xSTS to decrease fall risk. Baseline: 8/21- 17 5xSTS, TUG 9 seconds  Goal status: Met  10/09/21   5. Patient will score 25 or higher on FGA to demonstrate improved functional gait to ambulate within the community safely.             Baseline: 21/30, 25/30-9/18            Goal status:  Met 9/18   PLAN: PT FREQUENCY: 1-2x/week  PT DURATION: 6 weeks  PLANNED INTERVENTIONS: Therapeutic exercises, Therapeutic activity, Neuromuscular re-education, Balance training, Gait training, Patient/Family education, Joint mobilization, Cryotherapy, Moist heat, Ionotophoresis 17m/ml Dexamethasone, and Manual therapy  PLAN FOR NEXT SESSION: LE strengthening, balance training    MAndris Baumann DPT 11/13/2021, 11:45 AM

## 2021-11-14 ENCOUNTER — Ambulatory Visit (INDEPENDENT_AMBULATORY_CARE_PROVIDER_SITE_OTHER): Payer: Medicare HMO | Admitting: Orthopedic Surgery

## 2021-11-14 ENCOUNTER — Encounter: Payer: Self-pay | Admitting: Orthopedic Surgery

## 2021-11-14 VITALS — BP 132/72 | HR 78 | Temp 96.7°F | Ht 61.0 in | Wt 118.8 lb

## 2021-11-14 DIAGNOSIS — Z1231 Encounter for screening mammogram for malignant neoplasm of breast: Secondary | ICD-10-CM | POA: Diagnosis not present

## 2021-11-14 DIAGNOSIS — R1310 Dysphagia, unspecified: Secondary | ICD-10-CM | POA: Diagnosis not present

## 2021-11-14 DIAGNOSIS — F419 Anxiety disorder, unspecified: Secondary | ICD-10-CM

## 2021-11-14 DIAGNOSIS — K219 Gastro-esophageal reflux disease without esophagitis: Secondary | ICD-10-CM

## 2021-11-14 DIAGNOSIS — E785 Hyperlipidemia, unspecified: Secondary | ICD-10-CM

## 2021-11-14 DIAGNOSIS — G459 Transient cerebral ischemic attack, unspecified: Secondary | ICD-10-CM | POA: Diagnosis not present

## 2021-11-14 DIAGNOSIS — F32A Depression, unspecified: Secondary | ICD-10-CM

## 2021-11-14 DIAGNOSIS — Z9189 Other specified personal risk factors, not elsewhere classified: Secondary | ICD-10-CM | POA: Diagnosis not present

## 2021-11-14 DIAGNOSIS — M797 Fibromyalgia: Secondary | ICD-10-CM

## 2021-11-14 DIAGNOSIS — M21612 Bunion of left foot: Secondary | ICD-10-CM

## 2021-11-14 DIAGNOSIS — R202 Paresthesia of skin: Secondary | ICD-10-CM

## 2021-11-14 MED ORDER — HYDROXYZINE PAMOATE 25 MG PO CAPS: 25.0000 mg | ORAL_CAPSULE | Freq: Every day | ORAL | 0 refills | Status: DC | PRN

## 2021-11-14 NOTE — Progress Notes (Signed)
Careteam: Patient Care Team: Paige Alanis, NP as PCP - General (Adult Health Nurse Practitioner) Druscilla Brownie, MD as Consulting Physician (Dermatology) Marilynne Halsted, MD as Referring Physician (Ophthalmology) Ward Givens, NP as Nurse Practitioner (Neurology) Robley Fries, MD as Consulting Physician (Urology)  Seen by: Windell Moulding, AGNP-C  PLACE OF SERVICE:  [facility]  Advanced Directive information    Allergies  Allergen Reactions   Morphine Other (See Comments)    REACTION: lethargy/malaise   Metoclopramide Hcl Anxiety and Other (See Comments)    REACTION: anxious and out of her mind   Aricept [Donepezil Hcl]     nausea   Doxycycline Diarrhea   Penicillins Itching and Rash    Has patient had a PCN reaction causing immediate rash, facial/tongue/throat swelling, SOB or lightheadedness with hypotension: No Has patient had a PCN reaction causing severe rash involving mucus membranes or skin necrosis: No Has patient had a PCN reaction that required hospitalization No Has patient had a PCN reaction occurring within the last 10 years: Yes If all of the above answers are "NO", then may proceed with Cephalosporin use.     Prednisone Itching and Rash   Sulfonamide Derivatives Itching and Rash    Chief Complaint  Patient presents with   Medical Management of Chronic Issues    Patient presents today for a 6 week follow-up   Quality Metric Gaps    DEXA scan, COVID#5     HPI: Patient is a 81 y.o. female seen today for medical management of chronic conditions.   Anxiety- continues to have periods of increased anxiety,occurs before leaving house/attending scheduled meeting/event, no panic attacks, she is not taking buspar, on Cymbalta, she is considering talk therapy, does perform deep breathing exercises or meditation  Plan to have right eye cataract surgery in future.   Continues to do PT, no recent falls, ambulating well today.  Future mammogram and  bone density scheduled.   No other health concerns today.     Review of Systems:  Review of Systems  Constitutional:  Negative for chills, fever, malaise/fatigue and weight loss.  HENT:  Negative for hearing loss and sore throat.   Eyes:  Negative for discharge and redness.  Respiratory:  Negative for cough, shortness of breath and wheezing.   Cardiovascular:  Negative for chest pain and leg swelling.  Gastrointestinal:  Positive for heartburn. Negative for abdominal pain, blood in stool, constipation, diarrhea, nausea and vomiting.  Genitourinary:  Negative for dysuria and frequency.  Musculoskeletal:  Positive for joint pain. Negative for falls.  Skin:  Negative for rash.  Neurological:  Negative for dizziness, weakness and headaches.  Psychiatric/Behavioral:  Positive for memory loss. Negative for depression and suicidal ideas. The patient is nervous/anxious. The patient does not have insomnia.     Past Medical History:  Diagnosis Date   Acute cystitis    Anxiety    Aphasia    transsient aphasia Jan 2012   Asthmatic bronchitis    Cerebrovascular disease    Chest pain, atypical    Diverticulosis of colon    DJD (degenerative joint disease)    Dysphonia    Esophageal stricture    Fibromyalgia    Gastroparesis    GERD (gastroesophageal reflux disease)    Hematuria, microscopic    Hemorrhoids    Hypercholesteremia    IBS (irritable bowel syndrome)    Memory deficits 02/15/2013   Memory loss    RLS (restless legs syndrome)    TIA (transient  ischemic attack)    Urinary incontinence    Vitamin D deficiency    Past Surgical History:  Procedure Laterality Date   ABDOMINAL HYSTERECTOMY  1995   corneal transplant for keratonconus     EYE SURGERY  2013   left foot fracture with ORIF  07/2004   Dr. Sharol Given   right bunion surgery     Social History:   reports that she has never smoked. She has never used smokeless tobacco. She reports current alcohol use. She reports that  she does not use drugs.  Family History  Problem Relation Age of Onset   Hypertension Mother    Anemia Mother    Dementia Mother    Kidney disease Father    Hyperlipidemia Sister    Diabetes Brother    Lung cancer Brother    Dementia Maternal Grandmother    Diabetes Other        aunt,brother,uncle   Breast cancer Maternal Aunt     Medications: Patient's Medications  New Prescriptions   No medications on file  Previous Medications   ASPIRIN 325 MG EC TABLET    Take 325 mg by mouth daily.   BIOTIN 1000 MCG TABLET    Take 1,000 mcg by mouth 3 (three) times daily.   BUSPIRONE (BUSPAR) 5 MG TABLET    TAKE 1 TABLET TWICE DAILY   CHOLECALCIFEROL (VITAMIN D) 50 MCG (2000 UT) CAPS    Take by mouth.   COLLAGEN-VITAMIN C PO    Take by mouth.   DULOXETINE (CYMBALTA) 60 MG CAPSULE    TAKE 1 CAPSULE EVERY DAY   FLUTICASONE (FLONASE) 50 MCG/ACT NASAL SPRAY    Place 2 sprays into both nostrils daily as needed for allergies.   OMEGA-3 FATTY ACIDS (FISH OIL) 1000 MG CAPS    Take by mouth.   OMEPRAZOLE (PRILOSEC) 40 MG CAPSULE    TAKE 1 CAPSULE TWICE DAILY   PREDNISOLONE ACETATE (PRED FORTE) 1 % OPHTHALMIC SUSPENSION    Place 1 drop into both eyes as directed.  Modified Medications   No medications on file  Discontinued Medications   No medications on file    Physical Exam:  Vitals:   11/14/21 1332  BP: 132/72  Pulse: 78  Temp: (!) 96.7 F (35.9 C)  SpO2: 98%  Weight: 118 lb 12.8 oz (53.9 kg)  Height: '5\' 1"'$  (1.549 m)   Body mass index is 22.45 kg/m. Wt Readings from Last 3 Encounters:  11/14/21 118 lb 12.8 oz (53.9 kg)  10/03/21 120 lb 6.4 oz (54.6 kg)  09/23/21 118 lb (53.5 kg)    Physical Exam Vitals reviewed.  Constitutional:      General: She is not in acute distress. HENT:     Head: Normocephalic.  Eyes:     General:        Right eye: No discharge.        Left eye: No discharge.  Cardiovascular:     Rate and Rhythm: Normal rate and regular rhythm.     Pulses:  Normal pulses.     Heart sounds: Normal heart sounds.  Pulmonary:     Effort: Pulmonary effort is normal. No respiratory distress.     Breath sounds: Normal breath sounds. No wheezing.  Abdominal:     General: Bowel sounds are normal. There is no distension.     Palpations: Abdomen is soft.     Tenderness: There is no abdominal tenderness.  Musculoskeletal:     Cervical back: Neck supple.  Right lower leg: No edema.     Left lower leg: No edema.     Comments: Poor posture  Lymphadenopathy:     Cervical: No cervical adenopathy.  Skin:    General: Skin is warm and dry.     Capillary Refill: Capillary refill takes less than 2 seconds.  Neurological:     General: No focal deficit present.     Mental Status: She is alert and oriented to person, place, and time.  Psychiatric:        Mood and Affect: Mood normal.        Behavior: Behavior normal.     Labs reviewed: Basic Metabolic Panel: Recent Labs    01/31/21 1522 07/25/21 1446  NA 139 139  K 4.0 3.8  CL 101 102  CO2 31 30  GLUCOSE 78 83  BUN 16 20  CREATININE 0.86 0.79  CALCIUM 9.4 9.4  TSH 2.13 1.30   Liver Function Tests: Recent Labs    01/31/21 1522 07/25/21 1446  AST 27 21  ALT 13 11  ALKPHOS 71 83  BILITOT 0.4 0.5  PROT 6.5 7.0  ALBUMIN 3.9 4.1   No results for input(s): "LIPASE", "AMYLASE" in the last 8760 hours. No results for input(s): "AMMONIA" in the last 8760 hours. CBC: Recent Labs    01/31/21 1522 07/25/21 1446  WBC 3.7* 4.6  NEUTROABS 2.1 3.2  HGB 12.7 13.2  HCT 37.9 39.7  MCV 90.6 90.6  PLT 151.0 158.0   Lipid Panel: Recent Labs    01/31/21 1522 07/25/21 1446  CHOL 214* 228*  HDL 80.90 78.10  LDLCALC 120* 137*  TRIG 63.0 65.0  CHOLHDL 3 3   TSH: Recent Labs    01/31/21 1522 07/25/21 1446  TSH 2.13 1.30   A1C: Lab Results  Component Value Date   HGBA1C 5.6 03/21/2015     Assessment/Plan 1. Anxiety and depression - ongoing - increased anxiety when leaving  home - discontinue Buspar - discussed talk therapy, meditation, deep breathing, aroma therapy with lavender - cont Cymbalta - start hydroxyzine for panic attacks - hydrOXYzine (VISTARIL) 25 MG capsule; Take 1 capsule (25 mg total) by mouth daily as needed. For panic attack or increased anxiety  Dispense: 30 capsule; Refill: 0  2. At risk for decreased bone density - DG Bone Density; Future  3. Encounter for screening mammogram for malignant neoplasm of breast - MM Digital Screening; Future  4. Bunion of left foot - h/o ORIF left foot 2006, right bunion removal - scheduled to see podiatry 09/25  5. Hyperlipidemia, unspecified hyperlipidemia type - total cholesterol 228, LDL 137 07/25/2021 - diet controlled - limit processed and friends foods in diet - lipid panel- future  6. Paresthesia of left foot - no recent falls - followed by neuro - cont PT  7. TIA (transient ischemic attack) - episodes in 2017, 2013 - cont full dose aspirin  8. Gastroesophageal reflux disease without esophagitis - cont omeprazole  9. Dysphagia, unspecified type - no recent aspirations - 07/07 barium swallow normal  10. Fibromyalgia - cont Cymbalta   Total time: 33 minutes. Greater than 50% of total time spent doing patient education regarding anxiety/depression including symptom/medication management, and health maintenance.   Next appt: Visit date not found  [facility] [phone]

## 2021-11-14 NOTE — Patient Instructions (Addendum)
Try meditation video on Youtube- also look up deep breathing techniques  May try lavender essential oils to help with anxiety- apply to bedtime pillow or wrists   Enroll in talk therapy  STOP BUSPAR, START HYDROXYZINE "AS NEEDED" FOR PANIC/ INCREASED ANXIETY  Please call [facility] and schedule mammogram and bone density  Flu vaccine given today  Ok to get covid booster at [employer]

## 2021-11-15 ENCOUNTER — Other Ambulatory Visit: Payer: Self-pay

## 2021-11-15 DIAGNOSIS — Z9189 Other specified personal risk factors, not elsewhere classified: Secondary | ICD-10-CM

## 2021-11-15 DIAGNOSIS — Z1231 Encounter for screening mammogram for malignant neoplasm of breast: Secondary | ICD-10-CM

## 2021-11-17 NOTE — Assessment & Plan Note (Signed)
Chronic problem.  Check labs.  Adjust meds prn  

## 2021-11-17 NOTE — Assessment & Plan Note (Signed)
Pt's PE WNL w/ exception of lower sternal tenderness after moving furniture.  UTD on mammo and PNA.  DEXA ordered.  Check labs.  Anticipatory guidance provided.

## 2021-11-17 NOTE — Assessment & Plan Note (Signed)
Check labs and replete prn. 

## 2021-11-17 NOTE — Assessment & Plan Note (Signed)
Check labs to r/o metabolic cause or vitamin deficiency.

## 2021-11-18 ENCOUNTER — Ambulatory Visit: Payer: Medicare HMO

## 2021-11-18 ENCOUNTER — Ambulatory Visit (INDEPENDENT_AMBULATORY_CARE_PROVIDER_SITE_OTHER): Payer: Medicare HMO

## 2021-11-18 ENCOUNTER — Ambulatory Visit: Payer: Medicare HMO | Admitting: Podiatry

## 2021-11-18 DIAGNOSIS — R269 Unspecified abnormalities of gait and mobility: Secondary | ICD-10-CM | POA: Diagnosis not present

## 2021-11-18 DIAGNOSIS — M21619 Bunion of unspecified foot: Secondary | ICD-10-CM

## 2021-11-18 DIAGNOSIS — M6281 Muscle weakness (generalized): Secondary | ICD-10-CM

## 2021-11-18 DIAGNOSIS — R0789 Other chest pain: Secondary | ICD-10-CM | POA: Diagnosis not present

## 2021-11-18 DIAGNOSIS — M21612 Bunion of left foot: Secondary | ICD-10-CM

## 2021-11-18 NOTE — Progress Notes (Signed)
Chief Complaint  Patient presents with   Bunions    Patient is here for left foot pain she states that she has a bunion on her left foot and her foot hurts.    Subjective: 81 y.o. female presents today as a new patient for evaluation of a symptomatic bunion to the left foot.  Patient states that several years prior she sustained a fracture to her left foot and underwent surgery.  Since that time she has had pain and tenderness to her left great toe joint and she says over the last few years her bunion deformity has actually enlarged and become more symptomatic.  She wears good supportive tennis shoes.  She presents to have it evaluated today  Past Medical History:  Diagnosis Date   Acute cystitis    Anxiety    Aphasia    transsient aphasia Jan 2012   Asthmatic bronchitis    Cerebrovascular disease    Chest pain, atypical    Diverticulosis of colon    DJD (degenerative joint disease)    Dysphonia    Esophageal stricture    Fibromyalgia    Gastroparesis    GERD (gastroesophageal reflux disease)    Hematuria, microscopic    Hemorrhoids    Hypercholesteremia    IBS (irritable bowel syndrome)    Memory deficits 02/15/2013   Memory loss    RLS (restless legs syndrome)    TIA (transient ischemic attack)    Urinary incontinence    Vitamin D deficiency     Past Surgical History:  Procedure Laterality Date   ABDOMINAL HYSTERECTOMY  1995   corneal transplant for keratonconus     EYE SURGERY  2013   left foot fracture with ORIF  07/2004   Dr. Sharol Given   right bunion surgery      Allergies  Allergen Reactions   Morphine Other (See Comments)    REACTION: lethargy/malaise   Metoclopramide Hcl Anxiety and Other (See Comments)    REACTION: anxious and out of her mind   Aricept [Donepezil Hcl]     nausea   Doxycycline Diarrhea   Penicillins Itching and Rash    Has patient had a PCN reaction causing immediate rash, facial/tongue/throat swelling, SOB or lightheadedness with  hypotension: No Has patient had a PCN reaction causing severe rash involving mucus membranes or skin necrosis: No Has patient had a PCN reaction that required hospitalization No Has patient had a PCN reaction occurring within the last 10 years: Yes If all of the above answers are "NO", then may proceed with Cephalosporin use.     Prednisone Itching and Rash   Sulfonamide Derivatives Itching and Rash     Objective: Physical Exam General: The patient is alert and oriented x3 in no acute distress.  Dermatology: Skin is cool, dry and supple bilateral lower extremities. Negative for open lesions or macerations.  Vascular: Palpable pedal pulses bilaterally. No edema or erythema noted. Capillary refill within normal limits.  Neurological: Epicritic and protective threshold grossly intact bilaterally.   Musculoskeletal Exam: Clinical evidence of bunion deformity noted to the respective foot. There is moderate pain on palpation range of motion of the first MPJ. Lateral deviation of the hallux noted consistent with hallux abductovalgus.  Radiographic Exam: Normal osseous mineralization.  No acute fractures identified.  Orthopedic hardware noted to the first metatarsal cuneiform as well as the second metatarsal cuneiform joints consistent with the patient's given history of midfoot fracture several years prior.  Increased intermetatarsal angle greater than 15  with a hallux abductus angle greater than 30 noted on AP view.   Assessment: 1.  Hallux valgus left 2. H/o ORIF LT midfoot fracture/dislocations several years prior  Plan of Care:  1. Patient was evaluated. X-Rays reviewed. 2.  Today we discussed the bunion deformity as well as surgery in detail and postoperative recovery course. 3.  After a very detailed discussion regarding bunion surgery and postoperative recovery course, I asked the patient if her bunion was affecting her daily quality of life and painful on a daily basis.  Patient  states that she would like to go home and think about how much it affects her and if she would like to proceed with surgery. 4.  In the meantime recommend wide fitting shoes that allow plenty of support 5.  Return to clinic as needed   Edrick Kins, DPM [facility]  Dr. Edrick Kins, DPM    [address], [place] [address]                Office [phone]  Fax [phone]

## 2021-11-18 NOTE — Assessment & Plan Note (Signed)
Recurrent problem for pt.  She states that despite sleeping for 11-12 hrs she finds herself tired.  Is only eating 2 meals/day.  Admits to inadequate water intake.  Will check labs to r/o underlying cause.  Encouraged increased caloric intake and improved water intake.  Will follow.

## 2021-11-18 NOTE — Therapy (Signed)
OUTPATIENT PHYSICAL THERAPY TREATMENT     Patient Name: Paige Pena MRN: [id number] DOB:1940/11/15, 81 y.o., female Today's Date: 11/18/2021   PT End of Session - 11/18/21 1104     Visit Number 19    Date for PT Re-Evaluation 11/22/21    PT Start Time 57    PT Stop Time 1145    PT Time Calculation (min) 45 min    Activity Tolerance Patient tolerated treatment well    Behavior During Therapy [facility] for tasks assessed/performed                     Past Medical History:  Diagnosis Date   Acute cystitis    Anxiety    Aphasia    transsient aphasia Jan 2012   Asthmatic bronchitis    Cerebrovascular disease    Chest pain, atypical    Diverticulosis of colon    DJD (degenerative joint disease)    Dysphonia    Esophageal stricture    Fibromyalgia    Gastroparesis    GERD (gastroesophageal reflux disease)    Hematuria, microscopic    Hemorrhoids    Hypercholesteremia    IBS (irritable bowel syndrome)    Memory deficits 02/15/2013   Memory loss    RLS (restless legs syndrome)    TIA (transient ischemic attack)    Urinary incontinence    Vitamin D deficiency    Past Surgical History:  Procedure Laterality Date   ABDOMINAL HYSTERECTOMY  1995   corneal transplant for keratonconus     EYE SURGERY  2013   left foot fracture with ORIF  07/2004   Dr. Sharol Given   right bunion surgery     Patient Active Problem List   Diagnosis Date Noted   Pulmonary nodule 09/03/2021   Osteopenia 07/25/2021   Throat irritation 11/28/2019   HLD (hyperlipidemia)    Overactive bladder 05/08/2015   Aphasia    Cervical spondylitis (HCC)    Numbness and tingling 03/21/2015   TIA (transient ischemic attack) 03/21/2015   Physical exam 10/05/2014   Abdominal pain 05/17/2014   Allergic rhinitis 05/17/2014   Fatigue 04/04/2014   Anxiety and depression 07/25/2013   Weight loss 07/25/2013   Memory deficits 02/15/2013   Lumbar spondylosis 09/21/2012   Tachycardia 09/25/2011    Palpitations 09/16/2010   Dysphonia 09/05/2010   Slowing of urinary stream 09/25/2009   Cerebrovascular disease 04/03/2008   DYSPHONIA 04/03/2008   Diverticulosis of large intestine 04/02/2008   Irritable bowel syndrome 04/02/2008   DEGENERATIVE JOINT DISEASE 04/02/2008   HEMATURIA, MICROSCOPIC, HX OF 04/02/2008   Vitamin D deficiency 03/28/2008   Fibromyalgia 03/28/2008   URINARY INCONTINENCE 03/28/2008   GERD 08/02/2007   Gastroparesis 08/02/2007   HEMORRHOIDS 07/30/2007   ESOPHAGEAL STRICTURE 07/30/2007   Anxiety state 07/30/2007    PCP: Annye Asa  REFERRING PROVIDER: Annye Asa  REFERRING DIAG: R07.89  THERAPY DIAG:  Abnormal gait  Muscle weakness (generalized)  Rationale for Evaluation and Treatment Rehabilitation  ONSET DATE: 07/25/21  SUBJECTIVE:  SUBJECTIVE STATEMENT: Feeling pretty good, stressed bc of my eye surgery coming up.   PERTINENT HISTORY: Fibromyaglia, dysphonia, anxiety, depression, osteopenia  PAIN:  Are you having pain? 0/10, more numb than anything   PRECAUTIONS: Fall  WEIGHT BEARING RESTRICTIONS No  FALLS:  Has patient fallen in last 6 months? Yes. Number of falls 1 off a ladder  LIVING ENVIRONMENT: Lives with: lives with their spouse Lives in: House/apartment Stairs: No Has following equipment at home: None  OCCUPATION: retired  PLOF: Independent  PATIENT GOALS to get rid of pain   OBJECTIVE:   DIAGNOSTIC FINDINGS:  Right Chest DG 2/14 IMPRESSION: 1. No displaced right rib fracture identified. 2. No acute cardiopulmonary abnormality.  PATIENT SURVEYS:  Quick Dash 29.5   POSTURE: Forward head, rounded shoulders  UPPER EXTREMITY ROM: [facility]  MMT Right eval Left eval 8/21 R 8/21 L  11/13/21  Shoulder flexion (feels a pressure  in her chest) 3+ 3+ 4+ 4+ 4+  Shoulder extension       Shoulder abduction 4 4 4+ 4+ 4+  Shoulder adduction       Shoulder internal rotation 3 4 4+ 4+ 4+  Shoulder external rotation 4 4 3+ 3+ 4+  Elbow flexion 4 4 4+ 4+ 4+  Elbow extension 4 4 3+ 3+ 4+  Wrist flexion       Wrist extension       Wrist ulnar deviation       Wrist radial deviation       Wrist pronation       Wrist supination       (Blank rows = not tested)  Horizontal ABD 4/5 & Horizontal ADD 4/5 causes some pressure at xiphoid process  BLE strength: Knee extension: R 4+ L 4 Knee flexion: R 4+ L 4+ Hip flexion: R 4 L 4 Seated hip ABD: 4/5 B  Seated hip extension: 4/5     TODAY'S TREATMENT:  11/18/21 NuStep L5 x2mns   HEP review  - bridges 3x10 - clamshells greenTB 3x10 - lateral band walks  - 3 way kicks with band x10 each LE   11/13/21 Nustep L5 x534ms  Leg ext 10# x10, 5# LLE x10  HS curls 20# 2x10 Bridges 2x10 Clamshells greenTB 2x10 bilat  Lateral band walks greenTB    11/11/21 Nustep L5 x6m53m FGA Resisted side steps 10# over obstacles 5x each side  Leg press 20# 2x10  S2S with press out yellow ball 2x10  Standing on foam hitting ball  11/04/21 NuStel L4 x 5 min  HS curls 20lb 2x12   Leg Ext 5lb 2x10 Gait one lat around back building S2S holding yellow ball 2x10   10/30/21 NuStep L5 x6 min  Leg Ext 10lb 2x12, LLE 5lb x10 x4  HS curls 20lb 2x12   Leg press 20lb 2x12 Tandem walking on airex in // bar  10/23/21 Nustep L5 x5mi79mStep up on 8"  Leg ext 10# 3x10, 5# x10 LLE  HS curls 20# 3x10, 10# x10 LLE  Leg press 40# 3x10, 20# x10 LLE   10/21/21 Nustep L5 x6min40mStep up on airex on 6" Side steps on airex  SL tapping cones  Resisted gait 15#  4 way x4   10/16/21 Nustep L5 x6mins12mlking outside 1 big loop   Leg ext 10# 2x10, 5# 2x10 HS curls 20# 2x10, 10# 2x10  Leg press 20# x10, 30# x10  Walking on beam      10/14/21  Bridges 1x15  Sidelying  clams 1x10 B red TB    Objective measures and appropriate education   10/09/21 NuStep L4 x 5 min Sit to stands holdign yellow ball 2x10 Hamstring curls 20lb 2x10, LLE 10lb 2x5 Leg Extensions 10lb 2x10, LLE 5lb 2x5 Side step on an off airex Shoulder rows and ext red on airex 2x10 PATIENT EDUCATION: Education details: POC Person educated: Patient Education method: Explanation Education comprehension: verbalized understanding   HOME EXERCISE PROGRAM: Access Code: [email or web address] Access Code: [email or web address]  ASSESSMENT:  CLINICAL IMPRESSION: We established a HEP so she can have some exercises that she can do at home for LE strengthening. Also went over a few gym machines she can go on if she chooses to go to the gym. D/c next visit.   OBJECTIVE IMPAIRMENTS decreased strength and pain.   ACTIVITY LIMITATIONS carrying, lifting, sleeping, and pushing, pulling  PERSONAL FACTORS Age, Behavior pattern, Time since onset of injury/illness/exacerbation, and 3+ comorbidities: CVD, Fibromyalgia, GERD, TIA  are also affecting patient's functional outcome.   REHAB POTENTIAL: Fair if her origin is [place]  CLINICAL DECISION MAKING: Evolving/moderate complexity  EVALUATION COMPLEXITY: Moderate   GOALS: Goals reviewed with patient? No   SHORT TERM GOALS: Target date: 09/12/21   Patient will be independent with initial HEP.  Goal status: met     LONG TERM GOALS: Target date: 10/21/21   Patient will demonstrate improved functional UE strength as demonstrated by >=4+/5 in muscle groups. Goal status: met    2.  Patient will report 15 or lower on QUICKDASH to demonstrate improved functional ability.  Goal status: 8/21 45. met   3. Patient will report a decrease in chest pain on NPRS from 8/10 to 5/10 or better.   Baseline: 8/10 Goal status: Met     4. Patient will demonstrate <12s on TUG and 5xSTS to decrease fall risk. Baseline: 8/21- 17 5xSTS, TUG 9 seconds  Goal status: Met  10/09/21   5. Patient will score 25 or  higher on FGA to demonstrate improved functional gait to ambulate within the community safely.             Baseline: 21/30, 25/30-9/18            Goal status: Met 9/18   PLAN: PT FREQUENCY: 1-2x/week  PT DURATION: 6 weeks  PLANNED INTERVENTIONS: Therapeutic exercises, Therapeutic activity, Neuromuscular re-education, Balance training, Gait training, Patient/Family education, Joint mobilization, Cryotherapy, Moist heat, Ionotophoresis 66m/ml Dexamethasone, and Manual therapy  PLAN FOR NEXT SESSION: LE strengthening, balance training    MAndris Baumann DPT 11/18/2021, 11:44 AM

## 2021-11-20 ENCOUNTER — Ambulatory Visit: Payer: Medicare HMO

## 2021-11-20 DIAGNOSIS — R351 Nocturia: Secondary | ICD-10-CM | POA: Diagnosis not present

## 2021-11-20 DIAGNOSIS — R3121 Asymptomatic microscopic hematuria: Secondary | ICD-10-CM | POA: Diagnosis not present

## 2021-11-21 ENCOUNTER — Ambulatory Visit: Payer: Medicare HMO | Admitting: Adult Health

## 2021-12-03 DIAGNOSIS — M85851 Other specified disorders of bone density and structure, right thigh: Secondary | ICD-10-CM | POA: Diagnosis not present

## 2021-12-03 DIAGNOSIS — M85852 Other specified disorders of bone density and structure, left thigh: Secondary | ICD-10-CM | POA: Diagnosis not present

## 2021-12-06 ENCOUNTER — Ambulatory Visit: Payer: Medicare HMO | Admitting: Adult Health

## 2021-12-06 ENCOUNTER — Encounter: Payer: Self-pay | Admitting: Adult Health

## 2021-12-06 ENCOUNTER — Encounter: Payer: Medicare HMO | Admitting: Family

## 2021-12-06 VITALS — BP 118/80 | HR 70 | Temp 97.8°F | Resp 18 | Ht 60.0 in | Wt 121.0 lb

## 2021-12-06 DIAGNOSIS — F419 Anxiety disorder, unspecified: Secondary | ICD-10-CM | POA: Diagnosis not present

## 2021-12-06 DIAGNOSIS — E785 Hyperlipidemia, unspecified: Secondary | ICD-10-CM

## 2021-12-06 DIAGNOSIS — M797 Fibromyalgia: Secondary | ICD-10-CM

## 2021-12-06 DIAGNOSIS — G629 Polyneuropathy, unspecified: Secondary | ICD-10-CM | POA: Diagnosis not present

## 2021-12-06 DIAGNOSIS — R002 Palpitations: Secondary | ICD-10-CM

## 2021-12-06 MED ORDER — VITAMIN B-12 1000 MCG PO TABS: 1000.0000 ug | ORAL_TABLET | Freq: Every day | ORAL | 3 refills | Status: AC

## 2021-12-06 MED ORDER — ATORVASTATIN CALCIUM 20 MG PO TABS: 20.0000 mg | ORAL_TABLET | ORAL | 3 refills | Status: DC

## 2021-12-06 MED ORDER — VITAMIN B-12 1000 MCG PO TABS: 1000.0000 ug | ORAL_TABLET | Freq: Every day | ORAL | 0 refills | Status: DC

## 2021-12-06 NOTE — Patient Instructions (Signed)

## 2021-12-06 NOTE — Progress Notes (Unsigned)
[facility] clinic  Provider:   Code Status: *** Goals of Care:     10/21/2021    3:37 PM  Advanced Directives  Does Patient Have a Medical Advance Directive? No  Would patient like information on creating a medical advance directive? No - Patient declined     Chief Complaint  Patient presents with   Acute Visit    Patient is here for heart palpitations     HPI: Patient is a 81 y.o. female seen today for an acute visit for  Past Medical History:  Diagnosis Date   Acute cystitis    Anxiety    Aphasia    transsient aphasia Jan 2012   Asthmatic bronchitis    Cerebrovascular disease    Chest pain, atypical    Diverticulosis of colon    DJD (degenerative joint disease)    Dysphonia    Esophageal stricture    Fibromyalgia    Gastroparesis    GERD (gastroesophageal reflux disease)    Hematuria, microscopic    Hemorrhoids    Hypercholesteremia    IBS (irritable bowel syndrome)    Memory deficits 02/15/2013   Memory loss    RLS (restless legs syndrome)    TIA (transient ischemic attack)    Urinary incontinence    Vitamin D deficiency     Past Surgical History:  Procedure Laterality Date   ABDOMINAL HYSTERECTOMY  1995   corneal transplant for keratonconus     EYE SURGERY  2013   left foot fracture with ORIF  07/2004   Dr. Sharol Given   right bunion surgery      Allergies  Allergen Reactions   Morphine Other (See Comments)    REACTION: lethargy/malaise   Metoclopramide Hcl Anxiety and Other (See Comments)    REACTION: anxious and out of her mind   Aricept [Donepezil Hcl]     nausea   Doxycycline Diarrhea   Penicillins Itching and Rash    Has patient had a PCN reaction causing immediate rash, facial/tongue/throat swelling, SOB or lightheadedness with hypotension: No Has patient had a PCN reaction causing severe rash involving mucus membranes or skin necrosis: No Has patient had a PCN reaction that required hospitalization No Has patient had a PCN reaction occurring  within the last 10 years: Yes If all of the above answers are "NO", then may proceed with Cephalosporin use.     Prednisone Itching and Rash   Sulfonamide Derivatives Itching and Rash    Outpatient Encounter Medications as of 12/06/2021  Medication Sig   aspirin 325 MG EC tablet Take 325 mg by mouth daily.   Biotin 1000 MCG tablet Take 1,000 mcg by mouth 3 (three) times daily.   Cholecalciferol (VITAMIN D) 50 MCG (2000 UT) CAPS Take by mouth.   COLLAGEN-VITAMIN C PO Take by mouth.   DULoxetine (CYMBALTA) 60 MG capsule TAKE 1 CAPSULE EVERY DAY   fluticasone (FLONASE) 50 MCG/ACT nasal spray Place 2 sprays into both nostrils daily as needed for allergies.   hydrOXYzine (VISTARIL) 25 MG capsule Take 1 capsule (25 mg total) by mouth daily as needed. For panic attack or increased anxiety   Omega-3 Fatty Acids (FISH OIL) 1000 MG CAPS Take by mouth.   omeprazole (PRILOSEC) 40 MG capsule TAKE 1 CAPSULE TWICE DAILY   prednisoLONE acetate (PRED FORTE) 1 % ophthalmic suspension Place 1 drop into both eyes as directed.   No facility-administered encounter medications on file as of 12/06/2021.    Review of Systems:  Review of  Systems  Constitutional:  Negative for appetite change, chills, fatigue and fever.  HENT:  Negative for congestion, hearing loss, rhinorrhea and sore throat.   Eyes: Negative.   Respiratory:  Negative for cough, shortness of breath and wheezing.   Cardiovascular:  Negative for chest pain, palpitations and leg swelling.  Gastrointestinal:  Negative for abdominal pain, constipation, diarrhea, nausea and vomiting.  Genitourinary:  Negative for dysuria.  Musculoskeletal:  Negative for arthralgias, back pain and myalgias.  Skin:  Negative for color change, rash and wound.  Neurological:  Negative for dizziness, weakness and headaches.  Psychiatric/Behavioral:  Negative for behavioral problems. The patient is not nervous/anxious.     Health Maintenance  Topic Date Due    DEXA SCAN  07/10/2021   INFLUENZA VACCINE  09/24/2021   COVID-19 Vaccine (5 - Pfizer series) 10/04/2021   TETANUS/TDAP  07/26/2022 (Originally 05/26/1959)   MAMMOGRAM  03/06/2022   Pneumonia Vaccine 61+ Years old  Completed   HPV VACCINES  Aged Out   Zoster Vaccines- Shingrix  Discontinued    Physical Exam: Vitals:   12/06/21 1440  BP: 118/80  Pulse: 70  Resp: 18  Temp: 97.8 F (36.6 C)  SpO2: 98%  Weight: 121 lb (54.9 kg)  Height: 5' (1.524 m)   Body mass index is 23.63 kg/m. Physical Exam Constitutional:      Appearance: Normal appearance.  HENT:     Head: Normocephalic and atraumatic.     Nose: Nose normal.     Mouth/Throat:     Mouth: Mucous membranes are moist.  Eyes:     Conjunctiva/sclera: Conjunctivae normal.  Cardiovascular:     Rate and Rhythm: Normal rate and regular rhythm.  Pulmonary:     Effort: Pulmonary effort is normal.     Breath sounds: Normal breath sounds.  Abdominal:     General: Bowel sounds are normal.     Palpations: Abdomen is soft.  Musculoskeletal:        General: Normal range of motion.     Cervical back: Normal range of motion.  Skin:    General: Skin is warm and dry.  Neurological:     General: No focal deficit present.     Mental Status: She is alert and oriented to person, place, and time.  Psychiatric:        Mood and Affect: Mood normal.        Behavior: Behavior normal.        Thought Content: Thought content normal.        Judgment: Judgment normal.     Labs reviewed: Basic Metabolic Panel: Recent Labs    01/31/21 1522 07/25/21 1446  NA 139 139  K 4.0 3.8  CL 101 102  CO2 31 30  GLUCOSE 78 83  BUN 16 20  CREATININE 0.86 0.79  CALCIUM 9.4 9.4  TSH 2.13 1.30   Liver Function Tests: Recent Labs    01/31/21 1522 07/25/21 1446  AST 27 21  ALT 13 11  ALKPHOS 71 83  BILITOT 0.4 0.5  PROT 6.5 7.0  ALBUMIN 3.9 4.1   No results for input(s): "LIPASE", "AMYLASE" in the last 8760 hours. No results for  input(s): "AMMONIA" in the last 8760 hours. CBC: Recent Labs    01/31/21 1522 07/25/21 1446  WBC 3.7* 4.6  NEUTROABS 2.1 3.2  HGB 12.7 13.2  HCT 37.9 39.7  MCV 90.6 90.6  PLT 151.0 158.0   Lipid Panel: Recent Labs    01/31/21 1522 07/25/21 1446  CHOL 214* 228*  HDL 80.90 78.10  LDLCALC 120* 137*  TRIG 63.0 65.0  CHOLHDL 3 3   Lab Results  Component Value Date   HGBA1C 5.6 03/21/2015    Procedures since last visit: DG Foot Complete Left  Result Date: 11/18/2021 Please see detailed radiograph report in office note.   Assessment/Plan There are no diagnoses linked to this encounter.   Labs/tests ordered:    Next appt:  Visit date not found

## 2021-12-15 NOTE — Progress Notes (Signed)
This encounter was created in error - please disregard. Error   

## 2021-12-19 DIAGNOSIS — M797 Fibromyalgia: Secondary | ICD-10-CM | POA: Diagnosis not present

## 2021-12-19 DIAGNOSIS — K219 Gastro-esophageal reflux disease without esophagitis: Secondary | ICD-10-CM | POA: Diagnosis not present

## 2021-12-19 DIAGNOSIS — Z8673 Personal history of transient ischemic attack (TIA), and cerebral infarction without residual deficits: Secondary | ICD-10-CM | POA: Diagnosis not present

## 2021-12-19 DIAGNOSIS — Z947 Corneal transplant status: Secondary | ICD-10-CM | POA: Diagnosis not present

## 2021-12-19 DIAGNOSIS — F32A Depression, unspecified: Secondary | ICD-10-CM | POA: Diagnosis not present

## 2021-12-19 DIAGNOSIS — H25811 Combined forms of age-related cataract, right eye: Secondary | ICD-10-CM | POA: Diagnosis not present

## 2021-12-19 DIAGNOSIS — F419 Anxiety disorder, unspecified: Secondary | ICD-10-CM | POA: Diagnosis not present

## 2021-12-19 HISTORY — PX: OTHER SURGICAL HISTORY: SHX169

## 2021-12-20 ENCOUNTER — Telehealth: Payer: Self-pay

## 2021-12-20 ENCOUNTER — Other Ambulatory Visit: Payer: Self-pay | Admitting: *Deleted

## 2021-12-20 DIAGNOSIS — F32A Depression, unspecified: Secondary | ICD-10-CM

## 2021-12-20 DIAGNOSIS — K219 Gastro-esophageal reflux disease without esophagitis: Secondary | ICD-10-CM

## 2021-12-20 MED ORDER — OMEPRAZOLE 40 MG PO CPDR: DELAYED_RELEASE_CAPSULE | ORAL | 1 refills | Status: AC

## 2021-12-20 MED ORDER — DULOXETINE HCL 60 MG PO CPEP: 60.0000 mg | ORAL_CAPSULE | Freq: Every day | ORAL | 1 refills | Status: DC

## 2021-12-20 NOTE — Telephone Encounter (Signed)
Patient called wanting to know her results from her dexa scan. Located under media tab dated 12/04/2021 from [facility]. Routed to PCP

## 2021-12-20 NOTE — Telephone Encounter (Signed)
[facility] requested refill.

## 2021-12-20 NOTE — Telephone Encounter (Signed)
DEXA scan indicates mild osteopenia (noted in radius) other extremities normal. Recommend starting Caltrate (calcium/vitamin D) supplement daily if not already on one. Weight bearing exercise like light walking also beneficial for bone health.

## 2021-12-24 ENCOUNTER — Ambulatory Visit: Payer: Medicare HMO | Admitting: Neurology

## 2022-01-01 ENCOUNTER — Encounter: Payer: Self-pay | Admitting: Cardiology

## 2022-01-01 NOTE — Progress Notes (Signed)
Cardiology Office Note   Date:  01/03/2022   ID:  Paige Pena, DOB April 16, 1940, MRN [id number]  PCP:  Yvonna Alanis, NP  Cardiologist:   None Referring:  Yvonna Alanis, NP   Chief Complaint  Patient presents with   Palpitations      History of Present Illness: Paige Pena is a 81 y.o. female who presents for evaluation of palpitations.       She has had some TIAs or strokes  Echo in 2017 as part of a neurology work up  demonstrated no significant abnormalities.  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  Her daughter was on the phone from [place].  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  The patient says she is been getting palpitations for about a month.  These are happening in the mid chest.  She will feel it skipping or fluttering or racing which cannot really quantify or qualify.  It happens more at bedtime.  She cannot bring it on with activity.  She has not been doing as much activity right now because she is having vision problems.  She has not had any presyncope or syncope.  She is not having any chest pressure, neck or arm discomfort.  She is not having any shortness of breath, PND or orthopnea.  She had no other cardiac work-up.  She has been doing some physical therapy because she pulled a muscle in her chest.  Past Medical History:  Diagnosis Date   Acute cystitis    Anxiety    Aphasia    transsient aphasia Jan 2012   Asthmatic bronchitis    Cerebrovascular disease    Diverticulosis of colon    DJD (degenerative joint disease)    Dysphonia    Esophageal stricture    Fibromyalgia    Gastroparesis    GERD (gastroesophageal reflux disease)    Hematuria, microscopic    Hemorrhoids    Hypercholesteremia    IBS (irritable bowel syndrome)    Memory deficits 02/15/2013   RLS (restless legs syndrome)    TIA (transient ischemic attack)     Urinary incontinence    Vitamin D deficiency     Past Surgical History:  Procedure Laterality Date   ABDOMINAL HYSTERECTOMY  1995   corneal transplant for keratonconus     EYE SURGERY  2013   left foot fracture with ORIF  07/2004   Dr. Sharol Given   right bunion surgery       Current Outpatient Medications  Medication Sig Dispense Refill   aspirin 325 MG EC tablet Take 325 mg by mouth daily.     atorvastatin (LIPITOR) 20 MG tablet Take 1 tablet (20 mg total) by mouth every Monday, Wednesday, and Friday. 30 tablet 3   Biotin 1000 MCG tablet Take 1,000 mcg by mouth 3 (three) times daily.     Cholecalciferol (VITAMIN D) 50 MCG (2000 UT) CAPS Take by mouth.     COLLAGEN-VITAMIN C PO Take by mouth.     cyanocobalamin (VITAMIN B12) 1000 MCG tablet Take 1 tablet (1,000 mcg total) by mouth daily. 30 tablet 3   DULoxetine (CYMBALTA) 60 MG capsule Take 1 capsule (60 mg total) by mouth daily. 90 capsule 1   fluticasone (FLONASE) 50 MCG/ACT nasal spray Place 2 sprays into both nostrils daily as needed for allergies. 16 g 3  hydrOXYzine (VISTARIL) 25 MG capsule Take 1 capsule (25 mg total) by mouth daily as needed. For panic attack or increased anxiety 30 capsule 0   MYRBETRIQ 50 MG TB24 tablet Take by mouth.     Omega-3 Fatty Acids (FISH OIL) 1000 MG CAPS Take by mouth.     omeprazole (PRILOSEC) 40 MG capsule TAKE 1 CAPSULE TWICE DAILY 180 capsule 1   prednisoLONE acetate (PRED FORTE) 1 % ophthalmic suspension Place 1 drop into both eyes as directed.     No current facility-administered medications for this visit.    Allergies:   Morphine, Metoclopramide hcl, Aricept [donepezil hcl], Doxycycline, Penicillins, Prednisone, and Sulfonamide derivatives    Social History:  The patient  reports that she has never smoked. She has never used smokeless tobacco. She reports current alcohol use. She reports that she does not use drugs.   Family History:  The patient's family history includes Anemia in her  mother; Breast cancer in her maternal aunt; Dementia in her maternal grandmother and mother; Diabetes in her brother and another family member; Hyperlipidemia in her sister; Hypertension in her mother; Kidney disease in her father; Lung cancer in her brother.    ROS:  Please see the history of present illness.   Otherwise, review of systems are positive for none.   All other systems are reviewed and negative.    PHYSICAL EXAM: VS:  BP 132/74 (BP Location: Left Arm, Patient Position: Sitting, Cuff Size: Normal)   Pulse 74   Ht '4\' 11"'$  (1.499 m)   Wt 120 lb 6.4 oz (54.6 kg)   SpO2 97%   BMI 24.32 kg/m  , BMI Body mass index is 24.32 kg/m. GENERAL:  Well appearing HEENT:  Pupils equal round and reactive, fundi not visualized, oral mucosa unremarkable NECK:  No jugular venous distention, waveform within normal limits, carotid upstroke brisk and symmetric, no bruits, no thyromegaly LYMPHATICS:  No cervical, inguinal adenopathy LUNGS:  Clear to auscultation bilaterally BACK:  No CVA tenderness CHEST:  Unremarkable HEART:  PMI not displaced or sustained,S1 and S2 within normal limits, no S3, no S4, no clicks, no rubs, no murmurs ABD:  Flat, positive bowel sounds normal in frequency in pitch, no bruits, no rebound, no guarding, no midline pulsatile mass, no hepatomegaly, no splenomegaly EXT:  2 plus pulses throughout, no edema, no cyanosis no clubbing SKIN:  No rashes no nodules NEURO:  Cranial nerves II through XII grossly intact, motor grossly intact throughout PSYCH:  Cognitively intact, oriented to person place and time    EKG:  EKG is ordered today. The ekg ordered today demonstrates sinus rhythm, rate 74, axis within normal limits, intervals within normal limits, no acute ST-T wave changes.   Recent Labs: 07/25/2021: ALT 11; BUN 20; Creatinine, Ser 0.79; Hemoglobin 13.2; Platelets 158.0; Potassium 3.8; Sodium 139; TSH 1.30    Lipid Panel    Component Value Date/Time   CHOL 228  (H) 07/25/2021 1446   TRIG 65.0 07/25/2021 1446   HDL 78.10 07/25/2021 1446   CHOLHDL 3 07/25/2021 1446   VLDL 13.0 07/25/2021 1446   LDLCALC 137 (H) 07/25/2021 1446   LDLDIRECT 110.7 03/07/2013 1027      Wt Readings from Last 3 Encounters:  01/03/22 120 lb 6.4 oz (54.6 kg)  12/06/21 121 lb (54.9 kg)  11/14/21 118 lb 12.8 oz (53.9 kg)      Other studies Reviewed: Additional studies/ records that were reviewed today include: Previous echocardiogram. Review of the above records demonstrates:  Please see elsewhere in the note.     ASSESSMENT AND PLAN:  Palpitations:   Her exam is unremarkable.  Echo was unremarkable years ago.  I am going to start with a 4-week monitor which she wants to put on after she has some further management of her ongoing eye issues.  She will let us know when she wants to apply that.  I will see her back afterwards.  I do note that blood work was normal including TSH and electrolytes.  I suspect she is having PACs or PVCs but need to rule out atrial fibrillation.  Hypertension blood pressure was upper limits of acceptable.  I am going to have her keep a blood pressure diary.   Current medicines are reviewed at length with the patient today.  The patient does not have concerns regarding medicines.  The following changes have been made:  no change  Labs/ tests ordered today include:   Orders Placed This Encounter  Procedures   EKG 12-Lead     Disposition:   FU with me after the monitor   Signed, Minus Breeding, MD  01/03/2022 12:05 PM    [place]

## 2022-01-03 ENCOUNTER — Telehealth: Payer: Self-pay

## 2022-01-03 ENCOUNTER — Ambulatory Visit: Payer: Medicare HMO | Attending: Cardiology | Admitting: Cardiology

## 2022-01-03 ENCOUNTER — Encounter: Payer: Self-pay | Admitting: Cardiology

## 2022-01-03 VITALS — BP 132/74 | HR 74 | Ht 59.0 in | Wt 120.4 lb

## 2022-01-03 DIAGNOSIS — R002 Palpitations: Secondary | ICD-10-CM

## 2022-01-03 NOTE — Telephone Encounter (Signed)
Called patient to advise of missed appointment with Dr.Hochrein- was unable to leave a message.

## 2022-01-03 NOTE — Patient Instructions (Addendum)
Medication Instructions:  The current medical regimen is effective;  continue present plan and medications.  *If you need a refill on your cardiac medications before your next appointment, please call your pharmacy*   Testing/Procedures: Your physician has recommended that you wear an event monitor (4 weeks). Event monitors are medical devices that record the heart's electrical activity. Doctors most often [place] these monitors to diagnose arrhythmias. Arrhythmias are problems with the speed or rhythm of the heartbeat. The monitor is a small, portable device. You can wear one while you do your normal daily activities. This is usually used to diagnose what is causing palpitations/syncope (passing out).   Follow-Up: At [facility], you and your health needs are our priority.  As part of our continuing mission to provide you with exceptional heart care, we have created designated Provider Care Teams.  These Care Teams include your primary Cardiologist (physician) and Advanced Practice Providers (APPs -  Physician Assistants and Nurse Practitioners) who all work together to provide you with the care you need, when you need it.  We recommend signing up for the patient portal called "MyChart".  Sign up information is provided on this After Visit Summary.  MyChart is used to connect with patients for Virtual Visits (Telemedicine).  Patients are able to view lab/test results, encounter notes, upcoming appointments, etc.  Non-urgent messages can be sent to your provider as well.   To learn more about what you can do with MyChart, go to [email or web address].    Your next appointment:   6 weeks after the monitor (when you decide to complete this)   The format for your next appointment:   In Person  Provider:   Minus Breeding, MD     Please let us know when you would like to complete the 4 week monitor. We will order it when you are ready.

## 2022-01-10 ENCOUNTER — Telehealth: Payer: Self-pay

## 2022-01-10 NOTE — Telephone Encounter (Signed)
Patient called asking about her bone density results she had done at [facility] mammography on 12/03/21 and she never received a call about the results. She would like to know what her results. Please advise.

## 2022-01-10 NOTE — Telephone Encounter (Signed)
Please refer to 12/20/2021 telephone note. "DEXA scan indicates mild osteopenia (noted in radius) other extremities normal. Recommend starting Caltrate (calcium/vitamin D) supplement daily if not already on one. Weight bearing exercise like light walking also beneficial for bone health."

## 2022-01-13 NOTE — Telephone Encounter (Signed)
Patient never received call on 12/20/21 about DEXA scan results and states she have been reaching out for a weeks now about her result. DEXA scan results was given to patient and she verbalized understanding.

## 2022-01-28 ENCOUNTER — Encounter: Payer: Self-pay | Admitting: Neurology

## 2022-01-28 ENCOUNTER — Ambulatory Visit: Payer: Medicare HMO | Admitting: Neurology

## 2022-01-28 ENCOUNTER — Telehealth: Payer: Self-pay | Admitting: Neurology

## 2022-01-28 VITALS — BP 151/80 | HR 84 | Ht 59.0 in | Wt 120.5 lb

## 2022-01-28 DIAGNOSIS — M4692 Unspecified inflammatory spondylopathy, cervical region: Secondary | ICD-10-CM | POA: Diagnosis not present

## 2022-01-28 DIAGNOSIS — R202 Paresthesia of skin: Secondary | ICD-10-CM

## 2022-01-28 NOTE — Telephone Encounter (Signed)
MR cervical spine wo contrast sent to GI for scheduling.  [employer] MCR Josem Kaufmann: [phone] exp. 01/28/22-02/27/22

## 2022-01-28 NOTE — Progress Notes (Signed)
Chief Complaint  Patient presents with   Follow-up    Rm 14. Accompanied by daughter over the phone. C/o bilateral numbness in feet and hands. Has vision loss in right eye from cataract surgery.      ASSESSMENT AND PLAN  Paige Pena is a 81 y.o. female   Cervical spondylosis Gait abnormality  Hyperflexia on exam, CT cervical in Feb 2023 showed significant degenerative changes.  MRI cervical for evaluation of possible cervical spondylotic myelopathy  Will consider PT refer after MRI cervical    Return To Clinic With NP In 3 Months   DIAGNOSTIC DATA (LABS, IMAGING, TESTING) - I reviewed patient records, labs, notes, testing and imaging myself where available.   MEDICAL HISTORY:  Paige Pena is a 81 year old female, seen in request by [facility] primary care nurse practitioner Cleophas Dunker, Amy E, for evaluation of gait abnormality, numbness at both hands and feet, initial evaluation January 28, 2022    I reviewed and summarized the referring note.PMHX HLD GERD   Since beginning of 2023 she noticed numbness tingling in bilateral feet and hands, gradually getting worse, also noticed mild unsteady gait  This happened following she fell off the ladder earlier, she was stepping up on a ladder of 5 of 6 feet, wiping her mirror,  missing steps going down, fell backwards,  She denies bowel or bladder incontinence,  Reviewed CT chest in July 2023 for evaluation of lung nodule, there is evidence of multilevel cervical degenerative changes    PHYSICAL EXAM:   Vitals:   01/28/22 1143  BP: (!) 151/80  Pulse: 84  Weight: 120 lb 8 oz (54.7 kg)  Height: '4\' 11"'$  (1.499 m)   Body mass index is 24.34 kg/m.  PHYSICAL EXAMNIATION:  Gen: NAD, conversant, well nourised, well groomed                     Cardiovascular: Regular rate rhythm, no peripheral edema, warm, nontender. Eyes: Conjunctivae clear without exudates or hemorrhage Neck: Supple, no carotid  bruits. Pulmonary: Clear to auscultation bilaterally   NEUROLOGICAL EXAM:  MENTAL STATUS: Speech/cognition: Awake, alert, oriented to history taking and casual conversation CRANIAL NERVES: CN II: Visual fields are full to confrontation. Left pupil is round equal and briskly reactive to light. Right pupil post surgical changes, opaque, OD 20/400 CN III, IV, VI: extraocular movement are normal. No ptosis. CN V: Facial sensation is intact to light touch CN VII: Face is symmetric with normal eye closure  CN VIII: Hearing is normal to causal conversation. CN IX, X: Phonation is normal. CN XI: Head turning and shoulder shrug are intact  MOTOR: There is no pronator drift of out-stretched arms. Muscle bulk and tone are normal. Muscle strength is normal.  REFLEXES: Reflexes are 3  and symmetric at the biceps, triceps, knees, and ankles. Plantar responses are extensor bilaterally  SENSORY: Intact to light touch, pinprick and vibratory sensation are intact in fingers and toes.  COORDINATION: There is no trunk or limb dysmetria noted.  GAIT/STANCE:Can getup from seated position arm crossed, mild stiff, still stable.  REVIEW OF SYSTEMS:  Full 14 system review of systems performed and notable only for as above All other review of systems were negative.   ALLERGIES: Allergies  Allergen Reactions   Morphine Other (See Comments)    REACTION: lethargy/malaise   Metoclopramide Hcl Anxiety and Other (See Comments)    REACTION: anxious and out of her mind   Aricept [Donepezil Hcl]  nausea   Doxycycline Diarrhea   Penicillins Itching and Rash    Has patient had a PCN reaction causing immediate rash, facial/tongue/throat swelling, SOB or lightheadedness with hypotension: No Has patient had a PCN reaction causing severe rash involving mucus membranes or skin necrosis: No Has patient had a PCN reaction that required hospitalization No Has patient had a PCN reaction occurring within the  last 10 years: Yes If all of the above answers are "NO", then may proceed with Cephalosporin use.     Prednisone Itching and Rash   Sulfonamide Derivatives Itching and Rash    HOME MEDICATIONS: Current Outpatient Medications  Medication Sig Dispense Refill   aspirin 325 MG EC tablet Take 325 mg by mouth daily.     atorvastatin (LIPITOR) 20 MG tablet Take 1 tablet (20 mg total) by mouth every Monday, Wednesday, and Friday. 30 tablet 3   Biotin 1000 MCG tablet Take 1,000 mcg by mouth 3 (three) times daily.     Cholecalciferol (VITAMIN D) 50 MCG (2000 UT) CAPS Take by mouth.     COLLAGEN-VITAMIN C PO Take by mouth.     cyanocobalamin (VITAMIN B12) 1000 MCG tablet Take 1 tablet (1,000 mcg total) by mouth daily. 30 tablet 3   DULoxetine (CYMBALTA) 60 MG capsule Take 1 capsule (60 mg total) by mouth daily. 90 capsule 1   fluticasone (FLONASE) 50 MCG/ACT nasal spray Place 2 sprays into both nostrils daily as needed for allergies. 16 g 3   hydrOXYzine (VISTARIL) 25 MG capsule Take 1 capsule (25 mg total) by mouth daily as needed. For panic attack or increased anxiety 30 capsule 0   MYRBETRIQ 50 MG TB24 tablet Take by mouth.     Omega-3 Fatty Acids (FISH OIL) 1000 MG CAPS Take by mouth.     omeprazole (PRILOSEC) 40 MG capsule TAKE 1 CAPSULE TWICE DAILY 180 capsule 1   prednisoLONE acetate (PRED FORTE) 1 % ophthalmic suspension Place 1 drop into both eyes as directed.     No current facility-administered medications for this visit.    PAST MEDICAL HISTORY: Past Medical History:  Diagnosis Date   Acute cystitis    Anxiety    Aphasia    transsient aphasia Jan 2012   Asthmatic bronchitis    Cerebrovascular disease    Diverticulosis of colon    DJD (degenerative joint disease)    Dysphonia    Esophageal stricture    Fibromyalgia    Gastroparesis    GERD (gastroesophageal reflux disease)    Hematuria, microscopic    Hemorrhoids    Hypercholesteremia    IBS (irritable bowel syndrome)     Memory deficits 02/15/2013   RLS (restless legs syndrome)    TIA (transient ischemic attack)    Urinary incontinence    Vitamin D deficiency     PAST SURGICAL HISTORY: Past Surgical History:  Procedure Laterality Date   ABDOMINAL HYSTERECTOMY  02/24/1993   cataract surgery Right 12/19/2021   corneal transplant for keratonconus     EYE SURGERY  02/25/2011   left foot fracture with ORIF  07/25/2004   Dr. Sharol Given   right bunion surgery      FAMILY HISTORY: Family History  Problem Relation Age of Onset   Hypertension Mother    Anemia Mother    Dementia Mother    Kidney disease Father    Hyperlipidemia Sister    Diabetes Brother    Lung cancer Brother    Dementia Maternal Grandmother    Diabetes Other  aunt,brother,uncle   Breast cancer Maternal Aunt     SOCIAL HISTORY: Social History   Socioeconomic History   Marital status: Married    Spouse name: Mallie Mussel   Number of children: 3   Years of education: college-2   Highest education level: Not on file  Occupational History    Employer: RETIRED  Tobacco Use   Smoking status: Never   Smokeless tobacco: Never  Vaping Use   Vaping Use: Never used  Substance and Sexual Activity   Alcohol use: Yes    Comment: occ   Drug use: Never   Sexual activity: Not on file  Other Topics Concern   Not on file  Social History Narrative   Lennie Hummer   [place], daughter   Keyonda Bickle info release form to share her medical records with her children   Lives at home w/ her husband   Right-handed   Caffeine: tea on occasion   Social Determinants of Health   Financial Resource Strain: Low Risk  (07/11/2021)   Overall Financial Resource Strain (CARDIA)    Difficulty of Paying Living Expenses: Not hard at all  Food Insecurity: No Food Insecurity (07/11/2021)   Hunger Vital Sign    Worried About Running Out of Food in the Last Year: Never true    [place] in the Last Year: Never  true  Transportation Needs: No Transportation Needs (07/11/2021)   PRAPARE - Hydrologist (Medical): No    Lack of Transportation (Non-Medical): No  Physical Activity: Inactive (07/11/2021)   Exercise Vital Sign    Days of Exercise per Week: 0 days    Minutes of Exercise per Session: 0 min  Stress: Stress Concern Present (07/11/2021)   [place]    Feeling of Stress : Rather much  Social Connections: Moderately Integrated (07/11/2021)   Social Connection and Isolation Panel [NHANES]    Frequency of Communication with Friends and Family: More than three times a week    Frequency of Social Gatherings with Friends and Family: Once a week    Attends Religious Services: More than 4 times per year    Active Member of [employer] or Organizations: No    Attends Archivist Meetings: Never    Marital Status: Married  Human resources officer Violence: Not At Risk (07/11/2021)   Humiliation, Afraid, Rape, and Kick questionnaire    Fear of Current or Ex-Partner: No    Emotionally Abused: No    Physically Abused: No    Sexually Abused: No      Marcial Pacas, M.D. Ph.D.  [facility] [address] [place], [place] [address] Ph: [phone] Fax: [phone]  CC:  Yvonna Alanis, NP [address],  [place] [address]  Yvonna Alanis, NP

## 2022-03-03 DIAGNOSIS — Z961 Presence of intraocular lens: Secondary | ICD-10-CM | POA: Diagnosis not present

## 2022-03-03 DIAGNOSIS — Z9841 Cataract extraction status, right eye: Secondary | ICD-10-CM | POA: Diagnosis not present

## 2022-03-03 DIAGNOSIS — H02423 Myogenic ptosis of bilateral eyelids: Secondary | ICD-10-CM | POA: Diagnosis not present

## 2022-03-03 DIAGNOSIS — H2512 Age-related nuclear cataract, left eye: Secondary | ICD-10-CM | POA: Diagnosis not present

## 2022-03-03 DIAGNOSIS — Z947 Corneal transplant status: Secondary | ICD-10-CM | POA: Diagnosis not present

## 2022-03-07 ENCOUNTER — Other Ambulatory Visit: Payer: Medicare HMO

## 2022-03-12 DIAGNOSIS — R269 Unspecified abnormalities of gait and mobility: Secondary | ICD-10-CM

## 2022-03-18 ENCOUNTER — Other Ambulatory Visit: Payer: Medicare HMO

## 2022-03-19 DIAGNOSIS — R928 Other abnormal and inconclusive findings on diagnostic imaging of breast: Secondary | ICD-10-CM | POA: Diagnosis not present

## 2022-03-19 DIAGNOSIS — N6321 Unspecified lump in the left breast, upper outer quadrant: Secondary | ICD-10-CM | POA: Diagnosis not present

## 2022-03-19 LAB — HM MAMMOGRAPHY

## 2022-03-26 ENCOUNTER — Ambulatory Visit
Admission: RE | Admit: 2022-03-26 | Discharge: 2022-03-26 | Disposition: A | Discharge status: Home / Self Care | Payer: Medicare HMO | Source: Ambulatory Visit | Attending: Neurology | Admitting: Neurology

## 2022-03-26 DIAGNOSIS — R202 Paresthesia of skin: Secondary | ICD-10-CM

## 2022-03-26 DIAGNOSIS — M4692 Unspecified inflammatory spondylopathy, cervical region: Secondary | ICD-10-CM | POA: Diagnosis not present

## 2022-03-27 NOTE — Telephone Encounter (Signed)
Please call patient, MRI of the brain showed multilevel degenerative changes, mild canal stenosis at C6-7 with variable degree of foraminal narrowing  This has mild progression compared to previous study in 2017  I have ordered EMG nerve conduction study, physical therapy for her, may cancel her appointment with Jinny Blossom as previously scheduled  Advised her to bring family members for [place] to go over results with her  MRI cervical spine (without) demonstrating: - At C3-4 disc bulging and facet hypertrophy with mild spinal stenosis and severe bilateral foraminal stenosis. - At C6-7 disc bulging with mild spinal stenosis and mild bilateral foraminal stenosis. - At C4-5, C5-6 uncovertebral joint hypertrophy with moderate bilateral foraminal stenosis. -Mild progression of degenerative changes since 2017.     Orders Placed This Encounter  Procedures   Ambulatory referral to Physical Therapy   NCV with EMG(electromyography)

## 2022-03-28 ENCOUNTER — Telehealth: Payer: Self-pay

## 2022-03-28 NOTE — Telephone Encounter (Addendum)
From Dr. Krista Blue   Please call patient, MRI of the brain showed multilevel degenerative changes, mild canal stenosis at C6-7 with variable degree of foraminal narrowing   This has mild progression compared to previous study in 2017   I have ordered EMG nerve conduction study, physical therapy for her, may cancel her appointment with Jinny Blossom as previously scheduled   Advised her to bring family members for [place] to go over results with her  MRI cervical spine (without) demonstrating: - At C3-4 disc bulging and facet hypertrophy with mild spinal stenosis and severe bilateral foraminal stenosis. - At C6-7 disc bulging with mild spinal stenosis and mild bilateral foraminal stenosis. - At C4-5, C5-6 uncovertebral joint hypertrophy with moderate bilateral foraminal stenosis. -Mild progression of degenerative changes since 2017.        Orders Placed This Encounter  Procedures   Ambulatory referral to Physical Therapy   NCV with EMG(electromyography)

## 2022-03-31 ENCOUNTER — Telehealth: Payer: Self-pay | Admitting: Cardiology

## 2022-03-31 NOTE — Telephone Encounter (Signed)
Patient calling in regards to getting the heart monitor. Please advise

## 2022-03-31 NOTE — Telephone Encounter (Signed)
Called patient, advised that when she was in to see Dr.Hochrein- she advised she was ready for the monitor- advised I would check with him if he was still on this page for the 4 week monitor and we could get it ordered.   Thanks!    November OV:  Palpitations:   Her exam is unremarkable.  Echo was unremarkable years ago.  I am going to start with a 4-week monitor which she wants to put on after she has some further management of her ongoing eye issues.  She will let us know when she wants to apply that.  I will see her back afterwards.  I do note that blood work was normal including TSH and electrolytes.  I suspect she is having PACs or PVCs but need to rule out atrial fibrillation.

## 2022-04-02 ENCOUNTER — Other Ambulatory Visit: Payer: Self-pay

## 2022-04-02 DIAGNOSIS — R002 Palpitations: Secondary | ICD-10-CM

## 2022-04-02 NOTE — Telephone Encounter (Signed)
Called patient, ordered 4 week monitor- advised that it would be coming.   Patient verbalized understanding.

## 2022-04-03 ENCOUNTER — Ambulatory Visit: Payer: Medicare HMO

## 2022-04-03 ENCOUNTER — Other Ambulatory Visit: Payer: Self-pay

## 2022-04-03 ENCOUNTER — Ambulatory Visit: Payer: Medicare HMO | Attending: Neurology | Admitting: Physical Therapy

## 2022-04-03 DIAGNOSIS — M6281 Muscle weakness (generalized): Secondary | ICD-10-CM

## 2022-04-03 DIAGNOSIS — R269 Unspecified abnormalities of gait and mobility: Secondary | ICD-10-CM

## 2022-04-03 NOTE — Therapy (Signed)
OUTPATIENT PHYSICAL THERAPY NEURO EVALUATION   Patient Name: Paige Pena MRN: [id number] DOB:[birth year], 82 y.o., female Today's Date: 04/03/2022   PCP: Windell Moulding, NP REFERRING PROVIDER: Dr. Marcial Pacas  END OF SESSION:  PT End of Session - 04/03/22 1414     Visit Number 1    Number of Visits 9    Date for PT Re-Evaluation 05/15/22    PT Start Time 5072    PT Stop Time 1455    PT Time Calculation (min) 40 min    Activity Tolerance Patient tolerated treatment well    Behavior During Therapy [facility] for tasks assessed/performed             Past Medical History:  Diagnosis Date   Acute cystitis    Anxiety    Aphasia    transsient aphasia Jan 2012   Asthmatic bronchitis    Cerebrovascular disease    Diverticulosis of colon    DJD (degenerative joint disease)    Dysphonia    Esophageal stricture    Fibromyalgia    Gastroparesis    GERD (gastroesophageal reflux disease)    Hematuria, microscopic    Hemorrhoids    Hypercholesteremia    IBS (irritable bowel syndrome)    Memory deficits 02/15/2013   RLS (restless legs syndrome)    TIA (transient ischemic attack)    Urinary incontinence    Vitamin D deficiency    Past Surgical History:  Procedure Laterality Date   ABDOMINAL HYSTERECTOMY  02/24/1993   cataract surgery Right 12/19/2021   corneal transplant for keratonconus     EYE SURGERY  02/25/2011   left foot fracture with ORIF  07/25/2004   Dr. Sharol Given   right bunion surgery     Patient Active Problem List   Diagnosis Date Noted   Pulmonary nodule 09/03/2021   Osteopenia 07/25/2021   Throat irritation 11/28/2019   HLD (hyperlipidemia)    Overactive bladder 05/08/2015   Aphasia    Cervical spondylitis ([place])    Paresthesias 03/21/2015   TIA (transient ischemic attack) 03/21/2015   Physical exam 10/05/2014   Abdominal pain 05/17/2014   Allergic rhinitis 05/17/2014   Fatigue 04/04/2014   Anxiety and depression 07/25/2013   Weight loss 07/25/2013   Memory  deficits 02/15/2013   Lumbar spondylosis 09/21/2012   Tachycardia 09/25/2011   Palpitations 09/16/2010   Dysphonia 09/05/2010   Slowing of urinary stream 09/25/2009   Cerebrovascular disease 04/03/2008   DYSPHONIA 04/03/2008   Diverticulosis of large intestine 04/02/2008   Irritable bowel syndrome 04/02/2008   DEGENERATIVE JOINT DISEASE 04/02/2008   HEMATURIA, MICROSCOPIC, HX OF 04/02/2008   Vitamin D deficiency 03/28/2008   Fibromyalgia 03/28/2008   URINARY INCONTINENCE 03/28/2008   GERD 08/02/2007   Gastroparesis 08/02/2007   HEMORRHOIDS 07/30/2007   ESOPHAGEAL STRICTURE 07/30/2007   Anxiety state 07/30/2007    ONSET DATE: 03/27/22  REFERRING DIAG: R26.9 (ICD-10-CM) - Abnormal gait   THERAPY DIAG:  Abnormal gait  Muscle weakness (generalized)  Rationale for Evaluation and Treatment: Rehabilitation  SUBJECTIVE:  SUBJECTIVE STATEMENT: PT reports she had PT at [facility] last week. She is seeing a new neurologist at [facility]. Pt reports when she is walking, she veers to left. Pt reports of intermittent numbness in her left toes. Pt reports numbness in left foot is more prominent when she is lying down or sitting. She also gets cramping in her Left calf. Pt denies any back pain. Pt accompanied by: self  PERTINENT HISTORY: fibromyalgia, anxiety, TIA x 2 (last one was about 5 years ago); pt reports of having 4 screws in left foot from fractured foot years ago from fall.  PAIN:  Are you having pain? No  PRECAUTIONS: None  WEIGHT BEARING RESTRICTIONS: No  FALLS: Has patient fallen in last 6 months? No  LIVING ENVIRONMENT: Lives with: lives with their spouse Lives in: House/apartment Stairs: Yes: External: 3 steps; can reach both Has following equipment at home: None  PLOF:  Independent  PATIENT GOALS: improve balance with walking  OBJECTIVE:   DIAGNOSTIC FINDINGS: 03/26/22 Cervical MRI MRI cervical spine (without) demonstrating: - At C3-4 disc bulging and facet hypertrophy with mild spinal stenosis and severe bilateral foraminal stenosis. - At C6-7 disc bulging with mild spinal stenosis and mild bilateral foraminal stenosis. - At C4-5, C5-6 uncovertebral joint hypertrophy with moderate bilateral foraminal stenosis. -Mild progression of degenerative changes since 2017.  COGNITION: Overall cognitive status: Within functional limits for tasks assessed   LOWER EXTREMITY MMT:    MMT Right Eval Left Eval  Hip flexion 5 4  Hip extension    Hip abduction 5 5  Hip adduction 5 5  Hip internal rotation    Hip external rotation    Knee flexion 5 5  Knee extension 5 5  Ankle dorsiflexion 5 4  Ankle plantarflexion 5 4  Ankle inversion    Ankle eversion    (Blank rows = not tested)  FUNCTIONAL TESTS:  5 times sit to stand: 13.6 sec without UE support FGA: 27/30 MCTSIB: Condition 1: Avg of 3 trials: 30 sec, Condition 2: Avg of 3 trials: 30 sec, Condition 3: Avg of 3 trials: 30 sec, Condition 4: Avg of 3 trials: 30 sec, and Total Score: 120/120   TODAY'S TREATMENT:                                                                                                                              DATE: n/a    PATIENT EDUCATION: Education details: Pt educated on evaluation findings, discussed goals Person educated: Patient Education method: Explanation Education comprehension: verbalized understanding  HOME EXERCISE PROGRAM: TBD  GOALS: Goals reviewed with patient? Yes  SHORT TERM GOALS: Target date: 05/01/2022   Patient will demo 30/30 on FGA to improve dynamic balance with functional mobility Baseline: 27/30 (04/03/22) Goal status: INITIAL  2.  Pt will demo <12 seconds with 5x sit to stand to improve functional strength with transfers. Baseline: 13.6  sec (no UE support)(04/03/22) Goal status: INITIAL  LONG TERM GOALS: Target date: 05/15/2022    Pt will be I and compliant with HEP to self manage her symptoms. Baseline: HEP to issued in future sessions. Goal status: INITIAL    ASSESSMENT:  CLINICAL IMPRESSION: Patient is a 82 y.o. female who was seen today for physical therapy evaluation and treatment for gait abnormality. Based on functional testing for balance and mobility, patient is not at risk for fall. Patient demonstrates decreased proprioception with functional mobility without visual feedback (based on FGA). Patient will benefit from short bout of therapy to establish updated HEP to continue to improve her mild balance deficit and improve overall functional strength..   OBJECTIVE IMPAIRMENTS: Abnormal gait, decreased balance, decreased endurance, difficulty walking, decreased strength, increased muscle spasms, postural dysfunction, and pain.   ACTIVITY LIMITATIONS: lifting, sitting, squatting, and transfers  PARTICIPATION LIMITATIONS: cleaning, shopping, and community activity  PERSONAL FACTORS: Age, Past/current experiences, and Time since onset of injury/illness/exacerbation are also affecting patient's functional outcome.   REHAB POTENTIAL: Excellent  CLINICAL DECISION MAKING: Stable/uncomplicated  EVALUATION COMPLEXITY: Low  PLAN:  PT FREQUENCY: 2x/week  PT DURATION: 6 weeks (8 sessions plus eval)  PLANNED INTERVENTIONS: Therapeutic exercises, Therapeutic activity, Neuromuscular re-education, Balance training, Gait training, Patient/Family education, Self Care, Joint mobilization, Joint manipulation, Stair training, Orthotic/Fit training, Spinal mobilization, Cryotherapy, Moist heat, Manual therapy, and Re-evaluation  PLAN FOR NEXT SESSION: Continue to work on dynamic gait with eyes closed, on non-compliant surfaces, static balance with eyes closed and non compliant surface (work on managing excessive forward  sway with eyes closed); functional strength   Kerrie Pleasure, PT 04/03/2022, 4:39 PM

## 2022-04-07 DIAGNOSIS — N6002 Solitary cyst of left breast: Secondary | ICD-10-CM | POA: Diagnosis not present

## 2022-04-09 ENCOUNTER — Ambulatory Visit: Payer: Medicare HMO | Attending: Cardiology

## 2022-04-09 DIAGNOSIS — R002 Palpitations: Secondary | ICD-10-CM

## 2022-04-10 ENCOUNTER — Ambulatory Visit: Payer: Medicare HMO

## 2022-04-10 DIAGNOSIS — M6281 Muscle weakness (generalized): Secondary | ICD-10-CM | POA: Diagnosis not present

## 2022-04-10 DIAGNOSIS — R269 Unspecified abnormalities of gait and mobility: Secondary | ICD-10-CM | POA: Diagnosis not present

## 2022-04-10 NOTE — Therapy (Signed)
OUTPATIENT PHYSICAL THERAPY NEURO TREATMENT   Patient Name: Paige Pena MRN: [id number] DOB:[birth year], 82 y.o., female Today's Date: 04/10/2022   PCP: Windell Moulding, NP REFERRING PROVIDER: Dr. Marcial Pacas  END OF SESSION:    Past Medical History:  Diagnosis Date   Acute cystitis    Anxiety    Aphasia    transsient aphasia Jan 2012   Asthmatic bronchitis    Cerebrovascular disease    Diverticulosis of colon    DJD (degenerative joint disease)    Dysphonia    Esophageal stricture    Fibromyalgia    Gastroparesis    GERD (gastroesophageal reflux disease)    Hematuria, microscopic    Hemorrhoids    Hypercholesteremia    IBS (irritable bowel syndrome)    Memory deficits 02/15/2013   RLS (restless legs syndrome)    TIA (transient ischemic attack)    Urinary incontinence    Vitamin D deficiency    Past Surgical History:  Procedure Laterality Date   ABDOMINAL HYSTERECTOMY  02/24/1993   cataract surgery Right 12/19/2021   corneal transplant for keratonconus     EYE SURGERY  02/25/2011   left foot fracture with ORIF  07/25/2004   Dr. Sharol Given   right bunion surgery     Patient Active Problem List   Diagnosis Date Noted   Pulmonary nodule 09/03/2021   Osteopenia 07/25/2021   Throat irritation 11/28/2019   HLD (hyperlipidemia)    Overactive bladder 05/08/2015   Aphasia    Cervical spondylitis ([place])    Paresthesias 03/21/2015   TIA (transient ischemic attack) 03/21/2015   Physical exam 10/05/2014   Abdominal pain 05/17/2014   Allergic rhinitis 05/17/2014   Fatigue 04/04/2014   Anxiety and depression 07/25/2013   Weight loss 07/25/2013   Memory deficits 02/15/2013   Lumbar spondylosis 09/21/2012   Tachycardia 09/25/2011   Palpitations 09/16/2010   Dysphonia 09/05/2010   Slowing of urinary stream 09/25/2009   Cerebrovascular disease 04/03/2008   DYSPHONIA 04/03/2008   Diverticulosis of large intestine 04/02/2008   Irritable bowel syndrome 04/02/2008    DEGENERATIVE JOINT DISEASE 04/02/2008   HEMATURIA, MICROSCOPIC, HX OF 04/02/2008   Vitamin D deficiency 03/28/2008   Fibromyalgia 03/28/2008   URINARY INCONTINENCE 03/28/2008   GERD 08/02/2007   Gastroparesis 08/02/2007   HEMORRHOIDS 07/30/2007   ESOPHAGEAL STRICTURE 07/30/2007   Anxiety state 07/30/2007    ONSET DATE: 03/27/22  REFERRING DIAG: R26.9 (ICD-10-CM) - Abnormal gait   THERAPY DIAG:  No diagnosis found.  Rationale for Evaluation and Treatment: Rehabilitation  SUBJECTIVE:  SUBJECTIVE STATEMENT: Not a whole lot going on, just getting old. I still have the problem with the left leg. It is weaker and I feel like I walk and go to the left.  Pt accompanied by: self  PERTINENT HISTORY: fibromyalgia, anxiety, TIA x 2 (last one was about 5 years ago); pt reports of having 4 screws in left foot from fractured foot years ago from fall.  PAIN:  Are you having pain? No  PRECAUTIONS: None  WEIGHT BEARING RESTRICTIONS: No  FALLS: Has patient fallen in last 6 months? No  LIVING ENVIRONMENT: Lives with: lives with their spouse Lives in: House/apartment Stairs: Yes: External: 3 steps; can reach both Has following equipment at home: None  PLOF: Independent  PATIENT GOALS: improve balance with walking  OBJECTIVE:   DIAGNOSTIC FINDINGS: 03/26/22 Cervical MRI MRI cervical spine (without) demonstrating: - At C3-4 disc bulging and facet hypertrophy with mild spinal stenosis and severe bilateral foraminal stenosis. - At C6-7 disc bulging with mild spinal stenosis and mild bilateral foraminal stenosis. - At C4-5, C5-6 uncovertebral joint hypertrophy with moderate bilateral foraminal stenosis. -Mild progression of degenerative changes since 2017.  COGNITION: Overall cognitive status: Within  functional limits for tasks assessed   LOWER EXTREMITY MMT:    MMT Right Eval Left Eval  Hip flexion 5 4  Hip extension    Hip abduction 5 5  Hip adduction 5 5  Hip internal rotation    Hip external rotation    Knee flexion 5 5  Knee extension 5 5  Ankle dorsiflexion 5 4  Ankle plantarflexion 5 4  Ankle inversion    Ankle eversion    (Blank rows = not tested)  FUNCTIONAL TESTS:  5 times sit to stand: 13.6 sec without UE support FGA: 27/30 MCTSIB: Condition 1: Avg of 3 trials: 30 sec, Condition 2: Avg of 3 trials: 30 sec, Condition 3: Avg of 3 trials: 30 sec, Condition 4: Avg of 3 trials: 30 sec, and Total Score: 120/120   TODAY'S TREATMENT:                                                                                                                              DATE: 04/10/22 NuStep L5 x88mns Walking on beam Standing on airex feet together, tandem then with eyes closed 3-5s secs at best with eyes closed  Leg ext 10# 2x10 HS curls 20# 2x10  Leg press 20# 2x10 STS on airex 2x10   PATIENT EDUCATION: Education details: Pt educated on evaluation findings, discussed goals Person educated: Patient Education method: Explanation Education comprehension: verbalized understanding  HOME EXERCISE PROGRAM: TBD  GOALS: Goals reviewed with patient? Yes  SHORT TERM GOALS: Target date: 05/01/2022   Patient will demo 30/30 on FGA to improve dynamic balance with functional mobility Baseline: 27/30 (04/03/22) Goal status: INITIAL  2.  Pt will demo <12 seconds with 5x sit to stand to improve functional strength with transfers. Baseline: 13.6 sec (no  UE support)(04/03/22) Goal status: INITIAL   LONG TERM GOALS: Target date: 05/15/2022    Pt will be I and compliant with HEP to self manage her symptoms. Baseline: HEP to issued in future sessions. Goal status: INITIAL    ASSESSMENT:  CLINICAL IMPRESSION: Patient returns doing well overall, does state she feels a bit  lightheaded today. She feels as if her legs are weak. Has some trouble with balance beam walking and the most difficulty with eye closed. Only able to hold a few seconds.   OBJECTIVE IMPAIRMENTS: Abnormal gait, decreased balance, decreased endurance, difficulty walking, decreased strength, increased muscle spasms, postural dysfunction, and pain.   ACTIVITY LIMITATIONS: lifting, sitting, squatting, and transfers  PARTICIPATION LIMITATIONS: cleaning, shopping, and community activity  PERSONAL FACTORS: Age, Past/current experiences, and Time since onset of injury/illness/exacerbation are also affecting patient's functional outcome.   REHAB POTENTIAL: Excellent  CLINICAL DECISION MAKING: Stable/uncomplicated  EVALUATION COMPLEXITY: Low  PLAN:  PT FREQUENCY: 2x/week  PT DURATION: 6 weeks (8 sessions plus eval)  PLANNED INTERVENTIONS: Therapeutic exercises, Therapeutic activity, Neuromuscular re-education, Balance training, Gait training, Patient/Family education, Self Care, Joint mobilization, Joint manipulation, Stair training, Orthotic/Fit training, Spinal mobilization, Cryotherapy, Moist heat, Manual therapy, and Re-evaluation  PLAN FOR NEXT SESSION: Continue to work on dynamic gait with eyes closed, on non-compliant surfaces, static balance with eyes closed and non compliant surface (work on managing excessive forward sway with eyes closed); functional strength   [employer], PT 04/10/2022, 10:14 AM

## 2022-04-15 ENCOUNTER — Ambulatory Visit: Payer: Medicare HMO | Admitting: Physical Therapy

## 2022-04-15 NOTE — Therapy (Incomplete)
OUTPATIENT PHYSICAL THERAPY NEURO TREATMENT   Patient Name: Paige Pena MRN: [id number] DOB:27-Oct-1940, 82 y.o., female Today's Date: 04/10/2022   PCP: Windell Moulding, NP REFERRING PROVIDER: Dr. Marcial Pacas  END OF SESSION:    Past Medical History:  Diagnosis Date   Acute cystitis    Anxiety    Aphasia    transsient aphasia Jan 2012   Asthmatic bronchitis    Cerebrovascular disease    Diverticulosis of colon    DJD (degenerative joint disease)    Dysphonia    Esophageal stricture    Fibromyalgia    Gastroparesis    GERD (gastroesophageal reflux disease)    Hematuria, microscopic    Hemorrhoids    Hypercholesteremia    IBS (irritable bowel syndrome)    Memory deficits 02/15/2013   RLS (restless legs syndrome)    TIA (transient ischemic attack)    Urinary incontinence    Vitamin D deficiency    Past Surgical History:  Procedure Laterality Date   ABDOMINAL HYSTERECTOMY  02/24/1993   cataract surgery Right 12/19/2021   corneal transplant for keratonconus     EYE SURGERY  02/25/2011   left foot fracture with ORIF  07/25/2004   Dr. Sharol Given   right bunion surgery     Patient Active Problem List   Diagnosis Date Noted   Pulmonary nodule 09/03/2021   Osteopenia 07/25/2021   Throat irritation 11/28/2019   HLD (hyperlipidemia)    Overactive bladder 05/08/2015   Aphasia    Cervical spondylitis ([place])    Paresthesias 03/21/2015   TIA (transient ischemic attack) 03/21/2015   Physical exam 10/05/2014   Abdominal pain 05/17/2014   Allergic rhinitis 05/17/2014   Fatigue 04/04/2014   Anxiety and depression 07/25/2013   Weight loss 07/25/2013   Memory deficits 02/15/2013   Lumbar spondylosis 09/21/2012   Tachycardia 09/25/2011   Palpitations 09/16/2010   Dysphonia 09/05/2010   Slowing of urinary stream 09/25/2009   Cerebrovascular disease 04/03/2008   DYSPHONIA 04/03/2008   Diverticulosis of large intestine 04/02/2008   Irritable bowel syndrome 04/02/2008    DEGENERATIVE JOINT DISEASE 04/02/2008   HEMATURIA, MICROSCOPIC, HX OF 04/02/2008   Vitamin D deficiency 03/28/2008   Fibromyalgia 03/28/2008   URINARY INCONTINENCE 03/28/2008   GERD 08/02/2007   Gastroparesis 08/02/2007   HEMORRHOIDS 07/30/2007   ESOPHAGEAL STRICTURE 07/30/2007   Anxiety state 07/30/2007    ONSET DATE: 03/27/22  REFERRING DIAG: R26.9 (ICD-10-CM) - Abnormal gait   THERAPY DIAG:  No diagnosis found.  Rationale for Evaluation and Treatment: Rehabilitation  SUBJECTIVE:  SUBJECTIVE STATEMENT: Not a whole lot going on, just getting old. I still have the problem with the left leg. It is weaker and I feel like I walk and go to the left.  Pt accompanied by: self  PERTINENT HISTORY: fibromyalgia, anxiety, TIA x 2 (last one was about 5 years ago); pt reports of having 4 screws in left foot from fractured foot years ago from fall.  PAIN:  Are you having pain? No  PRECAUTIONS: None  WEIGHT BEARING RESTRICTIONS: No  FALLS: Has patient fallen in last 6 months? No  LIVING ENVIRONMENT: Lives with: lives with their spouse Lives in: House/apartment Stairs: Yes: External: 3 steps; can reach both Has following equipment at home: None  PLOF: Independent  PATIENT GOALS: improve balance with walking  OBJECTIVE:   DIAGNOSTIC FINDINGS: 03/26/22 Cervical MRI MRI cervical spine (without) demonstrating: - At C3-4 disc bulging and facet hypertrophy with mild spinal stenosis and severe bilateral foraminal stenosis. - At C6-7 disc bulging with mild spinal stenosis and mild bilateral foraminal stenosis. - At C4-5, C5-6 uncovertebral joint hypertrophy with moderate bilateral foraminal stenosis. -Mild progression of degenerative changes since 2017.  COGNITION: Overall cognitive status: Within  functional limits for tasks assessed   LOWER EXTREMITY MMT:    MMT Right Eval Left Eval  Hip flexion 5 4  Hip extension    Hip abduction 5 5  Hip adduction 5 5  Hip internal rotation    Hip external rotation    Knee flexion 5 5  Knee extension 5 5  Ankle dorsiflexion 5 4  Ankle plantarflexion 5 4  Ankle inversion    Ankle eversion    (Blank rows = not tested)  FUNCTIONAL TESTS:  5 times sit to stand: 13.6 sec without UE support FGA: 27/30 MCTSIB: Condition 1: Avg of 3 trials: 30 sec, Condition 2: Avg of 3 trials: 30 sec, Condition 3: Avg of 3 trials: 30 sec, Condition 4: Avg of 3 trials: 30 sec, and Total Score: 120/120   TODAY'S TREATMENT:                                                                                                                              DATE:  04/16/22 NuStep Walking catching ball, direction changes Resisted gait  Standing on airex hitting volleyball  04/10/22 NuStep L5 x64mns Walking on beam Standing on airex feet together, tandem then with eyes closed 3-5s secs at best with eyes closed  Leg ext 10# 2x10 HS curls 20# 2x10  Leg press 20# 2x10 STS on airex 2x10   PATIENT EDUCATION: Education details: Pt educated on evaluation findings, discussed goals Person educated: Patient Education method: Explanation Education comprehension: verbalized understanding  HOME EXERCISE PROGRAM: TBD  GOALS: Goals reviewed with patient? Yes  SHORT TERM GOALS: Target date: 05/01/2022   Patient will demo 30/30 on FGA to improve dynamic balance with functional mobility Baseline: 27/30 (04/03/22) Goal status: INITIAL  2.  Pt will demo <  12 seconds with 5x sit to stand to improve functional strength with transfers. Baseline: 13.6 sec (no UE support)(04/03/22) Goal status: INITIAL   LONG TERM GOALS: Target date: 05/15/2022    Pt will be I and compliant with HEP to self manage her symptoms. Baseline: HEP to issued in future sessions. Goal status:  INITIAL    ASSESSMENT:  CLINICAL IMPRESSION: Patient returns doing well overall, does state she feels a bit lightheaded today. She feels as if her legs are weak. Has some trouble with balance beam walking and the most difficulty with eye closed. Only able to hold a few seconds.   OBJECTIVE IMPAIRMENTS: Abnormal gait, decreased balance, decreased endurance, difficulty walking, decreased strength, increased muscle spasms, postural dysfunction, and pain.   ACTIVITY LIMITATIONS: lifting, sitting, squatting, and transfers  PARTICIPATION LIMITATIONS: cleaning, shopping, and community activity  PERSONAL FACTORS: Age, Past/current experiences, and Time since onset of injury/illness/exacerbation are also affecting patient's functional outcome.   REHAB POTENTIAL: Excellent  CLINICAL DECISION MAKING: Stable/uncomplicated  EVALUATION COMPLEXITY: Low  PLAN:  PT FREQUENCY: 2x/week  PT DURATION: 6 weeks (8 sessions plus eval)  PLANNED INTERVENTIONS: Therapeutic exercises, Therapeutic activity, Neuromuscular re-education, Balance training, Gait training, Patient/Family education, Self Care, Joint mobilization, Joint manipulation, Stair training, Orthotic/Fit training, Spinal mobilization, Cryotherapy, Moist heat, Manual therapy, and Re-evaluation  PLAN FOR NEXT SESSION: Continue to work on dynamic gait with eyes closed, on non-compliant surfaces, static balance with eyes closed and non compliant surface (work on managing excessive forward sway with eyes closed); functional strength   [employer], PT 04/10/2022, 10:14 AM

## 2022-04-16 ENCOUNTER — Ambulatory Visit: Payer: Medicare HMO

## 2022-04-16 DIAGNOSIS — R269 Unspecified abnormalities of gait and mobility: Secondary | ICD-10-CM | POA: Diagnosis not present

## 2022-04-16 DIAGNOSIS — M6281 Muscle weakness (generalized): Secondary | ICD-10-CM

## 2022-04-16 NOTE — Therapy (Signed)
OUTPATIENT PHYSICAL THERAPY NEURO TREATMENT   Patient Name: Paige Pena MRN: [id number] DOB:01/02/1941, 82 y.o., female Today's Date: 04/16/2022   PCP: Windell Moulding, NP REFERRING PROVIDER: Dr. Marcial Pacas  END OF SESSION:    Past Medical History:  Diagnosis Date   Acute cystitis    Anxiety    Aphasia    transsient aphasia Jan 2012   Asthmatic bronchitis    Cerebrovascular disease    Diverticulosis of colon    DJD (degenerative joint disease)    Dysphonia    Esophageal stricture    Fibromyalgia    Gastroparesis    GERD (gastroesophageal reflux disease)    Hematuria, microscopic    Hemorrhoids    Hypercholesteremia    IBS (irritable bowel syndrome)    Memory deficits 02/15/2013   RLS (restless legs syndrome)    TIA (transient ischemic attack)    Urinary incontinence    Vitamin D deficiency    Past Surgical History:  Procedure Laterality Date   ABDOMINAL HYSTERECTOMY  02/24/1993   cataract surgery Right 12/19/2021   corneal transplant for keratonconus     EYE SURGERY  02/25/2011   left foot fracture with ORIF  07/25/2004   Dr. Sharol Given   right bunion surgery     Patient Active Problem List   Diagnosis Date Noted   Pulmonary nodule 09/03/2021   Osteopenia 07/25/2021   Throat irritation 11/28/2019   HLD (hyperlipidemia)    Overactive bladder 05/08/2015   Aphasia    Cervical spondylitis ([place])    Paresthesias 03/21/2015   TIA (transient ischemic attack) 03/21/2015   Physical exam 10/05/2014   Abdominal pain 05/17/2014   Allergic rhinitis 05/17/2014   Fatigue 04/04/2014   Anxiety and depression 07/25/2013   Weight loss 07/25/2013   Memory deficits 02/15/2013   Lumbar spondylosis 09/21/2012   Tachycardia 09/25/2011   Palpitations 09/16/2010   Dysphonia 09/05/2010   Slowing of urinary stream 09/25/2009   Cerebrovascular disease 04/03/2008   DYSPHONIA 04/03/2008   Diverticulosis of large intestine 04/02/2008   Irritable bowel syndrome 04/02/2008    DEGENERATIVE JOINT DISEASE 04/02/2008   HEMATURIA, MICROSCOPIC, HX OF 04/02/2008   Vitamin D deficiency 03/28/2008   Fibromyalgia 03/28/2008   URINARY INCONTINENCE 03/28/2008   GERD 08/02/2007   Gastroparesis 08/02/2007   HEMORRHOIDS 07/30/2007   ESOPHAGEAL STRICTURE 07/30/2007   Anxiety state 07/30/2007    ONSET DATE: 03/27/22  REFERRING DIAG: R26.9 (ICD-10-CM) - Abnormal gait   THERAPY DIAG:  No diagnosis found.  Rationale for Evaluation and Treatment: Rehabilitation  SUBJECTIVE:  SUBJECTIVE STATEMENT: Not a whole lot going on, just getting old. I still have the problem with the left leg. It is weaker and I feel like I walk and go to the left.  Pt accompanied by: self  PERTINENT HISTORY: fibromyalgia, anxiety, TIA x 2 (last one was about 5 years ago); pt reports of having 4 screws in left foot from fractured foot years ago from fall.  PAIN:  Are you having pain? No  PRECAUTIONS: None  WEIGHT BEARING RESTRICTIONS: No  FALLS: Has patient fallen in last 6 months? No  LIVING ENVIRONMENT: Lives with: lives with their spouse Lives in: House/apartment Stairs: Yes: External: 3 steps; can reach both Has following equipment at home: None  PLOF: Independent  PATIENT GOALS: improve balance with walking  OBJECTIVE:   DIAGNOSTIC FINDINGS: 03/26/22 Cervical MRI MRI cervical spine (without) demonstrating: - At C3-4 disc bulging and facet hypertrophy with mild spinal stenosis and severe bilateral foraminal stenosis. - At C6-7 disc bulging with mild spinal stenosis and mild bilateral foraminal stenosis. - At C4-5, C5-6 uncovertebral joint hypertrophy with moderate bilateral foraminal stenosis. -Mild progression of degenerative changes since 2017.  COGNITION: Overall cognitive status: Within  functional limits for tasks assessed   LOWER EXTREMITY MMT:    MMT Right Eval Left Eval  Hip flexion 5 4  Hip extension    Hip abduction 5 5  Hip adduction 5 5  Hip internal rotation    Hip external rotation    Knee flexion 5 5  Knee extension 5 5  Ankle dorsiflexion 5 4  Ankle plantarflexion 5 4  Ankle inversion    Ankle eversion    (Blank rows = not tested)  FUNCTIONAL TESTS:  5 times sit to stand: 13.6 sec without UE support FGA: 27/30 MCTSIB: Condition 1: Avg of 3 trials: 30 sec, Condition 2: Avg of 3 trials: 30 sec, Condition 3: Avg of 3 trials: 30 sec, Condition 4: Avg of 3 trials: 30 sec, and Total Score: 120/120   TODAY'S TREATMENT:                                                                                                                              DATE:  04/16/22 NuStep L5 x45mns  Walking catching ball, direction changes Resisted gait 20# 4 way x4 Standing on airex hitting volleyball Marching on airex   04/10/22 NuStep L5 x638ms Walking on beam Standing on airex feet together, tandem then with eyes closed 3-5s secs at best with eyes closed  Leg ext 10# 2x10 HS curls 20# 2x10  Leg press 20# 2x10 STS on airex 2x10   PATIENT EDUCATION: Education details: Pt educated on evaluation findings, discussed goals Person educated: Patient Education method: Explanation Education comprehension: verbalized understanding  HOME EXERCISE PROGRAM: TBD  GOALS: Goals reviewed with patient? Yes  SHORT TERM GOALS: Target date: 05/01/2022   Patient will demo 30/30 on FGA to improve dynamic balance with functional mobility Baseline: 27/30 (  04/03/22) Goal status: INITIAL  2.  Pt will demo <12 seconds with 5x sit to stand to improve functional strength with transfers. Baseline: 13.6 sec (no UE support)(04/03/22) Goal status: INITIAL   LONG TERM GOALS: Target date: 05/15/2022    Pt will be I and compliant with HEP to self manage her symptoms. Baseline: HEP to  issued in future sessions. Goal status: INITIAL    ASSESSMENT:  CLINICAL IMPRESSION: Patient reports tiredness today. Has some difficulty with resisted gait, slight LOB but able to regain with light touch. Increased sway on airex with volleyball hits and marching- CGA.   OBJECTIVE IMPAIRMENTS: Abnormal gait, decreased balance, decreased endurance, difficulty walking, decreased strength, increased muscle spasms, postural dysfunction, and pain.   ACTIVITY LIMITATIONS: lifting, sitting, squatting, and transfers  PARTICIPATION LIMITATIONS: cleaning, shopping, and community activity  PERSONAL FACTORS: Age, Past/current experiences, and Time since onset of injury/illness/exacerbation are also affecting patient's functional outcome.   REHAB POTENTIAL: Excellent  CLINICAL DECISION MAKING: Stable/uncomplicated  EVALUATION COMPLEXITY: Low  PLAN:  PT FREQUENCY: 2x/week  PT DURATION: 6 weeks (8 sessions plus eval)  PLANNED INTERVENTIONS: Therapeutic exercises, Therapeutic activity, Neuromuscular re-education, Balance training, Gait training, Patient/Family education, Self Care, Joint mobilization, Joint manipulation, Stair training, Orthotic/Fit training, Spinal mobilization, Cryotherapy, Moist heat, Manual therapy, and Re-evaluation  PLAN FOR NEXT SESSION: Continue to work on dynamic gait with eyes closed, on non-compliant surfaces, static balance with eyes closed and non compliant surface (work on managing excessive forward sway with eyes closed); functional strength   [employer], PT 04/16/2022, 2:54 PM

## 2022-04-17 NOTE — Telephone Encounter (Signed)
Pt called and stated she would like a nurse to call her and go over MRI results.

## 2022-04-18 ENCOUNTER — Ambulatory Visit (INDEPENDENT_AMBULATORY_CARE_PROVIDER_SITE_OTHER): Payer: Medicare HMO | Admitting: Internal Medicine

## 2022-04-18 ENCOUNTER — Encounter: Payer: Self-pay | Admitting: Internal Medicine

## 2022-04-18 ENCOUNTER — Encounter: Payer: Medicare HMO | Admitting: Internal Medicine

## 2022-04-18 VITALS — BP 140/80 | HR 82 | Temp 97.4°F | Resp 18 | Ht 59.0 in | Wt 121.1 lb

## 2022-04-18 DIAGNOSIS — G459 Transient cerebral ischemic attack, unspecified: Secondary | ICD-10-CM | POA: Diagnosis not present

## 2022-04-18 DIAGNOSIS — G629 Polyneuropathy, unspecified: Secondary | ICD-10-CM | POA: Diagnosis not present

## 2022-04-18 DIAGNOSIS — R002 Palpitations: Secondary | ICD-10-CM

## 2022-04-18 DIAGNOSIS — I10 Essential (primary) hypertension: Secondary | ICD-10-CM

## 2022-04-18 DIAGNOSIS — R351 Nocturia: Secondary | ICD-10-CM

## 2022-04-18 DIAGNOSIS — K219 Gastro-esophageal reflux disease without esophagitis: Secondary | ICD-10-CM | POA: Diagnosis not present

## 2022-04-18 DIAGNOSIS — E785 Hyperlipidemia, unspecified: Secondary | ICD-10-CM

## 2022-04-18 DIAGNOSIS — M797 Fibromyalgia: Secondary | ICD-10-CM

## 2022-04-18 MED ORDER — ATORVASTATIN CALCIUM 20 MG PO TABS: 20.0000 mg | ORAL_TABLET | ORAL | 3 refills | Status: DC

## 2022-04-18 NOTE — Patient Instructions (Addendum)
Can try [facility] Will Discuss with Dr Claudia Desanctis.

## 2022-04-18 NOTE — Progress Notes (Unsigned)
Location:      Place of Service:   [facility] officew  Provider:   Code Status:  Goals of Care:     04/18/2022    3:25 PM  Advanced Directives  Does Patient Have a Medical Advance Directive? No  Would patient like information on creating a medical advance directive? No - Patient declined     Chief Complaint  Patient presents with   Acute Visit    Feeling weak, tired with no dizziness and no cough, patient states that her BP has been up lately in the 140's.    HPI: Patient is a 82 y.o. female seen today for medical management of chronic diseases.    Patient came for follow up  Her acute issues today Hypertension Recently BP running high She was started on Myrbetriq by urology in 09/23 Since then in Nov she has been watching her BO and it goes more then 140 She called her pharmacy and has stopped her Mybetriq last week She wanted to discuss that today Nocturia and h/o Hematuria Seen By Dr Claudia Desanctis and had detail work up Neuropathy Dr Krista Blue is seeing her and she had Cervical  MRI which shows  severe Bilateral Foraminal Stenosis at different levels On Full dose of Aspirin for previous h/o TIA and also on statins Fibromyalgia Takes Cymblata Also on Vistaril prn but not really needing it right now  Recent Cataract with Prolong Recovery Having some dizziness due to vision issues  Palpitations Has Heart monitor by Dr Jenkins Rouge  Lives with her husband Still drives Has daughters  out of state   Past Medical History:  Diagnosis Date   Acute cystitis    Anxiety    Aphasia    transsient aphasia Jan 2012   Asthmatic bronchitis    Cerebrovascular disease    Diverticulosis of colon    DJD (degenerative joint disease)    Dysphonia    Esophageal stricture    Fibromyalgia    Gastroparesis    GERD (gastroesophageal reflux disease)    Hematuria, microscopic    Hemorrhoids    Hypercholesteremia    IBS (irritable bowel syndrome)    Memory deficits 02/15/2013   RLS (restless legs  syndrome)    TIA (transient ischemic attack)    Urinary incontinence    Vitamin D deficiency     Past Surgical History:  Procedure Laterality Date   ABDOMINAL HYSTERECTOMY  02/24/1993   cataract surgery Right 12/19/2021   corneal transplant for keratonconus     EYE SURGERY  02/25/2011   left foot fracture with ORIF  07/25/2004   Dr. Sharol Given   right bunion surgery      Allergies  Allergen Reactions   Morphine Other (See Comments)    REACTION: lethargy/malaise   Metoclopramide Hcl Anxiety and Other (See Comments)    REACTION: anxious and out of her mind   Aricept [Donepezil Hcl]     nausea   Doxycycline Diarrhea   Penicillins Itching and Rash    Has patient had a PCN reaction causing immediate rash, facial/tongue/throat swelling, SOB or lightheadedness with hypotension: No Has patient had a PCN reaction causing severe rash involving mucus membranes or skin necrosis: No Has patient had a PCN reaction that required hospitalization No Has patient had a PCN reaction occurring within the last 10 years: Yes If all of the above answers are "NO", then may proceed with Cephalosporin use.     Prednisone Itching and Rash   Sulfonamide Derivatives Itching and Rash  Outpatient Encounter Medications as of 04/18/2022  Medication Sig   aspirin 325 MG EC tablet Take 325 mg by mouth daily.   Biotin 1000 MCG tablet Take 1,000 mcg by mouth 3 (three) times daily.   Cholecalciferol (VITAMIN D) 50 MCG (2000 UT) CAPS Take by mouth.   COLLAGEN-VITAMIN C PO Take by mouth.   cyanocobalamin (VITAMIN B12) 1000 MCG tablet Take 1 tablet (1,000 mcg total) by mouth daily.   DULoxetine (CYMBALTA) 60 MG capsule Take 1 capsule (60 mg total) by mouth daily.   fluticasone (FLONASE) 50 MCG/ACT nasal spray Place 2 sprays into both nostrils daily as needed for allergies.   hydrOXYzine (VISTARIL) 25 MG capsule Take 1 capsule (25 mg total) by mouth daily as needed. For panic attack or increased anxiety   MYRBETRIQ  50 MG TB24 tablet Take by mouth.   Omega-3 Fatty Acids (FISH OIL) 1000 MG CAPS Take by mouth.   omeprazole (PRILOSEC) 40 MG capsule TAKE 1 CAPSULE TWICE DAILY   prednisoLONE acetate (PRED FORTE) 1 % ophthalmic suspension Place 1 drop into both eyes as directed.   [DISCONTINUED] atorvastatin (LIPITOR) 20 MG tablet Take 1 tablet (20 mg total) by mouth every Monday, Wednesday, and Friday.   atorvastatin (LIPITOR) 20 MG tablet Take 1 tablet (20 mg total) by mouth every Monday, Wednesday, and Friday.   No facility-administered encounter medications on file as of 04/18/2022.    Review of Systems:  Review of Systems  Constitutional:  Negative for activity change and appetite change.  HENT: Negative.    Eyes:  Positive for visual disturbance.  Respiratory:  Negative for cough and shortness of breath.   Cardiovascular:  Positive for palpitations. Negative for leg swelling.  Gastrointestinal:  Negative for constipation.  Genitourinary:  Positive for frequency.  Musculoskeletal:  Positive for myalgias. Negative for arthralgias and gait problem.  Skin: Negative.   Neurological:  Negative for dizziness and weakness.  Psychiatric/Behavioral:  Negative for confusion, dysphoric mood and sleep disturbance.     Health Maintenance  Topic Date Due   DTaP/Tdap/Td (1 - Tdap) Never done   DEXA SCAN  07/10/2021   Medicare Annual Wellness (AWV)  07/12/2022   MAMMOGRAM  03/20/2023   Pneumonia Vaccine 26+ Years old  Completed   INFLUENZA VACCINE  Completed   COVID-19 Vaccine  Completed   HPV VACCINES  Aged Out   Zoster Vaccines- Shingrix  Discontinued    Physical Exam: Vitals:   04/18/22 1519  BP: (!) 140/80  Pulse: 82  Resp: 18  Temp: (!) 97.4 F (36.3 C)  SpO2: 96%  Weight: 121 lb 2 oz (54.9 kg)  Height: '4\' 11"'$  (1.499 m)   Body mass index is 24.46 kg/m. Physical Exam Vitals reviewed.  Constitutional:      Appearance: Normal appearance.  HENT:     Head: Normocephalic.     Nose: Nose  normal.     Mouth/Throat:     Mouth: Mucous membranes are moist.     Pharynx: Oropharynx is clear.  Eyes:     Pupils: Pupils are equal, round, and reactive to light.  Cardiovascular:     Rate and Rhythm: Normal rate and regular rhythm.     Pulses: Normal pulses.     Heart sounds: Normal heart sounds. No murmur heard. Pulmonary:     Effort: Pulmonary effort is normal.     Breath sounds: Normal breath sounds.  Abdominal:     General: Abdomen is flat. Bowel sounds are normal.  Palpations: Abdomen is soft.  Musculoskeletal:        General: No swelling.     Cervical back: Neck supple.  Skin:    General: Skin is warm.  Neurological:     General: No focal deficit present.     Mental Status: She is alert and oriented to person, place, and time.  Psychiatric:        Mood and Affect: Mood normal.        Thought Content: Thought content normal.     Labs reviewed: Basic Metabolic Panel: Recent Labs    07/25/21 1446 04/18/22 1613  NA 139  --   K 3.8  --   CL 102  --   CO2 30  --   GLUCOSE 83  --   BUN 20  --   CREATININE 0.79  --   CALCIUM 9.4  --   TSH 1.30 1.07   Liver Function Tests: Recent Labs    07/25/21 1446  AST 21  ALT 11  ALKPHOS 83  BILITOT 0.5  PROT 7.0  ALBUMIN 4.1   No results for input(s): "LIPASE", "AMYLASE" in the last 8760 hours. No results for input(s): "AMMONIA" in the last 8760 hours. CBC: Recent Labs    07/25/21 1446  WBC 4.6  NEUTROABS 3.2  HGB 13.2  HCT 39.7  MCV 90.6  PLT 158.0   Lipid Panel: Recent Labs    07/25/21 1446  CHOL 228*  HDL 78.10  LDLCALC 137*  TRIG 65.0  CHOLHDL 3   Lab Results  Component Value Date   HGBA1C 5.6 03/21/2015    Procedures since last visit: MR CERVICAL SPINE WO CONTRAST  Result Date: 03/27/2022 Table formatting from the original result was not included. [facility] NEUROIMAG[facility] REPORT STUDY DATE: 03/26/22 PATIENT NAME: KALIEGH ARMENT DOB: 02-Mar-1940 MRN: [id number]  ORDERING CLINICIAN: Marcial Pacas, MD CLINICAL HISTORY: 82 y.o. year old female with: 1. Paresthesias  2. Cervical spondylitis (HCC)   EXAM: MR CERVICAL SPINE WO CONTRAST TECHNIQUE: MRI of the cervical spine was obtained utilizing multiplanar, multiecho pulse sequences. CONTRAST: Diagnostic Product Medications (last 72 hours)   None   COMPARISON: 05/08/15 IMAGING SITE: [facility] [address] [place] [address] FINDINGS: On sagittal views the vertebral bodies have normal height and alignment.  Degenerative spondylosis and disc bulging at C2-3, C5-6, C6-7, T1-2 and T2-3 levels.  The spinal cord is normal in size and appearance. The posterior fossa, pituitary gland and paraspinal soft tissues are unremarkable.  On axial views: C2-3 disc bulging with no spinal stenosis or foraminal narrowing C3-4 disc bulging and facet hypertrophy with mild spinal stenosis and severe bilateral foraminal stenosis C4-5 uncovertebral joint hypertrophy with moderate bilateral foraminal stenosis C5-6 uncovertebral joint hypertrophy with moderate bilateral foraminal stenosis C6-7 disc bulging with mild spinal stenosis and mild bilateral foraminal stenosis C7-T1 no spinal stenosis or foraminal narrowing T1-2 no spinal stenosis or foraminal narrowing Limited views of the soft tissues of the head and neck are unremarkable.   MRI cervical spine (without) demonstrating: - At C3-4 disc bulging and facet hypertrophy with mild spinal stenosis and severe bilateral foraminal stenosis. - At C6-7 disc bulging with mild spinal stenosis and mild bilateral foraminal stenosis. - At C4-5, C5-6 uncovertebral joint hypertrophy with moderate bilateral foraminal stenosis. -Mild progression of degenerative changes since 2017. INTERPRETING PHYSICIAN: Penni Bombard, MD Certified in Neurology, Neurophysiology and Neuroimaging [facility] [address] [place], [place] [address] [phone]    Assessment/Plan 1. Hyperlipidemia, unspecified hyperlipidemia type  - atorvastatin (LIPITOR) 20 MG tablet; Take 1 tablet (20 mg total) by mouth every Monday, Wednesday, and Friday.  Dispense: 30 tablet; Refill: 3 - TSH  2. Primary hypertension ? Due to Mybetriq She has stopped it now I told her to follow her BP Regular and in next few weeks if it is still elevated she would need Treatment   3. Palpitations Has heart Monitor  4. Fibromyalgia On Cymbalta  5. Neuropathy Follow with With Dr Krista Blue  6. TIA (transient ischemic attack) On aspirin and Statin  7. Gastroesophageal reflux disease without esophagitis Prilosec  8. Nocturia Will Discontinue Myrbetriq due to Hypertension She will follow with Dr Claudia Desanctis    Labs/tests ordered:  * No order type specified * Next appt:  05/01/2022

## 2022-04-19 LAB — TSH: TSH: 1.07 mIU/L (ref 0.40–4.50)

## 2022-04-22 DIAGNOSIS — Z90710 Acquired absence of both cervix and uterus: Secondary | ICD-10-CM | POA: Diagnosis not present

## 2022-04-22 DIAGNOSIS — Z01419 Encounter for gynecological examination (general) (routine) without abnormal findings: Secondary | ICD-10-CM | POA: Diagnosis not present

## 2022-04-22 DIAGNOSIS — R32 Unspecified urinary incontinence: Secondary | ICD-10-CM | POA: Diagnosis not present

## 2022-04-22 NOTE — Therapy (Signed)
OUTPATIENT PHYSICAL THERAPY NEURO TREATMENT   Patient Name: Paige Pena MRN: [id number] DOB:[birth year], 82 y.o., female Today's Date: 04/22/2022   PCP: Windell Moulding, NP REFERRING PROVIDER: Dr. Marcial Pacas  END OF SESSION:    Past Medical History:  Diagnosis Date   Acute cystitis    Anxiety    Aphasia    transsient aphasia Jan 2012   Asthmatic bronchitis    Cerebrovascular disease    Diverticulosis of colon    DJD (degenerative joint disease)    Dysphonia    Esophageal stricture    Fibromyalgia    Gastroparesis    GERD (gastroesophageal reflux disease)    Hematuria, microscopic    Hemorrhoids    Hypercholesteremia    IBS (irritable bowel syndrome)    Memory deficits 02/15/2013   RLS (restless legs syndrome)    TIA (transient ischemic attack)    Urinary incontinence    Vitamin D deficiency    Past Surgical History:  Procedure Laterality Date   ABDOMINAL HYSTERECTOMY  02/24/1993   cataract surgery Right 12/19/2021   corneal transplant for keratonconus     EYE SURGERY  02/25/2011   left foot fracture with ORIF  07/25/2004   Dr. Sharol Given   right bunion surgery     Patient Active Problem List   Diagnosis Date Noted   Pulmonary nodule 09/03/2021   Osteopenia 07/25/2021   Throat irritation 11/28/2019   HLD (hyperlipidemia)    Overactive bladder 05/08/2015   Aphasia    Cervical spondylitis ([place])    Paresthesias 03/21/2015   TIA (transient ischemic attack) 03/21/2015   Physical exam 10/05/2014   Abdominal pain 05/17/2014   Allergic rhinitis 05/17/2014   Fatigue 04/04/2014   Anxiety and depression 07/25/2013   Weight loss 07/25/2013   Memory deficits 02/15/2013   Lumbar spondylosis 09/21/2012   Tachycardia 09/25/2011   Palpitations 09/16/2010   Dysphonia 09/05/2010   Slowing of urinary stream 09/25/2009   Cerebrovascular disease 04/03/2008   DYSPHONIA 04/03/2008   Diverticulosis of large intestine 04/02/2008   Irritable bowel syndrome 04/02/2008    DEGENERATIVE JOINT DISEASE 04/02/2008   HEMATURIA, MICROSCOPIC, HX OF 04/02/2008   Vitamin D deficiency 03/28/2008   Fibromyalgia 03/28/2008   URINARY INCONTINENCE 03/28/2008   GERD 08/02/2007   Gastroparesis 08/02/2007   HEMORRHOIDS 07/30/2007   ESOPHAGEAL STRICTURE 07/30/2007   Anxiety state 07/30/2007    ONSET DATE: 03/27/22  REFERRING DIAG: R26.9 (ICD-10-CM) - Abnormal gait   THERAPY DIAG:  No diagnosis found.  Rationale for Evaluation and Treatment: Rehabilitation  SUBJECTIVE:  SUBJECTIVE STATEMENT: Not a whole lot going on, just getting old. I still have the problem with the left leg. It is weaker and I feel like I walk and go to the left.  Pt accompanied by: self  PERTINENT HISTORY: fibromyalgia, anxiety, TIA x 2 (last one was about 5 years ago); pt reports of having 4 screws in left foot from fractured foot years ago from fall.  PAIN:  Are you having pain? No  PRECAUTIONS: None  WEIGHT BEARING RESTRICTIONS: No  FALLS: Has patient fallen in last 6 months? No  LIVING ENVIRONMENT: Lives with: lives with their spouse Lives in: House/apartment Stairs: Yes: External: 3 steps; can reach both Has following equipment at home: None  PLOF: Independent  PATIENT GOALS: improve balance with walking  OBJECTIVE:   DIAGNOSTIC FINDINGS: 03/26/22 Cervical MRI MRI cervical spine (without) demonstrating: - At C3-4 disc bulging and facet hypertrophy with mild spinal stenosis and severe bilateral foraminal stenosis. - At C6-7 disc bulging with mild spinal stenosis and mild bilateral foraminal stenosis. - At C4-5, C5-6 uncovertebral joint hypertrophy with moderate bilateral foraminal stenosis. -Mild progression of degenerative changes since 2017.  COGNITION: Overall cognitive status: Within  functional limits for tasks assessed   LOWER EXTREMITY MMT:    MMT Right Eval Left Eval  Hip flexion 5 4  Hip extension    Hip abduction 5 5  Hip adduction 5 5  Hip internal rotation    Hip external rotation    Knee flexion 5 5  Knee extension 5 5  Ankle dorsiflexion 5 4  Ankle plantarflexion 5 4  Ankle inversion    Ankle eversion    (Blank rows = not tested)  FUNCTIONAL TESTS:  5 times sit to stand: 13.6 sec without UE support FGA: 27/30 MCTSIB: Condition 1: Avg of 3 trials: 30 sec, Condition 2: Avg of 3 trials: 30 sec, Condition 3: Avg of 3 trials: 30 sec, Condition 4: Avg of 3 trials: 30 sec, and Total Score: 120/120   TODAY'S TREATMENT:                                                                                                                              DATE:  04/23/22 NuStep 3 way hip 3#  Tandem walking on beam Step ups 6" STS on airex Leg press   04/16/22 NuStep L5 x46mns  Walking catching ball, direction changes Resisted gait 20# 4 way x4 Standing on airex hitting volleyball Marching on airex   04/10/22 NuStep L5 x67ms Walking on beam Standing on airex feet together, tandem then with eyes closed 3-5s secs at best with eyes closed  Leg ext 10# 2x10 HS curls 20# 2x10  Leg press 20# 2x10 STS on airex 2x10   PATIENT EDUCATION: Education details: Pt educated on evaluation findings, discussed goals Person educated: Patient Education method: Explanation Education comprehension: verbalized understanding  HOME EXERCISE PROGRAM: TBD  GOALS: Goals reviewed with patient? Yes  SHORT TERM  GOALS: Target date: 05/01/2022   Patient will demo 30/30 on FGA to improve dynamic balance with functional mobility Baseline: 27/30 (04/03/22) Goal status: INITIAL  2.  Pt will demo <12 seconds with 5x sit to stand to improve functional strength with transfers. Baseline: 13.6 sec (no UE support)(04/03/22) Goal status: INITIAL   LONG TERM GOALS: Target date:  05/15/2022    Pt will be I and compliant with HEP to self manage her symptoms. Baseline: HEP to issued in future sessions. Goal status: INITIAL    ASSESSMENT:  CLINICAL IMPRESSION: Patient reports tiredness today. Has some difficulty with resisted gait, slight LOB but able to regain with light touch. Increased sway on airex with volleyball hits and marching- CGA.   OBJECTIVE IMPAIRMENTS: Abnormal gait, decreased balance, decreased endurance, difficulty walking, decreased strength, increased muscle spasms, postural dysfunction, and pain.   ACTIVITY LIMITATIONS: lifting, sitting, squatting, and transfers  PARTICIPATION LIMITATIONS: cleaning, shopping, and community activity  PERSONAL FACTORS: Age, Past/current experiences, and Time since onset of injury/illness/exacerbation are also affecting patient's functional outcome.   REHAB POTENTIAL: Excellent  CLINICAL DECISION MAKING: Stable/uncomplicated  EVALUATION COMPLEXITY: Low  PLAN:  PT FREQUENCY: 2x/week  PT DURATION: 6 weeks (8 sessions plus eval)  PLANNED INTERVENTIONS: Therapeutic exercises, Therapeutic activity, Neuromuscular re-education, Balance training, Gait training, Patient/Family education, Self Care, Joint mobilization, Joint manipulation, Stair training, Orthotic/Fit training, Spinal mobilization, Cryotherapy, Moist heat, Manual therapy, and Re-evaluation  PLAN FOR NEXT SESSION: Continue to work on dynamic gait with eyes closed, on non-compliant surfaces, static balance with eyes closed and non compliant surface (work on managing excessive forward sway with eyes closed); functional strength   [employer], PT 04/22/2022, 12:02 PM

## 2022-04-23 ENCOUNTER — Ambulatory Visit: Payer: Medicare HMO

## 2022-04-23 DIAGNOSIS — M6281 Muscle weakness (generalized): Secondary | ICD-10-CM

## 2022-04-23 DIAGNOSIS — R269 Unspecified abnormalities of gait and mobility: Secondary | ICD-10-CM

## 2022-04-24 ENCOUNTER — Ambulatory Visit: Payer: Medicare HMO

## 2022-04-24 DIAGNOSIS — M6281 Muscle weakness (generalized): Secondary | ICD-10-CM | POA: Diagnosis not present

## 2022-04-24 DIAGNOSIS — R269 Unspecified abnormalities of gait and mobility: Secondary | ICD-10-CM | POA: Diagnosis not present

## 2022-04-24 NOTE — Therapy (Signed)
OUTPATIENT PHYSICAL THERAPY NEURO TREATMENT   Patient Name: Paige Pena MRN: [id number] DOB:[birth year], 82 y.o., female Today's Date: 04/24/2022   PCP: Windell Moulding, NP REFERRING PROVIDER: Dr. Marcial Pacas  END OF SESSION:  PT End of Session - 04/24/22 1327     Visit Number 5    Number of Visits 9    Date for PT Re-Evaluation 05/15/22    Authorization Type [employer]    Authorization - Number of Visits 8    PT Start Time 1315    PT Stop Time 1400    PT Time Calculation (min) 45 min    Activity Tolerance Patient tolerated treatment well    Behavior During Therapy [facility] for tasks assessed/performed              Past Medical History:  Diagnosis Date   Acute cystitis    Anxiety    Aphasia    transsient aphasia Jan 2012   Asthmatic bronchitis    Cerebrovascular disease    Diverticulosis of colon    DJD (degenerative joint disease)    Dysphonia    Esophageal stricture    Fibromyalgia    Gastroparesis    GERD (gastroesophageal reflux disease)    Hematuria, microscopic    Hemorrhoids    Hypercholesteremia    IBS (irritable bowel syndrome)    Memory deficits 02/15/2013   RLS (restless legs syndrome)    TIA (transient ischemic attack)    Urinary incontinence    Vitamin D deficiency    Past Surgical History:  Procedure Laterality Date   ABDOMINAL HYSTERECTOMY  02/24/1993   cataract surgery Right 12/19/2021   corneal transplant for keratonconus     EYE SURGERY  02/25/2011   left foot fracture with ORIF  07/25/2004   Dr. Sharol Given   right bunion surgery     Patient Active Problem List   Diagnosis Date Noted   Pulmonary nodule 09/03/2021   Osteopenia 07/25/2021   Throat irritation 11/28/2019   HLD (hyperlipidemia)    Overactive bladder 05/08/2015   Aphasia    Cervical spondylitis ([place])    Paresthesias 03/21/2015   TIA (transient ischemic attack) 03/21/2015   Physical exam 10/05/2014   Abdominal pain 05/17/2014   Allergic rhinitis 05/17/2014   Fatigue 04/04/2014    Anxiety and depression 07/25/2013   Weight loss 07/25/2013   Memory deficits 02/15/2013   Lumbar spondylosis 09/21/2012   Tachycardia 09/25/2011   Palpitations 09/16/2010   Dysphonia 09/05/2010   Slowing of urinary stream 09/25/2009   Cerebrovascular disease 04/03/2008   DYSPHONIA 04/03/2008   Diverticulosis of large intestine 04/02/2008   Irritable bowel syndrome 04/02/2008   DEGENERATIVE JOINT DISEASE 04/02/2008   HEMATURIA, MICROSCOPIC, HX OF 04/02/2008   Vitamin D deficiency 03/28/2008   Fibromyalgia 03/28/2008   URINARY INCONTINENCE 03/28/2008   GERD 08/02/2007   Gastroparesis 08/02/2007   HEMORRHOIDS 07/30/2007   ESOPHAGEAL STRICTURE 07/30/2007   Anxiety state 07/30/2007    ONSET DATE: 03/27/22  REFERRING DIAG: R26.9 (ICD-10-CM) - Abnormal gait   THERAPY DIAG:  Muscle weakness (generalized)  Rationale for Evaluation and Treatment: Rehabilitation  SUBJECTIVE:  SUBJECTIVE STATEMENT: I am present, doing pretty good.   Pt accompanied by: self  PERTINENT HISTORY: fibromyalgia, anxiety, TIA x 2 (last one was about 5 years ago); pt reports of having 4 screws in left foot from fractured foot years ago from fall.  PAIN:  Are you having pain? No  PRECAUTIONS: None  WEIGHT BEARING RESTRICTIONS: No  FALLS: Has patient fallen in last 6 months? No  LIVING ENVIRONMENT: Lives with: lives with their spouse Lives in: House/apartment Stairs: Yes: External: 3 steps; can reach both Has following equipment at home: None  PLOF: Independent  PATIENT GOALS: improve balance with walking  OBJECTIVE:   DIAGNOSTIC FINDINGS: 03/26/22 Cervical MRI MRI cervical spine (without) demonstrating: - At C3-4 disc bulging and facet hypertrophy with mild spinal stenosis and severe bilateral foraminal  stenosis. - At C6-7 disc bulging with mild spinal stenosis and mild bilateral foraminal stenosis. - At C4-5, C5-6 uncovertebral joint hypertrophy with moderate bilateral foraminal stenosis. -Mild progression of degenerative changes since 2017.  COGNITION: Overall cognitive status: Within functional limits for tasks assessed   LOWER EXTREMITY MMT:    MMT Right Eval Left Eval  Hip flexion 5 4  Hip extension    Hip abduction 5 5  Hip adduction 5 5  Hip internal rotation    Hip external rotation    Knee flexion 5 5  Knee extension 5 5  Ankle dorsiflexion 5 4  Ankle plantarflexion 5 4  Ankle inversion    Ankle eversion    (Blank rows = not tested)  FUNCTIONAL TESTS:  5 times sit to stand: 13.6 sec without UE support FGA: 27/30 MCTSIB: Condition 1: Avg of 3 trials: 30 sec, Condition 2: Avg of 3 trials: 30 sec, Condition 3: Avg of 3 trials: 30 sec, Condition 4: Avg of 3 trials: 30 sec, and Total Score: 120/120   TODAY'S TREATMENT:                                                                                                                              DATE:  04/24/22 Bike L2 x11mns  Leg ext 10# 2x10 HS curls 20# 2x10 Resisted side steps 20# 5x each side  Standing on airex feet together, then eyes closed  Side steps on airex  Hitting ball standing on airex  04/23/22 NuStep L5 x560ms  3 way hip 3# 2x10 STS on airex 2x10 Step ups 6" from airex Leg press 20# 2x10   04/16/22 NuStep L5 x6m30m  Walking catching ball, direction changes Resisted gait 20# 4 way x4 Standing on airex hitting volleyball Marching on airex   04/10/22 NuStep L5 x6mi8mWalking on beam Standing on airex feet together, tandem then with eyes closed 3-5s secs at best with eyes closed  Leg ext 10# 2x10 HS curls 20# 2x10  Leg press 20# 2x10 STS on airex 2x10   PATIENT EDUCATION: Education details: Pt educated on evaluation findings, discussed goals Person educated: Patient Education method:  Explanation Education comprehension: verbalized understanding  HOME EXERCISE PROGRAM: TBD  GOALS: Goals reviewed with patient? Yes  SHORT TERM GOALS: Target date: 05/01/2022   Patient will demo 30/30 on FGA to improve dynamic balance with functional mobility Baseline: 27/30 (04/03/22) Goal status: INITIAL  2.  Pt will demo <12 seconds with 5x sit to stand to improve functional strength with transfers. Baseline: 13.6 sec (no UE support)(04/03/22) Goal status: INITIAL   LONG TERM GOALS: Target date: 05/15/2022    Pt will be I and compliant with HEP to self manage her symptoms. Baseline: HEP to issued in future sessions. Goal status: INITIAL    ASSESSMENT:  CLINICAL IMPRESSION: Continued to work on LE strength and balance. Increased swaying on airex especially with eyes closed. CGA needed with side step ups on airex.  OBJECTIVE IMPAIRMENTS: Abnormal gait, decreased balance, decreased endurance, difficulty walking, decreased strength, increased muscle spasms, postural dysfunction, and pain.   ACTIVITY LIMITATIONS: lifting, sitting, squatting, and transfers  PARTICIPATION LIMITATIONS: cleaning, shopping, and community activity  PERSONAL FACTORS: Age, Past/current experiences, and Time since onset of injury/illness/exacerbation are also affecting patient's functional outcome.   REHAB POTENTIAL: Excellent  CLINICAL DECISION MAKING: Stable/uncomplicated  EVALUATION COMPLEXITY: Low  PLAN:  PT FREQUENCY: 2x/week  PT DURATION: 6 weeks (8 sessions plus eval)  PLANNED INTERVENTIONS: Therapeutic exercises, Therapeutic activity, Neuromuscular re-education, Balance training, Gait training, Patient/Family education, Self Care, Joint mobilization, Joint manipulation, Stair training, Orthotic/Fit training, Spinal mobilization, Cryotherapy, Moist heat, Manual therapy, and Re-evaluation  PLAN FOR NEXT SESSION: Continue to work on dynamic gait with eyes closed, on non-compliant surfaces,  static balance with eyes closed and non compliant surface (work on managing excessive forward sway with eyes closed); functional strength   [employer], PT 04/24/2022, 1:57 PM

## 2022-04-28 ENCOUNTER — Ambulatory Visit: Payer: Medicare HMO

## 2022-04-29 NOTE — Therapy (Signed)
OUTPATIENT PHYSICAL THERAPY NEURO TREATMENT   Patient Name: Paige Pena MRN: [id number] DOB:05/23/1940, 82 y.o., female Today's Date: 04/30/2022   PCP: Windell Moulding, NP REFERRING PROVIDER: Dr. Marcial Pacas  END OF SESSION:  PT End of Session - 04/30/22 1322     Visit Number 6    Number of Visits 9    Date for PT Re-Evaluation 05/15/22    Authorization Type [employer]    Authorization - Number of Visits 8    PT Start Time [id number]    PT Stop Time 1400    PT Time Calculation (min) 38 min    Activity Tolerance Patient tolerated treatment well    Behavior During Therapy WFL for tasks assessed/performed               Past Medical History:  Diagnosis Date   Acute cystitis    Anxiety    Aphasia    transsient aphasia Jan 2012   Asthmatic bronchitis    Cerebrovascular disease    Diverticulosis of colon    DJD (degenerative joint disease)    Dysphonia    Esophageal stricture    Fibromyalgia    Gastroparesis    GERD (gastroesophageal reflux disease)    Hematuria, microscopic    Hemorrhoids    Hypercholesteremia    IBS (irritable bowel syndrome)    Memory deficits 02/15/2013   RLS (restless legs syndrome)    TIA (transient ischemic attack)    Urinary incontinence    Vitamin D deficiency    Past Surgical History:  Procedure Laterality Date   ABDOMINAL HYSTERECTOMY  02/24/1993   cataract surgery Right 12/19/2021   corneal transplant for keratonconus     EYE SURGERY  02/25/2011   left foot fracture with ORIF  07/25/2004   Dr. Sharol Given   right bunion surgery     Patient Active Problem List   Diagnosis Date Noted   Pulmonary nodule 09/03/2021   Osteopenia 07/25/2021   Throat irritation 11/28/2019   HLD (hyperlipidemia)    Overactive bladder 05/08/2015   Aphasia    Cervical spondylitis ([place])    Paresthesias 03/21/2015   TIA (transient ischemic attack) 03/21/2015   Physical exam 10/05/2014   Abdominal pain 05/17/2014   Allergic rhinitis 05/17/2014   Fatigue 04/04/2014    Anxiety and depression 07/25/2013   Weight loss 07/25/2013   Memory deficits 02/15/2013   Lumbar spondylosis 09/21/2012   Tachycardia 09/25/2011   Palpitations 09/16/2010   Dysphonia 09/05/2010   Slowing of urinary stream 09/25/2009   Cerebrovascular disease 04/03/2008   DYSPHONIA 04/03/2008   Diverticulosis of large intestine 04/02/2008   Irritable bowel syndrome 04/02/2008   DEGENERATIVE JOINT DISEASE 04/02/2008   HEMATURIA, MICROSCOPIC, HX OF 04/02/2008   Vitamin D deficiency 03/28/2008   Fibromyalgia 03/28/2008   URINARY INCONTINENCE 03/28/2008   GERD 08/02/2007   Gastroparesis 08/02/2007   HEMORRHOIDS 07/30/2007   ESOPHAGEAL STRICTURE 07/30/2007   Anxiety state 07/30/2007    ONSET DATE: 03/27/22  REFERRING DIAG: R26.9 (ICD-10-CM) - Abnormal gait   THERAPY DIAG:  Muscle weakness (generalized)  Abnormal gait  Rationale for Evaluation and Treatment: Rehabilitation  SUBJECTIVE:  SUBJECTIVE STATEMENT: I am present, I lost one of my contacts so it is very frustrating.   Pt accompanied by: self  PERTINENT HISTORY: fibromyalgia, anxiety, TIA x 2 (last one was about 5 years ago); pt reports of having 4 screws in left foot from fractured foot years ago from fall.  PAIN:  Are you having pain? No  PRECAUTIONS: None  WEIGHT BEARING RESTRICTIONS: No  FALLS: Has patient fallen in last 6 months? No  LIVING ENVIRONMENT: Lives with: lives with their spouse Lives in: House/apartment Stairs: Yes: External: 3 steps; can reach both Has following equipment at home: None  PLOF: Independent  PATIENT GOALS: improve balance with walking  OBJECTIVE:   DIAGNOSTIC FINDINGS: 03/26/22 Cervical MRI MRI cervical spine (without) demonstrating: - At C3-4 disc bulging and facet hypertrophy with  mild spinal stenosis and severe bilateral foraminal stenosis. - At C6-7 disc bulging with mild spinal stenosis and mild bilateral foraminal stenosis. - At C4-5, C5-6 uncovertebral joint hypertrophy with moderate bilateral foraminal stenosis. -Mild progression of degenerative changes since 2017.  COGNITION: Overall cognitive status: Within functional limits for tasks assessed   LOWER EXTREMITY MMT:    MMT Right Eval Left Eval  Hip flexion 5 4  Hip extension    Hip abduction 5 5  Hip adduction 5 5  Hip internal rotation    Hip external rotation    Knee flexion 5 5  Knee extension 5 5  Ankle dorsiflexion 5 4  Ankle plantarflexion 5 4  Ankle inversion    Ankle eversion    (Blank rows = not tested)  FUNCTIONAL TESTS:  5 times sit to stand: 13.6 sec without UE support FGA: 27/30 MCTSIB: Condition 1: Avg of 3 trials: 30 sec, Condition 2: Avg of 3 trials: 30 sec, Condition 3: Avg of 3 trials: 30 sec, Condition 4: Avg of 3 trials: 30 sec, and Total Score: 120/120   TODAY'S TREATMENT:                                                                                                                              DATE:  04/28/22 Step ups on airex on top of 6" step, then lateral step ups  Leg ext 10# 2x10 HS curls 15# 2x10 Walking with direction changes while catching ball  Walking on beam    04/24/22 Bike L2 x62mns  Leg ext 10# 2x10 HS curls 20# 2x10 Resisted side steps 20# 5x each side  Standing on airex feet together, then eyes closed  Side steps on airex  Hitting ball standing on airex  04/23/22 NuStep L5 x550ms  3 way hip 3# 2x10 STS on airex 2x10 Step ups 6" from airex Leg press 20# 2x10   04/16/22 NuStep L5 x6m9m  Walking catching ball, direction changes Resisted gait 20# 4 way x4 Standing on airex hitting volleyball Marching on airex   04/10/22 NuStep L5 x6mi48mWalking on beam Standing on airex feet together, tandem then with  eyes closed 3-5s secs at best with  eyes closed  Leg ext 10# 2x10 HS curls 20# 2x10  Leg press 20# 2x10 STS on airex 2x10   PATIENT EDUCATION: Education details: Pt educated on evaluation findings, discussed goals Person educated: Patient Education method: Explanation Education comprehension: verbalized understanding  HOME EXERCISE PROGRAM: TBD  GOALS: Goals reviewed with patient? Yes  SHORT TERM GOALS: Target date: 05/01/2022   Patient will demo 30/30 on FGA to improve dynamic balance with functional mobility Baseline: 27/30 (04/03/22) Goal status: INITIAL  2.  Pt will demo <12 seconds with 5x sit to stand to improve functional strength with transfers. Baseline: 13.6 sec (no UE support)(04/03/22) Goal status: INITIAL   LONG TERM GOALS: Target date: 05/15/2022  Pt will be I and compliant with HEP to self manage her symptoms. Baseline: HEP to issued in future sessions. Goal status: INITIAL    ASSESSMENT:  CLINICAL IMPRESSION: Continued to work on LE strength and balance. Patient lose one contact lens so she is only able to see out of one eye. Had to be careful with balance training due to visual deficit.   OBJECTIVE IMPAIRMENTS: Abnormal gait, decreased balance, decreased endurance, difficulty walking, decreased strength, increased muscle spasms, postural dysfunction, and pain.   ACTIVITY LIMITATIONS: lifting, sitting, squatting, and transfers  PARTICIPATION LIMITATIONS: cleaning, shopping, and community activity  PERSONAL FACTORS: Age, Past/current experiences, and Time since onset of injury/illness/exacerbation are also affecting patient's functional outcome.   REHAB POTENTIAL: Excellent  CLINICAL DECISION MAKING: Stable/uncomplicated  EVALUATION COMPLEXITY: Low  PLAN:  PT FREQUENCY: 2x/week  PT DURATION: 6 weeks (8 sessions plus eval)  PLANNED INTERVENTIONS: Therapeutic exercises, Therapeutic activity, Neuromuscular re-education, Balance training, Gait training, Patient/Family education, Self  Care, Joint mobilization, Joint manipulation, Stair training, Orthotic/Fit training, Spinal mobilization, Cryotherapy, Moist heat, Manual therapy, and Re-evaluation  PLAN FOR NEXT SESSION: Continue to work on dynamic gait with eyes closed, on non-compliant surfaces, static balance with eyes closed and non compliant surface (work on managing excessive forward sway with eyes closed); functional strength   [employer], PT 04/30/2022, 1:58 PM

## 2022-04-30 ENCOUNTER — Ambulatory Visit: Payer: Medicare HMO | Attending: Neurology

## 2022-04-30 DIAGNOSIS — M6281 Muscle weakness (generalized): Secondary | ICD-10-CM | POA: Insufficient documentation

## 2022-04-30 DIAGNOSIS — R269 Unspecified abnormalities of gait and mobility: Secondary | ICD-10-CM | POA: Insufficient documentation

## 2022-05-01 ENCOUNTER — Encounter: Payer: Self-pay | Admitting: Orthopedic Surgery

## 2022-05-01 ENCOUNTER — Ambulatory Visit (INDEPENDENT_AMBULATORY_CARE_PROVIDER_SITE_OTHER): Payer: Medicare HMO | Admitting: Orthopedic Surgery

## 2022-05-01 VITALS — BP 108/60 | HR 65 | Temp 97.5°F | Resp 16 | Ht 59.0 in | Wt 121.0 lb

## 2022-05-01 DIAGNOSIS — Z0000 Encounter for general adult medical examination without abnormal findings: Secondary | ICD-10-CM

## 2022-05-01 NOTE — Patient Instructions (Signed)
  Paige Pena , Thank you for taking time to come for your Medicare Wellness Visit. I appreciate your ongoing commitment to your health goals. Please review the following plan we discussed and let me know if I can assist you in the future.   These are the goals we discussed:  Goals      Increase physical activity     Increase activity, in gym 2 x week.      Patient Stated     Improve sleep pattern.         This is a list of the screening recommended for you and due dates:  Health Maintenance  Topic Date Due   DTaP/Tdap/Td vaccine (1 - Tdap) Never done   Mammogram  03/20/2023   Medicare Annual Wellness Visit  05/01/2023   DEXA scan (bone density measurement)  12/04/2023   Pneumonia Vaccine  Completed   Flu Shot  Completed   COVID-19 Vaccine  Completed   HPV Vaccine  Aged Out   Zoster (Shingles) Vaccine  Discontinued   Recommend Tdap vaccine at local pharmacy

## 2022-05-01 NOTE — Progress Notes (Signed)
Subjective:   Paige Pena is a 82 y.o. female who presents for Medicare Annual (Subsequent) preventive examination.  Review of Systems     Cardiac Risk Factors include: hypertension;advanced age (>32mn, >>32women);sedentary lifestyle;dyslipidemia     Objective:    Today's Vitals   05/01/22 1336 05/01/22 1350  BP: 108/60   Pulse: 65   Resp: 16   Temp: (!) 97.5 F (36.4 C)   SpO2: 99%   Weight: 121 lb (54.9 kg)   Height: '4\' 11"'$  (1.499 m)   PainSc:  0-No pain   Body mass index is 24.44 kg/m.     05/01/2022    1:44 PM 04/18/2022    3:25 PM 04/03/2022    2:13 PM 10/21/2021    3:37 PM 08/22/2021   10:23 AM 07/11/2021    9:09 AM 04/09/2021   12:44 AM  Advanced Directives  Does Patient Have a Medical Advance Directive? No No No No No No No  Would patient like information on creating a medical advance directive? No - Patient declined No - Patient declined No - Patient declined No - Patient declined No - Patient declined Yes (MAU/Ambulatory/Procedural Areas - Information given) Yes (ED - Information included in AVS)    Current Medications (verified) Outpatient Encounter Medications as of 05/01/2022  Medication Sig   aspirin 325 MG EC tablet Take 325 mg by mouth daily.   atorvastatin (LIPITOR) 20 MG tablet Take 1 tablet (20 mg total) by mouth every Monday, Wednesday, and Friday.   Biotin 1000 MCG tablet Take 1,000 mcg by mouth 3 (three) times daily.   Cholecalciferol (VITAMIN D) 50 MCG (2000 UT) CAPS Take by mouth.   COLLAGEN-VITAMIN C PO Take by mouth.   cyanocobalamin (VITAMIN B12) 1000 MCG tablet Take 1 tablet (1,000 mcg total) by mouth daily.   DULoxetine (CYMBALTA) 60 MG capsule Take 1 capsule (60 mg total) by mouth daily.   fluticasone (FLONASE) 50 MCG/ACT nasal spray Place 2 sprays into both nostrils daily as needed for allergies.   hydrOXYzine (VISTARIL) 25 MG capsule Take 1 capsule (25 mg total) by mouth daily as needed. For panic attack or increased anxiety   Omega-3  Fatty Acids (FISH OIL) 1000 MG CAPS Take by mouth.   omeprazole (PRILOSEC) 40 MG capsule TAKE 1 CAPSULE TWICE DAILY   prednisoLONE acetate (PRED FORTE) 1 % ophthalmic suspension Place 1 drop into both eyes as directed.   No facility-administered encounter medications on file as of 05/01/2022.    Allergies (verified) Morphine, Metoclopramide hcl, Aricept [donepezil hcl], Doxycycline, Penicillins, Prednisone, and Sulfonamide derivatives   History: Past Medical History:  Diagnosis Date   Acute cystitis    Anxiety    Aphasia    transsient aphasia Jan 2012   Asthmatic bronchitis    Cerebrovascular disease    Diverticulosis of colon    DJD (degenerative joint disease)    Dysphonia    Esophageal stricture    Fibromyalgia    Gastroparesis    GERD (gastroesophageal reflux disease)    Hematuria, microscopic    Hemorrhoids    Hypercholesteremia    IBS (irritable bowel syndrome)    Memory deficits 02/15/2013   RLS (restless legs syndrome)    TIA (transient ischemic attack)    Urinary incontinence    Vitamin D deficiency    Past Surgical History:  Procedure Laterality Date   ABDOMINAL HYSTERECTOMY  02/24/1993   cataract surgery Right 12/19/2021   corneal transplant for keratonconus     EYE  SURGERY  02/25/2011   left foot fracture with ORIF  07/25/2004   Dr. Sharol Given   right bunion surgery     Family History  Problem Relation Age of Onset   Hypertension Mother    Anemia Mother    Dementia Mother    Kidney disease Father    Hyperlipidemia Sister    Diabetes Brother    Lung cancer Brother    Dementia Maternal Grandmother    Diabetes Other        aunt,brother,uncle   Breast cancer Maternal Aunt    Social History   Socioeconomic History   Marital status: Married    Spouse name: Mallie Mussel   Number of children: 3   Years of education: college-2   Highest education level: Not on file  Occupational History    Employer: RETIRED  Tobacco Use   Smoking status: Never   Smokeless  tobacco: Never  Vaping Use   Vaping Use: Never used  Substance and Sexual Activity   Alcohol use: Yes    Comment: occ   Drug use: Never   Sexual activity: Not on file  Other Topics Concern   Not on file  Social History Narrative   Lennie Hummer   [place], daughter   Reya Schweder info release form to share her medical records with her children   Lives at home w/ her husband   Right-handed   Caffeine: tea on occasion   Social Determinants of Health   Financial Resource Strain: Low Risk  (05/01/2022)   Overall Financial Resource Strain (CARDIA)    Difficulty of Paying Living Expenses: Not hard at all  Food Insecurity: No Food Insecurity (05/01/2022)   Hunger Vital Sign    Worried About Running Out of Food in the Last Year: Never true    [place] in the Last Year: Never true  Transportation Needs: No Transportation Needs (05/01/2022)   PRAPARE - Hydrologist (Medical): No    Lack of Transportation (Non-Medical): No  Physical Activity: Sufficiently Active (05/01/2022)   Exercise Vital Sign    Days of Exercise per Week: 3 days    Minutes of Exercise per Session: 50 min  Stress: Stress Concern Present (05/01/2022)   [place]    Feeling of Stress : To some extent  Social Connections: Moderately Integrated (05/01/2022)   Social Connection and Isolation Panel [NHANES]    Frequency of Communication with Friends and Family: More than three times a week    Frequency of Social Gatherings with Friends and Family: Once a week    Attends Religious Services: More than 4 times per year    Active Member of [employer] or Organizations: No    Attends Music therapist: Never    Marital Status: Married    Tobacco Counseling Counseling given: Not Answered   Clinical Intake:  Pre-visit preparation completed: No  Pain : No/denies pain Pain Score: 0-No  pain     BMI - recorded: 24.44 Nutritional Risks: None Diabetes: No  How often do you need to have someone help you when you read instructions, pamphlets, or other written materials from your doctor or pharmacy?: 3 - Sometimes What is the last grade level you completed in school?: 2 years college  Diabetic?No  Interpreter Needed?: No      Activities of Daily Living    05/01/2022    1:56 PM 07/11/2021    9:12 AM  In your present state of health, do you have any difficulty performing the following activities:  Hearing?  1  Vision? 0 0  Difficulty concentrating or making decisions? 0 1  Walking or climbing stairs? 0 0  Dressing or bathing? 0 0  Doing errands, shopping? 0 0  Preparing Food and eating ? N N  Using the Toilet? N N  In the past six months, have you accidently leaked urine? Tempie Donning  Comment  wears pads for protection  Do you have problems with loss of bowel control? N N  Managing your Medications? N N  Managing your Finances? N N  Housekeeping or managing your Housekeeping? N N    Patient Care Team: Yvonna Alanis, NP as PCP - General (Adult Health Nurse Practitioner) Druscilla Brownie, MD as Consulting Physician (Dermatology) Marilynne Halsted, MD as Referring Physician (Ophthalmology) Ward Givens, NP as Nurse Practitioner (Neurology) Robley Fries, MD as Consulting Physician (Urology)  Indicate any recent Medical Services you may have received from other than [facility] providers in the past year (date may be approximate).     Assessment:   This is a routine wellness examination for [place].  Hearing/Vision screen Hearing Screening - Comments:: Some hearing concerns.  Vision Screening - Comments:: Some vision concerns. Patient last eye exam Feb 2024. Patient wears prescription  glasses.   Dietary issues and exercise activities discussed: Current Exercise Habits: The patient does not participate in regular exercise at present, Exercise limited by: cardiac  condition(s);neurologic condition(s)   Goals Addressed             This Visit's Progress    Increase physical activity   Not on track    Increase activity, in gym 2 x week.        Depression Screen    05/01/2022    1:38 PM 04/18/2022    3:25 PM 07/25/2021    2:04 PM 07/11/2021    8:59 AM 04/26/2021    2:13 PM 01/31/2021    2:18 PM 11/07/2020    1:39 PM  PHQ 2/9 Scores  PHQ - 2 Score 0 0 0 '4 4 1 2  '$ PHQ- 9 Score   0 '16 15 7 6    '$ Fall Risk    05/01/2022    1:55 PM 05/01/2022    1:38 PM 04/18/2022    3:25 PM 10/03/2021   10:44 AM 07/25/2021    2:04 PM  Fall Risk   Falls in the past year? 0 0 0 1 1  Number falls in past yr: 0 0 0 0 0  Injury with Fall? 0 0 0 0 1  Risk for fall due to : History of fall(s) No Fall Risks  History of fall(s) No Fall Risks  Follow up Falls evaluation completed;Education provided;Falls prevention discussed Falls evaluation completed  Falls evaluation completed Falls evaluation completed    FALL RISK PREVENTION PERTAINING TO THE HOME:  Any stairs in or around the home? No  If so, are there any without handrails? No  Home free of loose throw rugs in walkways, pet beds, electrical cords, etc? Yes  Adequate lighting in your home to reduce risk of falls? Yes   ASSISTIVE DEVICES UTILIZED TO PREVENT FALLS:  Life alert? No  Use of a cane, walker or w/c? No  Grab bars in the bathroom? Yes  Shower chair or bench in shower? No  Elevated toilet seat or a handicapped toilet? No   TIMED UP AND GO:  Was the test  performed? No .  Length of time to ambulate 10 feet: N/A sec.   Gait steady and fast without use of assistive device  Cognitive Function:    05/01/2022    1:40 PM 09/23/2021    2:39 PM 11/21/2020    1:59 PM 01/07/2018    1:44 PM 05/27/2017   11:04 AM  MMSE - Mini Mental State Exam  Orientation to time '4 5 5 5 5  '$ Orientation to Place '5 5 5 5 5  '$ Registration '3 3 3 3 3  '$ Attention/ Calculation '5 4 5 5 2  '$ Recall '2 3 3 2 3  '$ Language- name 2 objects '2  2 2 2 2  '$ Language- repeat '1 1 1 1 1  '$ Language- follow 3 step command '1 3 3 3 2  '$ Language- read & follow direction 0 '1 1 1 1  '$ Write a sentence '1 1 1 1 1  '$ Copy design 0 '1 1 1 1  '$ Total score '24 29 30 29 26        '$ Immunizations Immunization History  Administered Date(s) Administered   Covid-19, Mrna,Vaccine(Spikevax)58yr and older 12/06/2021   Fluad Quad(high Dose 65+) 12/01/2018, 12/06/2021   Influenza Split 11/24/2010, 11/11/2011   Influenza Whole 12/25/2008, 01/15/2010   Influenza, High Dose Seasonal PF 11/19/2015, 12/07/2017   Influenza,inj,Quad PF,6+ Mos 04/04/2014   Influenza-Unspecified 12/26/2014, 12/22/2016   PFIZER(Purple Top)SARS-COV-2 Vaccination 03/15/2019, 04/05/2019, 11/25/2019   Pfizer Covid-19 Vaccine Bivalent Booster 153yr& up 06/04/2021   Pneumococcal Conjugate-13 10/05/2014   Pneumococcal Polysaccharide-23 12/31/2016   Zoster, Live 05/09/2011    TDAP status: Due, Education has been provided regarding the importance of this vaccine. Advised may receive this vaccine at local pharmacy or Health Dept. Aware to provide a copy of the vaccination record if obtained from local pharmacy or Health Dept. Verbalized acceptance and understanding.  Flu Vaccine status: Up to date  Pneumococcal vaccine status: Up to date  Covid-19 vaccine status: Completed vaccines  Qualifies for Shingles Vaccine? Yes   Zostavax completed Yes   Shingrix Completed?: No.    Education has been provided regarding the importance of this vaccine. Patient has been advised to call insurance company to determine out of pocket expense if they have not yet received this vaccine. Advised may also receive vaccine at local pharmacy or Health Dept. Verbalized acceptance and understanding.  Screening Tests Health Maintenance  Topic Date Due   DTaP/Tdap/Td (1 - Tdap) Never done   DEXA SCAN  07/10/2021   MAMMOGRAM  03/20/2023   Medicare Annual Wellness (AWV)  05/01/2023   Pneumonia Vaccine 65+ Years  old  Completed   INFLUENZA VACCINE  Completed   COVID-19 Vaccine  Completed   HPV VACCINES  Aged Out   Zoster Vaccines- Shingrix  Discontinued    Health Maintenance  Health Maintenance Due  Topic Date Due   DTaP/Tdap/Td (1 - Tdap) Never done   DEXA SCAN  07/10/2021    Colorectal cancer screening: No longer required.   Mammogram status: Completed 03/2022. Repeat every year  Bone Density status: Completed 11/2021. Results reflect: Bone density results: OSTEOPENIA. Repeat every 2 years.  Lung Cancer Screening: (Low Dose CT Chest recommended if Age 83-80ears, 30 pack-year currently smoking OR have quit w/in 15years.) does not qualify.   Lung Cancer Screening Referral: No  Additional Screening:  Hepatitis C Screening: does not qualify; Completed   Vision Screening: Recommended annual ophthalmology exams for early detection of glaucoma and other disorders of the eye. Is the patient up  to date with their annual eye exam?  Yes  Who is the provider or what is the name of the office in which the pati ent attends annual eye exams? Dr. Gean Birchwood If pt is not established with a provider, would they like to be referred to a provider to establish care? No .   Dental Screening: Recommended annual dental exams for proper oral hygiene  Community Resource Referral / Chronic Care Management: CRR required this visit?  No   CCM required this visit?  No      Plan:     I have personally reviewed and noted the following in the patient's chart:   Medical and social history Use of alcohol, tobacco or illicit drugs  Current medications and supplements including opioid prescriptions. Patient is not currently taking opioid prescriptions. Functional ability and status Nutritional status Physical activity Advanced directives List of other physicians Hospitalizations, surgeries, and ER visits in previous 12 months Vitals Screenings to include cognitive, depression, and falls Referrals  and appointments  In addition, I have reviewed and discussed with patient certain preventive protocols, quality metrics, and best practice recommendations. A written personalized care plan for preventive services as well as general preventive health recommendations were provided to patient.     Yvonna Alanis, NP   05/01/2022   Nurse Notes: Recommend Tdap vaccine

## 2022-05-02 ENCOUNTER — Ambulatory Visit: Payer: Medicare HMO

## 2022-05-05 ENCOUNTER — Ambulatory Visit: Payer: Medicare HMO

## 2022-05-06 NOTE — Therapy (Signed)
OUTPATIENT PHYSICAL THERAPY NEURO TREATMENT   Patient Name: Paige Pena MRN: [id number] DOB:1940-07-28, 82 y.o., female Today's Date: 05/06/2022   PCP: Windell Moulding, NP REFERRING PROVIDER: Dr. Marcial Pacas  END OF SESSION:      Past Medical History:  Diagnosis Date   Acute cystitis    Anxiety    Aphasia    transsient aphasia Jan 2012   Asthmatic bronchitis    Cerebrovascular disease    Diverticulosis of colon    DJD (degenerative joint disease)    Dysphonia    Esophageal stricture    Fibromyalgia    Gastroparesis    GERD (gastroesophageal reflux disease)    Hematuria, microscopic    Hemorrhoids    Hypercholesteremia    IBS (irritable bowel syndrome)    Memory deficits 02/15/2013   RLS (restless legs syndrome)    TIA (transient ischemic attack)    Urinary incontinence    Vitamin D deficiency    Past Surgical History:  Procedure Laterality Date   ABDOMINAL HYSTERECTOMY  02/24/1993   cataract surgery Right 12/19/2021   corneal transplant for keratonconus     EYE SURGERY  02/25/2011   left foot fracture with ORIF  07/25/2004   Dr. Sharol Given   right bunion surgery     Patient Active Problem List   Diagnosis Date Noted   Pulmonary nodule 09/03/2021   Osteopenia 07/25/2021   Throat irritation 11/28/2019   HLD (hyperlipidemia)    Overactive bladder 05/08/2015   Aphasia    Cervical spondylitis ([place])    Paresthesias 03/21/2015   TIA (transient ischemic attack) 03/21/2015   Physical exam 10/05/2014   Abdominal pain 05/17/2014   Allergic rhinitis 05/17/2014   Fatigue 04/04/2014   Anxiety and depression 07/25/2013   Weight loss 07/25/2013   Memory deficits 02/15/2013   Lumbar spondylosis 09/21/2012   Tachycardia 09/25/2011   Palpitations 09/16/2010   Dysphonia 09/05/2010   Slowing of urinary stream 09/25/2009   Cerebrovascular disease 04/03/2008   DYSPHONIA 04/03/2008   Diverticulosis of large intestine 04/02/2008   Irritable bowel syndrome 04/02/2008    DEGENERATIVE JOINT DISEASE 04/02/2008   HEMATURIA, MICROSCOPIC, HX OF 04/02/2008   Vitamin D deficiency 03/28/2008   Fibromyalgia 03/28/2008   URINARY INCONTINENCE 03/28/2008   GERD 08/02/2007   Gastroparesis 08/02/2007   HEMORRHOIDS 07/30/2007   ESOPHAGEAL STRICTURE 07/30/2007   Anxiety state 07/30/2007    ONSET DATE: 03/27/22  REFERRING DIAG: R26.9 (ICD-10-CM) - Abnormal gait   THERAPY DIAG:  No diagnosis found.  Rationale for Evaluation and Treatment: Rehabilitation  SUBJECTIVE:  SUBJECTIVE STATEMENT: I am present, I lost one of my contacts so it is very frustrating.   Pt accompanied by: self  PERTINENT HISTORY: fibromyalgia, anxiety, TIA x 2 (last one was about 5 years ago); pt reports of having 4 screws in left foot from fractured foot years ago from fall.  PAIN:  Are you having pain? No  PRECAUTIONS: None  WEIGHT BEARING RESTRICTIONS: No  FALLS: Has patient fallen in last 6 months? No  LIVING ENVIRONMENT: Lives with: lives with their spouse Lives in: House/apartment Stairs: Yes: External: 3 steps; can reach both Has following equipment at home: None  PLOF: Independent  PATIENT GOALS: improve balance with walking  OBJECTIVE:   DIAGNOSTIC FINDINGS: 03/26/22 Cervical MRI MRI cervical spine (without) demonstrating: - At C3-4 disc bulging and facet hypertrophy with mild spinal stenosis and severe bilateral foraminal stenosis. - At C6-7 disc bulging with mild spinal stenosis and mild bilateral foraminal stenosis. - At C4-5, C5-6 uncovertebral joint hypertrophy with moderate bilateral foraminal stenosis. -Mild progression of degenerative changes since 2017.  COGNITION: Overall cognitive status: Within functional limits for tasks assessed   LOWER EXTREMITY MMT:    MMT  Right Eval Left Eval  Hip flexion 5 4  Hip extension    Hip abduction 5 5  Hip adduction 5 5  Hip internal rotation    Hip external rotation    Knee flexion 5 5  Knee extension 5 5  Ankle dorsiflexion 5 4  Ankle plantarflexion 5 4  Ankle inversion    Ankle eversion    (Blank rows = not tested)  FUNCTIONAL TESTS:  5 times sit to stand: 13.6 sec without UE support FGA: 27/30 MCTSIB: Condition 1: Avg of 3 trials: 30 sec, Condition 2: Avg of 3 trials: 30 sec, Condition 3: Avg of 3 trials: 30 sec, Condition 4: Avg of 3 trials: 30 sec, and Total Score: 120/120   TODAY'S TREATMENT:                                                                                                                              DATE:  05/07/22 FGA- 27/30 5xSTS- 12.7s NuStep L5 x63mns Standing on airex feet together, then EC Tandem on airex- CGA due to unsteadiness  Lateral band walks green 2x10  Walk outdoors big loop    04/28/22 Step ups on airex on top of 6" step, then lateral step ups  Leg ext 10# 2x10 HS curls 15# 2x10 Walking with direction changes while catching ball  Walking on beam    04/24/22 Bike L2 x675ms  Leg ext 10# 2x10 HS curls 20# 2x10 Resisted side steps 20# 5x each side  Standing on airex feet together, then eyes closed  Side steps on airex  Hitting ball standing on airex  04/23/22 NuStep L5 x5m23m  3 way hip 3# 2x10 STS on airex 2x10 Step ups 6" from airex Leg press 20# 2x10   04/16/22 NuStep L5 x6mi104m  Walking catching ball, direction changes Resisted gait 20# 4 way x4 Standing on airex hitting volleyball Marching on airex   04/10/22 NuStep L5 x60mns Walking on beam Standing on airex feet together, tandem then with eyes closed 3-5s secs at best with eyes closed  Leg ext 10# 2x10 HS curls 20# 2x10  Leg press 20# 2x10 STS on airex 2x10   PATIENT EDUCATION: Education details: Pt educated on evaluation findings, discussed goals Person educated:  Patient Education method: Explanation Education comprehension: verbalized understanding  HOME EXERCISE PROGRAM: TBD  GOALS: Goals reviewed with patient? Yes  SHORT TERM GOALS: Target date: 05/01/2022   Patient will demo 30/30 on FGA to improve dynamic balance with functional mobility Baseline: 27/30 05/07/22 Goal status: IN PROGRESS  2.  Pt will demo <12 seconds with 5x sit to stand to improve functional strength with transfers. Baseline: 13.6 sec (no UE support)(04/03/22), 12.7s 05/07/22 Goal status: IN PROGRESS   LONG TERM GOALS: Target date: 05/15/2022  Pt will be I and compliant with HEP to self manage her symptoms. Baseline: HEP to issued in future sessions. Goal status: INITIAL    ASSESSMENT:  CLINICAL IMPRESSION: Continued to work on LE strength and balance. She is doing well despite thinking she needs to have continued therapy. Told her we will do 2 more visits and d/c with a HEP to follow.   OBJECTIVE IMPAIRMENTS: Abnormal gait, decreased balance, decreased endurance, difficulty walking, decreased strength, increased muscle spasms, postural dysfunction, and pain.   ACTIVITY LIMITATIONS: lifting, sitting, squatting, and transfers  PARTICIPATION LIMITATIONS: cleaning, shopping, and community activity  PERSONAL FACTORS: Age, Past/current experiences, and Time since onset of injury/illness/exacerbation are also affecting patient's functional outcome.   REHAB POTENTIAL: Excellent  CLINICAL DECISION MAKING: Stable/uncomplicated  EVALUATION COMPLEXITY: Low  PLAN:  PT FREQUENCY: 2x/week  PT DURATION: 6 weeks (8 sessions plus eval)  PLANNED INTERVENTIONS: Therapeutic exercises, Therapeutic activity, Neuromuscular re-education, Balance training, Gait training, Patient/Family education, Self Care, Joint mobilization, Joint manipulation, Stair training, Orthotic/Fit training, Spinal mobilization, Cryotherapy, Moist heat, Manual therapy, and Re-evaluation  PLAN FOR NEXT  SESSION: Continue to work on dynamic gait with eyes closed, on non-compliant surfaces, static balance with eyes closed and non compliant surface (work on managing excessive forward sway with eyes closed); functional strength   [employer] PT 05/06/2022, 3:01 PM

## 2022-05-07 ENCOUNTER — Ambulatory Visit: Payer: Medicare HMO

## 2022-05-07 DIAGNOSIS — R269 Unspecified abnormalities of gait and mobility: Secondary | ICD-10-CM | POA: Diagnosis not present

## 2022-05-07 DIAGNOSIS — M6281 Muscle weakness (generalized): Secondary | ICD-10-CM | POA: Diagnosis not present

## 2022-05-08 ENCOUNTER — Ambulatory Visit: Payer: Medicare HMO | Admitting: Adult Health

## 2022-05-08 ENCOUNTER — Ambulatory Visit (INDEPENDENT_AMBULATORY_CARE_PROVIDER_SITE_OTHER): Payer: Medicare HMO | Admitting: Neurology

## 2022-05-08 ENCOUNTER — Telehealth: Payer: Self-pay | Admitting: Neurology

## 2022-05-08 ENCOUNTER — Encounter: Payer: Self-pay | Admitting: Neurology

## 2022-05-08 VITALS — BP 131/63 | HR 83 | Ht 61.0 in | Wt 118.5 lb

## 2022-05-08 DIAGNOSIS — M4692 Unspecified inflammatory spondylopathy, cervical region: Secondary | ICD-10-CM | POA: Diagnosis not present

## 2022-05-08 DIAGNOSIS — R202 Paresthesia of skin: Secondary | ICD-10-CM

## 2022-05-08 DIAGNOSIS — R4189 Other symptoms and signs involving cognitive functions and awareness: Secondary | ICD-10-CM

## 2022-05-08 NOTE — Progress Notes (Signed)
Chief Complaint  Patient presents with   Follow-up    Rm 14. Patient alone. Patient states she has good days and bad days.       ASSESSMENT AND PLAN  Paige Pena is a 82 y.o. female   Paresthesia Cervical spondylosis  EMG nerve conduction study to rule out focal neuropathy   Cognitive Impairment  MOCA 21/30  Strong family history of dementia  MRI of brain   DIAGNOSTIC DATA (LABS, IMAGING, TESTING) - I reviewed patient records, labs, notes, testing and imaging myself where available.   MEDICAL HISTORY:  Paige Pena is a 82 year old female, seen in request by [facility] primary care nurse practitioner Cleophas Dunker, Amy E, for evaluation of gait abnormality, numbness at both hands and feet, initial evaluation January 28, 2022   I reviewed and summarized the referring note.PMHX HLD GERD   Since beginning of 2023 she noticed numbness tingling in bilateral feet and hands, gradually getting worse, also noticed mild unsteady gait  This happened following she fell off the ladder earlier, she was stepping up on a ladder of 5 of 6 feet, wiping her mirror,  missing steps going down, fell backwards,  She denies bowel or bladder incontinence,  Reviewed CT chest in July 2023 for evaluation of lung nodule, there is evidence of multilevel cervical degenerative changes  UPDATE May 08 2022: She retired as Administrator, used to enjoy playing piano, reading, now due to vision issues, less active, complains of intermittent left hand foot numbness, no pain, uncomfortable, left leg draggy when walking, mild neck discomfort, urinary urgency, wear pad.  Personally reviewed MRI of cervical on Mar 26 2022, level degenerative changes, most obvious at C3-4, facet hypertrophy mild canal stenosis severe biforaminal stenosis, C6-7, mild canal stenosis, variable degree of foraminal narrowing and  She also complains of brain fog, examination 21/30 today, strong family history of dementia,  mother, maternal uncle, aunt, grandmother, suffered memory loss  Laboratory evaluation in June [id number], normal [id number], folic acid, CBC,Hg [id number] vitamin D, TSH, CMP, elevated LDL 137 total cholesterol 228, ferritin 12.  PHYSICAL EXAM:   Vitals:   05/08/22 1307  BP: 131/63  Pulse: 83  Weight: 118 lb 8 oz (53.8 kg)  Height: '5\' 1"'$  (1.549 m)   Body mass index is 22.39 kg/m.  PHYSICAL EXAMNIATION:  Gen: NAD, conversant, well nourised, well groomed                     Cardiovascular: Regular rate rhythm, no peripheral edema, warm, nontender. Eyes: Conjunctivae clear without exudates or hemorrhage Neck: Supple, no carotid bruits. Pulmonary: Clear to auscultation bilaterally   NEUROLOGICAL EXAM:  MENTAL STATUS: Speech/cognition: Awake, alert, oriented to history taking and casual conversation    05/08/2022    1:00 PM  Montreal Cognitive Assessment   Visuospatial/ Executive (0/5) 3  Naming (0/3) 3  Attention: Read list of digits (0/2) 2  Attention: Read list of letters (0/1) 1  Attention: Serial 7 subtraction starting at 100 (0/3) 0  Language: Repeat phrase (0/2) 2  Language : Fluency (0/1) 0  Abstraction (0/2) 2  Delayed Recall (0/5) 2  Orientation (0/6) 6  Total 21    CRANIAL NERVES: CN II: Visual fields are full to confrontation. Left pupil is round equal and briskly reactive to light. Right pupil post surgical changes, opaque, OD 20/400 CN III, IV, VI: extraocular movement are normal. No ptosis. CN V: Facial sensation is intact to light touch CN VII: Face  is symmetric with normal eye closure  CN VIII: Hearing is normal to causal conversation. CN IX, X: Phonation is normal. CN XI: Head turning and shoulder shrug are intact  MOTOR: There is no pronator drift of out-stretched arms. Muscle bulk and tone are normal. Muscle strength is normal.  REFLEXES: Reflexes are 3  and symmetric at the biceps, triceps, knees, and ankles. Plantar responses are extensor  bilaterally  SENSORY: Intact to light touch, pinprick and vibratory sensation are intact in fingers and toes.  COORDINATION: There is no trunk or limb dysmetria noted.  GAIT/STANCE:Can getup from seated position arm crossed, mild stiff, still stable.  REVIEW OF SYSTEMS:  Full 14 system review of systems performed and notable only for as above All other review of systems were negative.   ALLERGIES: Allergies  Allergen Reactions   Morphine Other (See Comments)    REACTION: lethargy/malaise   Metoclopramide Hcl Anxiety and Other (See Comments)    REACTION: anxious and out of her mind   Aricept [Donepezil Hcl]     nausea   Doxycycline Diarrhea   Penicillins Itching and Rash    Has patient had a PCN reaction causing immediate rash, facial/tongue/throat swelling, SOB or lightheadedness with hypotension: No Has patient had a PCN reaction causing severe rash involving mucus membranes or skin necrosis: No Has patient had a PCN reaction that required hospitalization No Has patient had a PCN reaction occurring within the last 10 years: Yes If all of the above answers are "NO", then may proceed with Cephalosporin use.     Prednisone Itching and Rash   Sulfonamide Derivatives Itching and Rash    HOME MEDICATIONS: Current Outpatient Medications  Medication Sig Dispense Refill   aspirin 325 MG EC tablet Take 325 mg by mouth daily.     atorvastatin (LIPITOR) 20 MG tablet Take 1 tablet (20 mg total) by mouth every Monday, Wednesday, and Friday. 30 tablet 3   Biotin 1000 MCG tablet Take 1,000 mcg by mouth 3 (three) times daily.     Cholecalciferol (VITAMIN D) 50 MCG (2000 UT) CAPS Take by mouth.     COLLAGEN-VITAMIN C PO Take by mouth.     cyanocobalamin (VITAMIN B12) 1000 MCG tablet Take 1 tablet (1,000 mcg total) by mouth daily. 30 tablet 3   DULoxetine (CYMBALTA) 60 MG capsule Take 1 capsule (60 mg total) by mouth daily. 90 capsule 1   fluticasone (FLONASE) 50 MCG/ACT nasal spray  Place 2 sprays into both nostrils daily as needed for allergies. 16 g 3   hydrOXYzine (VISTARIL) 25 MG capsule Take 1 capsule (25 mg total) by mouth daily as needed. For panic attack or increased anxiety 30 capsule 0   Omega-3 Fatty Acids (FISH OIL) 1000 MG CAPS Take by mouth.     omeprazole (PRILOSEC) 40 MG capsule TAKE 1 CAPSULE TWICE DAILY 180 capsule 1   prednisoLONE acetate (PRED FORTE) 1 % ophthalmic suspension Place 1 drop into both eyes as directed.     No current facility-administered medications for this visit.    PAST MEDICAL HISTORY: Past Medical History:  Diagnosis Date   Acute cystitis    Anxiety    Aphasia    transsient aphasia Jan 2012   Asthmatic bronchitis    Cerebrovascular disease    Diverticulosis of colon    DJD (degenerative joint disease)    Dysphonia    Esophageal stricture    Fibromyalgia    Gastroparesis    GERD (gastroesophageal reflux disease)  Hematuria, microscopic    Hemorrhoids    Hypercholesteremia    IBS (irritable bowel syndrome)    Memory deficits 02/15/2013   RLS (restless legs syndrome)    TIA (transient ischemic attack)    Urinary incontinence    Vitamin D deficiency     PAST SURGICAL HISTORY: Past Surgical History:  Procedure Laterality Date   ABDOMINAL HYSTERECTOMY  02/24/1993   cataract surgery Right 12/19/2021   corneal transplant for keratonconus     EYE SURGERY  02/25/2011   left foot fracture with ORIF  07/25/2004   Dr. Sharol Given   right bunion surgery      FAMILY HISTORY: Family History  Problem Relation Age of Onset   Hypertension Mother    Anemia Mother    Dementia Mother    Kidney disease Father    Hyperlipidemia Sister    Diabetes Brother    Lung cancer Brother    Dementia Maternal Grandmother    Diabetes Other        aunt,brother,uncle   Breast cancer Maternal Aunt     SOCIAL HISTORY: Social History   Socioeconomic History   Marital status: Married    Spouse name: Mallie Mussel   Number of children: 3    Years of education: college-2   Highest education level: Not on file  Occupational History    Employer: RETIRED  Tobacco Use   Smoking status: Never   Smokeless tobacco: Never  Vaping Use   Vaping Use: Never used  Substance and Sexual Activity   Alcohol use: Yes    Comment: occ   Drug use: Never   Sexual activity: Not on file  Other Topics Concern   Not on file  Social History Narrative   Lennie Hummer   Mathis, daughter   Willa Saddler info release form to share her medical records with her children   Lives at home w/ her husband   Right-handed   Caffeine: tea on occasion   Social Determinants of Health   Financial Resource Strain: Low Risk  (05/01/2022)   Overall Financial Resource Strain (CARDIA)    Difficulty of Paying Living Expenses: Not hard at all  Food Insecurity: No Food Insecurity (05/01/2022)   Hunger Vital Sign    Worried About Running Out of Food in the Last Year: Never true    [place] in the Last Year: Never true  Transportation Needs: No Transportation Needs (05/01/2022)   PRAPARE - Hydrologist (Medical): No    Lack of Transportation (Non-Medical): No  Physical Activity: Sufficiently Active (05/01/2022)   Exercise Vital Sign    Days of Exercise per Week: 3 days    Minutes of Exercise per Session: 50 min  Stress: Stress Concern Present (05/01/2022)   [place]    Feeling of Stress : To some extent  Social Connections: Moderately Integrated (05/01/2022)   Social Connection and Isolation Panel [NHANES]    Frequency of Communication with Friends and Family: More than three times a week    Frequency of Social Gatherings with Friends and Family: Once a week    Attends Religious Services: More than 4 times per year    Active Member of [employer] or Organizations: No    Attends Archivist Meetings: Never    Marital Status:  Married  Human resources officer Violence: Not At Risk (05/01/2022)   Humiliation, Afraid, Rape, and Kick questionnaire    Fear of Current  or Ex-Partner: No    Emotionally Abused: No    Physically Abused: No    Sexually Abused: No      Marcial Pacas, M.D. Ph.D.  [facility] [address] [place], [place] [address] Ph: [phone] Fax: [phone]  CC:  Yvonna Alanis, NP [address],  [place] [address]  Yvonna Alanis, NP

## 2022-05-08 NOTE — Telephone Encounter (Signed)
Spoke with pt, pt stated that she will have to wait to schedule EMG until after her daughter gets back into town to see what her schedule is. I informed pt that Dr. Krista Blue is currently booking out to 07/09/22 and to call us back when she was ready to schedule

## 2022-05-08 NOTE — Progress Notes (Signed)
Patient cancelled [facility] Appointment

## 2022-05-12 ENCOUNTER — Telehealth: Payer: Self-pay | Admitting: Neurology

## 2022-05-12 NOTE — Therapy (Signed)
OUTPATIENT PHYSICAL THERAPY NEURO TREATMENT   Patient Name: Paige Pena MRN: [id number] DOB:09/08/1940, 82 y.o., female Today's Date: 05/13/2022   PCP: Windell Moulding, NP REFERRING PROVIDER: Dr. Marcial Pacas  END OF SESSION:  PT End of Session - 05/13/22 1329     Visit Number 8    Number of Visits 9    Date for PT Re-Evaluation 05/15/22    Authorization Type [employer]    Authorization - Number of Visits 8    PT Start Time 1330    PT Stop Time 1400    PT Time Calculation (min) 30 min    Activity Tolerance Patient tolerated treatment well    Behavior During Therapy WFL for tasks assessed/performed                Past Medical History:  Diagnosis Date   Acute cystitis    Anxiety    Aphasia    transsient aphasia Jan 2012   Asthmatic bronchitis    Cerebrovascular disease    Diverticulosis of colon    DJD (degenerative joint disease)    Dysphonia    Esophageal stricture    Fibromyalgia    Gastroparesis    GERD (gastroesophageal reflux disease)    Hematuria, microscopic    Hemorrhoids    Hypercholesteremia    IBS (irritable bowel syndrome)    Memory deficits 02/15/2013   RLS (restless legs syndrome)    TIA (transient ischemic attack)    Urinary incontinence    Vitamin D deficiency    Past Surgical History:  Procedure Laterality Date   ABDOMINAL HYSTERECTOMY  02/24/1993   cataract surgery Right 12/19/2021   corneal transplant for keratonconus     EYE SURGERY  02/25/2011   left foot fracture with ORIF  07/25/2004   Dr. Sharol Given   right bunion surgery     Patient Active Problem List   Diagnosis Date Noted   Pulmonary nodule 09/03/2021   Osteopenia 07/25/2021   Throat irritation 11/28/2019   HLD (hyperlipidemia)    Overactive bladder 05/08/2015   Aphasia    Cervical spondylitis ([place])    Paresthesias 03/21/2015   TIA (transient ischemic attack) 03/21/2015   Physical exam 10/05/2014   Abdominal pain 05/17/2014   Allergic rhinitis 05/17/2014   Fatigue  04/04/2014   Anxiety and depression 07/25/2013   Weight loss 07/25/2013   Cognitive impairment 02/15/2013   Lumbar spondylosis 09/21/2012   Tachycardia 09/25/2011   Palpitations 09/16/2010   Dysphonia 09/05/2010   Slowing of urinary stream 09/25/2009   Cerebrovascular disease 04/03/2008   DYSPHONIA 04/03/2008   Diverticulosis of large intestine 04/02/2008   Irritable bowel syndrome 04/02/2008   DEGENERATIVE JOINT DISEASE 04/02/2008   HEMATURIA, MICROSCOPIC, HX OF 04/02/2008   Vitamin D deficiency 03/28/2008   Fibromyalgia 03/28/2008   URINARY INCONTINENCE 03/28/2008   GERD 08/02/2007   Gastroparesis 08/02/2007   HEMORRHOIDS 07/30/2007   ESOPHAGEAL STRICTURE 07/30/2007   Anxiety state 07/30/2007    ONSET DATE: 03/27/22  REFERRING DIAG: R26.9 (ICD-10-CM) - Abnormal gait   THERAPY DIAG:  Muscle weakness (generalized)  Abnormal gait  Rationale for Evaluation and Treatment: Rehabilitation  SUBJECTIVE:  SUBJECTIVE STATEMENT: I am present, still can't see because of my eye.   Pt accompanied by: self  PERTINENT HISTORY: fibromyalgia, anxiety, TIA x 2 (last one was about 5 years ago); pt reports of having 4 screws in left foot from fractured foot years ago from fall.  PAIN:  Are you having pain? No  PRECAUTIONS: None  WEIGHT BEARING RESTRICTIONS: No  FALLS: Has patient fallen in last 6 months? No  LIVING ENVIRONMENT: Lives with: lives with their spouse Lives in: House/apartment Stairs: Yes: External: 3 steps; can reach both Has following equipment at home: None  PLOF: Independent  PATIENT GOALS: improve balance with walking  OBJECTIVE:   DIAGNOSTIC FINDINGS: 03/26/22 Cervical MRI MRI cervical spine (without) demonstrating: - At C3-4 disc bulging and facet hypertrophy with mild  spinal stenosis and severe bilateral foraminal stenosis. - At C6-7 disc bulging with mild spinal stenosis and mild bilateral foraminal stenosis. - At C4-5, C5-6 uncovertebral joint hypertrophy with moderate bilateral foraminal stenosis. -Mild progression of degenerative changes since 2017.  COGNITION: Overall cognitive status: Within functional limits for tasks assessed   LOWER EXTREMITY MMT:    MMT Right Eval Left Eval  Hip flexion 5 4  Hip extension    Hip abduction 5 5  Hip adduction 5 5  Hip internal rotation    Hip external rotation    Knee flexion 5 5  Knee extension 5 5  Ankle dorsiflexion 5 4  Ankle plantarflexion 5 4  Ankle inversion    Ankle eversion    (Blank rows = not tested)  FUNCTIONAL TESTS:  5 times sit to stand: 13.6 sec without UE support FGA: 27/30 MCTSIB: Condition 1: Avg of 3 trials: 30 sec, Condition 2: Avg of 3 trials: 30 sec, Condition 3: Avg of 3 trials: 30 sec, Condition 4: Avg of 3 trials: 30 sec, and Total Score: 120/120   TODAY'S TREATMENT:                                                                                                                              DATE:  05/13/22 STS on airex 2x10  Tandem walking on firm  Walking on beam  SLS 10s bilaterally  SLS on airex 5s bilaterally Eyes closed walking forwards and backwards Step ups from airex 6"   05/07/22 FGA- 27/30 5xSTS- 12.7s NuStep L5 x30mins Standing on airex feet together, then EC Tandem on airex- CGA due to unsteadiness  Lateral band walks green 2x10  Walk outdoors big loop    04/28/22 Step ups on airex on top of 6" step, then lateral step ups  Leg ext 10# 2x10 HS curls 15# 2x10 Walking with direction changes while catching ball  Walking on beam    04/24/22 Bike L2 x60mins  Leg ext 10# 2x10 HS curls 20# 2x10 Resisted side steps 20# 5x each side  Standing on airex feet together, then eyes closed  Side steps on airex  Hitting ball standing  on  airex  04/23/22 NuStep L5 x53mins  3 way hip 3# 2x10 STS on airex 2x10 Step ups 6" from airex Leg press 20# 2x10   04/16/22 NuStep L5 x59mins  Walking catching ball, direction changes Resisted gait 20# 4 way x4 Standing on airex hitting volleyball Marching on airex   04/10/22 NuStep L5 x41mins Walking on beam Standing on airex feet together, tandem then with eyes closed 3-5s secs at best with eyes closed  Leg ext 10# 2x10 HS curls 20# 2x10  Leg press 20# 2x10 STS on airex 2x10   PATIENT EDUCATION: Education details: Pt educated on evaluation findings, discussed goals Person educated: Patient Education method: Explanation Education comprehension: verbalized understanding  HOME EXERCISE PROGRAM: TBD  GOALS: Goals reviewed with patient? Yes  SHORT TERM GOALS: Target date: 05/01/2022   Patient will demo 30/30 on FGA to improve dynamic balance with functional mobility Baseline: 27/30 05/07/22 Goal status: IN PROGRESS  2.  Pt will demo <12 seconds with 5x sit to stand to improve functional strength with transfers. Baseline: 13.6 sec (no UE support)(04/03/22), 12.7s 05/07/22 Goal status: IN PROGRESS   LONG TERM GOALS: Target date: 06/13/2022  Pt will be I and compliant with HEP to self manage her symptoms. Baseline: HEP to issued in future sessions. Goal status: IN PROGRESS  2. Patient will be able to take 5 consecutive tandem steps   Baseline: 2 steps at best  Goal status: INITIAL    ASSESSMENT:  CLINICAL IMPRESSION: Continued to work on LE strength and balance. She is doing well despite thinking she needs to have continued therapy. Recerted for 4 more visits to continue to work on her goals. Added in new tandem walking goal.    OBJECTIVE IMPAIRMENTS: Abnormal gait, decreased balance, decreased endurance, difficulty walking, decreased strength, increased muscle spasms, postural dysfunction, and pain.   ACTIVITY LIMITATIONS: lifting, sitting, squatting, and  transfers  PARTICIPATION LIMITATIONS: cleaning, shopping, and community activity  PERSONAL FACTORS: Age, Past/current experiences, and Time since onset of injury/illness/exacerbation are also affecting patient's functional outcome.   REHAB POTENTIAL: Excellent  CLINICAL DECISION MAKING: Stable/uncomplicated  EVALUATION COMPLEXITY: Low  PLAN:  PT FREQUENCY: 2x/week  PT DURATION: 6 weeks (8 sessions plus eval)  PLANNED INTERVENTIONS: Therapeutic exercises, Therapeutic activity, Neuromuscular re-education, Balance training, Gait training, Patient/Family education, Self Care, Joint mobilization, Joint manipulation, Stair training, Orthotic/Fit training, Spinal mobilization, Cryotherapy, Moist heat, Manual therapy, and Re-evaluation  PLAN FOR NEXT SESSION: Continue to work on dynamic gait with eyes closed, on non-compliant surfaces, static balance with eyes closed and non compliant surface functional strength   [employer], PT 05/13/2022, 2:00 PM

## 2022-05-12 NOTE — Telephone Encounter (Signed)
Mcarthur Rossetti Josem Kaufmann: [id number] exp. 05/12/22-06/11/22 sent to GI [phone]

## 2022-05-13 ENCOUNTER — Ambulatory Visit: Payer: Medicare HMO

## 2022-05-13 DIAGNOSIS — M6281 Muscle weakness (generalized): Secondary | ICD-10-CM

## 2022-05-13 DIAGNOSIS — R269 Unspecified abnormalities of gait and mobility: Secondary | ICD-10-CM

## 2022-05-14 DIAGNOSIS — Z947 Corneal transplant status: Secondary | ICD-10-CM | POA: Diagnosis not present

## 2022-05-14 DIAGNOSIS — H2512 Age-related nuclear cataract, left eye: Secondary | ICD-10-CM | POA: Diagnosis not present

## 2022-05-14 DIAGNOSIS — Z961 Presence of intraocular lens: Secondary | ICD-10-CM | POA: Diagnosis not present

## 2022-05-15 ENCOUNTER — Encounter: Payer: Medicare HMO | Admitting: Orthopedic Surgery

## 2022-05-16 NOTE — Progress Notes (Signed)
This encounter was created in error - please disregard.

## 2022-05-21 ENCOUNTER — Ambulatory Visit: Payer: Medicare HMO | Admitting: Physical Therapy

## 2022-05-21 ENCOUNTER — Encounter: Payer: Self-pay | Admitting: Orthopedic Surgery

## 2022-05-22 ENCOUNTER — Encounter: Payer: Medicare HMO | Admitting: Orthopedic Surgery

## 2022-05-22 ENCOUNTER — Other Ambulatory Visit: Payer: Medicare HMO

## 2022-05-22 DIAGNOSIS — E785 Hyperlipidemia, unspecified: Secondary | ICD-10-CM | POA: Diagnosis not present

## 2022-05-22 DIAGNOSIS — M797 Fibromyalgia: Secondary | ICD-10-CM

## 2022-05-22 DIAGNOSIS — F419 Anxiety disorder, unspecified: Secondary | ICD-10-CM | POA: Diagnosis not present

## 2022-05-22 DIAGNOSIS — K219 Gastro-esophageal reflux disease without esophagitis: Secondary | ICD-10-CM | POA: Diagnosis not present

## 2022-05-22 DIAGNOSIS — F32A Depression, unspecified: Secondary | ICD-10-CM | POA: Diagnosis not present

## 2022-05-23 LAB — CBC WITH DIFFERENTIAL/PLATELET
Absolute Monocytes: 385 cells/uL (ref 200–950)
Basophils Absolute: 21 cells/uL (ref 0–200)
Basophils Relative: 0.6 %
Eosinophils Absolute: 140 cells/uL (ref 15–500)
Eosinophils Relative: 4 %
HCT: 38.8 % (ref 35.0–45.0)
Hemoglobin: 12.9 g/dL (ref 11.7–15.5)
Lymphs Abs: 1572 cells/uL (ref 850–3900)
MCH: 30.1 pg (ref 27.0–33.0)
MCHC: 33.2 g/dL (ref 32.0–36.0)
MCV: 90.4 fL (ref 80.0–100.0)
MPV: 12.9 fL — ABNORMAL HIGH (ref 7.5–12.5)
Monocytes Relative: 11 %
Neutro Abs: 1383 cells/uL — ABNORMAL LOW (ref 1500–7800)
Neutrophils Relative %: 39.5 %
Platelets: 171 10*3/uL (ref 140–400)
RBC: 4.29 10*6/uL (ref 3.80–5.10)
RDW: 12.9 % (ref 11.0–15.0)
Total Lymphocyte: 44.9 %
WBC: 3.5 10*3/uL — ABNORMAL LOW (ref 3.8–10.8)

## 2022-05-23 LAB — LIPID PANEL
Cholesterol: 168 mg/dL (ref <200)
HDL: 89 mg/dL (ref >=50)
LDL Cholesterol (Calc): 63 mg/dL (calc)
Non-HDL Cholesterol (Calc): 79 mg/dL (calc) (ref <130)
Total CHOL/HDL Ratio: 1.9 (calc) (ref <5.0)
Triglycerides: 77 mg/dL (ref <150)

## 2022-05-23 LAB — COMPLETE METABOLIC PANEL WITH GFR
AG Ratio: 1.5 (calc) (ref 1.0–2.5)
ALT: 14 U/L (ref 6–29)
AST: 20 U/L (ref 10–35)
Albumin: 4 g/dL (ref 3.6–5.1)
Alkaline phosphatase (APISO): 76 U/L (ref 37–153)
BUN: 13 mg/dL (ref 7–25)
CO2: 30 mmol/L (ref 20–32)
Calcium: 9.2 mg/dL (ref 8.6–10.4)
Chloride: 104 mmol/L (ref 98–110)
Creat: 0.9 mg/dL (ref 0.60–0.95)
Globulin: 2.7 g/dL (calc) (ref 1.9–3.7)
Glucose, Bld: 103 mg/dL — ABNORMAL HIGH (ref 65–99)
Potassium: 4 mmol/L (ref 3.5–5.3)
Sodium: 143 mmol/L (ref 135–146)
Total Bilirubin: 0.4 mg/dL (ref 0.2–1.2)
Total Protein: 6.7 g/dL (ref 6.1–8.1)
eGFR: 64 mL/min/{1.73_m2} (ref >=60)

## 2022-05-26 NOTE — Progress Notes (Signed)
This encounter was created in error - please disregard.

## 2022-05-26 NOTE — Therapy (Signed)
OUTPATIENT PHYSICAL THERAPY NEURO TREATMENT   Patient Name: Paige Pena MRN: [id number] DOB:[birth year], 82 y.o., female Today's Date: 05/27/2022   PCP: Windell Moulding, NP REFERRING PROVIDER: Dr. Marcial Pacas  END OF SESSION:  PT End of Session - 05/27/22 1248     Visit Number 9    Date for PT Re-Evaluation 06/13/22    Authorization Type [employer]    Authorization - Visit Number 2    Authorization - Number of Visits 4    PT Start Time [id number]    PT Stop Time [id number]    PT Time Calculation (min) 30 min    Activity Tolerance Patient tolerated treatment well    Behavior During Therapy [facility] for tasks assessed/performed                 Past Medical History:  Diagnosis Date   Acute cystitis    Anxiety    Aphasia    transsient aphasia Jan 2012   Asthmatic bronchitis    Cerebrovascular disease    Diverticulosis of colon    DJD (degenerative joint disease)    Dysphonia    Esophageal stricture    Fibromyalgia    Gastroparesis    GERD (gastroesophageal reflux disease)    Hematuria, microscopic    Hemorrhoids    Hypercholesteremia    IBS (irritable bowel syndrome)    Memory deficits 02/15/2013   RLS (restless legs syndrome)    TIA (transient ischemic attack)    Urinary incontinence    Vitamin D deficiency    Past Surgical History:  Procedure Laterality Date   ABDOMINAL HYSTERECTOMY  02/24/1993   cataract surgery Right 12/19/2021   corneal transplant for keratonconus     EYE SURGERY  02/25/2011   left foot fracture with ORIF  07/25/2004   Dr. Sharol Given   right bunion surgery     Patient Active Problem List   Diagnosis Date Noted   Pulmonary nodule 09/03/2021   Osteopenia 07/25/2021   Throat irritation 11/28/2019   HLD (hyperlipidemia)    Overactive bladder 05/08/2015   Aphasia    Cervical spondylitis    Paresthesias 03/21/2015   TIA (transient ischemic attack) 03/21/2015   Physical exam 10/05/2014   Abdominal pain 05/17/2014   Allergic rhinitis 05/17/2014   Fatigue  04/04/2014   Anxiety and depression 07/25/2013   Weight loss 07/25/2013   Cognitive impairment 02/15/2013   Lumbar spondylosis 09/21/2012   Tachycardia 09/25/2011   Palpitations 09/16/2010   Dysphonia 09/05/2010   Slowing of urinary stream 09/25/2009   Cerebrovascular disease 04/03/2008   DYSPHONIA 04/03/2008   Diverticulosis of large intestine 04/02/2008   Irritable bowel syndrome 04/02/2008   DEGENERATIVE JOINT DISEASE 04/02/2008   HEMATURIA, MICROSCOPIC, HX OF 04/02/2008   Vitamin D deficiency 03/28/2008   Fibromyalgia 03/28/2008   URINARY INCONTINENCE 03/28/2008   GERD 08/02/2007   Gastroparesis 08/02/2007   HEMORRHOIDS 07/30/2007   ESOPHAGEAL STRICTURE 07/30/2007   Anxiety state 07/30/2007    ONSET DATE: 03/27/22  REFERRING DIAG: R26.9 (ICD-10-CM) - Abnormal gait   THERAPY DIAG:  Muscle weakness (generalized)  Abnormal gait  Rationale for Evaluation and Treatment: Rehabilitation  SUBJECTIVE:  SUBJECTIVE STATEMENT: I had a birthday yesterday, I am doing well.   Pt accompanied by: self  PERTINENT HISTORY: fibromyalgia, anxiety, TIA x 2 (last one was about 5 years ago); pt reports of having 4 screws in left foot from fractured foot years ago from fall.  PAIN:  Are you having pain? No  PRECAUTIONS: None  WEIGHT BEARING RESTRICTIONS: No  FALLS: Has patient fallen in last 6 months? No  LIVING ENVIRONMENT: Lives with: lives with their spouse Lives in: House/apartment Stairs: Yes: External: 3 steps; can reach both Has following equipment at home: None  PLOF: Independent  PATIENT GOALS: improve balance with walking  OBJECTIVE:   DIAGNOSTIC FINDINGS: 03/26/22 Cervical MRI MRI cervical spine (without) demonstrating: - At C3-4 disc bulging and facet hypertrophy with mild  spinal stenosis and severe bilateral foraminal stenosis. - At C6-7 disc bulging with mild spinal stenosis and mild bilateral foraminal stenosis. - At C4-5, C5-6 uncovertebral joint hypertrophy with moderate bilateral foraminal stenosis. -Mild progression of degenerative changes since 2017.  COGNITION: Overall cognitive status: Within functional limits for tasks assessed   LOWER EXTREMITY MMT:    MMT Right Eval Left Eval  Hip flexion 5 4  Hip extension    Hip abduction 5 5  Hip adduction 5 5  Hip internal rotation    Hip external rotation    Knee flexion 5 5  Knee extension 5 5  Ankle dorsiflexion 5 4  Ankle plantarflexion 5 4  Ankle inversion    Ankle eversion    (Blank rows = not tested)  FUNCTIONAL TESTS:  5 times sit to stand: 13.6 sec without UE support FGA: 27/30 MCTSIB: Condition 1: Avg of 3 trials: 30 sec, Condition 2: Avg of 3 trials: 30 sec, Condition 3: Avg of 3 trials: 30 sec, Condition 4: Avg of 3 trials: 30 sec, and Total Score: 120/120   TODAY'S TREATMENT:                                                                                                                              DATE:  05/27/22 NuStep L5 x47mins  Resisted gait 20# 4x4 way Calf raises 2x12 Side steps on airex  Leg press 30# 2x10 Walking on beam   05/13/22 STS on airex 2x10  Tandem walking on firm  Walking on beam  SLS 10s bilaterally  SLS on airex 5s bilaterally Eyes closed walking forwards and backwards Step ups from airex 6"   05/07/22 FGA- 27/30 5xSTS- 12.7s NuStep L5 x2mins Standing on airex feet together, then EC Tandem on airex- CGA due to unsteadiness  Lateral band walks green 2x10  Walk outdoors big loop    04/28/22 Step ups on airex on top of 6" step, then lateral step ups  Leg ext 10# 2x10 HS curls 15# 2x10 Walking with direction changes while catching ball  Walking on beam    04/24/22 Bike L2 x72mins  Leg ext 10# 2x10 HS curls 20#  2x10 Resisted side steps 20#  5x each side  Standing on airex feet together, then eyes closed  Side steps on airex  Hitting ball standing on airex  04/23/22 NuStep L5 x24mins  3 way hip 3# 2x10 STS on airex 2x10 Step ups 6" from airex Leg press 20# 2x10   04/16/22 NuStep L5 x12mins  Walking catching ball, direction changes Resisted gait 20# 4 way x4 Standing on airex hitting volleyball Marching on airex   04/10/22 NuStep L5 x32mins Walking on beam Standing on airex feet together, tandem then with eyes closed 3-5s secs at best with eyes closed  Leg ext 10# 2x10 HS curls 20# 2x10  Leg press 20# 2x10 STS on airex 2x10   PATIENT EDUCATION: Education details: Pt educated on evaluation findings, discussed goals Person educated: Patient Education method: Explanation Education comprehension: verbalized understanding  HOME EXERCISE PROGRAM: TBD  GOALS: Goals reviewed with patient? Yes  SHORT TERM GOALS: Target date: 05/01/2022   Patient will demo 30/30 on FGA to improve dynamic balance with functional mobility Baseline: 27/30 05/07/22 Goal status: IN PROGRESS  2.  Pt will demo <12 seconds with 5x sit to stand to improve functional strength with transfers. Baseline: 13.6 sec (no UE support)(04/03/22), 12.7s 05/07/22 Goal status: IN PROGRESS   LONG TERM GOALS: Target date: 06/13/2022  Pt will be I and compliant with HEP to self manage her symptoms. Baseline: HEP to issued in future sessions. Goal status: IN PROGRESS  2. Patient will be able to take 5 consecutive tandem steps   Baseline: 2 steps at best  Goal status: INITIAL    ASSESSMENT:  CLINICAL IMPRESSION: Patient arrives 15 mins late. Continued to work on LE strength and balance. Slow with resisted gait but steady and no LOB.   OBJECTIVE IMPAIRMENTS: Abnormal gait, decreased balance, decreased endurance, difficulty walking, decreased strength, increased muscle spasms, postural dysfunction, and pain.   ACTIVITY LIMITATIONS: lifting, sitting,  squatting, and transfers  PARTICIPATION LIMITATIONS: cleaning, shopping, and community activity  PERSONAL FACTORS: Age, Past/current experiences, and Time since onset of injury/illness/exacerbation are also affecting patient's functional outcome.   REHAB POTENTIAL: Excellent  CLINICAL DECISION MAKING: Stable/uncomplicated  EVALUATION COMPLEXITY: Low  PLAN:  PT FREQUENCY: 2x/week  PT DURATION: 6 weeks (8 sessions plus eval)  PLANNED INTERVENTIONS: Therapeutic exercises, Therapeutic activity, Neuromuscular re-education, Balance training, Gait training, Patient/Family education, Self Care, Joint mobilization, Joint manipulation, Stair training, Orthotic/Fit training, Spinal mobilization, Cryotherapy, Moist heat, Manual therapy, and Re-evaluation  PLAN FOR NEXT SESSION: Continue to work on dynamic gait with eyes closed, on non-compliant surfaces, static balance with eyes closed and non compliant surface functional strength   [employer], PT 05/27/2022, 1:15 PM

## 2022-05-27 ENCOUNTER — Ambulatory Visit: Payer: Medicare HMO | Attending: Neurology

## 2022-05-27 DIAGNOSIS — R269 Unspecified abnormalities of gait and mobility: Secondary | ICD-10-CM | POA: Diagnosis not present

## 2022-05-27 DIAGNOSIS — M6281 Muscle weakness (generalized): Secondary | ICD-10-CM | POA: Diagnosis not present

## 2022-05-29 ENCOUNTER — Encounter: Payer: Self-pay | Admitting: Orthopedic Surgery

## 2022-05-29 ENCOUNTER — Ambulatory Visit: Payer: Medicare HMO

## 2022-05-29 ENCOUNTER — Ambulatory Visit (INDEPENDENT_AMBULATORY_CARE_PROVIDER_SITE_OTHER): Payer: Medicare HMO | Admitting: Orthopedic Surgery

## 2022-05-29 VITALS — BP 138/74 | HR 76 | Temp 97.4°F | Resp 16 | Ht 61.0 in | Wt 118.6 lb

## 2022-05-29 DIAGNOSIS — I10 Essential (primary) hypertension: Secondary | ICD-10-CM | POA: Diagnosis not present

## 2022-05-29 DIAGNOSIS — F419 Anxiety disorder, unspecified: Secondary | ICD-10-CM | POA: Diagnosis not present

## 2022-05-29 DIAGNOSIS — R5383 Other fatigue: Secondary | ICD-10-CM

## 2022-05-29 DIAGNOSIS — R739 Hyperglycemia, unspecified: Secondary | ICD-10-CM

## 2022-05-29 DIAGNOSIS — R4189 Other symptoms and signs involving cognitive functions and awareness: Secondary | ICD-10-CM

## 2022-05-29 DIAGNOSIS — E785 Hyperlipidemia, unspecified: Secondary | ICD-10-CM | POA: Diagnosis not present

## 2022-05-29 NOTE — Progress Notes (Signed)
Careteam: Patient Care Team: Yvonna Alanis, NP as PCP - General (Adult Health Nurse Practitioner) Druscilla Brownie, MD as Consulting Physician (Dermatology) Marilynne Halsted, MD as Referring Physician (Ophthalmology) Ward Givens, NP as Nurse Practitioner (Neurology) Robley Fries, MD as Consulting Physician (Urology)  Seen by: Windell Moulding, AGNP-C  PLACE OF SERVICE:  [place] Directive information Does Patient Have a Medical Advance Directive?: No, Would patient like information on creating a medical advance directive?: No - Patient declined  Allergies  Allergen Reactions   Morphine Other (See Comments)    REACTION: lethargy/malaise   Metoclopramide Hcl Anxiety and Other (See Comments)    REACTION: anxious and out of her mind   Aricept [Donepezil Hcl]     nausea   Doxycycline Diarrhea   Penicillins Itching and Rash    Has patient had a PCN reaction causing immediate rash, facial/tongue/throat swelling, SOB or lightheadedness with hypotension: No Has patient had a PCN reaction causing severe rash involving mucus membranes or skin necrosis: No Has patient had a PCN reaction that required hospitalization No Has patient had a PCN reaction occurring within the last 10 years: Yes If all of the above answers are "NO", then may proceed with Cephalosporin use.     Prednisone Itching and Rash   Sulfonamide Derivatives Itching and Rash    Chief Complaint  Patient presents with   Medical Management of Chronic Issues    Blood pressure check.     HPI: Patient is a 82 y.o. female seen today for acute visit to recheck blood pressure.  Lab results discussed with patient.   Glucose 103, requesting to have A1c drawn. Admits to eating a lot of sweets.   Cholesterol improved after restarting atorvastatin.   Blood pressure was elevated last encounter. Thought to be related to Myrbetriq. She is off Myrbetriq at this time. She recently started [place] Does not  think it is working. Home blood pressures averaging 130-140's. She did not bring readings with her.   She is having increased anxiety due to memory issues. She does not understand why a MRI brain was recommended. She is followed by Dr. Krista Blue, [facility] last encounter. CT head completed 03/2021 noted microvascular disease. We discussed brain health in detail today. We also discussed therapy to help with anxiety. Remains on hydroxyzine prn for increased anxiety/panic.       Review of Systems:  Review of Systems  Constitutional:  Negative for chills and fever.  HENT:  Negative for congestion and sore throat.   Eyes:  Negative for blurred vision and double vision.  Respiratory:  Negative for cough, shortness of breath and wheezing.   Cardiovascular:  Negative for chest pain and leg swelling.  Gastrointestinal:  Negative for abdominal pain and blood in stool.  Genitourinary:  Negative for dysuria.  Musculoskeletal:  Positive for joint pain. Negative for falls.  Skin:  Negative for rash.  Neurological:  Negative for dizziness and headaches.  Psychiatric/Behavioral:  Positive for memory loss. Negative for depression. The patient is nervous/anxious.     Past Medical History:  Diagnosis Date   Acute cystitis    Anxiety    Aphasia    transsient aphasia Jan 2012   Asthmatic bronchitis    Cerebrovascular disease    Diverticulosis of colon    DJD (degenerative joint disease)    Dysphonia    Esophageal stricture    Fibromyalgia    Gastroparesis    GERD (gastroesophageal reflux disease)  Hematuria, microscopic    Hemorrhoids    Hypercholesteremia    IBS (irritable bowel syndrome)    Memory deficits 02/15/2013   RLS (restless legs syndrome)    TIA (transient ischemic attack)    Urinary incontinence    Vitamin D deficiency    Past Surgical History:  Procedure Laterality Date   ABDOMINAL HYSTERECTOMY  02/24/1993   cataract surgery Right 12/19/2021   corneal transplant for  keratonconus     EYE SURGERY  02/25/2011   left foot fracture with ORIF  07/25/2004   Dr. Sharol Given   right bunion surgery     Social History:   reports that she has never smoked. She has never used smokeless tobacco. She reports current alcohol use. She reports that she does not use drugs.  Family History  Problem Relation Age of Onset   Hypertension Mother    Anemia Mother    Dementia Mother    Kidney disease Father    Hyperlipidemia Sister    Diabetes Brother    Lung cancer Brother    Dementia Maternal Grandmother    Diabetes Other        aunt,brother,uncle   Breast cancer Maternal Aunt     Medications: Patient's Medications  New Prescriptions   No medications on file  Previous Medications   ASPIRIN 325 MG EC TABLET    Take 325 mg by mouth daily.   ATORVASTATIN (LIPITOR) 20 MG TABLET    Take 1 tablet (20 mg total) by mouth every Monday, Wednesday, and Friday.   BIOTIN 1000 MCG TABLET    Take 1,000 mcg by mouth 3 (three) times daily.   CHOLECALCIFEROL (VITAMIN D) 50 MCG (2000 UT) CAPS    Take by mouth.   COLLAGEN-VITAMIN C PO    Take by mouth.   CYANOCOBALAMIN (VITAMIN B12) 1000 MCG TABLET    Take 1 tablet (1,000 mcg total) by mouth daily.   DULOXETINE (CYMBALTA) 60 MG CAPSULE    Take 1 capsule (60 mg total) by mouth daily.   FLUTICASONE (FLONASE) 50 MCG/ACT NASAL SPRAY    Place 2 sprays into both nostrils daily as needed for allergies.   HYDROXYZINE (VISTARIL) 25 MG CAPSULE    Take 1 capsule (25 mg total) by mouth daily as needed. For panic attack or increased anxiety   OMEGA-3 FATTY ACIDS (FISH OIL) 1000 MG CAPS    Take by mouth.   OMEPRAZOLE (PRILOSEC) 40 MG CAPSULE    TAKE 1 CAPSULE TWICE DAILY   PREDNISOLONE ACETATE (PRED FORTE) 1 % OPHTHALMIC SUSPENSION    Place 1 drop into both eyes as directed.  Modified Medications   No medications on file  Discontinued Medications   No medications on file    Physical Exam:  Vitals:   05/29/22 1012  BP: (!) 142/74  Pulse: 76   Resp: 16  Temp: (!) 97.4 F (36.3 C)  SpO2: 92%  Weight: 118 lb 9.6 oz (53.8 kg)  Height: 5\' 1"  (1.549 m)   Body mass index is 22.41 kg/m. Wt Readings from Last 3 Encounters:  05/29/22 118 lb 9.6 oz (53.8 kg)  05/08/22 118 lb 8 oz (53.8 kg)  05/01/22 121 lb (54.9 kg)    Physical Exam Vitals reviewed.  Constitutional:      General: She is not in acute distress. HENT:     Head: Normocephalic.  Eyes:     General:        Right eye: No discharge.  Left eye: No discharge.  Cardiovascular:     Rate and Rhythm: Normal rate and regular rhythm.     Pulses: Normal pulses.     Heart sounds: Normal heart sounds.  Pulmonary:     Effort: Pulmonary effort is normal.     Breath sounds: Normal breath sounds.  Abdominal:     General: Bowel sounds are normal. There is no distension.     Palpations: Abdomen is soft.     Tenderness: There is no abdominal tenderness.  Musculoskeletal:     Cervical back: Neck supple.     Right lower leg: No edema.     Left lower leg: No edema.  Skin:    General: Skin is warm and dry.     Capillary Refill: Capillary refill takes less than 2 seconds.  Neurological:     General: No focal deficit present.     Mental Status: She is alert. Mental status is at baseline.     Motor: No weakness.     Gait: Gait normal.  Psychiatric:        Mood and Affect: Mood normal.     Comments: Very pleasant, appropriate, repeated herself a few times, wrote notes during encounter     Labs reviewed: Basic Metabolic Panel: Recent Labs    07/25/21 1446 04/18/22 1613 05/22/22 0928  NA 139  --  143  K 3.8  --  4.0  CL 102  --  104  CO2 30  --  30  GLUCOSE 83  --  103*  BUN 20  --  13  CREATININE 0.79  --  0.90  CALCIUM 9.4  --  9.2  TSH 1.30 1.07  --    Liver Function Tests: Recent Labs    07/25/21 1446 05/22/22 0928  AST 21 20  ALT 11 14  ALKPHOS 83  --   BILITOT 0.5 0.4  PROT 7.0 6.7  ALBUMIN 4.1  --    No results for input(s): "LIPASE",  "AMYLASE" in the last 8760 hours. No results for input(s): "AMMONIA" in the last 8760 hours. CBC: Recent Labs    07/25/21 1446 05/22/22 0928  WBC 4.6 3.5*  NEUTROABS 3.2 1,383*  HGB 13.2 12.9  HCT 39.7 38.8  MCV 90.6 90.4  PLT 158.0 171   Lipid Panel: Recent Labs    07/25/21 1446 05/22/22 0928  CHOL 228* 168  HDL 78.10 89  LDLCALC 137* 63  TRIG 65.0 77  CHOLHDL 3 1.9   TSH: Recent Labs    07/25/21 1446 04/18/22 1613  TSH 1.30 1.07   A1C: Lab Results  Component Value Date   HGBA1C 5.6 03/21/2015     Assessment/Plan 1. Hyperglycemia - glucose> was 83 103 05/22/2022 - Hemoglobin A1c  2. Primary hypertension - controlled - elevated while on Myrbetriq - now on Gemtesa, SBP 130-140's - she plans to stop Gemtesa - cont low sodium diet  3. Hyperlipidemia, unspecified hyperlipidemia type - Total 168, LDL 63 05/22/2022 - cont atorvastatin  4. Cognitive impairment - followed by Dr. Krista Blue - [place] 21/30 - strong family history of dementia - CT head 03/2021 indicated microvascular changes - MRI brain recommended> recommend scheduling  5. Fatigue, unspecified type - cbc/diff, iron panel normal - TSH normal  - ? Sleep apnea versus depression/anxiety  6. Anxiety - associated with memory impairment - cont hydroxyzine prn - informations given about [facility] - not interested in medication at this time  Total time: 40 minutes. Greater than 50% of  total time spent doing patient education regarding health maintenance, HTN, hyperglycemia, memory impairment, fatigue and anxiety including symptom/medication management.     Next appt: Visit date not found  [place] [phone]

## 2022-05-29 NOTE — Patient Instructions (Addendum)
Stop Gemtesa if you do not think it is working> will make blood pressure better> limit fluids before bed  Please schedule MRI of brain> you had CT head last year> showed vascular changes  Ask Dr. Krista Blue about having sleep study done  Brain health> eats fruits and vegetables, lean protein  Therapy> [facility]- [phone]

## 2022-05-30 LAB — HEMOGLOBIN A1C
Hgb A1c MFr Bld: 5.7 % of total Hgb — ABNORMAL HIGH (ref <5.7)
Mean Plasma Glucose: 117 mg/dL
eAG (mmol/L): 6.5 mmol/L

## 2022-06-05 ENCOUNTER — Ambulatory Visit: Payer: Medicare HMO

## 2022-06-05 DIAGNOSIS — R269 Unspecified abnormalities of gait and mobility: Secondary | ICD-10-CM | POA: Diagnosis not present

## 2022-06-05 DIAGNOSIS — M6281 Muscle weakness (generalized): Secondary | ICD-10-CM | POA: Diagnosis not present

## 2022-06-05 NOTE — Therapy (Signed)
OUTPATIENT PHYSICAL THERAPY NEURO TREATMENT   Progress Note Reporting Period 04/03/22 to 06/05/22  See note below for Objective Data and Assessment of Progress/Goals.     Patient Name: Paige SalterRuth S Pena MRN: [id number] DOB:09/10/1940, 82 y.o., female Today's Date: 06/05/2022   PCP: Hazle NordmannAmy Fargo, NP REFERRING PROVIDER: Dr. Levert FeinsteinYijun Yan  END OF SESSION:  PT End of Session - 06/05/22 1146     Visit Number 10    Date for PT Re-Evaluation 06/13/22    Authorization Type [employer]    Authorization - Number of Visits 4    PT Start Time 1145    PT Stop Time 1230    PT Time Calculation (min) 45 min    Activity Tolerance Patient tolerated treatment well    Behavior [facility] for tasks assessed/performed                 Past Medical History:  Diagnosis Date   Acute cystitis    Anxiety    Aphasia    transsient aphasia Jan 2012   Asthmatic bronchitis    Cerebrovascular disease    Diverticulosis of colon    DJD (degenerative joint disease)    Dysphonia    Esophageal stricture    Fibromyalgia    Gastroparesis    GERD (gastroesophageal reflux disease)    Hematuria, microscopic    Hemorrhoids    Hypercholesteremia    IBS (irritable bowel syndrome)    Memory deficits 02/15/2013   RLS (restless legs syndrome)    TIA (transient ischemic attack)    Urinary incontinence    Vitamin D deficiency    Past Surgical History:  Procedure Laterality Date   ABDOMINAL HYSTERECTOMY  02/24/1993   cataract surgery Right 12/19/2021   corneal transplant for keratonconus     EYE SURGERY  02/25/2011   left foot fracture with ORIF  07/25/2004   Dr. Lajoyce Cornersuda   right bunion surgery     Patient Active Problem List   Diagnosis Date Noted   Pulmonary nodule 09/03/2021   Osteopenia 07/25/2021   Throat irritation 11/28/2019   HLD (hyperlipidemia)    Overactive bladder 05/08/2015   Aphasia    Cervical spondylitis    Paresthesias 03/21/2015   TIA (transient ischemic attack) 03/21/2015    Physical exam 10/05/2014   Abdominal pain 05/17/2014   Allergic rhinitis 05/17/2014   Fatigue 04/04/2014   Anxiety and depression 07/25/2013   Weight loss 07/25/2013   Cognitive impairment 02/15/2013   Lumbar spondylosis 09/21/2012   Tachycardia 09/25/2011   Palpitations 09/16/2010   Dysphonia 09/05/2010   Slowing of urinary stream 09/25/2009   Cerebrovascular disease 04/03/2008   DYSPHONIA 04/03/2008   Diverticulosis of large intestine 04/02/2008   Irritable bowel syndrome 04/02/2008   DEGENERATIVE JOINT DISEASE 04/02/2008   HEMATURIA, MICROSCOPIC, HX OF 04/02/2008   Vitamin D deficiency 03/28/2008   Fibromyalgia 03/28/2008   URINARY INCONTINENCE 03/28/2008   GERD 08/02/2007   Gastroparesis 08/02/2007   HEMORRHOIDS 07/30/2007   ESOPHAGEAL STRICTURE 07/30/2007   Anxiety state 07/30/2007    ONSET DATE: 03/27/22  REFERRING DIAG: R26.9 (ICD-10-CM) - Abnormal gait   THERAPY DIAG:  Muscle weakness (generalized)  Abnormal gait  Rationale for Evaluation and Treatment: Rehabilitation  SUBJECTIVE:  SUBJECTIVE STATEMENT: I had a birthday yesterday, I am doing well.   Pt accompanied by: self  PERTINENT HISTORY: fibromyalgia, anxiety, TIA x 2 (last one was about 5 years ago); pt reports of having 4 screws in left foot from fractured foot years ago from fall.  PAIN:  Are you having pain? No  PRECAUTIONS: None  WEIGHT BEARING RESTRICTIONS: No  FALLS: Has patient fallen in last 6 months? No  LIVING ENVIRONMENT: Lives with: lives with their spouse Lives in: House/apartment Stairs: Yes: External: 3 steps; can reach both Has following equipment at home: None  PLOF: Independent  PATIENT GOALS: improve balance with walking  OBJECTIVE:   DIAGNOSTIC FINDINGS: 03/26/22 Cervical MRI MRI  cervical spine (without) demonstrating: - At C3-4 disc bulging and facet hypertrophy with mild spinal stenosis and severe bilateral foraminal stenosis. - At C6-7 disc bulging with mild spinal stenosis and mild bilateral foraminal stenosis. - At C4-5, C5-6 uncovertebral joint hypertrophy with moderate bilateral foraminal stenosis. -Mild progression of degenerative changes since 2017.  COGNITION: Overall cognitive status: Within functional limits for tasks assessed   LOWER EXTREMITY MMT:    MMT Right Eval Left Eval  Hip flexion 5 4  Hip extension    Hip abduction 5 5  Hip adduction 5 5  Hip internal rotation    Hip external rotation    Knee flexion 5 5  Knee extension 5 5  Ankle dorsiflexion 5 4  Ankle plantarflexion 5 4  Ankle inversion    Ankle eversion    (Blank rows = not tested)  FUNCTIONAL TESTS:  5 times sit to stand: 13.6 sec without UE support FGA: 27/30 MCTSIB: Condition 1: Avg of 3 trials: 30 sec, Condition 2: Avg of 3 trials: 30 sec, Condition 3: Avg of 3 trials: 30 sec, Condition 4: Avg of 3 trials: 30 sec, and Total Score: 120/120   TODAY'S TREATMENT:                                                                                                                              DATE:  06/05/22 NuStep L5 x11mins Reassess goals  Step ups 6" with airex on top Leg ext 10# 2x10 HS curls 20# 2x10  Leg press 30# 2x10    05/27/22 NuStep L5 x63mins  Resisted gait 20# 4x4 way Calf raises 2x12 Side steps on airex  Leg press 30# 2x10 Walking on beam   05/13/22 STS on airex 2x10  Tandem walking on firm  Walking on beam  SLS 10s bilaterally  SLS on airex 5s bilaterally Eyes closed walking forwards and backwards Step ups from airex 6"   05/07/22 FGA- 27/30 5xSTS- 12.7s NuStep L5 x77mins Standing on airex feet together, then EC Tandem on airex- CGA due to unsteadiness  Lateral band walks green 2x10  Walk outdoors big loop    04/28/22 Step ups on airex on top  of 6" step, then lateral step ups  Leg ext 10#  2x10 HS curls 15# 2x10 Walking with direction changes while catching ball  Walking on beam    04/24/22 Bike L2 x27mins  Leg ext 10# 2x10 HS curls 20# 2x10 Resisted side steps 20# 5x each side  Standing on airex feet together, then eyes closed  Side steps on airex  Hitting ball standing on airex  04/23/22 NuStep L5 x8mins  3 way hip 3# 2x10 STS on airex 2x10 Step ups 6" from airex Leg press 20# 2x10   04/16/22 NuStep L5 x60mins  Walking catching ball, direction changes Resisted gait 20# 4 way x4 Standing on airex hitting volleyball Marching on airex   04/10/22 NuStep L5 x6mins Walking on beam Standing on airex feet together, tandem then with eyes closed 3-5s secs at best with eyes closed  Leg ext 10# 2x10 HS curls 20# 2x10  Leg press 20# 2x10 STS on airex 2x10   PATIENT EDUCATION: Education details: Pt educated on evaluation findings, discussed goals Person educated: Patient Education method: Explanation Education comprehension: verbalized understanding  HOME EXERCISE PROGRAM: TBD  GOALS: Goals reviewed with patient? Yes  SHORT TERM GOALS: Target date: 05/01/2022   Patient will demo 30/30 on FGA to improve dynamic balance with functional mobility Baseline: 27/30 05/07/22, 28-4/11/24 Goal status: IN PROGRESS  2.  Pt will demo <12 seconds with 5x sit to stand to improve functional strength with transfers. Baseline: 13.6 sec (no UE support)(04/03/22), 12.7s 05/07/22, 12s 06/05/22 Goal status: MET   LONG TERM GOALS: Target date: 06/13/2022  Pt will be I and compliant with HEP to self manage her symptoms. Baseline: HEP to issued in future sessions. Goal status: IN PROGRESS  2. Patient will be able to take 5 consecutive tandem steps   Baseline: 2 steps at best, 4-4/11/24  Goal status: in progress    ASSESSMENT:  CLINICAL IMPRESSION: Patient still working on goals, difficulty with tandem steps. Continued to work  on LE strength and balance.   OBJECTIVE IMPAIRMENTS: Abnormal gait, decreased balance, decreased endurance, difficulty walking, decreased strength, increased muscle spasms, postural dysfunction, and pain.   ACTIVITY LIMITATIONS: lifting, sitting, squatting, and transfers  PARTICIPATION LIMITATIONS: cleaning, shopping, and community activity  PERSONAL FACTORS: Age, Past/current experiences, and Time since onset of injury/illness/exacerbation are also affecting patient's functional outcome.   REHAB POTENTIAL: Excellent  CLINICAL DECISION MAKING: Stable/uncomplicated  EVALUATION COMPLEXITY: Low  PLAN:  PT FREQUENCY: 2x/week  PT DURATION: 6 weeks (8 sessions plus eval)  PLANNED INTERVENTIONS: Therapeutic exercises, Therapeutic activity, Neuromuscular re-education, Balance training, Gait training, Patient/Family education, Self Care, Joint mobilization, Joint manipulation, Stair training, Orthotic/Fit training, Spinal mobilization, Cryotherapy, Moist heat, Manual therapy, and Re-evaluation  PLAN FOR NEXT SESSION: Continue to work on dynamic gait with eyes closed, on non-compliant surfaces, static balance with eyes closed and non compliant surface functional strength   [employer], PT 06/05/2022, 12:30 PM

## 2022-06-09 ENCOUNTER — Ambulatory Visit: Payer: Medicare HMO

## 2022-06-09 DIAGNOSIS — R269 Unspecified abnormalities of gait and mobility: Secondary | ICD-10-CM | POA: Diagnosis not present

## 2022-06-09 DIAGNOSIS — M6281 Muscle weakness (generalized): Secondary | ICD-10-CM

## 2022-06-09 NOTE — Therapy (Signed)
OUTPATIENT PHYSICAL THERAPY NEURO TREATMENT    Patient Name: Paige Pena MRN: [id number] DOB:Jul 14, 1940, 82 y.o., female Today's Date: 06/09/2022   PCP: Hazle Nordmann, NP REFERRING PROVIDER: Dr. Levert Feinstein  END OF SESSION:  PT End of Session - 06/09/22 1406     Visit Number 11    Date for PT Re-Evaluation 06/13/22    Authorization Type [employer]    Authorization - Number of Visits 4    PT Start Time 1406    PT Stop Time 1445    PT Time Calculation (min) 39 min    Activity Tolerance Patient tolerated treatment well    Behavior During Therapy WFL for tasks assessed/performed                 Past Medical History:  Diagnosis Date   Acute cystitis    Anxiety    Aphasia    transsient aphasia Jan 2012   Asthmatic bronchitis    Cerebrovascular disease    Diverticulosis of colon    DJD (degenerative joint disease)    Dysphonia    Esophageal stricture    Fibromyalgia    Gastroparesis    GERD (gastroesophageal reflux disease)    Hematuria, microscopic    Hemorrhoids    Hypercholesteremia    IBS (irritable bowel syndrome)    Memory deficits 02/15/2013   RLS (restless legs syndrome)    TIA (transient ischemic attack)    Urinary incontinence    Vitamin D deficiency    Past Surgical History:  Procedure Laterality Date   ABDOMINAL HYSTERECTOMY  02/24/1993   cataract surgery Right 12/19/2021   corneal transplant for keratonconus     EYE SURGERY  02/25/2011   left foot fracture with ORIF  07/25/2004   Dr. Lajoyce Corners   right bunion surgery     Patient Active Problem List   Diagnosis Date Noted   Pulmonary nodule 09/03/2021   Osteopenia 07/25/2021   Throat irritation 11/28/2019   HLD (hyperlipidemia)    Overactive bladder 05/08/2015   Aphasia    Cervical spondylitis    Paresthesias 03/21/2015   TIA (transient ischemic attack) 03/21/2015   Physical exam 10/05/2014   Abdominal pain 05/17/2014   Allergic rhinitis 05/17/2014   Fatigue 04/04/2014   Anxiety and  depression 07/25/2013   Weight loss 07/25/2013   Cognitive impairment 02/15/2013   Lumbar spondylosis 09/21/2012   Tachycardia 09/25/2011   Palpitations 09/16/2010   Dysphonia 09/05/2010   Slowing of urinary stream 09/25/2009   Cerebrovascular disease 04/03/2008   DYSPHONIA 04/03/2008   Diverticulosis of large intestine 04/02/2008   Irritable bowel syndrome 04/02/2008   DEGENERATIVE JOINT DISEASE 04/02/2008   HEMATURIA, MICROSCOPIC, HX OF 04/02/2008   Vitamin D deficiency 03/28/2008   Fibromyalgia 03/28/2008   URINARY INCONTINENCE 03/28/2008   GERD 08/02/2007   Gastroparesis 08/02/2007   HEMORRHOIDS 07/30/2007   ESOPHAGEAL STRICTURE 07/30/2007   Anxiety state 07/30/2007    ONSET DATE: 03/27/22  REFERRING DIAG: R26.9 (ICD-10-CM) - Abnormal gait   THERAPY DIAG:  Muscle weakness (generalized)  Abnormal gait  Rationale for Evaluation and Treatment: Rehabilitation  SUBJECTIVE:  SUBJECTIVE STATEMENT: I had a birthday yesterday, I am doing well.   Pt accompanied by: self  PERTINENT HISTORY: fibromyalgia, anxiety, TIA x 2 (last one was about 5 years ago); pt reports of having 4 screws in left foot from fractured foot years ago from fall.  PAIN:  Are you having pain? No  PRECAUTIONS: None  WEIGHT BEARING RESTRICTIONS: No  FALLS: Has patient fallen in last 6 months? No  LIVING ENVIRONMENT: Lives with: lives with their spouse Lives in: House/apartment Stairs: Yes: External: 3 steps; can reach both Has following equipment at home: None  PLOF: Independent  PATIENT GOALS: improve balance with walking  OBJECTIVE:   DIAGNOSTIC FINDINGS: 03/26/22 Cervical MRI MRI cervical spine (without) demonstrating: - At C3-4 disc bulging and facet hypertrophy with mild spinal stenosis and severe  bilateral foraminal stenosis. - At C6-7 disc bulging with mild spinal stenosis and mild bilateral foraminal stenosis. - At C4-5, C5-6 uncovertebral joint hypertrophy with moderate bilateral foraminal stenosis. -Mild progression of degenerative changes since 2017.  COGNITION: Overall cognitive status: Within functional limits for tasks assessed   LOWER EXTREMITY MMT:    MMT Right Eval Left Eval  Hip flexion 5 4  Hip extension    Hip abduction 5 5  Hip adduction 5 5  Hip internal rotation    Hip external rotation    Knee flexion 5 5  Knee extension 5 5  Ankle dorsiflexion 5 4  Ankle plantarflexion 5 4  Ankle inversion    Ankle eversion    (Blank rows = not tested)  FUNCTIONAL TESTS:  5 times sit to stand: 13.6 sec without UE support FGA: 27/30 MCTSIB: Condition 1: Avg of 3 trials: 30 sec, Condition 2: Avg of 3 trials: 30 sec, Condition 3: Avg of 3 trials: 30 sec, Condition 4: Avg of 3 trials: 30 sec, and Total Score: 120/120   TODAY'S TREATMENT:                                                                                                                              DATE:  06/09/22 Walk outdoors NuStep L5 x63mins  Resisted gait 20# 4 way x4 STS on airex with OHP 2x10 2 way hip with 3# 2x10   06/05/22 NuStep L5 x22mins Reassess goals  Step ups 6" with airex on top Leg ext 10# 2x10 HS curls 20# 2x10  Leg press 30# 2x10    05/27/22 NuStep L5 x76mins  Resisted gait 20# 4x4 way Calf raises 2x12 Side steps on airex  Leg press 30# 2x10 Walking on beam   05/13/22 STS on airex 2x10  Tandem walking on firm  Walking on beam  SLS 10s bilaterally  SLS on airex 5s bilaterally Eyes closed walking forwards and backwards Step ups from airex 6"   05/07/22 FGA- 27/30 5xSTS- 12.7s NuStep L5 x47mins Standing on airex feet together, then EC Tandem on airex- CGA due to unsteadiness  Lateral band walks green 2x10  Walk outdoors big loop    04/28/22 Step ups on airex on  top of 6" step, then lateral step ups  Leg ext 10# 2x10 HS curls 15# 2x10 Walking with direction changes while catching ball  Walking on beam    04/24/22 Bike L2 x64mins  Leg ext 10# 2x10 HS curls 20# 2x10 Resisted side steps 20# 5x each side  Standing on airex feet together, then eyes closed  Side steps on airex  Hitting ball standing on airex  04/23/22 NuStep L5 x43mins  3 way hip 3# 2x10 STS on airex 2x10 Step ups 6" from airex Leg press 20# 2x10   04/16/22 NuStep L5 x69mins  Walking catching ball, direction changes Resisted gait 20# 4 way x4 Standing on airex hitting volleyball Marching on airex   04/10/22 NuStep L5 x28mins Walking on beam Standing on airex feet together, tandem then with eyes closed 3-5s secs at best with eyes closed  Leg ext 10# 2x10 HS curls 20# 2x10  Leg press 20# 2x10 STS on airex 2x10   PATIENT EDUCATION: Education details: Pt educated on evaluation findings, discussed goals Person educated: Patient Education method: Explanation Education comprehension: verbalized understanding  HOME EXERCISE PROGRAM: TBD  GOALS: Goals reviewed with patient? Yes  SHORT TERM GOALS: Target date: 05/01/2022   Patient will demo 30/30 on FGA to improve dynamic balance with functional mobility Baseline: 27/30 05/07/22, 28-4/11/24 Goal status: IN PROGRESS  2.  Pt will demo <12 seconds with 5x sit to stand to improve functional strength with transfers. Baseline: 13.6 sec (no UE support)(04/03/22), 12.7s 05/07/22, 12s 06/05/22 Goal status: MET   LONG TERM GOALS: Target date: 06/13/2022  Pt will be I and compliant with HEP to self manage her symptoms. Baseline: HEP to issued in future sessions. Goal status: IN PROGRESS  2. Patient will be able to take 5 consecutive tandem steps   Baseline: 2 steps at best, 4-4/11/24  Goal status: in progress    ASSESSMENT:  CLINICAL IMPRESSION: Patient doing well. Continued to work on LE strength and balance. Some  fatigue with STS and leg kicks today but overall doing well and can be d/c next visit.    OBJECTIVE IMPAIRMENTS: Abnormal gait, decreased balance, decreased endurance, difficulty walking, decreased strength, increased muscle spasms, postural dysfunction, and pain.   ACTIVITY LIMITATIONS: lifting, sitting, squatting, and transfers  PARTICIPATION LIMITATIONS: cleaning, shopping, and community activity  PERSONAL FACTORS: Age, Past/current experiences, and Time since onset of injury/illness/exacerbation are also affecting patient's functional outcome.   REHAB POTENTIAL: Excellent  CLINICAL DECISION MAKING: Stable/uncomplicated  EVALUATION COMPLEXITY: Low  PLAN:  PT FREQUENCY: 2x/week  PT DURATION: 6 weeks (8 sessions plus eval)  PLANNED INTERVENTIONS: Therapeutic exercises, Therapeutic activity, Neuromuscular re-education, Balance training, Gait training, Patient/Family education, Self Care, Joint mobilization, Joint manipulation, Stair training, Orthotic/Fit training, Spinal mobilization, Cryotherapy, Moist heat, Manual therapy, and Re-evaluation  PLAN FOR NEXT SESSION: Continue to work on dynamic gait with eyes closed, on non-compliant surfaces, static balance with eyes closed and non compliant surface functional strength   Cassie Freer, PT 06/09/2022, 2:45 PM

## 2022-06-12 ENCOUNTER — Ambulatory Visit: Payer: Medicare HMO

## 2022-06-12 DIAGNOSIS — M6281 Muscle weakness (generalized): Secondary | ICD-10-CM | POA: Diagnosis not present

## 2022-06-12 DIAGNOSIS — R269 Unspecified abnormalities of gait and mobility: Secondary | ICD-10-CM

## 2022-06-12 NOTE — Therapy (Signed)
OUTPATIENT PHYSICAL THERAPY NEURO TREATMENT    Patient Name: Paige Pena MRN: [id number] DOB:09/08/1940, 82 y.o., female Today's Date: 06/12/2022   PCP: Hazle Nordmann, NP REFERRING PROVIDER: Dr. Levert Feinstein  END OF SESSION:  PT End of Session - 06/12/22 1244     Visit Number 12    Date for PT Re-Evaluation 06/13/22    Authorization Type [employer]    Authorization - Number of Visits 4    PT Start Time 1243    PT Stop Time 1315    PT Time Calculation (min) 32 min    Activity Tolerance Patient tolerated treatment well    Behavior During Therapy WFL for tasks assessed/performed                  Past Medical History:  Diagnosis Date   Acute cystitis    Anxiety    Aphasia    transsient aphasia Jan 2012   Asthmatic bronchitis    Cerebrovascular disease    Diverticulosis of colon    DJD (degenerative joint disease)    Dysphonia    Esophageal stricture    Fibromyalgia    Gastroparesis    GERD (gastroesophageal reflux disease)    Hematuria, microscopic    Hemorrhoids    Hypercholesteremia    IBS (irritable bowel syndrome)    Memory deficits 02/15/2013   RLS (restless legs syndrome)    TIA (transient ischemic attack)    Urinary incontinence    Vitamin D deficiency    Past Surgical History:  Procedure Laterality Date   ABDOMINAL HYSTERECTOMY  02/24/1993   cataract surgery Right 12/19/2021   corneal transplant for keratonconus     EYE SURGERY  02/25/2011   left foot fracture with ORIF  07/25/2004   Dr. Lajoyce Corners   right bunion surgery     Patient Active Problem List   Diagnosis Date Noted   Pulmonary nodule 09/03/2021   Osteopenia 07/25/2021   Throat irritation 11/28/2019   HLD (hyperlipidemia)    Overactive bladder 05/08/2015   Aphasia    Cervical spondylitis    Paresthesias 03/21/2015   TIA (transient ischemic attack) 03/21/2015   Physical exam 10/05/2014   Abdominal pain 05/17/2014   Allergic rhinitis 05/17/2014   Fatigue 04/04/2014   Anxiety and  depression 07/25/2013   Weight loss 07/25/2013   Cognitive impairment 02/15/2013   Lumbar spondylosis 09/21/2012   Tachycardia 09/25/2011   Palpitations 09/16/2010   Dysphonia 09/05/2010   Slowing of urinary stream 09/25/2009   Cerebrovascular disease 04/03/2008   DYSPHONIA 04/03/2008   Diverticulosis of large intestine 04/02/2008   Irritable bowel syndrome 04/02/2008   DEGENERATIVE JOINT DISEASE 04/02/2008   HEMATURIA, MICROSCOPIC, HX OF 04/02/2008   Vitamin D deficiency 03/28/2008   Fibromyalgia 03/28/2008   URINARY INCONTINENCE 03/28/2008   GERD 08/02/2007   Gastroparesis 08/02/2007   HEMORRHOIDS 07/30/2007   ESOPHAGEAL STRICTURE 07/30/2007   Anxiety state 07/30/2007    ONSET DATE: 03/27/22  REFERRING DIAG: R26.9 (ICD-10-CM) - Abnormal gait   THERAPY DIAG:  Muscle weakness (generalized)  Abnormal gait  Rationale for Evaluation and Treatment: Rehabilitation  SUBJECTIVE:  SUBJECTIVE STATEMENT: I had a birthday yesterday, I am doing well.   Pt accompanied by: self  PERTINENT HISTORY: fibromyalgia, anxiety, TIA x 2 (last one was about 5 years ago); pt reports of having 4 screws in left foot from fractured foot years ago from fall.  PAIN:  Are you having pain? No  PRECAUTIONS: None  WEIGHT BEARING RESTRICTIONS: No  FALLS: Has patient fallen in last 6 months? No  LIVING ENVIRONMENT: Lives with: lives with their spouse Lives in: House/apartment Stairs: Yes: External: 3 steps; can reach both Has following equipment at home: None  PLOF: Independent  PATIENT GOALS: improve balance with walking  OBJECTIVE:   DIAGNOSTIC FINDINGS: 03/26/22 Cervical MRI MRI cervical spine (without) demonstrating: - At C3-4 disc bulging and facet hypertrophy with mild spinal stenosis and severe  bilateral foraminal stenosis. - At C6-7 disc bulging with mild spinal stenosis and mild bilateral foraminal stenosis. - At C4-5, C5-6 uncovertebral joint hypertrophy with moderate bilateral foraminal stenosis. -Mild progression of degenerative changes since 2017.  COGNITION: Overall cognitive status: Within functional limits for tasks assessed   LOWER EXTREMITY MMT:    MMT Right Eval Left Eval  Hip flexion 5 4  Hip extension    Hip abduction 5 5  Hip adduction 5 5  Hip internal rotation    Hip external rotation    Knee flexion 5 5  Knee extension 5 5  Ankle dorsiflexion 5 4  Ankle plantarflexion 5 4  Ankle inversion    Ankle eversion    (Blank rows = not tested)  FUNCTIONAL TESTS:  5 times sit to stand: 13.6 sec without UE support FGA: 27/30 MCTSIB: Condition 1: Avg of 3 trials: 30 sec, Condition 2: Avg of 3 trials: 30 sec, Condition 3: Avg of 3 trials: 30 sec, Condition 4: Avg of 3 trials: 30 sec, and Total Score: 120/120   TODAY'S TREATMENT:                                                                                                                              DATE:  06/12/22 Bike L3 x26mins  Tandem walking Walking hitting ball forwards, backwards, and side steps  Side steps on airex HEP  06/09/22 Walk outdoors NuStep L5 x40mins  Resisted gait 20# 4 way x4 STS on airex with OHP 2x10 2 way hip with 3# 2x10   06/05/22 NuStep L5 x52mins Reassess goals  Step ups 6" with airex on top Leg ext 10# 2x10 HS curls 20# 2x10  Leg press 30# 2x10    05/27/22 NuStep L5 x12mins  Resisted gait 20# 4x4 way Calf raises 2x12 Side steps on airex  Leg press 30# 2x10 Walking on beam   05/13/22 STS on airex 2x10  Tandem walking on firm  Walking on beam  SLS 10s bilaterally  SLS on airex 5s bilaterally Eyes closed walking forwards and backwards Step ups from airex 6"   05/07/22 FGA- 27/30 5xSTS- 12.7s NuStep  L5 x34mins Standing on airex feet together, then  EC Tandem on airex- CGA due to unsteadiness  Lateral band walks green 2x10  Walk outdoors big loop    04/28/22 Step ups on airex on top of 6" step, then lateral step ups  Leg ext 10# 2x10 HS curls 15# 2x10 Walking with direction changes while catching ball  Walking on beam    04/24/22 Bike L2 x46mins  Leg ext 10# 2x10 HS curls 20# 2x10 Resisted side steps 20# 5x each side  Standing on airex feet together, then eyes closed  Side steps on airex  Hitting ball standing on airex  04/23/22 NuStep L5 x2mins  3 way hip 3# 2x10 STS on airex 2x10 Step ups 6" from airex Leg press 20# 2x10   04/16/22 NuStep L5 x97mins  Walking catching ball, direction changes Resisted gait 20# 4 way x4 Standing on airex hitting volleyball Marching on airex   04/10/22 NuStep L5 x54mins Walking on beam Standing on airex feet together, tandem then with eyes closed 3-5s secs at best with eyes closed  Leg ext 10# 2x10 HS curls 20# 2x10  Leg press 20# 2x10 STS on airex 2x10   PATIENT EDUCATION: Education details: Pt educated on evaluation findings, discussed goals Person educated: Patient Education method: Explanation Education comprehension: verbalized understanding  HOME EXERCISE PROGRAM: [id number]  GOALS: Goals reviewed with patient? Yes  SHORT TERM GOALS: Target date: 05/01/2022   Patient will demo 30/30 on FGA to improve dynamic balance with functional mobility Baseline: 27/30 05/07/22, 28-4/11/24, 30/30-4/18/24 Goal status: MET  2.  Pt will demo <12 seconds with 5x sit to stand to improve functional strength with transfers. Baseline: 13.6 sec (no UE support)(04/03/22), 12.7s 05/07/22, 12s 06/05/22 Goal status: MET   LONG TERM GOALS: Target date: 06/13/2022  Pt will be I and compliant with HEP to self manage her symptoms. Baseline: HEP to issued in future sessions. Goal status: MET  2. Patient will be able to take 5 consecutive tandem steps   Baseline: 2 steps at best, 4-4/11/24, 10  steps- 06/12/22  Goal status: MET    ASSESSMENT:  CLINICAL IMPRESSION: Patient doing well. Continued to work on LE strength and balance. Some fatigue with STS and leg kicks today but overall doing well and can be d/c next visit.    OBJECTIVE IMPAIRMENTS: Abnormal gait, decreased balance, decreased endurance, difficulty walking, decreased strength, increased muscle spasms, postural dysfunction, and pain.   ACTIVITY LIMITATIONS: lifting, sitting, squatting, and transfers  PARTICIPATION LIMITATIONS: cleaning, shopping, and community activity  PERSONAL FACTORS: Age, Past/current experiences, and Time since onset of injury/illness/exacerbation are also affecting patient's functional outcome.   REHAB POTENTIAL: Excellent  CLINICAL DECISION MAKING: Stable/uncomplicated  EVALUATION COMPLEXITY: Low  PLAN:  PT FREQUENCY: 2x/week  PT DURATION: 6 weeks (8 sessions plus eval)  PLANNED INTERVENTIONS: Therapeutic exercises, Therapeutic activity, Neuromuscular re-education, Balance training, Gait training, Patient/Family education, Self Care, Joint mobilization, Joint manipulation, Stair training, Orthotic/Fit training, Spinal mobilization, Cryotherapy, Moist heat, Manual therapy, and Re-evaluation  PLAN FOR NEXT SESSION: d/c  PHYSICAL THERAPY DISCHARGE SUMMARY  Visits from Start of Care: 12   Patient agrees to discharge. Patient goals were met. Patient is being discharged due to meeting the stated rehab goals.    [place], PT 06/12/2022, 1:15 PM

## 2022-06-13 ENCOUNTER — Ambulatory Visit: Payer: Medicare HMO

## 2022-06-18 NOTE — Telephone Encounter (Signed)
Pt called. Stated she needs to talk to nurse about NCV.

## 2022-06-19 NOTE — Telephone Encounter (Signed)
I called the patient. She requested that the nerve conduction study and EMG be explained to her in better detail to ease anxiety surround the test. The steps of the procedure and purpose of the test was explained to her, along with instructions for test preparation. She verbalized understanding of the procedure and expressed appreciation for the call. All questions answered.

## 2022-06-30 ENCOUNTER — Ambulatory Visit
Admission: RE | Admit: 2022-06-30 | Discharge: 2022-06-30 | Disposition: A | Discharge status: Home / Self Care | Payer: Medicare HMO | Source: Ambulatory Visit | Attending: Neurology | Admitting: Neurology

## 2022-06-30 DIAGNOSIS — M4692 Unspecified inflammatory spondylopathy, cervical region: Secondary | ICD-10-CM

## 2022-06-30 DIAGNOSIS — R202 Paresthesia of skin: Secondary | ICD-10-CM

## 2022-06-30 DIAGNOSIS — R4189 Other symptoms and signs involving cognitive functions and awareness: Secondary | ICD-10-CM | POA: Diagnosis not present

## 2022-07-07 ENCOUNTER — Telehealth: Payer: Self-pay | Admitting: Neurology

## 2022-07-07 NOTE — Telephone Encounter (Signed)
Pt called. Stated she wants to let Dr. Terrace Arabia know her daughter will be in town on 5/16. Stated Dr. Terrace Arabia said something about talking with pt daughter.

## 2022-07-08 NOTE — Telephone Encounter (Signed)
Advised patient and her daughter to set up mychart account, if needed, may set up a virtual visit to discuss her case.

## 2022-07-09 DIAGNOSIS — I10 Essential (primary) hypertension: Secondary | ICD-10-CM | POA: Insufficient documentation

## 2022-07-09 NOTE — Progress Notes (Deleted)
  Cardiology Office Note:   Date:  07/09/2022  ID:  Paige Pena, DOB [birth year], MRN [id number]  History of Present Illness:   Paige Pena is a 82 y.o. female who presents for evaluation of palpitations.    She has had some TIAs or strokes  Echo in 2017 as part of a neurology work up  demonstrated no significant abnormalities.      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  Her daughter was on the phone from [place].  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   The patient says she is been getting palpitations for about a month.  These are happening in the mid chest.  She will feel it skipping or fluttering or racing which cannot really quantify or qualify.  It happens more at bedtime.  She cannot bring it on with activity.  She has not been doing as much activity right now because she is having vision problems.  She has not had any presyncope or syncope.  She is not having any chest pressure, neck or arm discomfort.  She is not having any shortness of breath, PND or orthopnea.  She had no other cardiac work-up.  She has been doing some physical therapy because she pulled a muscle in her chest.  ROS: ***  Studies Reviewed:    EKG:  ***  ***  Risk Assessment/Calculations:   {Does this patient have ATRIAL FIBRILLATION?:[phone]} No BP recorded.  {Refresh Note OR Click here to enter BP  :1}***        Physical Exam:   VS:  There were no vitals taken for this visit.   Wt Readings from Last 3 Encounters:  05/29/22 118 lb 9.6 oz (53.8 kg)  05/08/22 118 lb 8 oz (53.8 kg)  05/01/22 121 lb (54.9 kg)     GEN: Well nourished, well developed in no acute distress NECK: No JVD; No carotid bruits CARDIAC: ***RRR, no murmurs, rubs, gallops RESPIRATORY:  Clear to auscultation without rales, wheezing or rhonchi  ABDOMEN: Soft, non-tender, non-distended EXTREMITIES:  No edema;  No deformity   ASSESSMENT AND PLAN:   Palpitations:   ***  Her exam is unremarkable.  Echo was unremarkable years ago.  I am going to start with a 4-week monitor which she wants to put on after she has some further management of her ongoing eye issues.  She will let us know when she wants to apply that.  I will see her back afterwards.  I do note that blood work was normal including TSH and electrolytes.  I suspect she is having PACs or PVCs but need to rule out atrial fibrillation.   Hypertension blood pressure:  ***  was upper limits of acceptable.  I am going to have her keep a blood pressure diary.    {Are you ordering a CV Procedure (e.g. stress test, cath, DCCV, TEE, etc)?   Press F2        :[id number]}   Signed, Rollene Rotunda, MD

## 2022-07-10 ENCOUNTER — Ambulatory Visit: Payer: Medicare HMO | Admitting: Cardiology

## 2022-07-10 DIAGNOSIS — I10 Essential (primary) hypertension: Secondary | ICD-10-CM

## 2022-07-10 DIAGNOSIS — R002 Palpitations: Secondary | ICD-10-CM

## 2022-07-14 ENCOUNTER — Emergency Department (HOSPITAL_BASED_OUTPATIENT_CLINIC_OR_DEPARTMENT_OTHER): Payer: Medicare HMO

## 2022-07-14 ENCOUNTER — Emergency Department (HOSPITAL_BASED_OUTPATIENT_CLINIC_OR_DEPARTMENT_OTHER)
Admission: EM | Admit: 2022-07-14 | Discharge: 2022-07-15 | Disposition: A | Discharge status: Home / Self Care | Payer: Medicare HMO | Attending: Emergency Medicine | Admitting: Emergency Medicine

## 2022-07-14 ENCOUNTER — Encounter (HOSPITAL_BASED_OUTPATIENT_CLINIC_OR_DEPARTMENT_OTHER): Payer: Self-pay | Admitting: Emergency Medicine

## 2022-07-14 ENCOUNTER — Encounter: Payer: Medicare HMO | Admitting: Orthopedic Surgery

## 2022-07-14 ENCOUNTER — Emergency Department (HOSPITAL_COMMUNITY)
Admission: EM | Admit: 2022-07-14 | Discharge: 2022-07-14 | Discharge status: Against Medical Advice | Payer: Medicare HMO

## 2022-07-14 ENCOUNTER — Telehealth: Payer: Self-pay | Admitting: Neurology

## 2022-07-14 ENCOUNTER — Telehealth: Payer: Self-pay | Admitting: Orthopedic Surgery

## 2022-07-14 ENCOUNTER — Other Ambulatory Visit: Payer: Self-pay | Admitting: Orthopedic Surgery

## 2022-07-14 ENCOUNTER — Other Ambulatory Visit: Payer: Self-pay

## 2022-07-14 DIAGNOSIS — Z8673 Personal history of transient ischemic attack (TIA), and cerebral infarction without residual deficits: Secondary | ICD-10-CM | POA: Diagnosis not present

## 2022-07-14 DIAGNOSIS — W19XXXA Unspecified fall, initial encounter: Secondary | ICD-10-CM

## 2022-07-14 DIAGNOSIS — M542 Cervicalgia: Secondary | ICD-10-CM | POA: Diagnosis not present

## 2022-07-14 DIAGNOSIS — S0990XA Unspecified injury of head, initial encounter: Secondary | ICD-10-CM | POA: Diagnosis not present

## 2022-07-14 DIAGNOSIS — S161XXA Strain of muscle, fascia and tendon at neck level, initial encounter: Secondary | ICD-10-CM

## 2022-07-14 DIAGNOSIS — I10 Essential (primary) hypertension: Secondary | ICD-10-CM | POA: Insufficient documentation

## 2022-07-14 DIAGNOSIS — S199XXA Unspecified injury of neck, initial encounter: Secondary | ICD-10-CM | POA: Diagnosis not present

## 2022-07-14 DIAGNOSIS — Z79899 Other long term (current) drug therapy: Secondary | ICD-10-CM | POA: Diagnosis not present

## 2022-07-14 DIAGNOSIS — M25512 Pain in left shoulder: Secondary | ICD-10-CM | POA: Insufficient documentation

## 2022-07-14 DIAGNOSIS — S40012A Contusion of left shoulder, initial encounter: Secondary | ICD-10-CM

## 2022-07-14 DIAGNOSIS — W06XXXA Fall from bed, initial encounter: Secondary | ICD-10-CM | POA: Insufficient documentation

## 2022-07-14 DIAGNOSIS — Z7982 Long term (current) use of aspirin: Secondary | ICD-10-CM | POA: Diagnosis not present

## 2022-07-14 DIAGNOSIS — R2681 Unsteadiness on feet: Secondary | ICD-10-CM

## 2022-07-14 DIAGNOSIS — R4189 Other symptoms and signs involving cognitive functions and awareness: Secondary | ICD-10-CM

## 2022-07-14 DIAGNOSIS — R519 Headache, unspecified: Secondary | ICD-10-CM | POA: Diagnosis not present

## 2022-07-14 NOTE — Progress Notes (Unsigned)
Careteam: Patient Care Team: [name], NP as PCP - General (Adult Health Nurse Practitioner) Cherlyn Roberts, MD as Consulting Physician (Dermatology) Holli Humbles, MD as Referring Physician (Ophthalmology) Butch Penny, NP as Nurse Practitioner (Neurology) Noel Christmas, MD as Consulting Physician (Urology)  Seen by: Hazle Nordmann, AGNP-C  PLACE OF SERVICE:  [facility]  Advanced Directive information    Allergies  Allergen Reactions  . Morphine Other (See Comments)    REACTION: lethargy/malaise  . Metoclopramide Hcl Anxiety and Other (See Comments)    REACTION: anxious and out of her mind  . Aricept [Donepezil Hcl]     nausea  . Doxycycline Diarrhea  . Penicillins Itching and Rash    Has patient had a PCN reaction causing immediate rash, facial/tongue/throat swelling, SOB or lightheadedness with hypotension: No Has patient had a PCN reaction causing severe rash involving mucus membranes or skin necrosis: No Has patient had a PCN reaction that required hospitalization No Has patient had a PCN reaction occurring within the last 10 years: Yes If all of the above answers are "NO", then may proceed with Cephalosporin use.    . Prednisone Itching and Rash  . Sulfonamide Derivatives Itching and Rash    Chief Complaint  Patient presents with  . Acute Visit    Patient complains of having a fall, and wanting to be examined.   . Error     HPI: Patient is a 82 y.o. female seen today for acute visit due to recent fall.     Review of Systems:  ROS***  Past Medical History:  Diagnosis Date  . Acute cystitis   . Anxiety   . Aphasia    transsient aphasia Jan 2012  . Asthmatic bronchitis   . Cerebrovascular disease   . Diverticulosis of colon   . DJD (degenerative joint disease)   . Dysphonia   . Esophageal stricture   . Fibromyalgia   . Gastroparesis   . GERD (gastroesophageal reflux disease)   . Hematuria, microscopic   . Hemorrhoids   .  Hypercholesteremia   . IBS (irritable bowel syndrome)   . Memory deficits 02/15/2013  . RLS (restless legs syndrome)   . TIA (transient ischemic attack)   . Urinary incontinence   . Vitamin D deficiency    Past Surgical History:  Procedure Laterality Date  . ABDOMINAL HYSTERECTOMY  02/24/1993  . cataract surgery Right 12/19/2021  . corneal transplant for keratonconus    . EYE SURGERY  02/25/2011  . left foot fracture with ORIF  07/25/2004   Dr. Lajoyce Corners  . right bunion surgery     Social History:   reports that she has never smoked. She has never used smokeless tobacco. She reports current alcohol use. She reports that she does not use drugs.  Family History  Problem Relation Age of Onset  . Hypertension Mother   . Anemia Mother   . Dementia Mother   . Kidney disease Father   . Hyperlipidemia Sister   . Diabetes Brother   . Lung cancer Brother   . Dementia Maternal Grandmother   . Diabetes Other        aunt,brother,uncle  . Breast cancer Maternal Aunt     Medications: Patient's Medications  New Prescriptions   No medications on file  Previous Medications   ASPIRIN 325 MG EC TABLET    Take 325 mg by mouth daily.   ATORVASTATIN (LIPITOR) 20 MG TABLET    Take 1 tablet (20 mg total)  by mouth every Monday, Wednesday, and Friday.   BIOTIN 1000 MCG TABLET    Take 1,000 mcg by mouth 3 (three) times daily.   CHOLECALCIFEROL (VITAMIN D) 50 MCG (2000 UT) CAPS    Take by mouth.   COLLAGEN-VITAMIN C PO    Take by mouth.   CYANOCOBALAMIN (VITAMIN B12) 1000 MCG TABLET    Take 1 tablet (1,000 mcg total) by mouth daily.   DULOXETINE (CYMBALTA) 60 MG CAPSULE    Take 1 capsule (60 mg total) by mouth daily.   FLUTICASONE (FLONASE) 50 MCG/ACT NASAL SPRAY    Place 2 sprays into both nostrils daily as needed for allergies.   HYDROXYZINE (VISTARIL) 25 MG CAPSULE    Take 1 capsule (25 mg total) by mouth daily as needed. For panic attack or increased anxiety   OMEGA-3 FATTY ACIDS (FISH OIL) 1000  MG CAPS    Take by mouth.   OMEPRAZOLE (PRILOSEC) 40 MG CAPSULE    TAKE 1 CAPSULE TWICE DAILY   PREDNISOLONE ACETATE (PRED FORTE) 1 % OPHTHALMIC SUSPENSION    Place 1 drop into both eyes as directed.  Modified Medications   No medications on file  Discontinued Medications   No medications on file    Physical Exam:  There were no vitals filed for this visit. There is no height or weight on file to calculate BMI. Wt Readings from Last 3 Encounters:  07/14/22 121 lb (54.9 kg)  05/29/22 118 lb 9.6 oz (53.8 kg)  05/08/22 118 lb 8 oz (53.8 kg)    Physical Exam***  Labs reviewed: Basic Metabolic Panel: Recent Labs    07/25/21 1446 04/18/22 1613 05/22/22 0928  NA 139  --  143  K 3.8  --  4.0  CL 102  --  104  CO2 30  --  30  GLUCOSE 83  --  103*  BUN 20  --  13  CREATININE 0.79  --  0.90  CALCIUM 9.4  --  9.2  TSH 1.30 1.07  --     Liver Function Tests: Recent Labs    07/25/21 1446 05/22/22 0928  AST 21 20  ALT 11 14  ALKPHOS 83  --   BILITOT 0.5 0.4  PROT 7.0 6.7  ALBUMIN 4.1  --     No results for input(s): "LIPASE", "AMYLASE" in the last 8760 hours. No results for input(s): "AMMONIA" in the last 8760 hours. CBC: Recent Labs    07/25/21 1446 05/22/22 0928  WBC 4.6 3.5*  NEUTROABS 3.2 1,383*  HGB 13.2 12.9  HCT 39.7 38.8  MCV 90.6 90.4  PLT 158.0 171    Lipid Panel: Recent Labs    07/25/21 1446 05/22/22 0928  CHOL 228* 168  HDL 78.10 89  LDLCALC 137* 63  TRIG 65.0 77  CHOLHDL 3 1.9    TSH: Recent Labs    07/25/21 1446 04/18/22 1613  TSH 1.30 1.07    A1C: Lab Results  Component Value Date   HGBA1C 5.7 (H) 05/29/2022     Assessment/Plan There are no diagnoses linked to this encounter.  Next appt: *** Kimsey Demaree Scherry Ran  [facility] [phone]  This encounter was created in error - please disregard.

## 2022-07-14 NOTE — ED Notes (Signed)
Ambulates to bathroom tolerated well. Daughter at bedside

## 2022-07-14 NOTE — ED Notes (Signed)
Pt left hospital, did not want to wait

## 2022-07-14 NOTE — ED Triage Notes (Signed)
Pt arrives pov, steady gait, endorses fall from bed x 3 days pta and hitting head. Pt denies pain, states "I feel a sensation". Denies n/v or dizziness or anti coags. Also reports "stiff neck on left side" PT aox4, VAN-

## 2022-07-14 NOTE — ED Notes (Signed)
Patient to CT via Stretcher

## 2022-07-14 NOTE — ED Provider Notes (Signed)
[place] EMERGENCY DEPARTMENT AT [facility] Provider Note   CSN: [id number] Arrival date & time: 07/14/22  1801     History {Add pertinent medical, surgical, social history, OB history to HPI:1} Chief Complaint  Patient presents with   Zyanah Brightwell is a 82 y.o. female.  Patient is an 82 year old female with past medical history of fibromyalgia, prior TIA, hyperlipidemia, hypertension.  Patient presenting today with complaints of a fall.  She was sleeping 2 nights ago when her telephone rang.  She reached out to pick her phone up which she normally places on a stool beside her bed.  The stool had been removed and patient continued to reach, then fell out of bed.  She struck her head on the floor.  She complains of her head "not feeling quite right" and also discomfort to the left side of her neck and shoulder.  She denies any weakness or numbness.  There was no loss of consciousness.  She denies any injury to the chest, abdomen, or pelvis.  Patient does not take blood thinners.  The history is provided by the patient.       Home Medications Prior to Admission medications   Medication Sig Start Date End Date Taking? Authorizing Provider  aspirin 325 MG EC tablet Take 325 mg by mouth daily.    [provider]  atorvastatin (LIPITOR) 20 MG tablet Take 1 tablet (20 mg total) by mouth every Monday, Wednesday, and Friday. 04/18/22   Mahlon Gammon, MD  Biotin 1000 MCG tablet Take 1,000 mcg by mouth 3 (three) times daily.    [provider]  Cholecalciferol (VITAMIN D) 50 MCG (2000 UT) CAPS Take by mouth.    [provider]  COLLAGEN-VITAMIN C PO Take by mouth.    [provider]  cyanocobalamin (VITAMIN B12) 1000 MCG tablet Take 1 tablet (1,000 mcg total) by mouth daily. 12/06/21   Medina-Vargas, Monina C, NP  DULoxetine (CYMBALTA) 60 MG capsule Take 1 capsule (60 mg total) by mouth daily. 12/20/21   Fargo, Amy E, NP  fluticasone  (FLONASE) 50 MCG/ACT nasal spray Place 2 sprays into both nostrils daily as needed for allergies. 12/01/18   Sheliah Hatch, MD  hydrOXYzine (VISTARIL) 25 MG capsule Take 1 capsule (25 mg total) by mouth daily as needed. For panic attack or increased anxiety 11/14/21   Fargo, Amy E, NP  Omega-3 Fatty Acids (FISH OIL) 1000 MG CAPS Take by mouth.    [provider]  omeprazole (PRILOSEC) 40 MG capsule TAKE 1 CAPSULE TWICE DAILY 12/20/21   Fargo, Amy E, NP  prednisoLONE acetate (PRED FORTE) 1 % ophthalmic suspension Place 1 drop into both eyes as directed. 06/27/21   [provider]      Allergies    Morphine, Metoclopramide hcl, Aricept [donepezil hcl], Doxycycline, Penicillins, Prednisone, and Sulfonamide derivatives    Review of Systems   Review of Systems  All other systems reviewed and are negative.   Physical Exam Updated Vital Signs BP (!) 166/91   Pulse 65   Temp 98.4 F (36.9 C) (Oral)   Resp 18   Wt 54.9 kg   SpO2 92%   BMI 22.86 kg/m  Physical Exam Vitals and nursing note reviewed.  Constitutional:      General: She is not in acute distress.    Appearance: She is well-developed. She is not diaphoretic.  HENT:     Head: Normocephalic and atraumatic.  Eyes:  Extraocular Movements: Extraocular movements intact.     Pupils: Pupils are equal, round, and reactive to light.  Neck:     Comments: There is mild tenderness in the soft tissues of the left lateral neck extending into the trapezius. Cardiovascular:     Rate and Rhythm: Normal rate and regular rhythm.     Heart sounds: No murmur heard.    No friction rub. No gallop.  Pulmonary:     Effort: Pulmonary effort is normal. No respiratory distress.     Breath sounds: Normal breath sounds. No wheezing.  Abdominal:     General: Bowel sounds are normal. There is no distension.     Palpations: Abdomen is soft.     Tenderness: There is no abdominal tenderness.  Musculoskeletal:        General:  Normal range of motion.     Cervical back: Normal range of motion and neck supple.  Skin:    General: Skin is warm and dry.  Neurological:     General: No focal deficit present.     Mental Status: She is alert and oriented to person, place, and time. Mental status is at baseline.     Cranial Nerves: No cranial nerve deficit.     Sensory: No sensory deficit.     Motor: No weakness.     ED Results / Procedures / Treatments   Labs (all labs ordered are listed, but only abnormal results are displayed) Labs Reviewed - No data to display  EKG None  Radiology No results found.  Procedures Procedures  {Document cardiac monitor, telemetry assessment procedure when appropriate:1}  Medications Ordered in ED Medications - No data to display  ED Course/ Medical Decision Making/ A&P   {   Click here for ABCD2, HEART and other calculatorsREFRESH Note before signing :1}                          Medical Decision Making Amount and/or Complexity of Data Reviewed Radiology: ordered.   ***  {Document critical care time when appropriate:1} {Document review of labs and clinical decision tools ie heart score, Chads2Vasc2 etc:1}  {Document your independent review of radiology images, and any outside records:1} {Document your discussion with family members, caretakers, and with consultants:1} {Document social determinants of health affecting pt's care:1} {Document your decision making why or why not admission, treatments were needed:1} Final Clinical Impression(s) / ED Diagnoses Final diagnoses:  None    Rx / DC Orders ED Discharge Orders     None

## 2022-07-14 NOTE — Telephone Encounter (Signed)
Discussed concerns with daughter [name] via telephone. 05/18 she rolled out of bed. She could not remember if she hit her head. She denied pain after incident. No changes in consciousness, headaches, N/V, wound or bum to head. Family made appointment in office to seen. She missed appointment. She was rescheduled for 11 am 05/21. On the way home, she began to c/o mild neck pain. Daughter also thought neck may be swollen. She was advised to report to ED for evalutation. She had eye surgery 11/2021 and reports vision and balance issues ever since. Poor vision was also exacerbated by wearing new contact lenses. Will make referral to PT today. Daughter also requested neurology referral. Last, she has had increased anxiety at times. No panic attacks. She is on Cymbalta and hydroxyzine prn. Recommended [facility] for consider therapy. Daughter in agreement with current plan. Mychart messages also mentioned as form of communications. She was very thankful for call.

## 2022-07-14 NOTE — Telephone Encounter (Signed)
Pt daughter called. Stated pt fell out of bed trying to reach for phone. She said she would like a follow-up appointment with Dr. Terrace Arabia so pt can be checked out. Stated she is flying back home tomorrow at 6 am.

## 2022-07-15 ENCOUNTER — Telehealth: Payer: Self-pay | Admitting: Orthopedic Surgery

## 2022-07-15 ENCOUNTER — Ambulatory Visit: Payer: Medicare HMO | Admitting: Nurse Practitioner

## 2022-07-15 NOTE — Telephone Encounter (Signed)
Patient returned from ED last night. Daughter Paige Pena reports mild left shoulder and wrist pain this morning. Imaging studies to head, neck and shoulder negative for acute abnormality. Discussed tylenol or ibuprofen for shoulder pain. Advised to take ibuprofen with food. Also discussed topical voltaren gel. She is scheduled for follow up 05/23 at 1pm at [facility].

## 2022-07-15 NOTE — Discharge Instructions (Signed)
Take ibuprofen 400 mg every 6 hours as needed for pain.  Rest.  Return to the emergency department if you develop worsening pain, severe headache, seizures/convulsions, or for other new and concerning symptoms.

## 2022-07-16 NOTE — Progress Notes (Unsigned)
  Cardiology Office Note:   Date:  07/17/2022  ID:  Paige Pena, DOB October 26, 1940, MRN [id number]  History of Present Illness:   Paige Pena is a 82 y.o. female who presents for evaluation of palpitations.    She has had some TIAs or strokes  Echo in 2017 as part of a neurology work up  demonstrated no significant abnormalities.   She had some palpitations after the last visit and I put a monitor on her and there were some tachycardia episodes agreement atrial tachycardia versus very short runs of atrial flutter.  Since then she has had fewer palpitations and actually feels well.  She has not had any presyncope or syncope.  She unfortunately had a fall out of bed the other day and hit her head.  Head CT was unremarkable.  MRI is pending.  She is felt little dizzy but she has not had any other symptoms related to this.  She has had no new chest pressure, neck or arm discomfort.  She has been limiting her activities because of her vision problems.   ROS: As stated in the HPI and negative for all other systems.  Studies Reviewed:    EKG:  NA    Risk Assessment/Calculations:              Physical Exam:   VS:  BP 122/60 (BP Location: Right Arm, Patient Position: Sitting, Cuff Size: Normal)   Pulse 69   Ht 5\' 1"  (1.549 m)   Wt 115 lb 12.8 oz (52.5 kg)   SpO2 97%   BMI 21.88 kg/m    Wt Readings from Last 3 Encounters:  07/17/22 115 lb 12.8 oz (52.5 kg)  07/17/22 114 lb 12.8 oz (52.1 kg)  07/14/22 121 lb (54.9 kg)     GEN: Well nourished, well developed in no acute distress NECK: No JVD; No carotid bruits CARDIAC: RRR, no murmurs, rubs, gallops RESPIRATORY:  Clear to auscultation without rales, wheezing or rhonchi  ABDOMEN: Soft, non-tender, non-distended EXTREMITIES:  No edema; No deformity   ASSESSMENT AND PLAN:   Palpitations:   She is not having any further palpitations.  She had some rare supraventricular arrhythmias noted but in the absence of more sustained arrhythmias,  clearly documented flutter or fibrillation no further therapy is indicated.  I had a long conversation with her and her daughter who was in [place] about this.  She would let me know if she has any increasing palpitations.  At that point I would maybe reapply a monitor but I also discussed that getting a wearable such as Apple Watch.  Hypertension blood pressure: Blood pressure is well-controlled.  No change in therapy.        Signed, Rollene Rotunda, MD

## 2022-07-17 ENCOUNTER — Telehealth: Payer: Self-pay

## 2022-07-17 ENCOUNTER — Encounter: Payer: Self-pay | Admitting: Cardiology

## 2022-07-17 ENCOUNTER — Ambulatory Visit (INDEPENDENT_AMBULATORY_CARE_PROVIDER_SITE_OTHER): Payer: Medicare HMO | Admitting: Orthopedic Surgery

## 2022-07-17 ENCOUNTER — Encounter: Payer: Self-pay | Admitting: Orthopedic Surgery

## 2022-07-17 ENCOUNTER — Telehealth (INDEPENDENT_AMBULATORY_CARE_PROVIDER_SITE_OTHER): Payer: Medicare HMO | Admitting: Student

## 2022-07-17 ENCOUNTER — Other Ambulatory Visit: Payer: Self-pay | Admitting: Orthopedic Surgery

## 2022-07-17 ENCOUNTER — Ambulatory Visit: Payer: Medicare HMO | Admitting: Cardiology

## 2022-07-17 ENCOUNTER — Ambulatory Visit: Payer: Medicare HMO | Admitting: Family

## 2022-07-17 ENCOUNTER — Ambulatory Visit
Admission: RE | Admit: 2022-07-17 | Discharge: 2022-07-17 | Disposition: A | Discharge status: Home / Self Care | Payer: Medicare HMO | Source: Ambulatory Visit | Attending: Orthopedic Surgery | Admitting: Orthopedic Surgery

## 2022-07-17 VITALS — BP 132/74 | HR 72 | Temp 98.2°F | Ht 61.0 in | Wt 114.8 lb

## 2022-07-17 VITALS — BP 122/60 | HR 69 | Ht 61.0 in | Wt 115.8 lb

## 2022-07-17 DIAGNOSIS — H6123 Impacted cerumen, bilateral: Secondary | ICD-10-CM | POA: Diagnosis not present

## 2022-07-17 DIAGNOSIS — R42 Dizziness and giddiness: Secondary | ICD-10-CM

## 2022-07-17 DIAGNOSIS — S0990XA Unspecified injury of head, initial encounter: Secondary | ICD-10-CM

## 2022-07-17 DIAGNOSIS — R002 Palpitations: Secondary | ICD-10-CM

## 2022-07-17 DIAGNOSIS — I679 Cerebrovascular disease, unspecified: Secondary | ICD-10-CM

## 2022-07-17 DIAGNOSIS — R5383 Other fatigue: Secondary | ICD-10-CM

## 2022-07-17 DIAGNOSIS — M25512 Pain in left shoulder: Secondary | ICD-10-CM

## 2022-07-17 DIAGNOSIS — I10 Essential (primary) hypertension: Secondary | ICD-10-CM | POA: Diagnosis not present

## 2022-07-17 LAB — CBC WITH DIFFERENTIAL/PLATELET
Absolute Monocytes: 506 cells/uL (ref 200–950)
Basophils Relative: 0.5 %
HCT: 38.4 % (ref 35.0–45.0)
Hemoglobin: 12.9 g/dL (ref 11.7–15.5)
Lymphs Abs: 1016 cells/uL (ref 850–3900)
MCH: 30.4 pg (ref 27.0–33.0)
MCV: 90.6 fL (ref 80.0–100.0)
Total Lymphocyte: 23.1 %

## 2022-07-17 NOTE — Patient Instructions (Signed)
    Follow-Up: At [facility], you and your health needs are our priority.  As part of our continuing mission to provide you with exceptional heart care, we have created designated Provider Care Teams.  These Care Teams include your primary Cardiologist (physician) and Advanced Practice Providers (APPs -  Physician Assistants and Nurse Practitioners) who all work together to provide you with the care you need, when you need it.  We recommend signing up for the patient portal called "MyChart".  Sign up information is provided on this After Visit Summary.  MyChart is used to connect with patients for Virtual Visits (Telemedicine).  Patients are able to view lab/test results, encounter notes, upcoming appointments, etc.  Non-urgent messages can be sent to your provider as well.   To learn more about what you can do with MyChart, go to [email or web address].    Your next appointment:   12 month(s)  Provider:   James Hochrein MD   

## 2022-07-17 NOTE — Telephone Encounter (Signed)
Will forward message to PCP [facility] Trecia Rogers

## 2022-07-17 NOTE — Telephone Encounter (Signed)
Only 2 referrals where placed on 5/20 Physical therapy and Neurology. Both where placed as Routine not stat and pt has been sent a mychart letters with the office information.

## 2022-07-17 NOTE — Progress Notes (Signed)
Careteam: Patient Care Team: [name], NP as PCP - General (Adult Health Nurse Practitioner) Cherlyn Roberts, MD as Consulting Physician (Dermatology) Holli Humbles, MD as Referring Physician (Ophthalmology) Butch Penny, NP as Nurse Practitioner (Neurology) Noel Christmas, MD as Consulting Physician (Urology)  Seen by: Hazle Nordmann, AGNP-C  PLACE OF SERVICE:  [facility]  Advanced Directive information Does Patient Have a Medical Advance Directive?: No, Would patient like information on creating a medical advance directive?: No - Patient declined  Allergies  Allergen Reactions   Morphine Other (See Comments)    REACTION: lethargy/malaise   Metoclopramide Hcl Anxiety and Other (See Comments)    REACTION: anxious and out of her mind   Aricept [Donepezil Hcl]     nausea   Doxycycline Diarrhea   Penicillins Itching and Rash    Has patient had a PCN reaction causing immediate rash, facial/tongue/throat swelling, SOB or lightheadedness with hypotension: No Has patient had a PCN reaction causing severe rash involving mucus membranes or skin necrosis: No Has patient had a PCN reaction that required hospitalization No Has patient had a PCN reaction occurring within the last 10 years: Yes If all of the above answers are "NO", then may proceed with Cephalosporin use.     Prednisone Itching and Rash   Sulfonamide Derivatives Itching and Rash    Chief Complaint  Patient presents with   Transitions Of Care    TOC 07/14/2022 for fall. Fell Saturday.      HPI: Patient is a 82 y.o. female seen today s/p ED visit 05/20 due to fall.   05/18 family reports she rolled out of bed. She was unsure if she hit her head. Daughter took her to the ED since left shoulder appeared swollen. CT head/ spine negative for intracranial pathology, no acute cervical spine injury. About 2 days ago, she started feeling "odd sensation" to back of head. She described it as lightheadedness. She  also reports feeling more tired. 04/04 encounter she admitted to increased fatigued. TSH 1.07 02/23. WBC 3.5, hgb 12.9, hct 38.8, mcv 90.4, platelets 171 05/22/2022.  Orthostatic blood pressures  Laying> 122/72, sitting 118/64, standing 112/64  Also having issues with vision after cataract surgery 11/2021. She is now wearing lens to help with vision, followed by Dr. Valere Dross.   Does not always drink water well.  She used voltaren gel on left shoulder and swelling subsided. Denies pain today.   She denies headaches, nausea, vomiting, weakness, chest pain or sob. She is scheduled to have MRI brain later on today at [facility].    Review of Systems:  Review of Systems  Constitutional:  Positive for malaise/fatigue. Negative for fever.  HENT:  Positive for tinnitus. Negative for congestion, hearing loss and sinus pain.   Eyes:  Positive for blurred vision.  Respiratory:  Negative for cough, shortness of breath and wheezing.   Cardiovascular:  Negative for chest pain and leg swelling.  Gastrointestinal:  Negative for abdominal pain.  Genitourinary:  Negative for dysuria.  Musculoskeletal:  Positive for falls. Negative for joint pain.  Neurological:  Positive for dizziness. Negative for weakness and headaches.  Psychiatric/Behavioral:  Positive for depression and memory loss. The patient is nervous/anxious. The patient does not have insomnia.     Past Medical History:  Diagnosis Date   Acute cystitis    Anxiety    Aphasia    transsient aphasia Jan 2012   Asthmatic bronchitis    Cerebrovascular disease    Diverticulosis of  colon    DJD (degenerative joint disease)    Dysphonia    Esophageal stricture    Fibromyalgia    Gastroparesis    GERD (gastroesophageal reflux disease)    Hematuria, microscopic    Hemorrhoids    Hypercholesteremia    IBS (irritable bowel syndrome)    Memory deficits 02/15/2013   RLS (restless legs syndrome)    TIA (transient ischemic attack)     Urinary incontinence    Vitamin D deficiency    Past Surgical History:  Procedure Laterality Date   ABDOMINAL HYSTERECTOMY  02/24/1993   cataract surgery Right 12/19/2021   corneal transplant for keratonconus     EYE SURGERY  02/25/2011   left foot fracture with ORIF  07/25/2004   Dr. Lajoyce Corners   right bunion surgery     Social History:   reports that she has never smoked. She has never used smokeless tobacco. She reports current alcohol use. She reports that she does not use drugs.  Family History  Problem Relation Age of Onset   Hypertension Mother    Anemia Mother    Dementia Mother    Kidney disease Father    Hyperlipidemia Sister    Diabetes Brother    Lung cancer Brother    Dementia Maternal Grandmother    Diabetes Other        aunt,brother,uncle   Breast cancer Maternal Aunt     Medications: Patient's Medications  New Prescriptions   No medications on file  Previous Medications   ASPIRIN 325 MG EC TABLET    Take 325 mg by mouth daily.   ATORVASTATIN (LIPITOR) 20 MG TABLET    Take 1 tablet (20 mg total) by mouth every Monday, Wednesday, and Friday.   BIOTIN 1000 MCG TABLET    Take 1,000 mcg by mouth 3 (three) times daily.   CHOLECALCIFEROL (VITAMIN D) 50 MCG (2000 UT) CAPS    Take by mouth.   COLLAGEN-VITAMIN C PO    Take by mouth.   CYANOCOBALAMIN (VITAMIN B12) 1000 MCG TABLET    Take 1 tablet (1,000 mcg total) by mouth daily.   DULOXETINE (CYMBALTA) 60 MG CAPSULE    Take 1 capsule (60 mg total) by mouth daily.   FLUTICASONE (FLONASE) 50 MCG/ACT NASAL SPRAY    Place 2 sprays into both nostrils daily as needed for allergies.   HYDROXYZINE (VISTARIL) 25 MG CAPSULE    Take 1 capsule (25 mg total) by mouth daily as needed. For panic attack or increased anxiety   OMEGA-3 FATTY ACIDS (FISH OIL) 1000 MG CAPS    Take by mouth.   OMEPRAZOLE (PRILOSEC) 40 MG CAPSULE    TAKE 1 CAPSULE TWICE DAILY   PREDNISOLONE ACETATE (PRED FORTE) 1 % OPHTHALMIC SUSPENSION    Place 1 drop into  both eyes as directed.  Modified Medications   No medications on file  Discontinued Medications   No medications on file    Physical Exam:  Vitals:   07/17/22 1247  BP: 132/74  Pulse: 72  Temp: 98.2 F (36.8 C)  SpO2: 96%  Weight: 114 lb 12.8 oz (52.1 kg)  Height: 5\' 1"  (1.549 m)   Body mass index is 21.69 kg/m. Wt Readings from Last 3 Encounters:  07/17/22 114 lb 12.8 oz (52.1 kg)  07/14/22 121 lb (54.9 kg)  05/29/22 118 lb 9.6 oz (53.8 kg)    Physical Exam Vitals reviewed.  Constitutional:      General: She is not in acute distress. HENT:  Head: Normocephalic and atraumatic. No raccoon eyes, Battle's sign, abrasion or laceration. Hair is normal.     Right Ear: There is impacted cerumen.     Left Ear: There is impacted cerumen.     Nose: Nose normal.     Mouth/Throat:     Mouth: Mucous membranes are moist.  Eyes:     General:        Right eye: No discharge.        Left eye: No discharge.     Extraocular Movements: Extraocular movements intact.     Right eye: Normal extraocular motion and no nystagmus.     Left eye: Normal extraocular motion and no nystagmus.     Pupils: Pupils are equal, round, and reactive to light.  Cardiovascular:     Rate and Rhythm: Normal rate and regular rhythm.     Pulses: Normal pulses.     Heart sounds: Normal heart sounds.  Pulmonary:     Effort: Pulmonary effort is normal. No respiratory distress.     Breath sounds: Normal breath sounds. No wheezing.  Abdominal:     General: Bowel sounds are normal. There is no distension.     Palpations: Abdomen is soft.     Tenderness: There is no abdominal tenderness.  Musculoskeletal:     Cervical back: Neck supple.     Right lower leg: No edema.     Left lower leg: No edema.  Skin:    General: Skin is warm.     Capillary Refill: Capillary refill takes less than 2 seconds.  Neurological:     General: No focal deficit present.     Mental Status: She is alert and oriented to person,  place, and time.     Motor: No weakness.     Gait: Gait normal.  Psychiatric:        Mood and Affect: Mood normal.        Behavior: Behavior normal.     Labs reviewed: Basic Metabolic Panel: Recent Labs    07/25/21 1446 04/18/22 1613 05/22/22 0928  NA 139  --  143  K 3.8  --  4.0  CL 102  --  104  CO2 30  --  30  GLUCOSE 83  --  103*  BUN 20  --  13  CREATININE 0.79  --  0.90  CALCIUM 9.4  --  9.2  TSH 1.30 1.07  --    Liver Function Tests: Recent Labs    07/25/21 1446 05/22/22 0928  AST 21 20  ALT 11 14  ALKPHOS 83  --   BILITOT 0.5 0.4  PROT 7.0 6.7  ALBUMIN 4.1  --    No results for input(s): "LIPASE", "AMYLASE" in the last 8760 hours. No results for input(s): "AMMONIA" in the last 8760 hours. CBC: Recent Labs    07/25/21 1446 05/22/22 0928  WBC 4.6 3.5*  NEUTROABS 3.2 1,383*  HGB 13.2 12.9  HCT 39.7 38.8  MCV 90.6 90.4  PLT 158.0 171   Lipid Panel: Recent Labs    07/25/21 1446 05/22/22 0928  CHOL 228* 168  HDL 78.10 89  LDLCALC 137* 63  TRIG 65.0 77  CHOLHDL 3 1.9   TSH: Recent Labs    07/25/21 1446 04/18/22 1613  TSH 1.30 1.07   A1C: Lab Results  Component Value Date   HGBA1C 5.7 (H) 05/29/2022     Assessment/Plan: 1. Lightheadedness - 05/18 rolled out of bed, 05/21 lightheaded - orthostatic blood pressures  normal - ? Vertigo> neuro exam normal - CBC with Differential/Platelet - Complete Metabolic Panel with eGFR - discussed adequate water drinking - if symptoms continue recommend seeing Neuro/ Dr. Terrace Arabia  2. Bilateral impacted cerumen - unable to visualize TM due to cerumen - ? Reason for dizziness/ lightheadedness - ear canal naturally narrow, curved - recommend Debrox 5 gtts to both ears twice daily x 5 days - call office when debrox complete and schedule ear flushing  3. Mild closed head injury, initial encounter - 05/18 rolled out of bed - 05/20 ED evaluation - CT head/spine unremarkable for acute intracranial  pathology, no cervical fracture - 05/21 began to feel lightheaded, fatigued - neuro exam unremarkable, head atraumatic, appropriate - h/o stroke 2018 - MRI brain per family request due to new onset lightheadedness/fatigue> scheduled today at 7:30 [facility] - advised to report to ED is change in consciousness, headaches, N/V, weakness, chest pain or sob  4. Acute pain of left shoulder - see above - pain/ swelling subsided - cont voltaren gel prn  5. Fatigue, unspecified type - ongoing - also reported 05/2021 - ? Increased depression, OSA, advanced age - recheck blood counts today    Total time: 45 minutes. Greater than 50% of total time spent doing patient education regarding health maintenance, recent fall, lightheadedness, head injury, diagnostic results, cerumen impaction, and left shoulder pain including symptom/medication management.     Next appt: 11/27/2022  Hazle Nordmann, Juel Burrow  [facility] [phone]

## 2022-07-17 NOTE — Telephone Encounter (Signed)
Patient called to question if a rx was to be sent in for her, following her appointment today. Patient states " Maybe I misunderstood, as I am at [facility] and they have not received any rx's for me."   Patient was placed on a brief hold while I consulted with Amy. Per Amy patient to get Debrox, which is over the counter to use for her ears.  Call resumed and patient verbalized understanding.

## 2022-07-17 NOTE — Telephone Encounter (Signed)
Patient: at home Provider: home call  Received phone call as on call provider from patient's daughter that patient had a recent ED visit. Patient's daughter Johny Drilling) called to help with coordination of care due to mother's visual impairment.   She was seen in the ED and she had a CT scan, her daughter is wondering if she can have an MRI. Wednesday she was complaining of her head "feeling funny" felt like her body was "pulling to one side." She had a fall from her bed reaching over to her phone. She was having diarrhea. Her mom is having more trouble with vision.   When she spoke with Amy on 5/20 she was under the understanding a stat MRI was ordered-- Neurology and physical therapy ordered. Discussed this would be same day or up to one week to have the MRI performed. The concern is that her mother has had a stroke. She was told an MRI could be done 1-5 days based on a stat MRI order.   Of note, they just had a traumatic experience with a family member who fell and died due to a stroke that was not seen on imaging.   Sending information for help with coordination of care and should be addressed in a timely manner given concern for stroke. Please communicate with patient's daughter -- she has significant visual impairment and her husband can help with driving. Please contact Johny Drilling once things are clarified.   Advised that EMS is the most appropriate way to make sure she has emergent evaluation and an MRI would be the most definitive diagnosis for a stroke.   I spent greater than 30 minutes for the care of this patient in face to face time, chart review, clinical documentation, patient education.   Coralyn Helling, MD, [facility] [phone]

## 2022-07-17 NOTE — Progress Notes (Signed)
Daughter, [name] reports intermittent lightheadedness within last 24 hours. She has also had some diarrhea. She is alert and orientated x 4. Denies headaches, N/V, chest pain, sob or weakness. Daughter upset CT head was done in ED over MRI brain due to stroke history. STAT MRI brain without contrast ordered with [facility]. She is also scheduled to be seen at [facility] at 1 pm. Advised to keep appointment if MRI does not conflict with times. Advised if symptoms worsen to call 911 or report to ED.

## 2022-07-17 NOTE — Patient Instructions (Addendum)
Will draw cbc/diff and cmp> to check blood counts and electrolytes  Ears full of wax> start Debrox 5 gtts to both ears, twice daily x 5 days> when complete schedule to have ears flushed in office  Encourage hydration with water  If you feel more tired, headaches, nausea, vomiting, weakness please go to emergency department

## 2022-07-18 ENCOUNTER — Ambulatory Visit
Admission: RE | Admit: 2022-07-18 | Discharge: 2022-07-18 | Disposition: A | Discharge status: Home / Self Care | Payer: Medicare HMO | Source: Ambulatory Visit | Attending: Orthopedic Surgery | Admitting: Orthopedic Surgery

## 2022-07-18 DIAGNOSIS — S0990XA Unspecified injury of head, initial encounter: Secondary | ICD-10-CM | POA: Insufficient documentation

## 2022-07-18 LAB — CBC WITH DIFFERENTIAL/PLATELET
Basophils Absolute: 22 cells/uL (ref 0–200)
Eosinophils Absolute: 141 cells/uL (ref 15–500)
Eosinophils Relative: 3.2 %
MCHC: 33.6 g/dL (ref 32.0–36.0)
MPV: 12.4 fL (ref 7.5–12.5)
Monocytes Relative: 11.5 %
Neutro Abs: 2715 cells/uL (ref 1500–7800)
Neutrophils Relative %: 61.7 %
Platelets: 160 10*3/uL (ref 140–400)
RBC: 4.24 10*6/uL (ref 3.80–5.10)
RDW: 12.7 % (ref 11.0–15.0)
WBC: 4.4 10*3/uL (ref 3.8–10.8)

## 2022-07-18 LAB — COMPLETE METABOLIC PANEL WITH GFR
AG Ratio: 1.7 (calc) (ref 1.0–2.5)
ALT: 16 U/L (ref 6–29)
AST: 22 U/L (ref 10–35)
Albumin: 4 g/dL (ref 3.6–5.1)
Alkaline phosphatase (APISO): 68 U/L (ref 37–153)
BUN: 13 mg/dL (ref 7–25)
CO2: 31 mmol/L (ref 20–32)
Calcium: 9 mg/dL (ref 8.6–10.4)
Chloride: 102 mmol/L (ref 98–110)
Creat: 0.82 mg/dL (ref 0.60–0.95)
Globulin: 2.3 g/dL (calc) (ref 1.9–3.7)
Glucose, Bld: 87 mg/dL (ref 65–99)
Potassium: 4.6 mmol/L (ref 3.5–5.3)
Sodium: 139 mmol/L (ref 135–146)
Total Bilirubin: 0.5 mg/dL (ref 0.2–1.2)
Total Protein: 6.3 g/dL (ref 6.1–8.1)
eGFR: 71 mL/min/{1.73_m2} (ref >=60)

## 2022-07-18 MED ORDER — GADOBUTROL 1 MMOL/ML IV SOLN
5.0000 mL | Freq: Once | INTRAVENOUS | Status: AC | PRN
  Administered 2022-07-18: 5 mL via INTRAVENOUS

## 2022-08-20 ENCOUNTER — Ambulatory Visit: Payer: Medicare HMO | Admitting: Neurology

## 2022-08-20 ENCOUNTER — Ambulatory Visit (INDEPENDENT_AMBULATORY_CARE_PROVIDER_SITE_OTHER): Payer: Medicare HMO | Admitting: Neurology

## 2022-08-20 ENCOUNTER — Telehealth: Payer: Self-pay | Admitting: Neurology

## 2022-08-20 VITALS — BP 132/76 | HR 82 | Ht 61.0 in | Wt 118.0 lb

## 2022-08-20 DIAGNOSIS — M4692 Unspecified inflammatory spondylopathy, cervical region: Secondary | ICD-10-CM | POA: Diagnosis not present

## 2022-08-20 DIAGNOSIS — R4189 Other symptoms and signs involving cognitive functions and awareness: Secondary | ICD-10-CM | POA: Diagnosis not present

## 2022-08-20 DIAGNOSIS — R202 Paresthesia of skin: Secondary | ICD-10-CM

## 2022-08-20 MED ORDER — GABAPENTIN 100 MG PO CAPS: 100.0000 mg | ORAL_CAPSULE | Freq: Three times a day (TID) | ORAL | 5 refills | Status: DC | PRN

## 2022-08-20 NOTE — Progress Notes (Signed)
No chief complaint on file.     ASSESSMENT AND PLAN  Paige Pena is a 82 y.o. female   Paresthesia Cervical spondylosis  EMG nerve conduction study showed no evidence of active cervical radiculopathy, no focal neuropathy,  Hyperreflexia on examination bilateral Babinski signs, worsening urinary urgency occasional incontinence,   MRI of cervical spine showed multilevel degenerative changes, mild canal stenosis at C3-4, preferentially involving left side, severe bilateral foraminal stenosis, C6-7 also mild canal stenosis, variable degree of foraminal narrowing,  Discussed with patient, she hoped to establish relationship with neurosurgeon, in case her symptoms continue to get worse,  Gabapentin low-dose as needed    Cognitive Impairment  MOCA 21/30  Strong family history of dementia  MRI of brain in May 2024 showed no acute abnormality  Lab in June 2023 showed normal B12, vitamin D, TSH  DIAGNOSTIC DATA (LABS, IMAGING, TESTING) - I reviewed patient records, labs, notes, testing and imaging myself where available.   MEDICAL HISTORY:  Paige Pena is a 82 year old female, seen in request by [facility] Senior primary care nurse practitioner Coletta Memos, Amy E, for evaluation of gait abnormality, numbness at both hands and feet, initial evaluation January 28, 2022   I reviewed and summarized the referring note.PMHX HLD GERD   Since beginning of 2023 she noticed numbness tingling in bilateral feet and hands, gradually getting worse, also noticed mild unsteady gait  This happened following she fell off the ladder earlier, she was stepping up on a ladder of 5 of 6 feet, wiping her mirror,  missing steps going down, fell backwards,  She denies bowel or bladder incontinence,  Reviewed CT chest in July 2023 for evaluation of lung nodule, there is evidence of multilevel cervical degenerative changes  UPDATE May 08 2022: She retired as Immunologist, used to enjoy playing piano,  reading, now due to vision issues, less active, complains of intermittent left hand foot numbness, no pain, uncomfortable, left leg draggy when walking, mild neck discomfort, urinary urgency, wear pad.  Personally reviewed MRI of cervical on Mar 26 2022, level degenerative changes, most obvious at C3-4, facet hypertrophy mild canal stenosis severe biforaminal stenosis, C6-7, mild canal stenosis, variable degree of foraminal narrowing and  She also complains of brain fog, examination 21/30 today, strong family history of dementia, mother, maternal uncle, aunt, grandmother, suffered memory loss  Laboratory evaluation in June 2023, normal B12, folic acid, CBC,Hg 13.2 vitamin D, TSH, CMP, elevated LDL 137 total cholesterol 782, ferritin 12.  UPDATE June 26th 2024: Return for electrodiagnostic study today, for evaluation of her left hand paresthesia, left foot cramping, dragging her left foot some while walking, also complains of worsening urinary urgency occasionally accident  EMG nerve conduction study showed no large fiber neuropathy, no evidence of left cervical or lumbar radiculopathy.  There is no evidence of left carpal tunnel syndrome  We personally reviewed MRI of cervical spine from January 2024: Multilevel degenerative changes, mild progression compared to 2017, mild canal stenosis at C3-4 C 6 7, with moderate bilateral foraminal stenosis, preferential involvement of C3-4 at left side,   Hyperreflexia on examination, bilateral Babinski signs,  PHYSICAL EXAM:    PHYSICAL EXAMNIATION:  Gen: NAD, conversant, well nourised, well groomed                     Cardiovascular: Regular rate rhythm, no peripheral edema, warm, nontender. Eyes: Conjunctivae clear without exudates or hemorrhage Neck: Supple, no carotid bruits. Pulmonary: Clear to auscultation bilaterally  NEUROLOGICAL EXAM:  MENTAL STATUS: Speech/cognition: Awake, alert, oriented to history taking and casual conversation     05/08/2022    1:00 PM  Montreal Cognitive Assessment   Visuospatial/ Executive (0/5) 3  Naming (0/3) 3  Attention: Read list of digits (0/2) 2  Attention: Read list of letters (0/1) 1  Attention: Serial 7 subtraction starting at 100 (0/3) 0  Language: Repeat phrase (0/2) 2  Language : Fluency (0/1) 0  Abstraction (0/2) 2  Delayed Recall (0/5) 2  Orientation (0/6) 6  Total 21    CRANIAL NERVES: CN II: Visual fields are full to confrontation. Left pupil is round equal and briskly reactive to light. Right pupil post surgical changes, opaque, OD 20/400 CN III, IV, VI: extraocular movement are normal. No ptosis. CN V: Facial sensation is intact to light touch CN VII: Face is symmetric with normal eye closure  CN VIII: Hearing is normal to causal conversation. CN IX, X: Phonation is normal. CN XI: Head turning and shoulder shrug are intact  MOTOR: Mild left shoulder abduction external rotation weakness,  REFLEXES: Reflexes are 3  and symmetric at the biceps, triceps, knees, and ankles. Plantar responses are extensor bilaterally  SENSORY: Intact to light touch, pinprick and vibratory sensation are intact in fingers and toes.  COORDINATION: There is no trunk or limb dysmetria noted.  GAIT/STANCE:Can getup from seated position arm crossed, mild stiff, still stable, drag left leg some.  REVIEW OF SYSTEMS:  Full 14 system review of systems performed and notable only for as above All other review of systems were negative.   ALLERGIES: Allergies  Allergen Reactions   Morphine Other (See Comments)    REACTION: lethargy/malaise   Metoclopramide Hcl Anxiety and Other (See Comments)    REACTION: anxious and out of her mind   Aricept [Donepezil Hcl]     nausea   Doxycycline Diarrhea   Penicillins Itching and Rash    Has patient had a PCN reaction causing immediate rash, facial/tongue/throat swelling, SOB or lightheadedness with hypotension: No Has patient had a PCN reaction causing  severe rash involving mucus membranes or skin necrosis: No Has patient had a PCN reaction that required hospitalization No Has patient had a PCN reaction occurring within the last 10 years: Yes If all of the above answers are "NO", then may proceed with Cephalosporin use.     Prednisone Itching and Rash   Sulfonamide Derivatives Itching and Rash    HOME MEDICATIONS: Current Outpatient Medications  Medication Sig Dispense Refill   gabapentin (NEURONTIN) 100 MG capsule Take 1 capsule (100 mg total) by mouth 3 (three) times daily as needed. 90 capsule 5   aspirin 325 MG EC tablet Take 325 mg by mouth daily.     atorvastatin (LIPITOR) 20 MG tablet Take 1 tablet (20 mg total) by mouth every Monday, Wednesday, and Friday. 30 tablet 3   Biotin 1000 MCG tablet Take 1,000 mcg by mouth 3 (three) times daily.     Cholecalciferol (VITAMIN D) 50 MCG (2000 UT) CAPS Take by mouth.     COLLAGEN-VITAMIN C PO Take by mouth.     cyanocobalamin (VITAMIN B12) 1000 MCG tablet Take 1 tablet (1,000 mcg total) by mouth daily. 30 tablet 3   DULoxetine (CYMBALTA) 60 MG capsule Take 1 capsule (60 mg total) by mouth daily. 90 capsule 1   fluticasone (FLONASE) 50 MCG/ACT nasal spray Place 2 sprays into both nostrils daily as needed for allergies. (Patient not taking: Reported on 07/17/2022) 16  g 3   hydrOXYzine (VISTARIL) 25 MG capsule Take 1 capsule (25 mg total) by mouth daily as needed. For panic attack or increased anxiety 30 capsule 0   Omega-3 Fatty Acids (FISH OIL) 1000 MG CAPS Take by mouth.     omeprazole (PRILOSEC) 40 MG capsule TAKE 1 CAPSULE TWICE DAILY 180 capsule 1   prednisoLONE acetate (PRED FORTE) 1 % ophthalmic suspension Place 1 drop into both eyes as directed.     No current facility-administered medications for this visit.    PAST MEDICAL HISTORY: Past Medical History:  Diagnosis Date   Acute cystitis    Anxiety    Aphasia    transsient aphasia Jan 2012   Asthmatic bronchitis     Cerebrovascular disease    Diverticulosis of colon    DJD (degenerative joint disease)    Dysphonia    Esophageal stricture    Fibromyalgia    Gastroparesis    GERD (gastroesophageal reflux disease)    Hematuria, microscopic    Hemorrhoids    Hypercholesteremia    IBS (irritable bowel syndrome)    Memory deficits 02/15/2013   RLS (restless legs syndrome)    TIA (transient ischemic attack)    Urinary incontinence    Vitamin D deficiency     PAST SURGICAL HISTORY: Past Surgical History:  Procedure Laterality Date   ABDOMINAL HYSTERECTOMY  02/24/1993   cataract surgery Right 12/19/2021   corneal transplant for keratonconus     EYE SURGERY  02/25/2011   left foot fracture with ORIF  07/25/2004   Dr. Lajoyce Corners   right bunion surgery      FAMILY HISTORY: Family History  Problem Relation Age of Onset   Hypertension Mother    Anemia Mother    Dementia Mother    Kidney disease Father    Hyperlipidemia Sister    Diabetes Brother    Lung cancer Brother    Dementia Maternal Grandmother    Diabetes Other        aunt,brother,uncle   Breast cancer Maternal Aunt     SOCIAL HISTORY: Social History   Socioeconomic History   Marital status: Married    Spouse name: Sherilyn Cooter   Number of children: 3   Years of education: college-2   Highest education level: Not on file  Occupational History    Employer: RETIRED  Tobacco Use   Smoking status: Never   Smokeless tobacco: Never  Vaping Use   Vaping Use: Never used  Substance and Sexual Activity   Alcohol use: Yes    Comment: occ   Drug use: Never   Sexual activity: Not on file  Other Topics Concern   Not on file  Social History Narrative   [name]   [place], daughter   Lashona Schaaf info release form to share her medical records with her children   Lives at home w/ her husband   Right-handed   Caffeine: tea on occasion   Social Determinants of Health   Financial Resource Strain: Low  Risk  (05/01/2022)   Overall Financial Resource Strain (CARDIA)    Difficulty of Paying Living Expenses: Not hard at all  Food Insecurity: No Food Insecurity (05/01/2022)   Hunger Vital Sign    Worried About Running Out of Food in the Last Year: Never true    Ran Out of Food in the Last Year: Never true  Transportation Needs: No Transportation Needs (05/01/2022)   PRAPARE - Administrator, Civil Service (Medical): No  Lack of Transportation (Non-Medical): No  Physical Activity: Sufficiently Active (05/01/2022)   Exercise Vital Sign    Days of Exercise per Week: 3 days    Minutes of Exercise per Session: 50 min  Stress: Stress Concern Present (05/01/2022)   [employer] of Occupational Health - Occupational Stress Questionnaire    Feeling of Stress : To some extent  Social Connections: Moderately Integrated (05/01/2022)   Social Connection and Isolation Panel [NHANES]    Frequency of Communication with Friends and Family: More than three times a week    Frequency of Social Gatherings with Friends and Family: Once a week    Attends Religious Services: More than 4 times per year    Active Member of [employer] or Organizations: No    Attends Banker Meetings: Never    Marital Status: Married  Catering manager Violence: Not At Risk (05/01/2022)   Humiliation, Afraid, Rape, and Kick questionnaire    Fear of Current or Ex-Partner: No    Emotionally Abused: No    Physically Abused: No    Sexually Abused: No      Levert Feinstein, M.D. Ph.D.  [facility] [address] [place], [place] [address] Ph: [phone] Fax: [phone]  CC:  [name], NP [address] [place],  [place] [address]  [name], NP

## 2022-08-20 NOTE — Telephone Encounter (Signed)
Referral faxed to [facility]: Phone: [phone]  Fax:[phone] 

## 2022-08-20 NOTE — Procedures (Signed)
Full Name: Paige Pena Gender: Female MRN #: [id number] Date of Birth: 08-28-1940    Visit Date: 08/20/2022 12:40 Age: 82 Years Examining Physician: Dr. Levert Feinstein Referring Physician: Dr. Levert Feinstein Height: 5 feet 1 inch History: Neck pain, intermittent left upper and lower extremity paresthesia   Summary of test: Nerve conduction study: Left sural, superficial peroneal sensory responses were normal.  Left ulnar sensory responses were normal.  Left median sensory response showed slightly prolonged peak latency with normal snap amplitude.  Left median and ulnar mixed responses were within normal limit.  Left peroneal to EDB, tibial, median and ulnar motor responses were normal.  Electromyography: Selected needle exam lesions of left upper, lower extremity muscles, cervical and lumbosacral paraspinal muscles were normal.  Conclusion: This is essentially a normal study.  There is no electrodiagnostic evidence of large fiber peripheral neuropathy, left cervical, lumbar radiculopathy.  There is no evidence of left median neuropathy across the wrist.    ------------------------------- Levert Feinstein M.D. Ph.D.  [facility] [address], [address] [place], [place] [address] Tel: [phone] Fax: [phone]  Verbal informed consent was obtained from the patient, patient was informed of potential risk of procedure, including bruising, bleeding, hematoma formation, infection, muscle weakness, muscle pain, numbness, among others.        MNC    Nerve / Sites Muscle Latency Ref. Amplitude Ref. Rel Amp Segments Distance Velocity Ref. Area    ms ms mV mV %  cm m/s m/s mVms  L Median - APB     Wrist APB 3.7 ?4.4 6.1 ?4.0 100 Wrist - APB 7   17.5     Upper arm APB 8.1  5.8  94.9 Upper arm - Wrist 24.6 56 ?49 16.8  L Ulnar - ADM     Wrist ADM 2.6 ?3.3 7.5 ?6.0 100 Wrist - ADM 7   29.3     B.Elbow ADM 4.8  6.8  91.8 B.Elbow - Wrist 12.6 59 ?49 27.1     A.Elbow  ADM 7.5  5.6  82.1 A.Elbow - B.Elbow 15.6 57 ?49 25.2  L Peroneal - EDB     Ankle EDB 4.9 ?6.5 6.4 ?2.0 100 Ankle - EDB 9   22.7     Fib head EDB 10.7  7.1  110 Fib head - Ankle 26 45 ?44 23.9     Pop fossa EDB 12.7  6.8  95.8 Pop fossa - Fib head 9 45 ?44 23.5         Pop fossa - Ankle      L Tibial - AH     Ankle AH 4.5 ?5.8 7.1 ?4.0 100 Ankle - AH 9   13.9     Pop fossa AH 11.9  5.7  80.5 Pop fossa - Ankle 32 43 ?41 15.5             SNC    Nerve / Sites Rec. Site Peak Lat Ref.  Amp Ref. Segments Distance Peak Diff Ref.    ms ms V V  cm ms ms  L Sural - Ankle (Calf)     Calf Ankle 3.5 ?4.4 12 ?6 Calf - Ankle 14    L Superficial peroneal - Ankle     Lat leg Ankle 3.9 ?4.4 10 ?6 Lat leg - Ankle 14    L Median, Ulnar - Transcarpal comparison     Median Palm Wrist 2.3 ?2.2 104 ?35 Median Palm - Wrist 8  Ulnar Palm Wrist 2.2 ?2.2 33 ?12 Ulnar Palm - Wrist 8          Median Palm - Ulnar Palm  0.2 ?0.4  L Median - Orthodromic (Dig II, Mid palm)     Dig II Wrist 3.5 ?3.4 23 ?10 Dig II - Wrist 13    L Ulnar - Orthodromic, (Dig V, Mid palm)     Dig V Wrist 3.1 ?3.1 16 ?5 Dig V - Wrist 16                 F  Wave    Nerve F Lat Ref.   ms ms  L Tibial - AH 47.8 ?56.0  L Ulnar - ADM 26.1 ?32.0         EMG Summary Table    Spontaneous MUAP Recruitment  Muscle IA Fib PSW Fasc Other Amp Dur. Poly Pattern  L. First dorsal interosseous Normal None None None _______ Normal Normal Normal Normal  L. Pronator teres Normal None None None _______ Normal Normal Normal Normal  L. Biceps brachii Normal None None None _______ Normal Normal Normal Normal  L. Deltoid Normal None None None _______ Normal Normal Normal Normal  L. Triceps brachii Normal None None None _______ Normal Normal Normal Normal  L. Brachioradialis Normal None None None _______ Normal Normal Normal Normal  L. Tibialis anterior Normal None None None _______ Normal Normal Normal Normal  L. Tibialis posterior Normal None None  None _______ Normal Normal Normal Normal  L. Peroneus longus Normal None None None _______ Normal Normal Normal Normal  L. Gastrocnemius (Medial head) Normal None None None _______ Normal Normal Normal Normal  L. Vastus lateralis Normal None None None _______ Normal Normal Normal Normal  L. Cervical paraspinals (low) Normal None None None _______ Normal Normal Normal Normal  L. Cervical paraspinals (mid) Normal None None None _______ Normal Normal Normal Normal  L. Lumbar paraspinals (low) Normal None None None _______ Normal Normal Normal Normal  L. Lumbar paraspinals (mid) Normal None None None _______ Normal Normal Normal Normal

## 2022-08-25 ENCOUNTER — Telehealth: Payer: Self-pay | Admitting: Neurology

## 2022-08-25 NOTE — Telephone Encounter (Signed)
Pt is needing to speak to the RN today before her appt to see if provider has seen updated notes and results in her chart. Please advise.

## 2022-08-25 NOTE — Telephone Encounter (Signed)
Please call pt and tell her per yan that the appt is not needed and try to fill it with someone else per provider request.

## 2022-08-25 NOTE — Telephone Encounter (Signed)
I just saw her on June 26th, this is after her ED visit in May.   It is Ok to cancel July 2nd appt, and fill it with new patient

## 2022-08-25 NOTE — Telephone Encounter (Signed)
Call to patient, she states that she is inquiring if she needs to keep her appointment tomorrow. The appointment was made back in May when she fell but she went to the ER. She had CT and  MRI and not sure if anyone has gone over those result with her but she did see them in my chart. Please review and advise if you need her to keep the appointment with you tomorrow. I did remind the patient she had sent a referral to [facility].

## 2022-08-25 NOTE — Telephone Encounter (Signed)
Spoke with patient, she is aware she should be receiving a call from [facility] to schedule an appt, if she does not hear from them by Wednesday, I let her know to give them a call. She is aware Dr. Terrace Arabia went over her recent imaging from the ED and  is ok with canceling appt for tomorrow and awaiting call from Neurosurgery.

## 2022-08-26 ENCOUNTER — Institutional Professional Consult (permissible substitution): Payer: Medicare HMO | Admitting: Neurology

## 2022-09-03 ENCOUNTER — Telehealth: Payer: Self-pay | Admitting: Neurology

## 2022-09-03 ENCOUNTER — Telehealth: Payer: Medicare HMO

## 2022-09-03 NOTE — Telephone Encounter (Signed)
Call to patient regarding Gabapentin, she states she is concerned about taking with vistaril and cymbalta. She states vistaril is not daily. I advised that Dr. Terrace Arabia prescribed and aware of other medications but will still send for peace of mind. Also advised on taking by itself the first time and made aware it can cause drowsiness. Patient verbalized understanding and states a my chart message is fine for response from Dr. Terrace Arabia.

## 2022-09-03 NOTE — Telephone Encounter (Signed)
It is effective in reducing orthopedic pain. It is also not a narcotic/controlled substance. Side effect can be drowsiness. I typically do not see this in lower doses. I agree with Dr. Terrace Arabia recommendation.

## 2022-09-03 NOTE — Telephone Encounter (Signed)
Pt called needing to speak to the RN regarding her gabapentin (NEURONTIN) 100 MG capsule  Please advise.

## 2022-09-03 NOTE — Telephone Encounter (Signed)
I called and left message for patient to call office when available.

## 2022-09-03 NOTE — Telephone Encounter (Signed)
Patient called wanting to know how Hazle Nordmann, NP feels about her taking gabapentin. Please advise.  Message sent to Hazle Nordmann, NP

## 2022-09-04 NOTE — Telephone Encounter (Signed)
I spoke with patient and discussed response from Hazle Nordmann, NP. Patient verbalized her understanding and agreed.

## 2022-09-29 DIAGNOSIS — S76212A Strain of adductor muscle, fascia and tendon of left thigh, initial encounter: Secondary | ICD-10-CM | POA: Diagnosis not present

## 2022-09-29 DIAGNOSIS — M47812 Spondylosis without myelopathy or radiculopathy, cervical region: Secondary | ICD-10-CM | POA: Diagnosis not present

## 2022-10-01 ENCOUNTER — Encounter: Payer: Medicare HMO | Admitting: Neurology

## 2022-10-08 ENCOUNTER — Telehealth: Payer: Self-pay

## 2022-10-08 DIAGNOSIS — H52213 Irregular astigmatism, bilateral: Secondary | ICD-10-CM | POA: Diagnosis not present

## 2022-10-08 DIAGNOSIS — Z961 Presence of intraocular lens: Secondary | ICD-10-CM | POA: Diagnosis not present

## 2022-10-08 DIAGNOSIS — H2512 Age-related nuclear cataract, left eye: Secondary | ICD-10-CM | POA: Diagnosis not present

## 2022-10-08 DIAGNOSIS — Z947 Corneal transplant status: Secondary | ICD-10-CM | POA: Diagnosis not present

## 2022-10-08 NOTE — Telephone Encounter (Signed)
Recommend Debrox 5 drops to both ears twice daily x 5 days. Debrox will soften wax so it is easier to flush. You may then schedule ear lavage after drops have been completed. [facility] [phone].

## 2022-10-08 NOTE — Telephone Encounter (Signed)
Patient called stating that she was told on her last visit to use debrox and follow up in a week,but she was unable to. She states that she is having some ringing in her ears. She would like to know if she should use the ear drops again, then schedule another appointment or what should she do. Patient also mentioned she was supposed to get number to [facility]?  Message sent to Hazle Nordmann, NP

## 2022-10-13 NOTE — Telephone Encounter (Signed)
Patient called back and information relayed to her. She verbalized her understanding.

## 2022-10-14 DIAGNOSIS — M6289 Other specified disorders of muscle: Secondary | ICD-10-CM | POA: Diagnosis not present

## 2022-10-14 DIAGNOSIS — R151 Fecal smearing: Secondary | ICD-10-CM | POA: Diagnosis not present

## 2022-10-14 DIAGNOSIS — N3941 Urge incontinence: Secondary | ICD-10-CM | POA: Diagnosis not present

## 2022-10-14 DIAGNOSIS — M6281 Muscle weakness (generalized): Secondary | ICD-10-CM | POA: Diagnosis not present

## 2022-10-14 DIAGNOSIS — R351 Nocturia: Secondary | ICD-10-CM | POA: Diagnosis not present

## 2022-10-22 ENCOUNTER — Ambulatory Visit: Payer: Medicare HMO | Admitting: Podiatry

## 2022-11-04 ENCOUNTER — Ambulatory Visit: Payer: Medicare HMO | Attending: Neurosurgery

## 2022-11-04 DIAGNOSIS — X58XXXD Exposure to other specified factors, subsequent encounter: Secondary | ICD-10-CM | POA: Insufficient documentation

## 2022-11-04 DIAGNOSIS — Z8673 Personal history of transient ischemic attack (TIA), and cerebral infarction without residual deficits: Secondary | ICD-10-CM | POA: Insufficient documentation

## 2022-11-04 DIAGNOSIS — S76212D Strain of adductor muscle, fascia and tendon of left thigh, subsequent encounter: Secondary | ICD-10-CM | POA: Insufficient documentation

## 2022-11-04 DIAGNOSIS — R269 Unspecified abnormalities of gait and mobility: Secondary | ICD-10-CM | POA: Insufficient documentation

## 2022-11-04 DIAGNOSIS — M797 Fibromyalgia: Secondary | ICD-10-CM | POA: Insufficient documentation

## 2022-11-18 NOTE — Therapy (Incomplete)
OUTPATIENT PHYSICAL THERAPY LOWER EXTREMITY EVALUATION   Patient Name: Paige Pena MRN: [id number] DOB:Oct 31, 1940, 82 y.o., female Today's Date: 11/19/2022  END OF SESSION:  PT End of Session - 11/19/22 1548     Visit Number 1    Authorization Type [employer]    PT Start Time 1545    PT Stop Time 1615    PT Time Calculation (min) 30 min             Past Medical History:  Diagnosis Date   Acute cystitis    Anxiety    Aphasia    transsient aphasia Jan 2012   Asthmatic bronchitis    Cerebrovascular disease    Diverticulosis of colon    DJD (degenerative joint disease)    Dysphonia    Esophageal stricture    Fibromyalgia    Gastroparesis    GERD (gastroesophageal reflux disease)    Hematuria, microscopic    Hemorrhoids    Hypercholesteremia    IBS (irritable bowel syndrome)    Memory deficits 02/15/2013   RLS (restless legs syndrome)    TIA (transient ischemic attack)    Urinary incontinence    Vitamin D deficiency    Past Surgical History:  Procedure Laterality Date   ABDOMINAL HYSTERECTOMY  02/24/1993   cataract surgery Right 12/19/2021   corneal transplant for keratonconus     EYE SURGERY  02/25/2011   left foot fracture with ORIF  07/25/2004   Dr. Lajoyce Corners   right bunion surgery     Patient Active Problem List   Diagnosis Date Noted   Essential hypertension 07/09/2022   Pulmonary nodule 09/03/2021   Osteopenia 07/25/2021   Throat irritation 11/28/2019   HLD (hyperlipidemia)    Overactive bladder 05/08/2015   Aphasia    Cervical spondylitis (HCC)    Paresthesias 03/21/2015   TIA (transient ischemic attack) 03/21/2015   Physical exam 10/05/2014   Abdominal pain 05/17/2014   Allergic rhinitis 05/17/2014   Fatigue 04/04/2014   Anxiety and depression 07/25/2013   Weight loss 07/25/2013   Cognitive impairment 02/15/2013   Lumbar spondylosis 09/21/2012   Tachycardia 09/25/2011   Palpitations 09/16/2010   Dysphonia 09/05/2010   Slowing of urinary  stream 09/25/2009   Cerebrovascular disease 04/03/2008   DYSPHONIA 04/03/2008   Diverticulosis of large intestine 04/02/2008   Irritable bowel syndrome 04/02/2008   DEGENERATIVE JOINT DISEASE 04/02/2008   HEMATURIA, MICROSCOPIC, HX OF 04/02/2008   Vitamin D deficiency 03/28/2008   Fibromyalgia 03/28/2008   URINARY INCONTINENCE 03/28/2008   GERD 08/02/2007   Gastroparesis 08/02/2007   HEMORRHOIDS 07/30/2007   ESOPHAGEAL STRICTURE 07/30/2007   Anxiety state 07/30/2007    PCP: Hazle Nordmann  REFERRING PROVIDER: Coletta Memos  REFERRING DIAG:  [id number] (ICD-10-CM) - Strain of adductor muscle, fascia and tendon of left thigh, initial encounter    THERAPY DIAG:  Abnormal gait  Rationale for Evaluation and Treatment: Rehabilitation  ONSET DATE: 09/30/22  SUBJECTIVE:   SUBJECTIVE STATEMENT: Same old stuff, some kind of muscle ache in the back of my leg. It is the same feeling I have always had.   PERTINENT HISTORY: fibromyalgia, anxiety, TIA x 2 (last one was about 5 years ago); pt reports of having 4 screws in left foot from fractured foot years ago from fall   PAIN:  Are you having pain? No  PRECAUTIONS: None  RED FLAGS: None   WEIGHT BEARING RESTRICTIONS: No  FALLS:  Has patient fallen in last 6 months? No  LIVING ENVIRONMENT: Lives with:  lives with their spouse Lives in: House/apartment Stairs: Yes: External: 3 steps; can reach both Has following equipment at home: None   PLOF: Independent   OBJECTIVE:   DIAGNOSTIC FINDINGS: MRI cervical spine (without) demonstrating: - At C3-4 disc bulging and facet hypertrophy with mild spinal stenosis and severe bilateral foraminal stenosis. - At C6-7 disc bulging with mild spinal stenosis and mild bilateral foraminal stenosis. - At C4-5, C5-6 uncovertebral joint hypertrophy with moderate bilateral foraminal stenosis. -Mild progression of degenerative changes since 201   COGNITION: Overall cognitive status: Within  functional limits for tasks assessed     SENSATION: WFL   MUSCLE LENGTH: Hamstrings: normal  POSTURE: No Significant postural limitations  PALPATION: No TTP  LOWER EXTREMITY ROM: grossly WFL  LOWER EXTREMITY MMT: grossly WNL   FUNCTIONAL TESTS:  5 times sit to stand: 14s Timed up and go (TUG): 12s SLS 10s  Tandem stance 30s    TODAY'S TREATMENT:                                                                                                                              DATE: EVAL 11/19/22    PATIENT EDUCATION:  Education details: POC and HEP Person educated: Patient Education method: Explanation Education comprehension: verbalized understanding  HOME EXERCISE PROGRAM: Access Code: [email or web address] URL: [email or web address] Date: 11/19/2022 Prepared by: Cassie Freer  Exercises - Sit to Stand  - 1 x daily - 7 x weekly - 2 sets - 10 reps - Heel Raises with Counter Support  - 1 x daily - 7 x weekly - 2 sets - 10 reps - Standing Hip Abduction with Counter Support  - 1 x daily - 7 x weekly - 2 sets - 10 reps - Seated Hip Abduction with Resistance  - 1 x daily - 7 x weekly - 2 sets - 10 reps - Seated Hip Adduction Isometrics with Ball  - 1 x daily - 7 x weekly - 2 sets - 10 reps - Single Leg Stance  - 1 x daily - 7 x weekly - 10 hold - Standing Tandem Balance with Counter Support  - 1 x daily - 7 x weekly - 30 hold - Side Stepping with Resistance at Thighs  - 1 x daily - 7 x weekly - 2 sets - 10 reps  ASSESSMENT:  CLINICAL IMPRESSION: Patient is a 82 y.o. female who was seen today for physical therapy evaluation and treatment for L left weakness and gait problems. Due to absence of functional deficits skilled physical therapy services are not warranted at this time. PT discussed this w/ pt and she was receptive. Pt encouraged to call back with any specific changes or development of limitations during functional activities, as well as to continue follow-up with  physician, as needed.?    REHAB POTENTIAL: Good  CLINICAL DECISION MAKING: Stable/uncomplicated  EVALUATION COMPLEXITY: Low   GOALS: Goals reviewed with patient? Yes  SHORT TERM GOALS: Target date: 11/19/22  Pt will verbalize understanding of role/purpose of physical therapy services and be able to independently identify if/when she may need to seek an add'l referral for services to assist w/ participation in functional activities Goal status: MET  PLAN:  Skilled PT services are not warranted at this time; pt encouraged to call back with any changes in functional status or limitations.   PT FREQUENCY: one time visit   Cassie Freer, PT 11/19/2022, 4:14 PM

## 2022-11-19 ENCOUNTER — Ambulatory Visit: Payer: Medicare HMO

## 2022-11-19 DIAGNOSIS — X58XXXD Exposure to other specified factors, subsequent encounter: Secondary | ICD-10-CM | POA: Diagnosis not present

## 2022-11-19 DIAGNOSIS — R269 Unspecified abnormalities of gait and mobility: Secondary | ICD-10-CM

## 2022-11-19 DIAGNOSIS — Z8673 Personal history of transient ischemic attack (TIA), and cerebral infarction without residual deficits: Secondary | ICD-10-CM | POA: Diagnosis not present

## 2022-11-19 DIAGNOSIS — S76212D Strain of adductor muscle, fascia and tendon of left thigh, subsequent encounter: Secondary | ICD-10-CM | POA: Diagnosis not present

## 2022-11-19 DIAGNOSIS — M797 Fibromyalgia: Secondary | ICD-10-CM | POA: Diagnosis not present

## 2022-11-24 ENCOUNTER — Other Ambulatory Visit: Payer: Self-pay | Admitting: Orthopedic Surgery

## 2022-11-24 ENCOUNTER — Other Ambulatory Visit: Payer: Medicare HMO

## 2022-11-24 ENCOUNTER — Ambulatory Visit: Payer: Medicare HMO

## 2022-11-24 ENCOUNTER — Ambulatory Visit: Payer: Medicare HMO | Admitting: Podiatry

## 2022-11-24 ENCOUNTER — Ambulatory Visit (INDEPENDENT_AMBULATORY_CARE_PROVIDER_SITE_OTHER): Payer: Medicare HMO

## 2022-11-24 DIAGNOSIS — M79672 Pain in left foot: Secondary | ICD-10-CM | POA: Diagnosis not present

## 2022-11-24 DIAGNOSIS — I10 Essential (primary) hypertension: Secondary | ICD-10-CM

## 2022-11-24 DIAGNOSIS — M79671 Pain in right foot: Secondary | ICD-10-CM

## 2022-11-24 DIAGNOSIS — R4189 Other symptoms and signs involving cognitive functions and awareness: Secondary | ICD-10-CM

## 2022-11-24 DIAGNOSIS — R739 Hyperglycemia, unspecified: Secondary | ICD-10-CM | POA: Diagnosis not present

## 2022-11-24 DIAGNOSIS — M2012 Hallux valgus (acquired), left foot: Secondary | ICD-10-CM | POA: Diagnosis not present

## 2022-11-24 NOTE — Progress Notes (Signed)
Chief Complaint  Patient presents with   Foot Pain    BILAT FOOT PAIN --- LEFT FOOT PAIN AT THE BUNION AND BALL OF FOOT, RIGHT FOOT PAIN MIDFOOT LATERAL SIDE AND BALL OF FOOT,     Subjective: 82 y.o. female presents today for follow-up evaluation of a symptomatic bunion to the left foot.  Patient states that several years prior she sustained a fracture to her left foot and underwent surgery.  Since that time she has had pain and tenderness to her left great toe joint and she says over the last few years her bunion deformity has actually enlarged and become more symptomatic.  She wears good supportive tennis shoes.  She presents to have it evaluated today  Past Medical History:  Diagnosis Date   Acute cystitis    Anxiety    Aphasia    transsient aphasia Jan 2012   Asthmatic bronchitis    Cerebrovascular disease    Diverticulosis of colon    DJD (degenerative joint disease)    Dysphonia    Esophageal stricture    Fibromyalgia    Gastroparesis    GERD (gastroesophageal reflux disease)    Hematuria, microscopic    Hemorrhoids    Hypercholesteremia    IBS (irritable bowel syndrome)    Memory deficits 02/15/2013   RLS (restless legs syndrome)    TIA (transient ischemic attack)    Urinary incontinence    Vitamin D deficiency     Past Surgical History:  Procedure Laterality Date   ABDOMINAL HYSTERECTOMY  02/24/1993   cataract surgery Right 12/19/2021   corneal transplant for keratonconus     EYE SURGERY  02/25/2011   left foot fracture with ORIF  07/25/2004   Dr. Lajoyce Corners   right bunion surgery      Allergies  Allergen Reactions   Morphine Other (See Comments)    REACTION: lethargy/malaise   Metoclopramide Hcl Anxiety and Other (See Comments)    REACTION: anxious and out of her mind   Aricept [Donepezil Hcl]     nausea   Doxycycline Diarrhea   Penicillins Itching and Rash    Has patient had a PCN reaction causing immediate rash, facial/tongue/throat swelling, SOB or  lightheadedness with hypotension: No Has patient had a PCN reaction causing severe rash involving mucus membranes or skin necrosis: No Has patient had a PCN reaction that required hospitalization No Has patient had a PCN reaction occurring within the last 10 years: Yes If all of the above answers are "NO", then may proceed with Cephalosporin use.     Prednisone Itching and Rash   Sulfonamide Derivatives Itching and Rash     Objective: Physical Exam General: The patient is alert and oriented x3 in no acute distress.  Dermatology: Skin is cool, dry and supple bilateral lower extremities. Negative for open lesions or macerations.  Vascular: Palpable pedal pulses bilaterally. No edema or erythema noted. Capillary refill within normal limits.  Neurological: Grossly intact via light touch  Musculoskeletal Exam: Clinical evidence of bunion deformity noted to the respective foot. There is moderate pain on palpation range of motion of the first MPJ. Lateral deviation of the hallux noted consistent with hallux abductovalgus.  Radiographic Exam LT foot 11/18/2021: Normal osseous mineralization.  No acute fractures identified.  Orthopedic hardware noted to the first metatarsal cuneiform as well as the second metatarsal cuneiform joints consistent with the patient's given history of midfoot fracture several years prior.  Increased intermetatarsal angle greater than 15 with a hallux abductus  angle greater than 30 noted on AP view.   Assessment: 1.  Hallux valgus left 2. H/o ORIF LT midfoot fracture/dislocations several years prior  Plan of Care:  1. Patient was evaluated. X-Rays reviewed. 2.  Today again we discussed the bunion deformity as well as surgery in detail and postoperative recovery course. 3.  After a very detailed discussion regarding bunion surgery and postoperative recovery course, I asked the patient if her bunion was affecting her daily quality of life and painful on a daily  basis.  Patient states that she would like to go home and think about how much it affects her and if she would like to proceed with surgery. 4.  In the meantime recommend wide fitting shoes that allow plenty of support 5.  Return to clinic as needed   Felecia Shelling, DPM [facility]  Dr. Felecia Shelling, DPM    [address] [place], [place] [address]                Office [phone]  Fax [phone]

## 2022-11-25 LAB — CBC WITH DIFFERENTIAL/PLATELET
Absolute Monocytes: 513 {cells}/uL (ref 200–950)
Basophils Absolute: 63 {cells}/uL (ref 0–200)
Basophils Relative: 1.1 %
Eosinophils Absolute: 279 {cells}/uL (ref 15–500)
Eosinophils Relative: 4.9 %
HCT: 40.1 % (ref 35.0–45.0)
Hemoglobin: 12.8 g/dL (ref 11.7–15.5)
Lymphs Abs: 1431 {cells}/uL (ref 850–3900)
MCH: 29.6 pg (ref 27.0–33.0)
MCHC: 31.9 g/dL — ABNORMAL LOW (ref 32.0–36.0)
MCV: 92.8 fL (ref 80.0–100.0)
MPV: 12 fL (ref 7.5–12.5)
Monocytes Relative: 9 %
Neutro Abs: 3414 {cells}/uL (ref 1500–7800)
Neutrophils Relative %: 59.9 %
Platelets: 246 10*3/uL (ref 140–400)
RBC: 4.32 10*6/uL (ref 3.80–5.10)
RDW: 11.8 % (ref 11.0–15.0)
Total Lymphocyte: 25.1 %
WBC: 5.7 10*3/uL (ref 3.8–10.8)

## 2022-11-25 LAB — HEMOGLOBIN A1C
Hgb A1c MFr Bld: 5.8 %{Hb} — ABNORMAL HIGH (ref <5.7)
Mean Plasma Glucose: 120 mg/dL
eAG (mmol/L): 6.6 mmol/L

## 2022-11-25 LAB — COMPLETE METABOLIC PANEL WITH GFR
AG Ratio: 1.3 (calc) (ref 1.0–2.5)
ALT: 10 U/L (ref 6–29)
AST: 18 U/L (ref 10–35)
Albumin: 3.9 g/dL (ref 3.6–5.1)
Alkaline phosphatase (APISO): 88 U/L (ref 37–153)
BUN: 22 mg/dL (ref 7–25)
CO2: 27 mmol/L (ref 20–32)
Calcium: 9.2 mg/dL (ref 8.6–10.4)
Chloride: 103 mmol/L (ref 98–110)
Creat: 0.88 mg/dL (ref 0.60–0.95)
Globulin: 3 g/dL (ref 1.9–3.7)
Glucose, Bld: 93 mg/dL (ref 65–99)
Potassium: 3.9 mmol/L (ref 3.5–5.3)
Sodium: 140 mmol/L (ref 135–146)
Total Bilirubin: 0.3 mg/dL (ref 0.2–1.2)
Total Protein: 6.9 g/dL (ref 6.1–8.1)
eGFR: 65 mL/min/{1.73_m2} (ref >=60)

## 2022-11-27 ENCOUNTER — Ambulatory Visit: Payer: Medicare HMO | Admitting: Orthopedic Surgery

## 2022-11-27 ENCOUNTER — Encounter: Payer: Self-pay | Admitting: Orthopedic Surgery

## 2022-11-27 VITALS — BP 140/84 | HR 85 | Temp 97.5°F | Resp 16 | Ht 61.0 in | Wt 113.4 lb

## 2022-11-27 DIAGNOSIS — R7303 Prediabetes: Secondary | ICD-10-CM

## 2022-11-27 DIAGNOSIS — E7800 Pure hypercholesterolemia, unspecified: Secondary | ICD-10-CM

## 2022-11-27 DIAGNOSIS — Z23 Encounter for immunization: Secondary | ICD-10-CM | POA: Diagnosis not present

## 2022-11-27 DIAGNOSIS — F419 Anxiety disorder, unspecified: Secondary | ICD-10-CM

## 2022-11-27 DIAGNOSIS — H938X3 Other specified disorders of ear, bilateral: Secondary | ICD-10-CM

## 2022-11-27 DIAGNOSIS — F32A Depression, unspecified: Secondary | ICD-10-CM

## 2022-11-27 DIAGNOSIS — R4189 Other symptoms and signs involving cognitive functions and awareness: Secondary | ICD-10-CM | POA: Diagnosis not present

## 2022-11-27 DIAGNOSIS — I10 Essential (primary) hypertension: Secondary | ICD-10-CM

## 2022-11-27 NOTE — Patient Instructions (Addendum)
Please schedule follow up with Dr. Terrace Arabia to discuss brain fog and memory issues  [facility] [address] [place], [place] [address] Ph: [phone] Fax: [phone]  Please limit sugars and carbs in diet. If you get a sweet tooth please eat fruit.   Recommend having family member present next visit  Try switching to Claritin for 2 weeks and stop Zyrtec. After 2 weeks you may switch back to Zyrtec   Ears are clean> only use drops for ear wax buildup

## 2022-11-27 NOTE — Progress Notes (Signed)
Careteam: Patient Care Team: Paige Heir, NP as PCP - General (Adult Health Nurse Practitioner) Rollene Rotunda, MD as PCP - Cardiology (Cardiology) Cherlyn Roberts, MD as Consulting Physician (Dermatology) Holli Humbles, MD as Referring Physician (Ophthalmology) Butch Penny, NP as Nurse Practitioner (Neurology) Noel Christmas, MD as Consulting Physician (Urology)  Seen by: Hazle Nordmann, AGNP-C  PLACE OF SERVICE:  [facility]  Advanced Directive information    Allergies  Allergen Reactions   Morphine Other (See Comments)    REACTION: lethargy/malaise   Metoclopramide Hcl Anxiety and Other (See Comments)    REACTION: anxious and out of her mind   Aricept [Donepezil Hcl]     nausea   Doxycycline Diarrhea   Penicillins Itching and Rash    Has patient had a PCN reaction causing immediate rash, facial/tongue/throat swelling, SOB or lightheadedness with hypotension: No Has patient had a PCN reaction causing severe rash involving mucus membranes or skin necrosis: No Has patient had a PCN reaction that required hospitalization No Has patient had a PCN reaction occurring within the last 10 years: Yes If all of the above answers are "NO", then may proceed with Cephalosporin use.     Prednisone Itching and Rash   Sulfonamide Derivatives Itching and Rash    Chief Complaint  Patient presents with   Medical Management of Chronic Issues    6 month follow up.    Immunizations    Discuss the need for DTAP vaccine, Influenza vaccine, and Covid Booster.     HPI: Patient is a 82 y.o. female seen today for medical management of chronic conditions.   Recent labs discussed.   She continues to c/o brain fog and memory issues. Followed by Dr. Terrace Arabia. She has trouble asking some questions today. Appears overwhelmed. She changes subject often and had trouble focusing on one subject at a time. She normally brings list of questions to ask and we go through questions one at a time.  She did not bring list today. MOCA 21/30 05/09/2022. 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 I spoke with daughter, Paige Pena after encounter. Appears she did not give herself enough time to get ready before appointment today and felt rushed. Daughter calls daily to help with daily schedule. She has also been having issues with her vision and hearing. Followed by Dr. Valere Dross, she is now wearing contact lense daily. It does take her time to place. A few months back she had issues with her hearing. She was found to have a lot of wax buildup> resolved with Debrox. Today she reports increased ear fullness and seasonal allergies. She takes Zyrtec daily.   Daughter interested in setting her up with talk therapy. Family plans to call some places in town. She is on Cymbalta and hydroxyzine prn.   A1c 5.8. Admits to liking sweets.   Weights stable.   No recent falls or injuries.   Flu vaccine given today.   Review of Systems:  Review of Systems  Constitutional: Negative.   HENT:  Positive for hearing loss. Negative for ear pain.   Eyes:  Positive for blurred vision.  Respiratory: Negative.    Cardiovascular: Negative.   Gastrointestinal: Negative.   Genitourinary: Negative.   Musculoskeletal: Negative.   Skin: Negative.   Neurological: Negative.   Psychiatric/Behavioral:  Positive for depression and memory loss. The patient is nervous/anxious.     Past Medical History:  Diagnosis Date   Acute cystitis  Anxiety    Aphasia    transsient aphasia Jan 2012   Asthmatic bronchitis    Cerebrovascular disease    Diverticulosis of colon    DJD (degenerative joint disease)    Dysphonia    Esophageal stricture    Fibromyalgia    Gastroparesis    GERD (gastroesophageal reflux disease)    Hematuria, microscopic    Hemorrhoids    Hypercholesteremia    IBS (irritable bowel syndrome)    Memory deficits 02/15/2013   RLS  (restless legs syndrome)    TIA (transient ischemic attack)    Urinary incontinence    Vitamin D deficiency    Past Surgical History:  Procedure Laterality Date   ABDOMINAL HYSTERECTOMY  02/24/1993   cataract surgery Right 12/19/2021   corneal transplant for keratonconus     EYE SURGERY  02/25/2011   left foot fracture with ORIF  07/25/2004   Dr. Lajoyce Corners   right bunion surgery     Social History:   reports that she has never smoked. She has never used smokeless tobacco. She reports current alcohol use. She reports that she does not use drugs.  Family History  Problem Relation Age of Onset   Hypertension Mother    Anemia Mother    Dementia Mother    Kidney disease Father    Hyperlipidemia Sister    Diabetes Brother    Lung cancer Brother    Dementia Maternal Grandmother    Diabetes Other        aunt,brother,uncle   Breast cancer Maternal Aunt     Medications: Patient's Medications  New Prescriptions   No medications on file  Previous Medications   ASPIRIN 325 MG EC TABLET    Take 325 mg by mouth daily.   ATORVASTATIN (LIPITOR) 20 MG TABLET    Take 1 tablet (20 mg total) by mouth every Monday, Wednesday, and Friday.   BIOTIN 1000 MCG TABLET    Take 1,000 mcg by mouth 3 (three) times daily.   CHOLECALCIFEROL (VITAMIN D) 50 MCG (2000 UT) CAPS    Take by mouth.   COLLAGEN-VITAMIN C PO    Take by mouth.   CYANOCOBALAMIN (VITAMIN B12) 1000 MCG TABLET    Take 1 tablet (1,000 mcg total) by mouth daily.   DULOXETINE (CYMBALTA) 60 MG CAPSULE    Take 1 capsule (60 mg total) by mouth daily.   FLUTICASONE (FLONASE) 50 MCG/ACT NASAL SPRAY    Place 2 sprays into both nostrils daily as needed for allergies.   GABAPENTIN (NEURONTIN) 100 MG CAPSULE    Take 1 capsule (100 mg total) by mouth 3 (three) times daily as needed.   HYDROXYZINE (VISTARIL) 25 MG CAPSULE    Take 1 capsule (25 mg total) by mouth daily as needed. For panic attack or increased anxiety   OMEGA-3 FATTY ACIDS (FISH OIL)  1000 MG CAPS    Take by mouth.   OMEPRAZOLE (PRILOSEC) 40 MG CAPSULE    TAKE 1 CAPSULE TWICE DAILY   PREDNISOLONE ACETATE (PRED FORTE) 1 % OPHTHALMIC SUSPENSION    Place 1 drop into both eyes as directed.  Modified Medications   No medications on file  Discontinued Medications   No medications on file    Physical Exam:  There were no vitals filed for this visit. There is no height or weight on file to calculate BMI. Wt Readings from Last 3 Encounters:  08/20/22 118 lb (53.5 kg)  07/17/22 115 lb 12.8 oz (52.5 kg)  07/17/22 114  lb 12.8 oz (52.1 kg)    Physical Exam Vitals reviewed.  Constitutional:      General: She is not in acute distress. HENT:     Head: Normocephalic.     Right Ear: Tympanic membrane normal. There is no impacted cerumen.     Left Ear: Tympanic membrane normal. There is no impacted cerumen.     Nose: Nose normal.     Mouth/Throat:     Mouth: Mucous membranes are moist.  Eyes:     General:        Right eye: No discharge.        Left eye: No discharge.  Cardiovascular:     Rate and Rhythm: Normal rate and regular rhythm.     Pulses: Normal pulses.     Heart sounds: Normal heart sounds.  Pulmonary:     Effort: Pulmonary effort is normal.     Breath sounds: Normal breath sounds.  Abdominal:     General: Bowel sounds are normal. There is no distension.     Palpations: Abdomen is soft.     Tenderness: There is no abdominal tenderness.  Musculoskeletal:     Cervical back: Neck supple.     Right lower leg: No edema.     Left lower leg: No edema.  Skin:    General: Skin is warm.     Capillary Refill: Capillary refill takes less than 2 seconds.  Neurological:     General: No focal deficit present.     Mental Status: She is alert. Mental status is at baseline.     Motor: No weakness.     Gait: Gait normal.  Psychiatric:        Mood and Affect: Mood normal.     Labs reviewed: Basic Metabolic Panel: Recent Labs    04/18/22 1613 05/22/22 0928  07/17/22 1331 11/24/22 1040  NA  --  143 139 140  K  --  4.0 4.6 3.9  CL  --  104 102 103  CO2  --  30 31 27   GLUCOSE  --  103* 87 93  BUN  --  13 13 22   CREATININE  --  0.90 0.82 0.88  CALCIUM  --  9.2 9.0 9.2  TSH 1.07  --   --   --    Liver Function Tests: Recent Labs    05/22/22 0928 07/17/22 1331 11/24/22 1040  AST 20 22 18   ALT 14 16 10   BILITOT 0.4 0.5 0.3  PROT 6.7 6.3 6.9   No results for input(s): "LIPASE", "AMYLASE" in the last 8760 hours. No results for input(s): "AMMONIA" in the last 8760 hours. CBC: Recent Labs    05/22/22 0928 07/17/22 1331 11/24/22 1040  WBC 3.5* 4.4 5.7  NEUTROABS 1,383* 2,715 3,414  HGB 12.9 12.9 12.8  HCT 38.8 38.4 40.1  MCV 90.4 90.6 92.8  PLT 171 160 246   Lipid Panel: Recent Labs    05/22/22 0928  CHOL 168  HDL 89  LDLCALC 63  TRIG 77  CHOLHDL 1.9   TSH: Recent Labs    04/18/22 1613  TSH 1.07   A1C: Lab Results  Component Value Date   HGBA1C 5.8 (H) 11/24/2022     Assessment/Plan 1. Essential hypertension - controlled, goal < 140/90 - BUN/creat 22/0.88 11/24/2022 - rechecked pressure at goal - not on medication  2. Pure hypercholesterolemia - total 168, LDL 63> at goal - cont atorvastatin - lipid panel- future  3. Cognitive impairment -  followed by Dr. 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 Prediabetes - A1c 5.8 - cont diet that limits carbs and sweets  5. Need for influenza vaccination - Flu Vaccine Trivalent High Dose (Fluad)  6. Sensation of ear fullness - exam  unremarkable - suspect related to seasonal allergies - recommend starting Claritin x 2 weeks and stopping Zyrtec   7. Anxiety and depression - very pleasant  - supportive family> plan to enroll her into talk therapy  - cont Cymbalta and hydroxyzine prn  Total time: 38 minutes. Greater than 50% of total time spent doing patient education regarding health maintenance, memory issues, lab work, HTN/HLD, depression and anxiety including symptom/medication management.    Next appt: 05/07/2023  Hazle Nordmann, Juel Burrow  [facility] [phone]

## 2022-11-28 ENCOUNTER — Encounter: Payer: Self-pay | Admitting: Orthopedic Surgery

## 2022-12-11 ENCOUNTER — Encounter (INDEPENDENT_AMBULATORY_CARE_PROVIDER_SITE_OTHER): Payer: Medicare HMO | Admitting: Orthopedic Surgery

## 2022-12-11 ENCOUNTER — Ambulatory Visit (INDEPENDENT_AMBULATORY_CARE_PROVIDER_SITE_OTHER): Payer: Medicare HMO | Admitting: Orthopedic Surgery

## 2022-12-11 ENCOUNTER — Encounter: Payer: Self-pay | Admitting: Orthopedic Surgery

## 2022-12-11 VITALS — BP 140/70 | HR 77 | Temp 97.3°F | Resp 16 | Ht 61.0 in | Wt 113.8 lb

## 2022-12-11 DIAGNOSIS — R0982 Postnasal drip: Secondary | ICD-10-CM

## 2022-12-11 DIAGNOSIS — R058 Other specified cough: Secondary | ICD-10-CM

## 2022-12-11 NOTE — Progress Notes (Signed)
Careteam: Patient Care Team: [name], NP as PCP - General (Adult Health Nurse Practitioner) Rollene Rotunda, MD as PCP - Cardiology (Cardiology) Cherlyn Roberts, MD as Consulting Physician (Dermatology) Holli Humbles, MD as Referring Physician (Ophthalmology) Butch Penny, NP as Nurse Practitioner (Neurology) Noel Christmas, MD as Consulting Physician (Urology)  Seen by: Hazle Nordmann, AGNP-C  PLACE OF SERVICE:  [facility]  Advanced Directive information Does Patient Have a Medical Advance Directive?: No, Would patient like information on creating a medical advance directive?: No - Patient declined  Allergies  Allergen Reactions   Morphine Other (See Comments)    REACTION: lethargy/malaise   Metoclopramide Hcl Anxiety and Other (See Comments)    REACTION: anxious and out of her mind   Aricept [Donepezil Hcl]     nausea   Doxycycline Diarrhea   Penicillins Itching and Rash    Has patient had a PCN reaction causing immediate rash, facial/tongue/throat swelling, SOB or lightheadedness with hypotension: No Has patient had a PCN reaction causing severe rash involving mucus membranes or skin necrosis: No Has patient had a PCN reaction that required hospitalization No Has patient had a PCN reaction occurring within the last 10 years: Yes If all of the above answers are "NO", then may proceed with Cephalosporin use.     Prednisone Itching and Rash   Sulfonamide Derivatives Itching and Rash    Chief Complaint  Patient presents with   Acute Visit    Patient complains of chest congestion, post nasal drip, and dry cough. Symptoms started Sunday 12/07/2022.     HPI: Patient is a 82 y.o. female seen today for acute visit due to chest and nasal congestion.   10/03 she was advised to switch antihistamine due to increased nasal congestion and ear fullness. She has been taking Claritin x 2 weeks. Today, she returns with same symptoms. Nasal congestion is clear. Also  reports dry cough, sometimes productive. Denies fever, body aches, sore throat and no facial or ear pain. She has not tried any other OTC remedies. No sick contacts. UTD on flu vaccine. She has not received covid booster.    Review of Systems:  Review of Systems  Constitutional:  Negative for chills and fever.  HENT:  Positive for congestion. Negative for sinus pain and sore throat.   Respiratory:  Positive for cough and sputum production. Negative for shortness of breath and wheezing.   Cardiovascular:  Negative for chest pain and leg swelling.  Gastrointestinal:  Negative for nausea and vomiting.  Musculoskeletal:  Negative for myalgias.  Neurological:  Negative for dizziness and headaches.  Psychiatric/Behavioral:  Positive for depression and memory loss. The patient is nervous/anxious.     Past Medical History:  Diagnosis Date   Acute cystitis    Anxiety    Aphasia    transsient aphasia Jan 2012   Asthmatic bronchitis    Cerebrovascular disease    Diverticulosis of colon    DJD (degenerative joint disease)    Dysphonia    Esophageal stricture    Fibromyalgia    Gastroparesis    GERD (gastroesophageal reflux disease)    Hematuria, microscopic    Hemorrhoids    Hypercholesteremia    IBS (irritable bowel syndrome)    Memory deficits 02/15/2013   RLS (restless legs syndrome)    TIA (transient ischemic attack)    Urinary incontinence    Vitamin D deficiency    Past Surgical History:  Procedure Laterality Date   ABDOMINAL HYSTERECTOMY  02/24/1993  cataract surgery Right 12/19/2021   corneal transplant for keratonconus     EYE SURGERY  02/25/2011   left foot fracture with ORIF  07/25/2004   Dr. Lajoyce Corners   right bunion surgery     Social History:   reports that she has never smoked. She has never used smokeless tobacco. She reports current alcohol use. She reports that she does not use drugs.  Family History  Problem Relation Age of Onset   Hypertension Mother     Anemia Mother    Dementia Mother    Kidney disease Father    Hyperlipidemia Sister    Diabetes Brother    Lung cancer Brother    Dementia Maternal Grandmother    Diabetes Other        aunt,brother,uncle   Breast cancer Maternal Aunt     Medications: Patient's Medications  New Prescriptions   No medications on file  Previous Medications   ASPIRIN 325 MG EC TABLET    Take 325 mg by mouth daily.   ATORVASTATIN (LIPITOR) 20 MG TABLET    Take 1 tablet (20 mg total) by mouth every Monday, Wednesday, and Friday.   BIOTIN 1000 MCG TABLET    Take 1,000 mcg by mouth 3 (three) times daily.   CHOLECALCIFEROL (VITAMIN D) 50 MCG (2000 UT) CAPS    Take by mouth.   COLLAGEN-VITAMIN C PO    Take by mouth.   CYANOCOBALAMIN (VITAMIN B12) 1000 MCG TABLET    Take 1 tablet (1,000 mcg total) by mouth daily.   DULOXETINE (CYMBALTA) 60 MG CAPSULE    Take 1 capsule (60 mg total) by mouth daily.   FLUTICASONE (FLONASE) 50 MCG/ACT NASAL SPRAY    Place 2 sprays into both nostrils daily as needed for allergies.   HYDROXYZINE (VISTARIL) 25 MG CAPSULE    Take 1 capsule (25 mg total) by mouth daily as needed. For panic attack or increased anxiety   OMEGA-3 FATTY ACIDS (FISH OIL) 1000 MG CAPS    Take by mouth.   OMEPRAZOLE (PRILOSEC) 40 MG CAPSULE    TAKE 1 CAPSULE TWICE DAILY   PREDNISOLONE ACETATE (PRED FORTE) 1 % OPHTHALMIC SUSPENSION    Place 1 drop into both eyes as directed.  Modified Medications   No medications on file  Discontinued Medications   No medications on file    Physical Exam:  Vitals:   12/11/22 1432  BP: (!) 140/70  Resp: 16  Temp: (!) 97.3 F (36.3 C)  Weight: 113 lb 12.8 oz (51.6 kg)  Height: 5\' 1"  (1.549 m)   Body mass index is 21.5 kg/m. Wt Readings from Last 3 Encounters:  12/11/22 113 lb 12.8 oz (51.6 kg)  11/27/22 113 lb 6.4 oz (51.4 kg)  08/20/22 118 lb (53.5 kg)    Physical Exam Vitals reviewed.  Constitutional:      General: She is not in acute distress. HENT:      Head: Normocephalic.     Right Ear: There is no impacted cerumen.     Left Ear: There is no impacted cerumen.     Nose: Rhinorrhea present.     Right Turbinates: Enlarged.     Left Turbinates: Enlarged.     Mouth/Throat:     Pharynx: No oropharyngeal exudate or posterior oropharyngeal erythema.  Eyes:     General:        Right eye: No discharge.        Left eye: No discharge.  Cardiovascular:  Rate and Rhythm: Normal rate and regular rhythm.     Pulses: Normal pulses.     Heart sounds: Normal heart sounds.  Pulmonary:     Effort: Pulmonary effort is normal. No respiratory distress.     Breath sounds: Normal breath sounds. No wheezing or rales.  Abdominal:     General: Bowel sounds are normal.     Palpations: Abdomen is soft.  Musculoskeletal:     Cervical back: Neck supple.     Right lower leg: No edema.     Left lower leg: No edema.  Lymphadenopathy:     Cervical: No cervical adenopathy.  Skin:    General: Skin is warm.     Capillary Refill: Capillary refill takes less than 2 seconds.  Neurological:     General: No focal deficit present.     Mental Status: She is alert and oriented to person, place, and time.  Psychiatric:        Mood and Affect: Mood normal.     Labs reviewed: Basic Metabolic Panel: Recent Labs    04/18/22 1613 05/22/22 0928 07/17/22 1331 11/24/22 1040  NA  --  143 139 140  K  --  4.0 4.6 3.9  CL  --  104 102 103  CO2  --  30 31 27   GLUCOSE  --  103* 87 93  BUN  --  13 13 22   CREATININE  --  0.90 0.82 0.88  CALCIUM  --  9.2 9.0 9.2  TSH 1.07  --   --   --    Liver Function Tests: Recent Labs    05/22/22 0928 07/17/22 1331 11/24/22 1040  AST 20 22 18   ALT 14 16 10   BILITOT 0.4 0.5 0.3  PROT 6.7 6.3 6.9   No results for input(s): "LIPASE", "AMYLASE" in the last 8760 hours. No results for input(s): "AMMONIA" in the last 8760 hours. CBC: Recent Labs    05/22/22 0928 07/17/22 1331 11/24/22 1040  WBC 3.5* 4.4 5.7   NEUTROABS 1,383* 2,715 3,414  HGB 12.9 12.9 12.8  HCT 38.8 38.4 40.1  MCV 90.4 90.6 92.8  PLT 171 160 246   Lipid Panel: Recent Labs    05/22/22 0928  CHOL 168  HDL 89  LDLCALC 63  TRIG 77  CHOLHDL 1.9   TSH: Recent Labs    04/18/22 1613  TSH 1.07   A1C: Lab Results  Component Value Date   HGBA1C 5.8 (H) 11/24/2022     Assessment/Plan 1. Post-nasal drip - ongoing  - unsuccessful trial Claritin - nasal turbinates swollen, no sinus pain  - allergy to steroids> unable to try Flonase - recommend switching to Xyzal and using saline nasal spray as needed - advised to change filters in home  - consider humidifier at bedtime - POC COVID-19> negative - POC Influenza A/B> negative  2. Dry cough - suspect from post nasal drip  - lung sounds clear  - see above - POC COVID-19> negative - POC Influenza A/B> negative   Total time: 26 minutes. Greater than 50% of total time spent doing patient education regarding post nasal drip and dry cough including symptom/medication management.     Next appt: 05/07/2023  Hazle Nordmann, Juel Burrow  [facility] [phone]

## 2022-12-11 NOTE — Patient Instructions (Addendum)
Change to Xyzal antihistamine- take one pill at night  Saline nasal spray- spray the sides on your nose> you can do as many times a daily as you would like  Changed air filters in home   Recommend humidifier at bedtime

## 2022-12-15 LAB — POC COVID19 BINAXNOW: SARS Coronavirus 2 Ag: NEGATIVE

## 2022-12-15 LAB — POCT INFLUENZA A/B
Influenza A, POC: NEGATIVE
Influenza B, POC: NEGATIVE

## 2022-12-16 DIAGNOSIS — M47812 Spondylosis without myelopathy or radiculopathy, cervical region: Secondary | ICD-10-CM | POA: Diagnosis not present

## 2022-12-16 DIAGNOSIS — R251 Tremor, unspecified: Secondary | ICD-10-CM | POA: Diagnosis not present

## 2022-12-17 NOTE — Progress Notes (Signed)
This encounter was created in error - please disregard.

## 2022-12-30 ENCOUNTER — Other Ambulatory Visit: Payer: Self-pay | Admitting: Orthopedic Surgery

## 2022-12-30 DIAGNOSIS — F32A Depression, unspecified: Secondary | ICD-10-CM

## 2022-12-30 DIAGNOSIS — F419 Anxiety disorder, unspecified: Secondary | ICD-10-CM

## 2023-01-07 DIAGNOSIS — Z961 Presence of intraocular lens: Secondary | ICD-10-CM | POA: Diagnosis not present

## 2023-01-07 DIAGNOSIS — H2512 Age-related nuclear cataract, left eye: Secondary | ICD-10-CM | POA: Diagnosis not present

## 2023-01-07 DIAGNOSIS — Z947 Corneal transplant status: Secondary | ICD-10-CM | POA: Diagnosis not present

## 2023-01-07 DIAGNOSIS — H52213 Irregular astigmatism, bilateral: Secondary | ICD-10-CM | POA: Diagnosis not present

## 2023-01-08 ENCOUNTER — Telehealth: Payer: Self-pay | Admitting: Neurology

## 2023-01-08 DIAGNOSIS — M4692 Unspecified inflammatory spondylopathy, cervical region: Secondary | ICD-10-CM

## 2023-01-08 DIAGNOSIS — R202 Paresthesia of skin: Secondary | ICD-10-CM

## 2023-01-08 NOTE — Telephone Encounter (Signed)
Call to patient who is also having tingling in the left side of body including leg, foot and hand tingling with discomfort in the thigh. Patient reports no use of cane or walker and no falls but numbness in foot may cause her to stumble every now and then. Current dosing of Gabapentin is 100 mg TID, still taking Cymbalta 60 mg daily. Patient is trying to see if another medication is available or if therapy may help.

## 2023-01-08 NOTE — Telephone Encounter (Signed)
Pt reports that the Gabapentin is not helping with her Neuropathy. There is cramping in her left leg, there is tingling and numbness.  Pt wants to know if Dr Terrace Arabia will consider calling in something else since due to the heaviness in leg pt from time to time feels she may stumble, please call pt to discuss.

## 2023-01-08 NOTE — Telephone Encounter (Signed)
Over the past few weeks, she had frequent sometimes painful left lower extremity muscle spasm, is already on Cymbalta 60 mg daily, tried gabapentin 100 mg without significant improvement, higher dose make her dizzy,  I have advised her taking higher dose of gabapentin 2 to 3 tablets only as needed during the spells, may combine with Aleve, stretch, warm compression

## 2023-01-16 ENCOUNTER — Other Ambulatory Visit: Payer: Self-pay | Admitting: Internal Medicine

## 2023-01-16 DIAGNOSIS — E785 Hyperlipidemia, unspecified: Secondary | ICD-10-CM

## 2023-02-05 ENCOUNTER — Encounter: Payer: Self-pay | Admitting: Orthopedic Surgery

## 2023-02-05 ENCOUNTER — Ambulatory Visit (INDEPENDENT_AMBULATORY_CARE_PROVIDER_SITE_OTHER): Payer: Medicare HMO | Admitting: Orthopedic Surgery

## 2023-02-05 VITALS — BP 140/72 | HR 84 | Temp 97.8°F | Resp 16 | Ht 61.0 in | Wt 115.4 lb

## 2023-02-05 DIAGNOSIS — R0982 Postnasal drip: Secondary | ICD-10-CM

## 2023-02-05 DIAGNOSIS — H9313 Tinnitus, bilateral: Secondary | ICD-10-CM | POA: Diagnosis not present

## 2023-02-05 DIAGNOSIS — R42 Dizziness and giddiness: Secondary | ICD-10-CM | POA: Diagnosis not present

## 2023-02-05 DIAGNOSIS — E010 Iodine-deficiency related diffuse (endemic) goiter: Secondary | ICD-10-CM

## 2023-02-05 DIAGNOSIS — R030 Elevated blood-pressure reading, without diagnosis of hypertension: Secondary | ICD-10-CM

## 2023-02-05 NOTE — Progress Notes (Signed)
Careteam: Patient Care Team: Paige Heir, NP as PCP - General (Adult Health Nurse Practitioner) Rollene Rotunda, MD as PCP - Cardiology (Cardiology) Cherlyn Roberts, MD as Consulting Physician (Dermatology) Holli Humbles, MD as Referring Physician (Ophthalmology) Butch Penny, NP as Nurse Practitioner (Neurology) Noel Christmas, MD as Consulting Physician (Urology)  Seen by: Hazle Nordmann, AGNP-C  PLACE OF SERVICE:  [facility]  Advanced Directive information Does Patient Have a Medical Advance Directive?: No, Would patient like information on creating a medical advance directive?: No - Patient declined  Allergies  Allergen Reactions   Morphine Other (See Comments)    REACTION: lethargy/malaise   Metoclopramide Hcl Anxiety and Other (See Comments)    REACTION: anxious and out of her mind   Aricept [Donepezil Hcl]     nausea   Doxycycline Diarrhea   Penicillins Itching and Rash    Has patient had a PCN reaction causing immediate rash, facial/tongue/throat swelling, SOB or lightheadedness with hypotension: No Has patient had a PCN reaction causing severe rash involving mucus membranes or skin necrosis: No Has patient had a PCN reaction that required hospitalization No Has patient had a PCN reaction occurring within the last 10 years: Yes If all of the above answers are "NO", then may proceed with Cephalosporin use.     Prednisone Itching and Rash   Sulfonamide Derivatives Itching and Rash    Chief Complaint  Patient presents with   Acute Visit    Patient complains of ringing in ears, lightheadedness, and lump in throat.      HPI: Patient is a 82 y.o. female seen today for acute visit due to ongoing tinnitus, lightheadedness and lump in throat.   Tinnitus- occurring almost daily, involves both ears, leaning forward increases symptoms, does not know relieving factors. H/o cerumen impaction.   Chronic post nasal drip- > 3 months, using Xyzal and Flonase  without success, also has saline nasal spray, denies increased allergies  Lightheadedness- ongoing, she is taking gabapentin once daily, denies black stools or pink urine, discussed trying " drug holiday" to see if symptoms improve  Lump in throat- first noticed about 1 month ago, sometimes having trouble swallowing pills, no issues swallowing foods or fluids, no choking episodes  Elevated blood pressure- SBP 140's per patient and daughter, asymptomatic, not on antihypertensive, h/o anxiety  Poor historian at times. Discussed plan of care with daughter, [name] after encounter. She agrees with plan.   Review of Systems:  Review of Systems  Constitutional:  Negative for fever.  HENT:  Positive for congestion and tinnitus. Negative for ear discharge, ear pain, sinus pain and sore throat.   Respiratory:  Negative for sputum production.   Cardiovascular:  Negative for chest pain.  Gastrointestinal: Negative.   Genitourinary: Negative.   Musculoskeletal:  Negative for falls.  Neurological:  Positive for dizziness and sensory change. Negative for headaches.  Psychiatric/Behavioral:  Positive for memory loss. Negative for depression. The patient is nervous/anxious. The patient does not have insomnia.     Past Medical History:  Diagnosis Date   Acute cystitis    Anxiety    Aphasia    transsient aphasia Jan 2012   Asthmatic bronchitis    Cerebrovascular disease    Diverticulosis of colon    DJD (degenerative joint disease)    Dysphonia    Esophageal stricture    Fibromyalgia    Gastroparesis    GERD (gastroesophageal reflux disease)    Hematuria, microscopic    Hemorrhoids  Hypercholesteremia    IBS (irritable bowel syndrome)    Memory deficits 02/15/2013   RLS (restless legs syndrome)    TIA (transient ischemic attack)    Urinary incontinence    Vitamin D deficiency    Past Surgical History:  Procedure Laterality Date   ABDOMINAL HYSTERECTOMY  02/24/1993   cataract surgery  Right 12/19/2021   corneal transplant for keratonconus     EYE SURGERY  02/25/2011   left foot fracture with ORIF  07/25/2004   Dr. Lajoyce Corners   right bunion surgery     Social History:   reports that she has never smoked. She has never used smokeless tobacco. She reports current alcohol use. She reports that she does not use drugs.  Family History  Problem Relation Age of Onset   Hypertension Mother    Anemia Mother    Dementia Mother    Kidney disease Father    Hyperlipidemia Sister    Diabetes Brother    Lung cancer Brother    Dementia Maternal Grandmother    Diabetes Other        aunt,brother,uncle   Breast cancer Maternal Aunt     Medications: Patient's Medications  New Prescriptions   No medications on file  Previous Medications   ASPIRIN 325 MG EC TABLET    Take 325 mg by mouth daily.   ATORVASTATIN (LIPITOR) 20 MG TABLET    TAKE 1 TABLET BY MOUTH EVERY MONDAY,WEDNESDAY AND FRIDAY   BIOTIN 1000 MCG TABLET    Take 1,000 mcg by mouth 3 (three) times daily.   CHOLECALCIFEROL (VITAMIN D) 50 MCG (2000 UT) CAPS    Take by mouth.   COLLAGEN-VITAMIN C PO    Take by mouth.   CYANOCOBALAMIN (VITAMIN B12) 1000 MCG TABLET    Take 1 tablet (1,000 mcg total) by mouth daily.   DULOXETINE (CYMBALTA) 60 MG CAPSULE    TAKE 1 CAPSULE BY MOUTH ONCE DAILY   FLUTICASONE (FLONASE) 50 MCG/ACT NASAL SPRAY    Place 2 sprays into both nostrils daily as needed for allergies.   HYDROXYZINE (VISTARIL) 25 MG CAPSULE    Take 1 capsule (25 mg total) by mouth daily as needed. For panic attack or increased anxiety   OMEGA-3 FATTY ACIDS (FISH OIL) 1000 MG CAPS    Take by mouth.   OMEPRAZOLE (PRILOSEC) 40 MG CAPSULE    TAKE 1 CAPSULE TWICE DAILY   PREDNISOLONE ACETATE (PRED FORTE) 1 % OPHTHALMIC SUSPENSION    Place 1 drop into both eyes as directed.  Modified Medications   No medications on file  Discontinued Medications   No medications on file    Physical Exam:  Vitals:   02/05/23 1457  BP: (!)  140/72  Pulse: 84  Resp: 16  Temp: 97.8 F (36.6 C)  SpO2: 97%  Weight: 115 lb 6.4 oz (52.3 kg)  Height: 5\' 1"  (1.549 m)   Body mass index is 21.8 kg/m. Wt Readings from Last 3 Encounters:  02/05/23 115 lb 6.4 oz (52.3 kg)  12/11/22 113 lb 12.8 oz (51.6 kg)  11/27/22 113 lb 6.4 oz (51.4 kg)    Physical Exam Vitals reviewed.  Constitutional:      General: She is not in acute distress. HENT:     Head: Normocephalic.     Right Ear: There is impacted cerumen.     Left Ear: There is no impacted cerumen.     Nose: Rhinorrhea present.     Right Turbinates: Enlarged.  Left Turbinates: Enlarged.  Eyes:     General:        Right eye: No discharge.        Left eye: No discharge.     Extraocular Movements: Extraocular movements intact.     Pupils: Pupils are equal, round, and reactive to light.  Neck:     Thyroid: Thyromegaly present.  Cardiovascular:     Rate and Rhythm: Normal rate and regular rhythm.     Pulses: Normal pulses.     Heart sounds: Normal heart sounds.  Pulmonary:     Effort: Pulmonary effort is normal. No respiratory distress.     Breath sounds: Normal breath sounds. No wheezing.  Abdominal:     General: Bowel sounds are normal.     Palpations: Abdomen is soft.  Musculoskeletal:     Cervical back: Neck supple.     Right lower leg: No edema.     Left lower leg: No edema.  Skin:    General: Skin is warm.  Neurological:     General: No focal deficit present.     Mental Status: She is alert and oriented to person, place, and time.     Motor: No weakness.     Gait: Gait normal.  Psychiatric:        Mood and Affect: Mood normal.     Labs reviewed: Basic Metabolic Panel: Recent Labs    04/18/22 1613 05/22/22 0928 07/17/22 1331 11/24/22 1040  NA  --  143 139 140  K  --  4.0 4.6 3.9  CL  --  104 102 103  CO2  --  30 31 27   GLUCOSE  --  103* 87 93  BUN  --  13 13 22   CREATININE  --  0.90 0.82 0.88  CALCIUM  --  9.2 9.0 9.2  TSH 1.07  --   --    --    Liver Function Tests: Recent Labs    05/22/22 0928 07/17/22 1331 11/24/22 1040  AST 20 22 18   ALT 14 16 10   BILITOT 0.4 0.5 0.3  PROT 6.7 6.3 6.9   No results for input(s): "LIPASE", "AMYLASE" in the last 8760 hours. No results for input(s): "AMMONIA" in the last 8760 hours. CBC: Recent Labs    05/22/22 0928 07/17/22 1331 11/24/22 1040  WBC 3.5* 4.4 5.7  NEUTROABS 1,383* 2,715 3,414  HGB 12.9 12.9 12.8  HCT 38.8 38.4 40.1  MCV 90.4 90.6 92.8  PLT 171 160 246   Lipid Panel: Recent Labs    05/22/22 0928  CHOL 168  HDL 89  LDLCALC 63  TRIG 77  CHOLHDL 1.9   TSH: Recent Labs    04/18/22 1613  TSH 1.07   A1C: Lab Results  Component Value Date   HGBA1C 5.8 (H) 11/24/2022     Assessment/Plan 1. Post-nasal drip (Primary) - ongoing > 3 months - swollen turbinates - cont xyzal and Flonase - Ambulatory referral to ENT  2. Thyromegaly - noted on exam  - TSH 1.07 04/18/2022 - US THYROID; Future  3. Tinnitus of both ears - ongoing - on antihistamine  - ENT referral made   4. Lightheadedness - exam unremarkable - blood pressure stable - suspect from gabapentin - advised to hold gabapentin a few days to see if symptoms improve  5. Elevated blood pressure reading - BUN/creat 22/0.88 11/24/2022 - SBP trending 140's - asymptomatic - plan to check home pressures BID x 14 days - reschedule f/u in  4 weeks - recommend low sodium diet  Total time: 36 minutes. Greater than 50% of total time spent doing patient education regarding post nasal drip, tinnitus, lightheadedness and elevated blood pressures including symptom/medication management.    Next appt: 03/12/2023  Hazle Nordmann, Juel Burrow  [facility] [phone]

## 2023-02-05 NOTE — Patient Instructions (Addendum)
Please come to appointments on time  Stop gabapentin for a few days to see if "lightheadedness" or "fogginess" improves> you may have increased numbness to lower leg stopping gabapentin  Recommend saline nasal spray over the counter for runny nose   May try yellow mustard for muscle cramps> you may also consider magnesium glycinate supplement over the counter- take as prescribed on bottle at night   Try taking medications with applesauce, tuck your chin when swallowing

## 2023-02-09 ENCOUNTER — Ambulatory Visit: Payer: Medicare HMO

## 2023-02-10 ENCOUNTER — Ambulatory Visit
Admission: RE | Admit: 2023-02-10 | Discharge: 2023-02-10 | Disposition: A | Discharge status: Home / Self Care | Payer: Medicare HMO | Source: Ambulatory Visit | Attending: Orthopedic Surgery | Admitting: Orthopedic Surgery

## 2023-02-10 DIAGNOSIS — E010 Iodine-deficiency related diffuse (endemic) goiter: Secondary | ICD-10-CM

## 2023-02-10 DIAGNOSIS — E0789 Other specified disorders of thyroid: Secondary | ICD-10-CM | POA: Diagnosis not present

## 2023-03-09 ENCOUNTER — Telehealth (INDEPENDENT_AMBULATORY_CARE_PROVIDER_SITE_OTHER): Payer: Self-pay | Admitting: Otolaryngology

## 2023-03-09 NOTE — Telephone Encounter (Signed)
 LVM to confirm appt & location [phone] afm

## 2023-03-10 ENCOUNTER — Institutional Professional Consult (permissible substitution) (INDEPENDENT_AMBULATORY_CARE_PROVIDER_SITE_OTHER): Payer: Medicare HMO

## 2023-03-10 ENCOUNTER — Ambulatory Visit (INDEPENDENT_AMBULATORY_CARE_PROVIDER_SITE_OTHER): Payer: Medicare HMO | Admitting: Audiology

## 2023-03-10 NOTE — Progress Notes (Deleted)
  [address], [address] [place], [place] [address] [phone]  Audiological Evaluation    Name: Paige Pena     DOB:   1940/12/21      MRN:   [id number]                                                                                     Service Date: 03/10/2023     Accompanied by: ***   Patient comes today after Dr. Tobie, ENT sent a referral for a hearing evaluation due to concerns with {audiology symptoms:31224}.   Symptoms Yes Details  Hearing loss  []    Tinnitus  []    Ear pain/ Ear infections  []    Balance problems  []    Noise exposure  []    Previous ear surgeries  []    Family history  []    Amplification  []    Other  []      Otoscopy: Right ear: {otoscopy:31227} Left ear:  {otoscopy:31227}  Tympanometry: Right ear: {tympanometry results:31367} Left ear: {tympanometry results:31367}    Pure tone Audiometry: Right ear- *** {hearing loss types:31372::sensorineural hearing loss} from *** Hz - *** Hz. Left ear-  *** {hearing loss types:31372::sensorineural hearing loss} from *** Hz - *** Hz.  The hearing test results were completed under {transducer options:31388} and results are deemed to be of {test reliability:31390::good reliability}. Test technique:  {audiometric test technique:31400::conventional}     Speech Audiometry: Right ear- {AUD SRT/SAT:19348} Left ear-{AUD SRT/SAT:19348}   Word Recognition Score Tested using {word lists:31376::NU-6 (MLV)} Right ear: ***% was obtained at a presentation level of *** dBHL with contralateral masking which is deemed as  {word recognition score:31373} Left ear: ***% was obtained at a presentation level of *** dBHL with contralateral masking which is deemed as  {word recognition score:31373}    Impression: {Word recognition Score interpretation:31432::There is not a significant difference in pure-tone thresholds between ears.,There is not a significant difference in the word recognition score in between  ears. }   Recommendations: {Audiology Recommendations:31370::Follow up with ENT as scheduled for today.}   Daryn Hicks MARIE LEROUX-MARTINEZ, AUD

## 2023-03-12 ENCOUNTER — Encounter: Payer: Self-pay | Admitting: Orthopedic Surgery

## 2023-03-12 ENCOUNTER — Ambulatory Visit: Payer: Medicare HMO | Admitting: Orthopedic Surgery

## 2023-03-12 VITALS — BP 138/72 | HR 77 | Temp 97.2°F | Resp 16 | Ht 61.0 in | Wt 116.0 lb

## 2023-03-12 DIAGNOSIS — R0982 Postnasal drip: Secondary | ICD-10-CM | POA: Diagnosis not present

## 2023-03-12 DIAGNOSIS — R030 Elevated blood-pressure reading, without diagnosis of hypertension: Secondary | ICD-10-CM | POA: Diagnosis not present

## 2023-03-12 DIAGNOSIS — H9313 Tinnitus, bilateral: Secondary | ICD-10-CM | POA: Diagnosis not present

## 2023-03-12 DIAGNOSIS — Z23 Encounter for immunization: Secondary | ICD-10-CM

## 2023-03-12 NOTE — Progress Notes (Signed)
Careteam: Patient Care Team: [name], NP as PCP - General (Adult Health Nurse Practitioner) Rollene Rotunda, MD as PCP - Cardiology (Cardiology) Cherlyn Roberts, MD as Consulting Physician (Dermatology) Holli Humbles, MD as Referring Physician (Ophthalmology) Butch Penny, NP as Nurse Practitioner (Neurology) Noel Christmas, MD as Consulting Physician (Urology)  Seen by: Hazle Nordmann, AGNP-C  PLACE OF SERVICE:  [facility]  Advanced Directive information Does Patient Have a Medical Advance Directive?: No, Would patient like information on creating a medical advance directive?: No - Patient declined  Allergies  Allergen Reactions   Morphine Other (See Comments)    REACTION: lethargy/malaise   Metoclopramide Hcl Anxiety and Other (See Comments)    REACTION: anxious and out of her mind   Aricept [Donepezil Hcl]     nausea   Doxycycline Diarrhea   Penicillins Itching and Rash    Has patient had a PCN reaction causing immediate rash, facial/tongue/throat swelling, SOB or lightheadedness with hypotension: No Has patient had a PCN reaction causing severe rash involving mucus membranes or skin necrosis: No Has patient had a PCN reaction that required hospitalization No Has patient had a PCN reaction occurring within the last 10 years: Yes If all of the above answers are "NO", then may proceed with Cephalosporin use.     Prednisone Itching and Rash   Sulfonamide Derivatives Itching and Rash    Chief Complaint  Patient presents with   Follow-up    5 week follow up for blood pressure check.      HPI: Patient is a 83 y.o. female seen today for acute visit due to elevated blood pressure.   She was seen last month for and SBP in 140's. She is not taking antihypertensive medication. She was advised to take home blood pressures twice daily for at least 2 weeks and bring readings next appointment. She did not bring readings today. Reports SBP averaging 120's-130's.  Follows low sodium diet. She denies chest pain, shortness of breath, headaches or blurred vision.   Ongoing post nasal drip, ear fullness and tinnitus. She is scheduled to see ENT 02/18.  She is taking Flonase and xyzal as prescribed. Denies increased symptoms today.   Discussed need for Tdap at local pharmacy.   No recent falls or injuries.   Review of Systems:  Review of Systems  Constitutional:  Negative for fever.  HENT:  Positive for congestion and tinnitus. Negative for ear discharge and sore throat.   Respiratory:  Negative for shortness of breath.   Cardiovascular:  Negative for chest pain and leg swelling.  Gastrointestinal: Negative.   Genitourinary: Negative.   Musculoskeletal:  Negative for falls.  Neurological:  Negative for dizziness, weakness and headaches.  Psychiatric/Behavioral:  Positive for depression and memory loss. The patient is nervous/anxious.     Past Medical History:  Diagnosis Date   Acute cystitis    Anxiety    Aphasia    transsient aphasia Jan 2012   Asthmatic bronchitis    Cerebrovascular disease    Diverticulosis of colon    DJD (degenerative joint disease)    Dysphonia    Esophageal stricture    Fibromyalgia    Gastroparesis    GERD (gastroesophageal reflux disease)    Hematuria, microscopic    Hemorrhoids    Hypercholesteremia    IBS (irritable bowel syndrome)    Memory deficits 02/15/2013   RLS (restless legs syndrome)    TIA (transient ischemic attack)    Urinary incontinence  Vitamin D deficiency    Past Surgical History:  Procedure Laterality Date   ABDOMINAL HYSTERECTOMY  02/24/1993   cataract surgery Right 12/19/2021   corneal transplant for keratonconus     EYE SURGERY  02/25/2011   left foot fracture with ORIF  07/25/2004   Dr. Lajoyce Corners   right bunion surgery     Social History:   reports that she has never smoked. She has never used smokeless tobacco. She reports current alcohol use. She reports that she does not use  drugs.  Family History  Problem Relation Age of Onset   Hypertension Mother    Anemia Mother    Dementia Mother    Kidney disease Father    Hyperlipidemia Sister    Diabetes Brother    Lung cancer Brother    Dementia Maternal Grandmother    Diabetes Other        aunt,brother,uncle   Breast cancer Maternal Aunt     Medications: Patient's Medications  New Prescriptions   No medications on file  Previous Medications   ASPIRIN 325 MG EC TABLET    Take 325 mg by mouth daily.   ATORVASTATIN (LIPITOR) 20 MG TABLET    TAKE 1 TABLET BY MOUTH EVERY MONDAY,WEDNESDAY AND FRIDAY   BIOTIN 1000 MCG TABLET    Take 1,000 mcg by mouth 3 (three) times daily.   CHOLECALCIFEROL (VITAMIN D) 50 MCG (2000 UT) CAPS    Take by mouth.   COLLAGEN-VITAMIN C PO    Take by mouth.   CYANOCOBALAMIN (VITAMIN B12) 1000 MCG TABLET    Take 1 tablet (1,000 mcg total) by mouth daily.   DULOXETINE (CYMBALTA) 60 MG CAPSULE    TAKE 1 CAPSULE BY MOUTH ONCE DAILY   FLUTICASONE (FLONASE) 50 MCG/ACT NASAL SPRAY    Place 2 sprays into both nostrils daily as needed for allergies.   HYDROXYZINE (VISTARIL) 25 MG CAPSULE    Take 1 capsule (25 mg total) by mouth daily as needed. For panic attack or increased anxiety   LEVOCETIRIZINE (XYZAL) 5 MG TABLET    Take 5 mg by mouth every morning.   OMEGA-3 FATTY ACIDS (FISH OIL) 1000 MG CAPS    Take by mouth.   OMEPRAZOLE (PRILOSEC) 40 MG CAPSULE    TAKE 1 CAPSULE TWICE DAILY   PREDNISOLONE ACETATE (PRED FORTE) 1 % OPHTHALMIC SUSPENSION    Place 1 drop into both eyes as directed.  Modified Medications   No medications on file  Discontinued Medications   No medications on file    Physical Exam:  Vitals:   03/12/23 1310  BP: (!) 140/70  Resp: 16  Temp: (!) 97.2 F (36.2 C)  Weight: 116 lb (52.6 kg)  Height: 5\' 1"  (1.549 m)   Body mass index is 21.92 kg/m. Wt Readings from Last 3 Encounters:  03/12/23 116 lb (52.6 kg)  02/05/23 115 lb 6.4 oz (52.3 kg)  12/11/22 113 lb  12.8 oz (51.6 kg)    Physical Exam Vitals reviewed.  Constitutional:      General: She is not in acute distress. HENT:     Head: Normocephalic.     Right Ear: There is impacted cerumen.     Left Ear: There is impacted cerumen.     Nose: Rhinorrhea present.     Mouth/Throat:     Mouth: Mucous membranes are moist.  Eyes:     General:        Right eye: No discharge.  Left eye: No discharge.  Cardiovascular:     Rate and Rhythm: Normal rate and regular rhythm.     Pulses: Normal pulses.     Heart sounds: Normal heart sounds.  Pulmonary:     Effort: Pulmonary effort is normal. No respiratory distress.     Breath sounds: Normal breath sounds. No wheezing or rales.  Abdominal:     Palpations: Abdomen is soft.  Musculoskeletal:        General: Normal range of motion.     Right lower leg: No edema.     Left lower leg: No edema.  Lymphadenopathy:     Cervical: No cervical adenopathy.  Skin:    General: Skin is warm.     Capillary Refill: Capillary refill takes less than 2 seconds.  Neurological:     General: No focal deficit present.     Mental Status: She is alert and oriented to person, place, and time.  Psychiatric:        Mood and Affect: Mood normal.     Labs reviewed: Basic Metabolic Panel: Recent Labs    04/18/22 1613 05/22/22 0928 07/17/22 1331 11/24/22 1040  NA  --  143 139 140  K  --  4.0 4.6 3.9  CL  --  104 102 103  CO2  --  30 31 27   GLUCOSE  --  103* 87 93  BUN  --  13 13 22   CREATININE  --  0.90 0.82 0.88  CALCIUM  --  9.2 9.0 9.2  TSH 1.07  --   --   --    Liver Function Tests: Recent Labs    05/22/22 0928 07/17/22 1331 11/24/22 1040  AST 20 22 18   ALT 14 16 10   BILITOT 0.4 0.5 0.3  PROT 6.7 6.3 6.9   No results for input(s): "LIPASE", "AMYLASE" in the last 8760 hours. No results for input(s): "AMMONIA" in the last 8760 hours. CBC: Recent Labs    05/22/22 0928 07/17/22 1331 11/24/22 1040  WBC 3.5* 4.4 5.7  NEUTROABS 1,383*  2,715 3,414  HGB 12.9 12.9 12.8  HCT 38.8 38.4 40.1  MCV 90.4 90.6 92.8  PLT 171 160 246   Lipid Panel: Recent Labs    05/22/22 0928  CHOL 168  HDL 89  LDLCALC 63  TRIG 77  CHOLHDL 1.9   TSH: Recent Labs    04/18/22 1613  TSH 1.07   A1C: Lab Results  Component Value Date   HGBA1C 5.8 (H) 11/24/2022     Assessment/Plan 1. Elevated blood pressure reading (Primary) - controlled, goal < 150/90 - BUN/creat 22/0.88 10/2022 - asymptomatic - adherent to low sodium diet - did not bring blood pressure readings - advised patient to bring list of last months readings> will decide if antihypertensive is needed after review  2. Post-nasal drip - ongoing - scheduled to see ENT 02/18 - cont Flonase and Xyzal - cont saline nasal spray prn  3. Tinnitus of both ears - ongoing - scheduled to see ENT 02/18  4. Need for Tdap vaccination - plans to get at local pharmacy   Total time: 31 minutes. Greater than 50% of total time spent doing patient education regarding health maintenance, blood pressure, post nasal drip and tinnitus including symptom/medication management.    Next appt: 05/07/2023  Hazle Nordmann, Juel Burrow  [facility] [phone]

## 2023-03-12 NOTE — Patient Instructions (Signed)
Get tetanus vaccine at local pharmacy (called Tdap)  Please drop off blood pressure list at office    Schedule routine visit in March 2025 with me

## 2023-03-25 ENCOUNTER — Other Ambulatory Visit: Payer: Self-pay

## 2023-03-25 DIAGNOSIS — F32A Depression, unspecified: Secondary | ICD-10-CM

## 2023-03-25 DIAGNOSIS — E785 Hyperlipidemia, unspecified: Secondary | ICD-10-CM

## 2023-03-25 NOTE — Telephone Encounter (Signed)
High Risk Warning Populated when attempting to refill, I will send to Provider for further review

## 2023-03-26 MED ORDER — HYDROXYZINE PAMOATE 25 MG PO CAPS: 25.0000 mg | ORAL_CAPSULE | Freq: Every day | ORAL | 0 refills | Status: DC | PRN

## 2023-03-26 MED ORDER — ATORVASTATIN CALCIUM 20 MG PO TABS: 20.0000 mg | ORAL_TABLET | Freq: Every day | ORAL | 3 refills | Status: DC

## 2023-03-26 MED ORDER — DULOXETINE HCL 60 MG PO CPEP: 60.0000 mg | ORAL_CAPSULE | Freq: Every day | ORAL | 1 refills | Status: DC

## 2023-03-31 DIAGNOSIS — R3121 Asymptomatic microscopic hematuria: Secondary | ICD-10-CM | POA: Diagnosis not present

## 2023-03-31 DIAGNOSIS — R351 Nocturia: Secondary | ICD-10-CM | POA: Diagnosis not present

## 2023-03-31 DIAGNOSIS — N3941 Urge incontinence: Secondary | ICD-10-CM | POA: Diagnosis not present

## 2023-04-10 ENCOUNTER — Telehealth: Payer: Self-pay | Admitting: Cardiology

## 2023-04-10 NOTE — Telephone Encounter (Signed)
Pt's daughter wants a cb to discuss pt's dizziness, fatigue/lightedness, HR issues. Pt was not with daughter at the time of call but has complained about symptoms to her. Has pending/yearly f/u in May. Just wants to know if she may ntbs sooner for eval.

## 2023-04-10 NOTE — Telephone Encounter (Signed)
Returned call to daughter [name] no answer.Left message to call back.

## 2023-04-13 ENCOUNTER — Telehealth (INDEPENDENT_AMBULATORY_CARE_PROVIDER_SITE_OTHER): Payer: Self-pay | Admitting: Otolaryngology

## 2023-04-13 NOTE — Telephone Encounter (Signed)
 Pt's daughter [name] returning call, requesting cb [phone]

## 2023-04-13 NOTE — Telephone Encounter (Signed)
 Reminder call: Date: 04/14/2023 Status: Sch  Time: 1:30 PM [address] [place], [place] [address]  Left voicemail w/time and location

## 2023-04-13 NOTE — Telephone Encounter (Signed)
 Spoke to patient's daughter [name].She stated mother has been light headed,dizzy and tired for the last couple of weeks.No chest pain. Stated she is going to join a gym. Advised to hold off on any exercise until after her appointment.Appointment scheduled with Reather Littler 2/20 at 2:45 pm.

## 2023-04-14 ENCOUNTER — Encounter (INDEPENDENT_AMBULATORY_CARE_PROVIDER_SITE_OTHER): Payer: Self-pay

## 2023-04-14 ENCOUNTER — Ambulatory Visit (INDEPENDENT_AMBULATORY_CARE_PROVIDER_SITE_OTHER): Payer: Medicare HMO | Admitting: Otolaryngology

## 2023-04-14 VITALS — Ht 61.0 in | Wt 120.0 lb

## 2023-04-14 DIAGNOSIS — H6123 Impacted cerumen, bilateral: Secondary | ICD-10-CM | POA: Diagnosis not present

## 2023-04-14 DIAGNOSIS — H9190 Unspecified hearing loss, unspecified ear: Secondary | ICD-10-CM | POA: Diagnosis not present

## 2023-04-14 DIAGNOSIS — R09A2 Foreign body sensation, throat: Secondary | ICD-10-CM | POA: Diagnosis not present

## 2023-04-14 DIAGNOSIS — R0981 Nasal congestion: Secondary | ICD-10-CM

## 2023-04-14 DIAGNOSIS — H938X3 Other specified disorders of ear, bilateral: Secondary | ICD-10-CM | POA: Diagnosis not present

## 2023-04-14 DIAGNOSIS — R0982 Postnasal drip: Secondary | ICD-10-CM | POA: Diagnosis not present

## 2023-04-14 MED ORDER — AZELASTINE HCL 0.1 % NA SOLN: 2.0000 | Freq: Two times a day (BID) | NASAL | 12 refills | Status: AC

## 2023-04-14 NOTE — Patient Instructions (Signed)
 Continue to use the zyrtec Use astelin nasal spray two sprays each nostril twice per day

## 2023-04-14 NOTE — Progress Notes (Signed)
 Dear Dr. Coletta Memos, Here is my assessment for our mutual patient, Paige Pena. Thank you for allowing me the opportunity to care for your patient. Please do not hesitate to contact me should you have any other questions. Sincerely, Dr. Jovita Kussmaul  Otolaryngology Clinic Note Referring provider: Dr. Coletta Memos HPI:  Paige Pena is a 83 y.o. female kindly referred by Dr. Coletta Memos for evaluation of multiple concerns.  Initial visit (03/2023): Ear complaints -- Patient reports: she reports that her ears feel full, and feels like she is in a tunnel. Does have bilateral tinnitus (non-pulsatile), comes and goes. Left ear hearing decline. She reports that she feels a bit lightheaded and unsteady - no room spinning. Ongoing for a couple of months, maybe worsening. She tried debrox, and did not help much. Ears generally not a problem, no frequent infections. Last hearing test a couple of years ago. Patient denies: ear pain, vertigo, drainage Patient additionally denies: deep pain in ear canal, eustachian tube symptoms such as popping/crackling, sensitive to pressure changes Patient also denies barotrauma, vestibular suppressant use, ototoxic medication use Prior ear surgery: no  Throat complaints -- Patient reports: sensation of lump in throat ongoing for few months; no antecedent event including stress or aspiration event or episode. No recent intubations. No GERD Patient otherwise denies: - dysphagia, odynophagia, aspiration episodes or PNA, need for Heimlich, unintentional weight loss - changes in voice, shortness of breath, hemoptysis, tobacco or significant alcohol history - ear pain, neck masses  Nasal complaints -- From nasal standpoint, she reports some bilateral mucoid rhinorrhea and post nasal drip for past few months, but this is a recurrent problem. No frequent sinus infections, but does report AR symptom - reports seasonal symptoms (typically in spring and sometimes fall) - sneezing, congestion.  Also reports some globus sensation. No recent abx/steroids. No frequent infections.  She has tried xyzal/zyrtec, and flonase. No nasal rinses. She is currently using zyrtec, but not using flonase. Occasional nasal saline use.   Allergy testing "years ago." No recent CT of head or sinus   H&N Surgery: no Personal or FHx of bleeding dz or anesthesia difficulty: no  AP/AC: ASA 325  Tobacco: no.  PMHx: Stroke and TIA, Neuropathy (on gabapentin), HTN, AR, GERD, Gastroparesis, Fibromyalgia,  Independent Review of Additional Tests or Records:  Paige Nordmann, NP (PCP): noted bilateral tinnitus, h/o cerumen impaction; also noted chronic PND, using xyzal and flonase without success; also on gabapentin for lightheadedness; also noted lump in throat - sometimes having dysphagia(?): Rx: ref ENT CBC and CMP 11/24/2022: wnl MRI Brain 07/18/2022 independently interpreted with respect to ears and sinuses: no significant sinonasal opacification; no retrocochlear lesions noted CT Head 07/14/2022: cuts thick but independent review shows no significant sinonasal opacification; mastoids, ME well aerated; cuts thick but ossicular chain and otic capsule unremarkable [facility] 08/22/2021: LPR, "mucus stuck" for a while; TFL reassuring; PPI, f/u 3 months with audio Dr. Janelle Floor ENT 11/28/2019: ear and throat concerns, PND, wax left ear - b/l cerumen impaction cleared; observation Audio 04/06/2017 [facility] (can't review report):    PMH/Meds/All/SocHx/FamHx/ROS:   Past Medical History:  Diagnosis Date   Acute cystitis    Anxiety    Aphasia    transsient aphasia Jan 2012   Asthmatic bronchitis    Cerebrovascular disease    Diverticulosis of colon    DJD (degenerative joint disease)    Dysphonia    Esophageal stricture    Fibromyalgia    Gastroparesis    GERD (  gastroesophageal reflux disease)    Hematuria, microscopic    Hemorrhoids    Hypercholesteremia    IBS (irritable bowel syndrome)     Memory deficits 02/15/2013   RLS (restless legs syndrome)    TIA (transient ischemic attack)    Urinary incontinence    Vitamin D deficiency      Past Surgical History:  Procedure Laterality Date   ABDOMINAL HYSTERECTOMY  02/24/1993   cataract surgery Right 12/19/2021   corneal transplant for keratonconus     EYE SURGERY  02/25/2011   left foot fracture with ORIF  07/25/2004   Dr. Lajoyce Corners   right bunion surgery      Family History  Problem Relation Age of Onset   Hypertension Mother    Anemia Mother    Dementia Mother    Kidney disease Father    Hyperlipidemia Sister    Diabetes Brother    Lung cancer Brother    Dementia Maternal Grandmother    Diabetes Other        aunt,brother,uncle   Breast cancer Maternal Aunt      Social Connections: Moderately Integrated (05/01/2022)   Social Connection and Isolation Panel [NHANES]    Frequency of Communication with Friends and Family: More than three times a week    Frequency of Social Gatherings with Friends and Family: Once a week    Attends Religious Services: More than 4 times per year    Active Member of [employer] or Organizations: No    Attends Banker Meetings: Never    Marital Status: Married      Current Outpatient Medications:    aspirin 325 MG EC tablet, Take 325 mg by mouth daily., Disp: , Rfl:    atorvastatin (LIPITOR) 20 MG tablet, Take 1 tablet (20 mg total) by mouth daily., Disp: 30 tablet, Rfl: 3   azelastine (ASTELIN) 0.1 % nasal spray, Place 2 sprays into both nostrils 2 (two) times daily. Use in each nostril as directed, Disp: 30 mL, Rfl: 12   Biotin 1000 MCG tablet, Take 1,000 mcg by mouth 3 (three) times daily., Disp: , Rfl:    Cholecalciferol (VITAMIN D) 50 MCG (2000 UT) CAPS, Take by mouth., Disp: , Rfl:    COLLAGEN-VITAMIN C PO, Take by mouth., Disp: , Rfl:    cyanocobalamin (VITAMIN B12) 1000 MCG tablet, Take 1 tablet (1,000 mcg total) by mouth daily., Disp: 30 tablet, Rfl: 3   DULoxetine  (CYMBALTA) 60 MG capsule, Take 1 capsule (60 mg total) by mouth daily., Disp: 90 capsule, Rfl: 1   fluticasone (FLONASE) 50 MCG/ACT nasal spray, Place 2 sprays into both nostrils daily as needed for allergies., Disp: 16 g, Rfl: 3   hydrOXYzine (VISTARIL) 25 MG capsule, Take 1 capsule (25 mg total) by mouth daily as needed. For panic attack or increased anxiety, Disp: 30 capsule, Rfl: 0   levocetirizine (XYZAL) 5 MG tablet, Take 5 mg by mouth every morning., Disp: , Rfl:    Omega-3 Fatty Acids (FISH OIL) 1000 MG CAPS, Take by mouth., Disp: , Rfl:    omeprazole (PRILOSEC) 40 MG capsule, TAKE 1 CAPSULE TWICE DAILY, Disp: 180 capsule, Rfl: 1   prednisoLONE acetate (PRED FORTE) 1 % ophthalmic suspension, Place 1 drop into both eyes as directed., Disp: , Rfl:    Physical Exam:   Ht 5\' 1"  (1.549 m)   Wt 120 lb (54.4 kg)   BMI 22.67 kg/m   Salient findings:  CN II-XII intact  Bilateral EAC quite narrow (  only able to fit peds speculum in) with cerumen impaction; after clearance, TM intact with well pneumatized middle ear spaces. Given history and complaints, ear microscopy was indicated and performed for evaluation with findings as below in physical exam section and in procedures Weber 512: mid Rinne 512: AC > BC b/l  Anterior rhinoscopy: Septum intact; bilateral inferior turbinates without significant hypertrophy; Nasal endoscopy was indicated to better evaluate the nose and paranasal sinuses, given the patient's history and exam findings, and is detailed below. No lesions of oral cavity/oropharynx No obviously palpable neck masses/lymphadenopathy/thyromegaly No respiratory distress or stridor; Given complaint regarding globus sensation, TFL was indicated to better evaluate the proximal airway, given the patient's history and exam findings, and is detailed below.   Seprately Identifiable Procedures:  Procedure: Bilateral ear microscopy and cerumen removal using microscope (CPT 69210) - Mod 25,  50 Pre-procedure diagnosis: Cerumen impaction Bilateral external ears Post-procedure diagnosis: same Indication: Bilateral cerumen impaction; given patient's otologic complaints and history as well as for improved and comprehensive examination of external ear and tympanic membrane, bilateral otologic examination using microscope was performed and impacted cerumen removed  Procedure: Patient was placed semi-recumbent. Both ear canals were examined using the microscope with findings above. Impacted cerumen removed on left and on right using suction and currette with improvement in EAC examination and patency. Narrow ear canals Patient tolerated the procedure well.  PROCEDURE: Bilateral Diagnostic Rigid Nasal Endoscopy Pre-procedure diagnosis: post nasal drip, concern for  Post-procedure diagnosis: same Indication: See pre-procedure diagnosis and physical exam above Complications: None apparent EBL: 0 mL Anesthesia: Lidocaine 4% and topical decongestant was topically sprayed in each nasal cavity  Description of Procedure:  Patient was identified. A rigid 30 degree endoscope was utilized to evaluate the sinonasal cavities, mucosa, sinus ostia and turbinates and septum.  Overall, signs of mucosal inflammation are not noted.  No mucopurulence, polyps, or masses noted.   Right Middle meatus: clear Right SE Recess: clera Left MM: clear Left SE Recess: clear Minimal post-nasal secretions No masses over ETD  Photodocumentation was obtained.  CPT CODE -- 60454 - Mod 25  Procedure Note Pre-procedure diagnosis: Globus sensation Post-procedure diagnosis: Same Procedure: Transnasal Fiberoptic Laryngoscopy, CPT 31575 - Mod 25 Indication: Globus sensation Complications: None apparent EBL: 0 mL  The procedure was undertaken to further evaluate the patient's complaint of globus sensation, with mirror exam inadequate for appropriate examination due to gag reflex and poor patient  tolerance  Procedure:  Patient was identified as correct patient. Verbal consent was obtained. The nose was sprayed with oxymetazoline and 4% lidocaine. The The flexible laryngoscope was passed through the nose to view the nasal cavity, pharynx (oropharynx, hypopharynx) and larynx.  The larynx was examined at rest and during multiple phonatory tasks. Documentation was obtained and reviewed with patient. The scope was removed. The patient tolerated the procedure well.  Findings: The nasal cavity and nasopharynx did not reveal any masses or lesions, mucosa appeared to be without obvious lesions. The tongue base, pharyngeal walls, piriform sinuses, vallecula, epiglottis and postcricoid region are normal in appearance without significant post-cricoid edema or pyriform secretions. The visualized portion of the subglottis and proximal trachea is widely patent. The vocal folds are mobile bilaterally. There are no lesions on the free edge of the vocal folds nor elsewhere in the larynx worrisome for malignancy.    Electronically signed by: Read Drivers, MD 04/14/2023 4:53 PM  Impression & Plans:  Paige Pena is a 83 y.o. female with  1. Subjective  hearing loss   2. Sensation of fullness in both ears   3. Bilateral impacted cerumen   4. Globus sensation   5. Post-nasal drip   6. Nasal congestion    Multiple issues with ears, nose and throat. HL and fullness: seems to be ETD v/s just cerumen, with improvement after impacted cerumen cleared; will do mineral oil or baby oil drops once per week; will schedule audiogram - prior showed SNHL PND/Congestion: exam reassuring, no CRS symptoms; tried flonase and PO antihistamine with some benefit; endo reassuring without purulence - Given improvement with flonase/anthistamine, will add Astelin BID and see if it helps her further 3.  Globus sensation - in back of throat; TFL reassuring; we discussed etiologies including PND and LPR or referred sensation; offered  MBS but declined; will observe  - f/u PRN pending audio; certainly if things get worse  See below regarding exact medications prescribed this encounter including dosages and route: Meds ordered this encounter  Medications   azelastine (ASTELIN) 0.1 % nasal spray    Sig: Place 2 sprays into both nostrils 2 (two) times daily. Use in each nostril as directed    Dispense:  30 mL    Refill:  12      Thank you for allowing me the opportunity to care for your patient. Please do not hesitate to contact me should you have any other questions.  Sincerely, Jovita Kussmaul, MD Otolaryngologist (ENT), [facility] Phone: [phone] Fax: [phone]  04/14/2023, 4:53 PM   MDM:  Level 4 Complexity/Problems addressed: mod - multiple chronic problems Data complexity: high - independent review of notes, labs; independent interpretation of imaging; ordering test - Morbidity: mod  - Prescription Drug prescribed or managed: yes

## 2023-04-15 NOTE — Progress Notes (Deleted)
   Cardiology Office Note    Date:  04/15/2023  ID:  Paige Pena, DOB Jul 30, 1940, MRN [id number] PCP:  Paige Heir, NP  Cardiologist:  Rollene Rotunda, MD  Electrophysiologist:  None   Chief Complaint: ***  History of Present Illness: .    Paige Pena is a 83 y.o. female with visit-pertinent history of palpitation, TIA/stroke.  First evaluated by Dr. Antoine Poche on 01/03/2022 for palpitations.  It was noted that she had history of TIA/stroke, echo in 2017 as part of a neurology workup demonstrated no significant abnormalities.  It was noted that she had significant history of eye problems and had at that time recently undergone right eye corneal transplant.  Patient reported that she had been having palpitations for about a month that would happen in the mid chest.  She would describe it as a skipping or fluttering or racing that she was unable to quantify or qualify.  Noted occurred more at bedtime.  30-day cardiac monitor indicated predominant rhythm was normal sinus rhythm.  Patient triggered events were reported during sinus/sinus tachycardia.  There were 2 runs of NSVT with 6 beats, no sustained arrhythmias.  It was noted that patient had a possible brief run of atrial flutter and brief runs of NSVT.  Anticoagulation was not recommended at that time.  She was last seen in clinic on 07/17/2022, she remained stable from a cardiac standpoint.  She reported fewer palpitations.  She denied presyncope or syncope.  She had unfortunately fallen out of her bed a few days prior and hit her head.  On 04/13/2023 patient's daughter notified the office that she had been lightheaded, dizzy and tired for the last couple of weeks.  She denied any chest pain.  Today she presents regarding   Palpitations:  Hypertension: Blood pressure today  Continue   Labwork independently reviewed:   ROS: .    Please see the history of present illness. Otherwise, review of systems is positive for ***.  All other  systems are reviewed and otherwise negative.  Studies Reviewed: Marland Kitchen    EKG:  EKG is ordered today, personally reviewed, demonstrating ***  CV Studies: Cardiac studies reviewed are outlined and summarized above. Otherwise please see EMR for full report.   Current Reported Medications:.    No outpatient medications have been marked as taking for the 04/16/23 encounter (Appointment) with Rip Harbour, NP.    Physical Exam:    VS:  There were no vitals taken for this visit.   Wt Readings from Last 3 Encounters:  04/14/23 120 lb (54.4 kg)  03/12/23 116 lb (52.6 kg)  02/05/23 115 lb 6.4 oz (52.3 kg)    GEN: Well nourished, well developed in no acute distress NECK: No JVD; No carotid bruits CARDIAC: ***RRR, no murmurs, rubs, gallops RESPIRATORY:  Clear to auscultation without rales, wheezing or rhonchi  ABDOMEN: Soft, non-tender, non-distended EXTREMITIES:  No edema; No acute deformity   Asessement and Plan:.     ***     Disposition: F/u with ***  Signed, Rip Harbour, NP

## 2023-04-16 ENCOUNTER — Ambulatory Visit: Payer: Medicare HMO | Admitting: Cardiology

## 2023-04-16 DIAGNOSIS — R002 Palpitations: Secondary | ICD-10-CM

## 2023-04-16 DIAGNOSIS — I10 Essential (primary) hypertension: Secondary | ICD-10-CM

## 2023-04-22 ENCOUNTER — Telehealth (INDEPENDENT_AMBULATORY_CARE_PROVIDER_SITE_OTHER): Payer: Self-pay | Admitting: Audiology

## 2023-04-22 NOTE — Telephone Encounter (Signed)
 Left vm to confirm appt and address for 04/23/2023.

## 2023-04-22 NOTE — Progress Notes (Unsigned)
 Cardiology Office Note    Date:  04/24/2023  ID:  Paige Pena, DOB 07-06-1940, MRN [id number] PCP:  Paige Heir, NP  Cardiologist:  Paige Rotunda, MD  Electrophysiologist:  None   Chief Complaint: Palpitations, fatigue   History of Present Illness: .    Paige Pena is a 83 y.o. female with visit-pertinent history of palpitations, TIA/stroke.  First evaluated by Dr. Antoine Pena on 01/03/2022 for palpitations.  It was noted that she had history of TIA/stroke, echo in 2017 as part of a neurology workup demonstrated no significant abnormalities.  It was noted that she had significant history of eye problems and had at that time recently undergone right eye corneal transplant.  Patient reported that she had been having palpitations for about a month that would happen in the mid chest.  She would describe it as a skipping or fluttering or racing that she was unable to quantify or qualify.  Noted occurred more at bedtime.  30-day cardiac monitor indicated predominant rhythm was normal sinus rhythm.  Patient triggered events were reported during sinus/sinus tachycardia.  There were 2 runs of NSVT with 6 beats, no sustained arrhythmias.  It was noted that patient had a possible brief run of atrial flutter and brief runs of NSVT.  Anticoagulation was not recommended at that time.  She was last seen in clinic on 07/17/2022, she remained stable from a cardiac standpoint.  She reported fewer palpitations.  She denied presyncope or syncope.  She had unfortunately fallen out of her bed a few days prior and hit her head.  On 04/13/2023 patient's daughter notified the office that she had been lightheaded, dizzy and tired for the last couple of weeks.  She denied any chest pain.  Today she presents regarding episodes of palpitations, dizziness, lightheadedness and fatigue.  Patient's daughter listened in on visit via her phone as she lives in [place].  Patient daughter report episodes of palpitations  described as fast heartbeats lasting 3 to 4 minutes that usually occur at night when she is getting for ride or going to bed.  She has some slight shortness of breath with this, denies any other associated symptoms.  Patient also reports intermittent episodes of dizziness and lightheadedness, she denies any palpitations when these occur.  She denies any dizziness or lightheadedness with position changes.  Her daughter also notes that she regularly comments on just overall feeling tired all the time.  Patient denies chest pain, lower extremity edema, orthopnea, PND, presyncope or syncope.  She notes that she typically eats 1-1/2 meals a day and will occasionally eat some snacks.  She reports that she does start to stay well-hydrated.  ROS: .   Today she denies chest pain, lower extremity edema, melena, hematuria, hemoptysis, diaphoresis, weakness, presyncope, syncope, orthopnea, and PND.  All other systems are reviewed and otherwise negative. Studies Reviewed: Marland Kitchen    EKG:  EKG is ordered today, personally reviewed, demonstrating  EKG Interpretation Date/Time:  Friday April 24 2023 14:10:30 EST Ventricular Rate:  85 PR Interval:  144 QRS Duration:  78 QT Interval:  346 QTC Calculation: 411 R Axis:   40  Text Interpretation: Normal sinus rhythm Normal ECG Confirmed by Reather Littler [phone]) on 04/24/2023 2:14:19 PM   CV Studies:  Cardiac Studies & Procedures   ______________________________________________________________________________________________     ECHOCARDIOGRAM  ECHOCARDIOGRAM COMPLETE 03/22/2015  Narrative ** *[facility]* [address] [place], [place] [address] [phone]  ------------------------------------------------------------------- Transthoracic Echocardiography  Patient:  Paige, Pena MR #:       [id number] Study Date: 03/22/2015 Gender:     F Age:        74 Height:     149.9 cm Weight:     59.9 kg BSA:        1.6 m^2 Pt.  Status: Room:       5M12C  PERFORMING   Paige Hacking, MD [facility]    Paige Pena [id number] SONOGRAPHER  [address]    Lorretta Harp [id number]     [place], YQIHK [id number] Paige Pena, [place] [id number]  cc:  ------------------------------------------------------------------- LV EF: 60% -   65%  ------------------------------------------------------------------- Indications:      TIA 435.9.  ------------------------------------------------------------------- History:   PMH:  GERD, Palpitations.  Angina pectoris.  ------------------------------------------------------------------- Study Conclusions  - Left ventricle: The cavity size was normal. Wall thickness was increased in a pattern of mild LVH. Systolic function was normal. The estimated ejection fraction was in the range of 60% to 65%. Wall motion was normal; there were no regional wall motion abnormalities. Doppler parameters are consistent with abnormal left ventricular relaxation (grade 1 diastolic dysfunction). - Mitral valve: There was mild regurgitation. - Left atrium: The atrium was mildly dilated.  Transthoracic echocardiography.  M-mode, complete 2D, spectral Doppler, and color Doppler.  Birthdate:  Patient birthdate: 19-May-1940.  Age:  Patient is 83 yr old.  Sex:  Gender: female. BMI: 26.7 kg/m^2.  Blood pressure:     117/66  Patient status: Inpatient.  Study date:  Study date: 03/22/2015. Study time: 12:13 PM.  Location:  Bedside.  -------------------------------------------------------------------  ------------------------------------------------------------------- Left ventricle:  The cavity size was normal. Wall thickness was increased in a pattern of mild LVH. Systolic function was normal. The estimated ejection fraction was in the range of 60% to 65%. Wall motion was normal; there were no regional wall motion abnormalities. Doppler parameters are consistent with abnormal  left ventricular relaxation (grade 1 diastolic dysfunction).  ------------------------------------------------------------------- Aortic valve:   Trileaflet; normal thickness leaflets. Mobility was not restricted.  Doppler:  Transvalvular velocity was within the normal range. There was no stenosis. There was no regurgitation.  ------------------------------------------------------------------- Aorta:  Aortic root: The aortic root was normal in size.  ------------------------------------------------------------------- Mitral valve:   Structurally normal valve.   Mobility was not restricted.  Doppler:  Transvalvular velocity was within the normal range. There was no evidence for stenosis. There was mild regurgitation.    Peak gradient (D): 3 mm Hg.  ------------------------------------------------------------------- Left atrium:  The atrium was mildly dilated.  ------------------------------------------------------------------- Right ventricle:  The cavity size was normal. Wall thickness was normal. Systolic function was normal.  ------------------------------------------------------------------- Pulmonic valve:    The valve appears to be grossly normal. Doppler:  Transvalvular velocity was within the normal range. There was no evidence for stenosis.  ------------------------------------------------------------------- Tricuspid valve:   Structurally normal valve.    Doppler: Transvalvular velocity was within the normal range. There was mild regurgitation.  ------------------------------------------------------------------- Pulmonary artery:   The main pulmonary artery was normal-sized. Systolic pressure was within the normal range.  ------------------------------------------------------------------- Right atrium:  The atrium was normal in size.  ------------------------------------------------------------------- Pericardium:  There was no pericardial  effusion.  ------------------------------------------------------------------- Systemic veins: Inferior vena cava: The vessel was normal in size.  ------------------------------------------------------------------- Measurements  Left ventricle                         Value  Reference LV ID, ED, PLAX chordal        (L)     30.2  mm     43 - 52 LV ID, ES, PLAX chordal                24    mm     23 - 38 LV fx shortening, PLAX chordal (L)     21    %      >=29 LV PW thickness, ED                    12.8  mm     --------- IVS/LV PW ratio, ED                    1.05         <=1.3 LV e&', lateral                         8.55  cm/s   --------- LV E/e&', lateral                       9.93         --------- LV e&', medial                          7.68  cm/s   --------- LV E/e&', medial                        11.05        --------- LV e&', average                         8.12  cm/s   --------- LV E/e&', average                       10.46        ---------  Ventricular septum                     Value        Reference IVS thickness, ED                      13.5  mm     ---------  Aorta                                  Value        Reference Aortic root ID, ED                     27    mm     ---------  Left atrium                            Value        Reference LA ID, A-P, ES                         26    mm     --------- LA ID/bsa, A-P                         1.63  cm/m^2 <=2.2 LA  volume, ES, 1-p A4C                 21    ml     --------- LA volume/bsa, ES, 1-p A4C             13.2  ml/m^2 --------- LA volume, ES, 1-p A2C                 41    ml     --------- LA volume/bsa, ES, 1-p A2C             25.7  ml/m^2 ---------  Mitral valve                           Value        Reference Mitral E-wave peak velocity            84.9  cm/s   --------- Mitral A-wave peak velocity            110   cm/s   --------- Mitral deceleration time       (H)     232   ms     150 - 230 Mitral peak  gradient, D                3     mm Hg  --------- Mitral E/A ratio, peak                 0.8          ---------  Pulmonary arteries                     Value        Reference PA pressure, S, DP                     26    mm Hg  <=30  Tricuspid valve                        Value        Reference Tricuspid regurg peak velocity         242   cm/s   --------- Tricuspid peak RV-RA gradient          23    mm Hg  ---------  Systemic veins                         Value        Reference Estimated CVP                          3     mm Hg  ---------  Right ventricle                        Value        Reference RV pressure, S, DP                     26    mm Hg  <=30  Legend: (L)  and  (H)  mark values outside specified reference range.  ------------------------------------------------------------------- Prepared and Electronically Authenticated by  Paige Hacking, MD [facility] 2017-01-26T17:32:29    MONITORS  CARDIAC EVENT MONITOR 05/16/2022  Narrative Predominant rhythm was normal sinus Flutter or skipped beats symptoms reported during sinus/sinus tach  Two beat runs of NSVT with 6 beats longest run No sustained arrhythmias       ______________________________________________________________________________________________      Current Reported Medications:.    Current Meds  Medication Sig   aspirin 325 MG EC tablet Take 325 mg by mouth daily.   atorvastatin (LIPITOR) 20 MG tablet Take 1 tablet (20 mg total) by mouth daily.   azelastine (ASTELIN) 0.1 % nasal spray Place 2 sprays into both nostrils 2 (two) times daily. Use in each nostril as directed   Biotin 1000 MCG tablet Take 1,000 mcg by mouth 3 (three) times daily.   Cholecalciferol (VITAMIN D) 50 MCG (2000 UT) CAPS Take by mouth.   COLLAGEN-VITAMIN C PO Take by mouth.   cyanocobalamin (VITAMIN B12) 1000 MCG tablet Take 1 tablet (1,000 mcg total) by mouth daily.   DULoxetine (CYMBALTA) 60 MG capsule Take 1 capsule (60 mg  total) by mouth daily.   fluticasone (FLONASE) 50 MCG/ACT nasal spray Place 2 sprays into both nostrils daily as needed for allergies.   hydrOXYzine (VISTARIL) 25 MG capsule Take 1 capsule (25 mg total) by mouth daily as needed. For panic attack or increased anxiety   levocetirizine (XYZAL) 5 MG tablet Take 5 mg by mouth every morning.   Omega-3 Fatty Acids (FISH OIL) 1000 MG CAPS Take by mouth.   omeprazole (PRILOSEC) 40 MG capsule TAKE 1 CAPSULE TWICE DAILY   prednisoLONE acetate (PRED FORTE) 1 % ophthalmic suspension Place 1 drop into both eyes as directed.    Physical Exam:    VS:  BP 116/60 (BP Location: Left Arm, Patient Position: Sitting, Cuff Size: Normal)   Pulse 85   Ht 5\' 1"  (1.549 m)   Wt 116 lb (52.6 kg)   SpO2 95%   BMI 21.92 kg/m    Wt Readings from Last 3 Encounters:  04/24/23 116 lb (52.6 kg)  04/14/23 120 lb (54.4 kg)  03/12/23 116 lb (52.6 kg)    GEN: Well nourished, well developed in no acute distress NECK: No JVD; No carotid bruits CARDIAC: RRR, no murmurs, rubs, gallops RESPIRATORY:  Clear to auscultation without rales, wheezing or rhonchi  ABDOMEN: Soft, non-tender, non-distended EXTREMITIES:  No edema; No acute deformity   Asessement and Plan:.    Palpitations/lightheadedness/dizziness/shortness of breath: Patient reports that she has been having palpitations a few times a week at night typically when getting ready for bed or when going to bed, lasting for 3 to 4 minutes described as a fast heart rate.  She notes some slight shortness of breath with palpitations.  Encouraged patient to stay well-hydrated and decrease her caffeine intake, notes she does not drink much caffeine.  She also does not drink alcohol.  Low suspicion for sleep apnea.  Patient also notes intermittent episodes of feeling dizzy or lightheaded, she has had recent episodes of tinnitus but she does not feel that this is related to vertigo.  She denies any palpitations when feeling lightheaded  or dizzy, she does not feel this is related to dehydration.  She denies feeling dizzy or lightheaded with position changes.  Will have patient wear 2-week ZIO monitor to assess for arrhythmia.  Check echocardiogram.  Check CBC, BMET, TSH and magnesium.  Fatigue: Patient reports a vague generalized sense of fatigue in recent weeks.  She denies any chest pain or shortness of breath outside of some palpitations.  She denies nighttime snoring, low suspicion for sleep apnea.  Patient and daughter notes she does not eat regular meals  throughout the day and relies on protein drinks and bars, encouraged well-rounded diet with small frequent meals throughout the day.  Will have patient wear cardiac monitor as noted above and check echocardiogram.  Encouraged patient to increase her activity as tolerated.  Check CBC, BMET, TSH and magnesium.  Hypertension: Blood pressure today 116/60.  Patient notes some slight blood pressure elevations earlier this year, she has not required any antihypertensive medications.   Disposition: F/u with Reather Littler, NP in 6-8 weeks.   Signed, Rip Harbour, NP

## 2023-04-23 ENCOUNTER — Ambulatory Visit (INDEPENDENT_AMBULATORY_CARE_PROVIDER_SITE_OTHER): Payer: Medicare HMO | Admitting: Audiology

## 2023-04-23 DIAGNOSIS — H903 Sensorineural hearing loss, bilateral: Secondary | ICD-10-CM | POA: Diagnosis not present

## 2023-04-23 NOTE — Progress Notes (Signed)
  [address] [place], [place] [address] [phone]  Audiological Evaluation    Name: Paige Pena     DOB:   Apr 11, 1940      MRN:   [id number]                                                                                     Service Date: 04/23/2023     Accompanied by: unaccompanied to the booth   Patient comes today after Dr. Allena Katz, ENT sent a referral for a hearing evaluation due to concerns with hearing loss.   Symptoms Yes Details  Hearing loss  [x]  As per her family  Tinnitus  [x]  Sometimes, likely in both ears - not today  Ear pain/ infections/pressure  []  sometimes  Balance problems  []    Noise exposure history  []    Previous ear surgeries  []    Family history of hearing loss  [x]  Mother with age  Amplification  []    Other  []      Otoscopy: Right ear: Clear external ear canals and notable landmarks visualized on the tympanic membrane. Left ear:  Clear external ear canals and notable landmarks visualized on the tympanic membrane.  Tympanometry: Right ear: Type A- Normal external ear canal volume with normal middle ear pressure and tympanic membrane compliance. Left ear: Type A- Normal external ear canal volume with normal middle ear pressure and tympanic membrane compliance.    Pure tone Audiometry:  Normal to moderate sensorineural hearing loss from 250 Hz - 8000 Hz, in both ears.    Speech Audiometry: Right ear- Speech Reception Threshold (SRT) was obtained at 15 dBHL. Left ear-Speech Reception Threshold (SRT) was obtained at 15 dBHL.   Word Recognition Score Tested using NU-6 (MLV) Right ear: 88% was obtained at a presentation level of 75 dBHL with contralateral masking which is deemed as  good . Left ear: 92% was obtained at a presentation level of 75 dBHL with contralateral masking which is deemed as  excellent.   The hearing test results were completed under headphones and results are deemed to be of good to fair reliability. Test  technique:  conventional      Recommendations: Return for a hearing evaluation in at least 2 years, before if concerns with hearing  arise or per MD recommendation. Consider a communication needs assessment after medical clearance for hearing aids is obtained and pending patient motivation.   Crespin Forstrom MARIE LEROUX-MARTINEZ, AUD

## 2023-04-24 ENCOUNTER — Ambulatory Visit (INDEPENDENT_AMBULATORY_CARE_PROVIDER_SITE_OTHER): Payer: Medicare HMO

## 2023-04-24 ENCOUNTER — Ambulatory Visit: Payer: Medicare HMO | Attending: Cardiology | Admitting: Cardiology

## 2023-04-24 ENCOUNTER — Encounter: Payer: Self-pay | Admitting: Cardiology

## 2023-04-24 VITALS — BP 116/60 | HR 85 | Ht 61.0 in | Wt 116.0 lb

## 2023-04-24 DIAGNOSIS — I10 Essential (primary) hypertension: Secondary | ICD-10-CM | POA: Diagnosis not present

## 2023-04-24 DIAGNOSIS — R002 Palpitations: Secondary | ICD-10-CM

## 2023-04-24 DIAGNOSIS — R5383 Other fatigue: Secondary | ICD-10-CM

## 2023-04-24 DIAGNOSIS — R42 Dizziness and giddiness: Secondary | ICD-10-CM

## 2023-04-24 DIAGNOSIS — R0602 Shortness of breath: Secondary | ICD-10-CM | POA: Diagnosis not present

## 2023-04-24 NOTE — Progress Notes (Unsigned)
Enrolled patient for a 14 day Zio XT monitor to be mailed to patients home  [place] to read

## 2023-04-24 NOTE — Patient Instructions (Signed)
 Medication Instructions:  No changes *If you need a refill on your cardiac medications before your next appointment, please call your pharmacy*  Lab Work: Today we are going to draw a CBC, Bmet, TSH, Mag If you have labs (blood work) drawn today and your tests are completely normal, you will receive your results only by: MyChart Message (if you have MyChart) OR A paper copy in the mail If you have any lab test that is abnormal or we need to change your treatment, we will call you to review the results.  Testing/Procedures: Your physician has requested that you have an echocardiogram. Echocardiography is a painless test that uses sound waves to create images of your heart. It provides your doctor with information about the size and shape of your heart and how well your heart's chambers and valves are working. This procedure takes approximately one hour. There are no restrictions for this procedure. Please do NOT wear cologne, perfume, aftershave, or lotions (deodorant is allowed). Please arrive 15 minutes prior to your appointment time.  Please note: We ask at that you not bring children with you during ultrasound (echo/ vascular) testing. Due to room size and safety concerns, children are not allowed in the ultrasound rooms during exams. Our front office staff cannot provide observation of children in our lobby area while testing is being conducted. An adult accompanying a patient to their appointment will only be allowed in the ultrasound room at the discretion of the ultrasound technician under special circumstances. We apologize for any inconvenience.  ZIO XT- Long Term Monitor Instructions  Your physician has requested you wear a ZIO patch monitor for 14 days.  This is a single patch monitor. [employer] supplies one patch monitor per enrollment. Additional stickers are not available. Please do not apply patch if you will be having a Nuclear Stress Test,  Echocardiogram, Cardiac CT, MRI, or  Chest Xray during the period you would be wearing the  monitor. The patch cannot be worn during these tests. You cannot remove and re-apply the  ZIO XT patch monitor.  Your ZIO patch monitor will be mailed 3 day USPS to your address on file. It may take 3-5 days  to receive your monitor after you have been enrolled.  Once you have received your monitor, please review the enclosed instructions. Your monitor  has already been registered assigning a specific monitor serial # to you.  Billing and Patient Assistance Program Information  We have supplied [employer] with any of your insurance information on file for billing purposes. [employer] offers a sliding scale Patient Assistance Program for patients that do not have  insurance, or whose insurance does not completely cover the cost of the ZIO monitor.  You must apply for the Patient Assistance Program to qualify for this discounted rate.  To apply, please call [employer] at [phone], select option 4, select option 2, ask to apply for  Patient Assistance Program. Meredeth Ide will ask your household income, and how many people  are in your household. They will quote your out-of-pocket cost based on that information.  [employer] will also be able to set up a 28-month, interest-free payment plan if needed.  Applying the monitor   Shave hair from upper left chest.  Hold abrader disc by orange tab. Rub abrader in 40 strokes over the upper left chest as  indicated in your monitor instructions.  Clean area with 4 enclosed alcohol pads. Let dry.  Apply patch as indicated in monitor instructions. Patch will be placed  under collarbone on left  side of chest with arrow pointing upward.  Rub patch adhesive wings for 2 minutes. Remove white label marked "1". Remove the white  label marked "2". Rub patch adhesive wings for 2 additional minutes.  While looking in a mirror, press and release button in center of patch. A small green light will  flash 3-4 times. This  will be your only indicator that the monitor has been turned on.  Do not shower for the first 24 hours. You may shower after the first 24 hours.  Press the button if you feel a symptom. You will hear a small click. Record Date, Time and  Symptom in the Patient Logbook.  When you are ready to remove the patch, follow instructions on the last 2 pages of Patient  Logbook. Stick patch monitor onto the last page of Patient Logbook.  Place Patient Logbook in the blue and white box. Use locking tab on box and tape box closed  securely. The blue and white box has prepaid postage on it. Please place it in the mailbox as  soon as possible. Your physician should have your test results approximately 7 days after the  monitor has been mailed back to [facility].  Call [facility] Customer Care at [phone] if you have questions regarding  your ZIO XT patch monitor. Call them immediately if you see an orange light blinking on your  monitor.  If your monitor falls off in less than 4 days, contact our Monitor department at [phone].  If your monitor becomes loose or falls off after 4 days call [employer] at [phone] for  suggestions on securing your monitor  Follow-Up: At [facility], you and your health needs are our priority.  As part of our continuing mission to provide you with exceptional heart care, we have created designated Provider Care Teams.  These Care Teams include your primary Cardiologist (physician) and Advanced Practice Providers (APPs -  Physician Assistants and Nurse Practitioners) who all work together to provide you with the care you need, when you need it.  We recommend signing up for the patient portal called "MyChart".  Sign up information is provided on this After Visit Summary.  MyChart is used to connect with patients for Virtual Visits (Telemedicine).  Patients are able to view lab/test results, encounter notes, upcoming appointments, etc.  Non-urgent  messages can be sent to your provider as well.   To learn more about what you can do with MyChart, go to [email or web address].    Your next appointment:   6-8 week(s)  Provider:   Reather Littler, NP

## 2023-04-25 LAB — BASIC METABOLIC PANEL
BUN/Creatinine Ratio: 28 (ref 12–28)
BUN: 22 mg/dL (ref 8–27)
CO2: 27 mmol/L (ref 20–29)
Calcium: 9.3 mg/dL (ref 8.7–10.3)
Chloride: 104 mmol/L (ref 96–106)
Creatinine, Ser: 0.78 mg/dL (ref 0.57–1.00)
Glucose: 84 mg/dL (ref 70–99)
Potassium: 3.9 mmol/L (ref 3.5–5.2)
Sodium: 143 mmol/L (ref 134–144)
eGFR: 75 mL/min/{1.73_m2} (ref >59)

## 2023-04-25 LAB — CBC
Hematocrit: 36.3 % (ref 34.0–46.6)
Hemoglobin: 12.1 g/dL (ref 11.1–15.9)
MCH: 30 pg (ref 26.6–33.0)
MCHC: 33.3 g/dL (ref 31.5–35.7)
MCV: 90 fL (ref 79–97)
Platelets: 165 10*3/uL (ref 150–450)
RBC: 4.03 x10E6/uL (ref 3.77–5.28)
RDW: 12.7 % (ref 11.7–15.4)
WBC: 10.1 10*3/uL (ref 3.4–10.8)

## 2023-04-25 LAB — TSH: TSH: 1.36 u[IU]/mL (ref 0.450–4.500)

## 2023-04-25 LAB — MAGNESIUM: Magnesium: 1.7 mg/dL (ref 1.6–2.3)

## 2023-04-28 ENCOUNTER — Telehealth: Payer: Self-pay | Admitting: Cardiology

## 2023-04-28 NOTE — Telephone Encounter (Signed)
  Rip Harbour, NP 04/26/2023 10:26 AM EST    Please let Ms. Conger know that her CBC shows no evidence of anemia or infection, her kidney function is normal and her electrolytes are normal. Her thyroid function is normal, overall good results! Continue current medications and continue with plan discussed at appointment.

## 2023-04-28 NOTE — Telephone Encounter (Signed)
 Spoke to patient.  Result given to patient about recent labs  Patient wanted to know if she should come to the office or follow instruction to place heart monitor    RN  also informed patient  she should be able to place monitor on to herself, instruction are easy to follow. She will wear the monitor for 14 days and return to the company.   She verbalized understanding.

## 2023-04-28 NOTE — Telephone Encounter (Signed)
 patient states she received heart monitor yesterday and she would like to discuss. she would also like to review lab results.

## 2023-04-30 ENCOUNTER — Telehealth: Payer: Self-pay | Admitting: Cardiology

## 2023-04-30 NOTE — Telephone Encounter (Signed)
 Patient identification verified by 2 forms. Shade Flood, RN     Called and spoke to patient. Patient made aware of the Magnesium, TSH, CBC, and BMET lab results. Verbalized understanding. No questions or concerns expressed at this time.

## 2023-04-30 NOTE — Telephone Encounter (Signed)
 Patient wants a call back to discuss lab test results.

## 2023-05-06 ENCOUNTER — Telehealth: Payer: Self-pay | Admitting: Cardiology

## 2023-05-06 NOTE — Telephone Encounter (Signed)
 Pt is requesting a callback regarding her wanting to know if she could get help putting a heart monitor on in the office. Please advise

## 2023-05-06 NOTE — Telephone Encounter (Signed)
 Patient identification verified by 2 forms. Shade Flood, RN     Called and spoke to patient  Patient states:  - needs assistance with putting zio monitor on               Interventions/Plan: - patient scheduled for nurse visit tomorrow 05/07/23 to place monitor     Patient agrees with plan, no questions at this time

## 2023-05-07 ENCOUNTER — Ambulatory Visit

## 2023-05-07 ENCOUNTER — Ambulatory Visit (INDEPENDENT_AMBULATORY_CARE_PROVIDER_SITE_OTHER): Payer: Medicare HMO | Admitting: Orthopedic Surgery

## 2023-05-07 ENCOUNTER — Encounter: Payer: Self-pay | Admitting: Orthopedic Surgery

## 2023-05-07 VITALS — BP 116/68 | HR 89 | Temp 97.6°F | Resp 18 | Ht 61.0 in | Wt 111.4 lb

## 2023-05-07 DIAGNOSIS — Z0000 Encounter for general adult medical examination without abnormal findings: Secondary | ICD-10-CM

## 2023-05-07 NOTE — Progress Notes (Signed)
 Subjective:   Paige Pena is a 83 y.o. female who presents for Medicare Annual (Subsequent) preventive examination.  Visit Complete: In person  Patient Medicare AWV questionnaire was completed by the patient on 05/07/2023; I have confirmed that all information answered by patient is correct and no changes since this date.  Cardiac Risk Factors include: hypertension;advanced age (>48men, >92 women);sedentary lifestyle;dyslipidemia     Objective:    Today's Vitals   05/07/23 1315 05/07/23 1322  BP: 116/68   Pulse: 89   Resp: 18   Temp: 97.6 F (36.4 C)   SpO2: 94%   Weight: 111 lb 6.4 oz (50.5 kg)   Height: 5\' 1"  (1.549 m)   PainSc:  0-No pain   Body mass index is 21.05 kg/m.     05/07/2023    1:08 PM 03/12/2023    1:12 PM 02/05/2023    3:03 PM 12/11/2022    2:37 PM 11/27/2022    1:18 PM 11/19/2022    3:49 PM 07/17/2022   12:49 PM  Advanced Directives  Does Patient Have a Medical Advance Directive? No No No No No No No  Would patient like information on creating a medical advance directive? No - Patient declined No - Patient declined No - Patient declined No - Patient declined No - Patient declined No - Patient declined No - Patient declined    Current Medications (verified) Outpatient Encounter Medications as of 05/07/2023  Medication Sig   aspirin 325 MG EC tablet Take 325 mg by mouth daily.   atorvastatin (LIPITOR) 20 MG tablet Take 1 tablet (20 mg total) by mouth daily.   azelastine (ASTELIN) 0.1 % nasal spray Place 2 sprays into both nostrils 2 (two) times daily. Use in each nostril as directed   Cholecalciferol (VITAMIN D) 50 MCG (2000 UT) CAPS Take by mouth.   COLLAGEN-VITAMIN C PO Take by mouth.   cyanocobalamin (VITAMIN B12) 1000 MCG tablet Take 1 tablet (1,000 mcg total) by mouth daily.   DULoxetine (CYMBALTA) 60 MG capsule Take 1 capsule (60 mg total) by mouth daily.   fluticasone (FLONASE) 50 MCG/ACT nasal spray Place 2 sprays into both nostrils daily as  needed for allergies.   hydrOXYzine (VISTARIL) 25 MG capsule Take 1 capsule (25 mg total) by mouth daily as needed. For panic attack or increased anxiety   levocetirizine (XYZAL) 5 MG tablet Take 5 mg by mouth every morning.   Omega-3 Fatty Acids (FISH OIL) 1000 MG CAPS Take by mouth.   omeprazole (PRILOSEC) 40 MG capsule TAKE 1 CAPSULE TWICE DAILY   prednisoLONE acetate (PRED FORTE) 1 % ophthalmic suspension Place 1 drop into both eyes as directed.   Biotin 1000 MCG tablet Take 1,000 mcg by mouth 3 (three) times daily. (Patient not taking: Reported on 05/07/2023)   No facility-administered encounter medications on file as of 05/07/2023.    Allergies (verified) Morphine, Metoclopramide hcl, Aricept [donepezil hcl], Doxycycline, Penicillins, Prednisone, and Sulfonamide derivatives   History: Past Medical History:  Diagnosis Date   Acute cystitis    Anxiety    Aphasia    transsient aphasia Jan 2012   Asthmatic bronchitis    Cerebrovascular disease    Diverticulosis of colon    DJD (degenerative joint disease)    Dysphonia    Esophageal stricture    Fibromyalgia    Gastroparesis    GERD (gastroesophageal reflux disease)    Hematuria, microscopic    Hemorrhoids    Hypercholesteremia    IBS (irritable bowel  syndrome)    Memory deficits 02/15/2013   RLS (restless legs syndrome)    TIA (transient ischemic attack)    Urinary incontinence    Vitamin D deficiency    Past Surgical History:  Procedure Laterality Date   ABDOMINAL HYSTERECTOMY  02/24/1993   cataract surgery Right 12/19/2021   corneal transplant for keratonconus     EYE SURGERY  02/25/2011   left foot fracture with ORIF  07/25/2004   Dr. Lajoyce Corners   right bunion surgery     Family History  Problem Relation Age of Onset   Hypertension Mother    Anemia Mother    Dementia Mother    Kidney disease Father    Hyperlipidemia Sister    Diabetes Brother    Lung cancer Brother    Dementia Maternal Grandmother    Diabetes  Other        aunt,brother,uncle   Breast cancer Maternal Aunt    Social History   Socioeconomic History   Marital status: Married    Spouse name: Sherilyn Cooter   Number of children: 3   Years of education: college-2   Highest education level: Not on file  Occupational History    Employer: RETIRED  Tobacco Use   Smoking status: Never   Smokeless tobacco: Never  Vaping Use   Vaping status: Never Used  Substance and Sexual Activity   Alcohol use: Yes    Comment: occ   Drug use: Never   Sexual activity: Not on file  Other Topics Concern   Not on file  Social History Narrative   [name]   [place], daughter   [name] info release form to share her medical records with her children   Lives at home w/ her husband   Right-handed   Caffeine: tea on occasion   Social Drivers of Corporate investment banker Strain: Low Risk  (05/07/2023)   Overall Financial Resource Strain (CARDIA)    Difficulty of Paying Living Expenses: Not hard at all  Food Insecurity: No Food Insecurity (05/07/2023)   Hunger Vital Sign    Worried About Running Out of Food in the Last Year: Never true    Ran Out of Food in the Last Year: Never true  Transportation Needs: No Transportation Needs (05/07/2023)   PRAPARE - Administrator, Civil Service (Medical): No    Lack of Transportation (Non-Medical): No  Physical Activity: Insufficiently Active (05/07/2023)   Exercise Vital Sign    Days of Exercise per Week: 2 days    Minutes of Exercise per Session: 30 min  Stress: Stress Concern Present (05/07/2023)   [employer] of Occupational Health - Occupational Stress Questionnaire    Feeling of Stress : To some extent  Social Connections: Moderately Integrated (05/07/2023)   Social Connection and Isolation Panel [NHANES]    Frequency of Communication with Friends and Family: More than three times a week    Frequency of Social Gatherings with Friends and Family:  Once a week    Attends Religious Services: More than 4 times per year    Active Member of [employer] or Organizations: No    Attends Engineer, structural: Never    Marital Status: Married    Tobacco Counseling Counseling given: Not Answered   Clinical Intake:  Pre-visit preparation completed: Yes  Pain : No/denies pain Pain Score: 0-No pain     BMI - recorded: 21.05 Nutritional Risks: None Diabetes: No  How often do you need to have  someone help you when you read instructions, pamphlets, or other written materials from your doctor or pharmacy?: 3 - Sometimes What is the last grade level you completed in school?: College  Interpreter Needed?: No      Activities of Daily Living    05/07/2023    1:24 PM  In your present state of health, do you have any difficulty performing the following activities:  Hearing? 0  Vision? 0  Difficulty concentrating or making decisions? 1  Walking or climbing stairs? 0  Dressing or bathing? 0  Doing errands, shopping? 0  Preparing Food and eating ? N  Using the Toilet? N  In the past six months, have you accidently leaked urine? N  Do you have problems with loss of bowel control? Y  Managing your Medications? N  Managing your Finances? N  Housekeeping or managing your Housekeeping? N    Patient Care Team: [name], NP as PCP - General (Adult Health Nurse Practitioner) Rollene Rotunda, MD as PCP - Cardiology (Cardiology) Cherlyn Roberts, MD as Consulting Physician (Dermatology) Holli Humbles, MD as Referring Physician (Ophthalmology) Butch Penny, NP as Nurse Practitioner (Neurology) Noel Christmas, MD as Consulting Physician (Urology)  Indicate any recent Medical Services you may have received from other than [facility] providers in the past year (date may be approximate).     Assessment:   This is a routine wellness examination for Delaynee.  Hearing/Vision screen Hearing Screening - Comments:: Hearing exam no  issues Vision Screening - Comments:: Eye exam October 2024 pt wears glasses.   Goals Addressed             This Visit's Progress    Increase physical activity   On track    Increase activity, in gym 2 x week.        Depression Screen    05/07/2023    1:27 PM 05/07/2023    1:08 PM 02/06/2023   10:50 AM 07/17/2022   12:48 PM 05/01/2022    1:38 PM 04/18/2022    3:25 PM 07/25/2021    2:04 PM  PHQ 2/9 Scores  PHQ - 2 Score 0 0 0 0 0 0 0  PHQ- 9 Score       0    Fall Risk    03/12/2023    1:12 PM 02/05/2023    3:03 PM 12/11/2022    2:37 PM 11/27/2022    1:18 PM 07/17/2022   12:48 PM  Fall Risk   Falls in the past year? 0 0 0 0 1  Number falls in past yr: 0 0 0 0 0  Injury with Fall? 0 0 0 0 1  Risk for fall due to : No Fall Risks No Fall Risks No Fall Risks No Fall Risks   Follow up Falls evaluation completed;Education provided;Falls prevention discussed Falls evaluation completed;Education provided;Falls prevention discussed Falls evaluation completed;Education provided;Falls prevention discussed Falls evaluation completed;Education provided;Falls prevention discussed     MEDICARE RISK AT HOME: Medicare Risk at Home Any stairs in or around the home?: Yes If so, are there any without handrails?: No Home free of loose throw rugs in walkways, pet beds, electrical cords, etc?: Yes Adequate lighting in your home to reduce risk of falls?: Yes Life alert?: No Use of a cane, walker or w/c?: No Grab bars in the bathroom?: Yes Shower chair or bench in shower?: No Elevated toilet seat or a handicapped toilet?: Yes  TIMED UP AND GO:  Was the test performed?  No  Cognitive Function:    05/01/2022    1:40 PM 09/23/2021    2:39 PM 11/21/2020    1:59 PM 01/07/2018    1:44 PM 05/27/2017   11:04 AM  MMSE - Mini Mental State Exam  Orientation to time 4 5 5 5 5   Orientation to Place 5 5 5 5 5   Registration 3 3 3 3 3   Attention/ Calculation 5 4 5 5 2   Recall 2 3 3 2 3   Language-  name 2 objects 2 2 2 2 2   Language- repeat 1 1 1 1 1   Language- follow 3 step command 1 3 3 3 2   Language- read & follow direction 0 1 1 1 1   Write a sentence 1 1 1 1 1   Copy design 0 1 1 1 1   Total score 24 29 30 29 26       05/08/2022    1:00 PM  Montreal Cognitive Assessment   Visuospatial/ Executive (0/5) 3  Naming (0/3) 3  Attention: Read list of digits (0/2) 2  Attention: Read list of letters (0/1) 1  Attention: Serial 7 subtraction starting at 100 (0/3) 0  Language: Repeat phrase (0/2) 2  Language : Fluency (0/1) 0  Abstraction (0/2) 2  Delayed Recall (0/5) 2  Orientation (0/6) 6  Total 21      05/07/2023    1:13 PM  6CIT Screen  What Year? 0 points  What month? 0 points  What time? 0 points  Count back from 20 0 points  Months in reverse 0 points  Repeat phrase 0 points  Total Score 0 points    Immunizations Immunization History  Administered Date(s) Administered   Diphtheria 04/23/2023   Fluad Quad(high Dose 65+) 12/01/2018, 12/06/2021   Fluad Trivalent(High Dose 65+) 11/27/2022   Influenza Split 11/24/2010, 11/11/2011   Influenza Whole 12/25/2008, 01/15/2010   Influenza, High Dose Seasonal PF 11/19/2015, 12/07/2017   Influenza,inj,Quad PF,6+ Mos 04/04/2014   Influenza-Unspecified 12/26/2014, 12/22/2016   Moderna Covid-19 Fall Seasonal Vaccine 82yrs & older 12/06/2021   PFIZER(Purple Top)SARS-COV-2 Vaccination 03/15/2019, 04/05/2019, 11/25/2019   Pfizer Covid-19 Vaccine Bivalent Booster 22yrs & up 06/04/2021   Pneumococcal Conjugate-13 10/05/2014   Pneumococcal Polysaccharide-23 12/31/2016   Zoster, Live 05/09/2011    TDAP status: Up to date  Flu Vaccine status: Up to date  Pneumococcal vaccine status: Up to date  Covid-19 vaccine status: Completed vaccines  Qualifies for Shingles Vaccine? Yes   Zostavax completed Yes   Shingrix Completed?: No.    Education has been provided regarding the importance of this vaccine. Patient has been advised to  call insurance company to determine out of pocket expense if they have not yet received this vaccine. Advised may also receive vaccine at local pharmacy or Health Dept. Verbalized acceptance and understanding.  Screening Tests Health Maintenance  Topic Date Due   DTaP/Tdap/Td (1 - Tdap) Never done   COVID-19 Vaccine (6 - 2024-25 season) 10/26/2022   MAMMOGRAM  03/20/2023   DEXA SCAN  12/04/2023   Medicare Annual Wellness (AWV)  05/06/2024   Pneumonia Vaccine 27+ Years old  Completed   INFLUENZA VACCINE  Completed   HPV VACCINES  Aged Out   Zoster Vaccines- Shingrix  Discontinued    Health Maintenance  Health Maintenance Due  Topic Date Due   DTaP/Tdap/Td (1 - Tdap) Never done   COVID-19 Vaccine (6 - 2024-25 season) 10/26/2022   MAMMOGRAM  03/20/2023    Colorectal cancer screening: No longer required.   Mammogram  status: Completed 03/19/2022. Repeat every year  Bone Density status: Completed 12/03/2021. Results reflect: Bone density results: OSTEOPENIA. Repeat every 2> will schedule 2026 years.  Lung Cancer Screening: (Low Dose CT Chest recommended if Age 63-80 years, 20 pack-year currently smoking OR have quit w/in 15years.) does not qualify.   Lung Cancer Screening Referral: No  Additional Screening:  Hepatitis C Screening: does not qualify; Completed   Vision Screening: Recommended annual ophthalmology exams for early detection of glaucoma and other disorders of the eye. Is the patient up to date with their annual eye exam?  Yes  Who is the provider or what is the name of the office in which the patient attends annual eye exams? Dr. Linus Orn If pt is not established with a provider, would they like to be referred to a provider to establish care? Yes .   Dental Screening: Recommended annual dental exams for proper oral hygiene  Diabetic Foot Exam: Diabetic Foot Exam: Completed 05/07/2023  Community Resource Referral / Chronic Care Management: CRR required this  visit?  No   CCM required this visit?  No     Plan:     I have personally reviewed and noted the following in the patient's chart:   Medical and social history Use of alcohol, tobacco or illicit drugs  Current medications and supplements including opioid prescriptions. Patient is not currently taking opioid prescriptions. Functional ability and status Nutritional status Physical activity Advanced directives List of other physicians Hospitalizations, surgeries, and ER visits in previous 12 months Vitals Screenings to include cognitive, depression, and falls Referrals and appointments  In addition, I have reviewed and discussed with patient certain preventive protocols, quality metrics, and best practice recommendations. A written personalized care plan for preventive services as well as general preventive health recommendations were provided to patient.     [name], NP   05/07/2023   After Visit Summary: (MyChart) Due to this being a telephonic visit, the after visit summary with patients personalized plan was offered to patient via MyChart   Nurse Notes: passed CIT test, needs mammogram this year, plan to have DEXA next year with mammogram. Will check with [facility] if she has Shingrix vaccine.

## 2023-05-07 NOTE — Patient Instructions (Addendum)
  Ms. Grimsley , Thank you for taking time to come for your Medicare Wellness Visit. I appreciate your ongoing commitment to your health goals. Please review the following plan we discussed and let me know if I can assist you in the future.   These are the goals we discussed:  Goals      Increase physical activity     Increase activity, in gym 2 x week.      Patient Stated     Improve sleep pattern.         This is a list of the screening recommended for you and due dates:  Health Maintenance  Topic Date Due   DTaP/Tdap/Td vaccine (1 - Tdap) Never done   COVID-19 Vaccine (6 - 2024-25 season) 10/26/2022   Mammogram  03/20/2023   DEXA scan (bone density measurement)  12/04/2023   Medicare Annual Wellness Visit  05/06/2024   Pneumonia Vaccine  Completed   Flu Shot  Completed   HPV Vaccine  Aged Out   Zoster (Shingles) Vaccine  Discontinued   Needs mammogram this year, plan to have bone density next year with mammogram. Will check with [facility] if she has Shingrix vaccine. If you have copy bring next week please.   Call [facility] to schedule mammogram for this year.

## 2023-05-14 ENCOUNTER — Encounter: Payer: Medicare HMO | Admitting: Orthopedic Surgery

## 2023-05-15 NOTE — Progress Notes (Signed)
 This encounter was created in error - please disregard.

## 2023-05-20 DIAGNOSIS — Z90710 Acquired absence of both cervix and uterus: Secondary | ICD-10-CM | POA: Diagnosis not present

## 2023-05-20 DIAGNOSIS — Z01419 Encounter for gynecological examination (general) (routine) without abnormal findings: Secondary | ICD-10-CM | POA: Diagnosis not present

## 2023-05-20 DIAGNOSIS — G459 Transient cerebral ischemic attack, unspecified: Secondary | ICD-10-CM | POA: Diagnosis not present

## 2023-05-20 DIAGNOSIS — R350 Frequency of micturition: Secondary | ICD-10-CM | POA: Diagnosis not present

## 2023-05-28 ENCOUNTER — Ambulatory Visit (HOSPITAL_COMMUNITY)
Admission: RE | Admit: 2023-05-28 | Discharge: 2023-05-28 | Disposition: A | Discharge status: Home / Self Care | Payer: Medicare HMO | Source: Ambulatory Visit | Attending: Cardiology | Admitting: Cardiology

## 2023-05-28 ENCOUNTER — Encounter: Payer: Self-pay | Admitting: Orthopedic Surgery

## 2023-05-28 ENCOUNTER — Ambulatory Visit: Admitting: Orthopedic Surgery

## 2023-05-28 ENCOUNTER — Telehealth (INDEPENDENT_AMBULATORY_CARE_PROVIDER_SITE_OTHER): Admitting: Orthopedic Surgery

## 2023-05-28 DIAGNOSIS — R030 Elevated blood-pressure reading, without diagnosis of hypertension: Secondary | ICD-10-CM | POA: Diagnosis not present

## 2023-05-28 DIAGNOSIS — R0602 Shortness of breath: Secondary | ICD-10-CM | POA: Insufficient documentation

## 2023-05-28 DIAGNOSIS — I10 Essential (primary) hypertension: Secondary | ICD-10-CM | POA: Diagnosis not present

## 2023-05-28 DIAGNOSIS — R202 Paresthesia of skin: Secondary | ICD-10-CM

## 2023-05-28 DIAGNOSIS — F32A Depression, unspecified: Secondary | ICD-10-CM | POA: Diagnosis not present

## 2023-05-28 DIAGNOSIS — R5383 Other fatigue: Secondary | ICD-10-CM | POA: Insufficient documentation

## 2023-05-28 DIAGNOSIS — R4189 Other symptoms and signs involving cognitive functions and awareness: Secondary | ICD-10-CM

## 2023-05-28 DIAGNOSIS — R42 Dizziness and giddiness: Secondary | ICD-10-CM

## 2023-05-28 DIAGNOSIS — N3281 Overactive bladder: Secondary | ICD-10-CM | POA: Diagnosis not present

## 2023-05-28 DIAGNOSIS — F419 Anxiety disorder, unspecified: Secondary | ICD-10-CM | POA: Diagnosis not present

## 2023-05-28 DIAGNOSIS — R002 Palpitations: Secondary | ICD-10-CM | POA: Insufficient documentation

## 2023-05-28 LAB — ECHOCARDIOGRAM COMPLETE
AR max vel: 1.8 cm2
AV Area VTI: 1.94 cm2
AV Area mean vel: 1.73 cm2
AV Mean grad: 6 mmHg
AV Peak grad: 13.2 mmHg
Ao pk vel: 1.82 m/s
Area-P 1/2: 2.91 cm2
S' Lateral: 2.45 cm

## 2023-05-28 NOTE — Progress Notes (Signed)
   This service is provided via telemedicine  No vital signs collected/recorded due to the encounter was a telemedicine visit.   Location of patient (ex: home, work):  Home  Patient consents to a telephone visit:  Yes  Location of the provider (ex: office, home):  [employer].  Name of any referring provider:  [name], NP   Names of all persons participating in the telemedicine service and their role in the encounter:  Patient, Daughter [name], Meda Klinefelter, RMA, Hazle Nordmann, NP  Time spent on call: 8 minutes spent on the phone with Medical Assistant.

## 2023-05-28 NOTE — Progress Notes (Signed)
 Careteam: Patient Care Team: Paige Heir, NP as PCP - General (Adult Health Nurse Practitioner) Paige Rotunda, MD as PCP - Cardiology (Cardiology) Paige Roberts, MD as Consulting Physician (Dermatology) Paige Humbles, MD as Referring Physician (Ophthalmology) Paige Penny, NP as Nurse Practitioner (Neurology) Paige Christmas, MD as Consulting Physician (Urology)  Seen by: Paige Pena, AGNP-C  PLACE OF SERVICE:  [facility]  Advanced Directive information    Allergies  Allergen Reactions   Morphine Other (See Comments)    REACTION: lethargy/malaise   Metoclopramide Hcl Anxiety and Other (See Comments)    REACTION: anxious and out of her mind   Aricept [Donepezil Hcl]     nausea   Doxycycline Diarrhea   Penicillins Itching and Rash    Has patient had a PCN reaction causing immediate rash, facial/tongue/throat swelling, SOB or lightheadedness with hypotension: No Has patient had a PCN reaction causing severe rash involving mucus membranes or skin necrosis: No Has patient had a PCN reaction that required hospitalization No Has patient had a PCN reaction occurring within the last 10 years: Yes If all of the above answers are "NO", then may proceed with Cephalosporin use.     Prednisone Itching and Rash   Sulfonamide Derivatives Itching and Rash    Chief Complaint  Patient presents with   Medical Management of Chronic Issues    2 month follow up. Discuss the need for Mammogram.      HPI: Patient is a 83 y.o. female seen today via video visit for medical management of chronic conditions.   Patient provided recent vaccinations per [facility]. Vaccinations recently updated by medical assistant.   She provided recent home blood pressure readings. Most readings averaging < 140/90. Not on antihypertensive at this time. She continues to have ongoing dizziness. Appears dizziness is worse in morning when first getting up. She has seen many specialists including  neurology, ENT, audiology and cardiology. Advanced workup including EMG, MRI brain, reflexes, EKG unremarkable. Admits to adequate hydration with water. She recently saw cardiology for palpitations. Zio patch x 14 days completed> results pending. Previous cardiology note reports 2 runs NSVT with 30 day monitor in past. Recent lab work unremarkable.   She continues to have paresthesias to top of head, behind ears, hands and toes. Unsuccessful trial of gabapentin due to increased dizziness/lightheadedness. Remains on Cymbalta.   Patient admits to depression. She is on Cymbalta. Asking for therapist referral.   Review of Systems:  Review of Systems  Constitutional:  Negative for malaise/fatigue and weight loss.  HENT:  Positive for tinnitus.   Eyes: Negative.   Respiratory:  Negative for shortness of breath.   Cardiovascular:  Positive for palpitations.  Gastrointestinal:  Negative for abdominal pain.  Genitourinary:  Positive for frequency.  Musculoskeletal:  Negative for joint pain.  Skin: Negative.   Neurological:  Positive for dizziness and sensory change. Negative for weakness.  Psychiatric/Behavioral:  Positive for depression and memory loss. The patient does not have insomnia.     Past Medical History:  Diagnosis Date   Acute cystitis    Anxiety    Aphasia    transsient aphasia Jan 2012   Asthmatic bronchitis    Cerebrovascular disease    Diverticulosis of colon    DJD (degenerative joint disease)    Dysphonia    Esophageal stricture    Fibromyalgia    Gastroparesis    GERD (gastroesophageal reflux disease)    Hematuria, microscopic    Hemorrhoids    Hypercholesteremia  IBS (irritable bowel syndrome)    Memory deficits 02/15/2013   RLS (restless legs syndrome)    TIA (transient ischemic attack)    Urinary incontinence    Vitamin D deficiency    Past Surgical History:  Procedure Laterality Date   ABDOMINAL HYSTERECTOMY  02/24/1993   cataract surgery Right  12/19/2021   corneal transplant for keratonconus     EYE SURGERY  02/25/2011   left foot fracture with ORIF  07/25/2004   Dr. Lajoyce Corners   right bunion surgery     Social History:   reports that she has never smoked. She has never used smokeless tobacco. She reports current alcohol use. She reports that she does not use drugs.  Family History  Problem Relation Age of Onset   Hypertension Mother    Anemia Mother    Dementia Mother    Kidney disease Father    Hyperlipidemia Sister    Diabetes Brother    Lung cancer Brother    Dementia Maternal Grandmother    Diabetes Other        aunt,brother,uncle   Breast cancer Maternal Aunt     Medications: Patient's Medications  New Prescriptions   No medications on file  Previous Medications   ASPIRIN 325 MG EC TABLET    Take 325 mg by mouth daily.   ATORVASTATIN (LIPITOR) 20 MG TABLET    Take 1 tablet (20 mg total) by mouth daily.   AZELASTINE (ASTELIN) 0.1 % NASAL SPRAY    Place 2 sprays into both nostrils 2 (two) times daily. Use in each nostril as directed   BIOTIN 1000 MCG TABLET    Take 1,000 mcg by mouth 3 (three) times daily.   CHOLECALCIFEROL (VITAMIN D) 50 MCG (2000 UT) CAPS    Take by mouth.   COLLAGEN-VITAMIN C PO    Take by mouth.   CYANOCOBALAMIN (VITAMIN B12) 1000 MCG TABLET    Take 1 tablet (1,000 mcg total) by mouth daily.   DULOXETINE (CYMBALTA) 60 MG CAPSULE    Take 1 capsule (60 mg total) by mouth daily.   FLUTICASONE (FLONASE) 50 MCG/ACT NASAL SPRAY    Place 2 sprays into both nostrils daily as needed for allergies.   HYDROXYZINE (VISTARIL) 25 MG CAPSULE    Take 1 capsule (25 mg total) by mouth daily as needed. For panic attack or increased anxiety   LEVOCETIRIZINE (XYZAL) 5 MG TABLET    Take 5 mg by mouth as needed.   OMEGA-3 FATTY ACIDS (FISH OIL) 1000 MG CAPS    Take by mouth.   OMEPRAZOLE (PRILOSEC) 40 MG CAPSULE    TAKE 1 CAPSULE TWICE DAILY   PREDNISOLONE ACETATE (PRED FORTE) 1 % OPHTHALMIC SUSPENSION    Place 1  drop into both eyes as directed.  Modified Medications   No medications on file  Discontinued Medications   No medications on file    Physical Exam:  There were no vitals filed for this visit. There is no height or weight on file to calculate BMI. Wt Readings from Last 3 Encounters:  05/07/23 111 lb 6.4 oz (50.5 kg)  04/24/23 116 lb (52.6 kg)  04/14/23 120 lb (54.4 kg)    Physical Exam Vitals (Exam limited due to video visit) reviewed.  Constitutional:      General: She is not in acute distress. Neurological:     Mental Status: She is alert.     Labs reviewed: Basic Metabolic Panel: Recent Labs    07/17/22 1331 11/24/22 1040  04/24/23 1455  NA 139 140 143  K 4.6 3.9 3.9  CL 102 103 104  CO2 31 27 27   GLUCOSE 87 93 84  BUN 13 22 22   CREATININE 0.82 0.88 0.78  CALCIUM 9.0 9.2 9.3  MG  --   --  1.7  TSH  --   --  1.360   Liver Function Tests: Recent Labs    07/17/22 1331 11/24/22 1040  AST 22 18  ALT 16 10  BILITOT 0.5 0.3  PROT 6.3 6.9   No results for input(s): "LIPASE", "AMYLASE" in the last 8760 hours. No results for input(s): "AMMONIA" in the last 8760 hours. CBC: Recent Labs    07/17/22 1331 11/24/22 1040 04/24/23 1455  WBC 4.4 5.7 10.1  NEUTROABS 2,715 3,414  --   HGB 12.9 12.8 12.1  HCT 38.4 40.1 36.3  MCV 90.6 92.8 90  PLT 160 246 165   Lipid Panel: No results for input(s): "CHOL", "HDL", "LDLCALC", "TRIG", "CHOLHDL", "LDLDIRECT" in the last 8760 hours. TSH: Recent Labs    04/24/23 1455  TSH 1.360   A1C: Lab Results  Component Value Date   HGBA1C 5.8 (H) 11/24/2022     Assessment/Plan 1. Dizziness (Primary) - ongoing - increased in AM - evaluation with several specialists> ENT, audiology, neuro, cardio - Zio patch x 14 days> results pending - h/o NSVT a few years ago> captured by 30 days monitor - recent lab work unremarkable - ? BPPV - discussed Dix Hallpike maneuver - recommend good hydration with water> drink one  glass every morning when waking up  2. Elevated blood pressure reading - resolved - SBP < 140/80 per home blood pressures - do not recommend antihypertensive at this time  3. Paresthesias - ongoing - unsuccessful trial gabapentin due to dizziness - nerve conduction studies negative  4. Cognitive impairment - MOCA 21/30 - not on medication   5. OAB (overactive bladder) - completed pelvic floor therapy - given samples of Gemtesa  6. Anxiety and depression - ongoing - requested counseling referral several times> recommendations given but never has completed - suspect symptoms worsened with cognitive impairment - cont Cymbalta and hydroxyzine  Total time: 27 minutes. Greater than 50% of total time spent doing patient education regarding dizziness, elevated blood pressure, paresthesias and anxiety/depression including symptom/medication management.     Next appt: Visit date not found  Paige Pena Scherry Ran  [facility] [phone]

## 2023-06-01 ENCOUNTER — Telehealth: Payer: Self-pay

## 2023-06-01 NOTE — Telephone Encounter (Signed)
-----   Message from [facility] sent at 06/01/2023  1:19 PM EDT ----- Please let Ms. Dewan know that her echocardiogram shows normal heart squeeze and function.  There is moderate thickening and mild stiffening of the left lower chamber of the heart, this can occur with a history of hypertension and with aging.  Will need to work towards good blood pressure control.  Her cardiac monitor results are still currently pending, will be able to provide further recommendations once those have resulted.

## 2023-06-01 NOTE — Telephone Encounter (Signed)
 Called patient advised of below they verbalized understanding.

## 2023-06-03 DIAGNOSIS — R002 Palpitations: Secondary | ICD-10-CM | POA: Diagnosis not present

## 2023-06-03 DIAGNOSIS — K625 Hemorrhage of anus and rectum: Secondary | ICD-10-CM | POA: Diagnosis not present

## 2023-06-03 DIAGNOSIS — R159 Full incontinence of feces: Secondary | ICD-10-CM | POA: Diagnosis not present

## 2023-06-05 ENCOUNTER — Other Ambulatory Visit: Payer: Self-pay | Admitting: Orthopedic Surgery

## 2023-06-05 DIAGNOSIS — F32A Depression, unspecified: Secondary | ICD-10-CM

## 2023-06-05 DIAGNOSIS — E785 Hyperlipidemia, unspecified: Secondary | ICD-10-CM

## 2023-06-07 DIAGNOSIS — R002 Palpitations: Secondary | ICD-10-CM

## 2023-06-09 ENCOUNTER — Telehealth: Payer: Self-pay | Admitting: Cardiology

## 2023-06-09 NOTE — Telephone Encounter (Signed)
 Called patient to discuss cardiac monitor results, no answer, left callback number.

## 2023-06-22 DIAGNOSIS — N6001 Solitary cyst of right breast: Secondary | ICD-10-CM | POA: Diagnosis not present

## 2023-06-22 DIAGNOSIS — R92323 Mammographic fibroglandular density, bilateral breasts: Secondary | ICD-10-CM | POA: Diagnosis not present

## 2023-06-22 DIAGNOSIS — N6312 Unspecified lump in the right breast, upper inner quadrant: Secondary | ICD-10-CM | POA: Diagnosis not present

## 2023-06-22 DIAGNOSIS — N644 Mastodynia: Secondary | ICD-10-CM | POA: Diagnosis not present

## 2023-06-29 ENCOUNTER — Ambulatory Visit: Payer: Medicare HMO | Admitting: Cardiology

## 2023-07-15 DIAGNOSIS — H2512 Age-related nuclear cataract, left eye: Secondary | ICD-10-CM | POA: Diagnosis not present

## 2023-07-15 DIAGNOSIS — Z961 Presence of intraocular lens: Secondary | ICD-10-CM | POA: Diagnosis not present

## 2023-07-15 DIAGNOSIS — H52213 Irregular astigmatism, bilateral: Secondary | ICD-10-CM | POA: Diagnosis not present

## 2023-07-15 DIAGNOSIS — Z947 Corneal transplant status: Secondary | ICD-10-CM | POA: Diagnosis not present

## 2023-07-20 NOTE — Progress Notes (Unsigned)
  Cardiology Office Note:   Date:  07/22/2023  ID:  Paige Pena, DOB Oct 12, 1940, MRN [id number] PCP: Arnetha Bhat, NP  [facility] Providers Cardiologist:  Eilleen Grates, MD {  History of Present Illness:   Paige Pena is a 83 y.o. female  who presents for evaluation of palpitations.    She has had some TIAs or strokes  Echo in 2017 as part of a neurology work up  demonstrated no significant abnormalities.   She wore a monitor in April 2025. There were occasional runs of SVT with the longest being 20 beats.     She presents for follow up.  The patient denies any new symptoms such as chest discomfort, neck or arm discomfort. There has been no new shortness of breath, PND or orthopnea. There have been no reported palpitations, presyncope or syncope.   She has occasional dizziness with sudden movement but she has not had recurrent falls.      She is going to an exercise class with her husband.  She is limited by some vision problems.  ROS: As stated in the HPI and negative for all other systems.  Studies Reviewed:    EKG:     NA   Risk Assessment/Calculations:         Physical Exam:   VS:  BP 138/76 (BP Location: Left Arm, Patient Position: Sitting)   Pulse 65   Ht 5\' 1"  (1.549 m)   Wt 109 lb 3.2 oz (49.5 kg)   SpO2 98%   BMI 20.63 kg/m    Wt Readings from Last 3 Encounters:  07/22/23 109 lb 3.2 oz (49.5 kg)  05/07/23 111 lb 6.4 oz (50.5 kg)  04/24/23 116 lb (52.6 kg)     GEN: Well nourished, well developed in no acute distress NECK: No JVD; No carotid bruits CARDIAC: RRR, no murmurs, rubs, gallops RESPIRATORY:  Clear to auscultation without rales, wheezing or rhonchi  ABDOMEN: Soft, non-tender, non-distended EXTREMITIES:  No edema; No deformity   ASSESSMENT AND PLAN:   Palpitations:   She had SVT.    She has no new symptoms really related to this.  We talked about this and her daughter was on the phone from [place].  At this point she wants conservative  therapy and I agree with this.   She will let me know in the future if this occurs more symptomatically and we will change management plans.  Hypertension blood pressure: Her blood pressure is at target.  No change in therapy.      Follow up with me as needed  Signed, Eilleen Grates, MD

## 2023-07-22 ENCOUNTER — Ambulatory Visit: Payer: Medicare HMO | Attending: Cardiology | Admitting: Cardiology

## 2023-07-22 ENCOUNTER — Encounter: Payer: Self-pay | Admitting: Cardiology

## 2023-07-22 VITALS — BP 138/76 | HR 65 | Ht 61.0 in | Wt 109.2 lb

## 2023-07-22 DIAGNOSIS — I471 Supraventricular tachycardia, unspecified: Secondary | ICD-10-CM | POA: Diagnosis not present

## 2023-07-22 DIAGNOSIS — I10 Essential (primary) hypertension: Secondary | ICD-10-CM | POA: Diagnosis not present

## 2023-07-22 DIAGNOSIS — R002 Palpitations: Secondary | ICD-10-CM

## 2023-07-22 NOTE — Patient Instructions (Signed)
 Medication Instructions:  Your physician recommends that you continue on your current medications as directed. Please refer to the Current Medication list given to you today.  *If you need a refill on your cardiac medications before your next appointment, please call your pharmacy*  Follow-Up: At [facility], you and your health needs are our priority.  As part of our continuing mission to provide you with exceptional heart care, our providers are all part of one team.  This team includes your primary Cardiologist (physician) and Advanced Practice Providers or APPs (Physician Assistants and Nurse Practitioners) who all work together to provide you with the care you need, when you need it.  Your next appointment:   We will see you on an as needed basis   Provider:   Eilleen Grates, MD    We recommend signing up for the patient portal called "MyChart".  Sign up information is provided on this After Visit Summary.  MyChart is used to connect with patients for Virtual Visits (Telemedicine).  Patients are able to view lab/test results, encounter notes, upcoming appointments, etc.  Non-urgent messages can be sent to your provider as well.   To learn more about what you can do with MyChart, go to [email or web address].

## 2023-07-28 DIAGNOSIS — R159 Full incontinence of feces: Secondary | ICD-10-CM | POA: Diagnosis not present

## 2023-07-28 DIAGNOSIS — K219 Gastro-esophageal reflux disease without esophagitis: Secondary | ICD-10-CM | POA: Diagnosis not present

## 2023-08-11 ENCOUNTER — Encounter: Payer: Self-pay | Admitting: Neurology

## 2023-08-11 ENCOUNTER — Ambulatory Visit: Admitting: Neurology

## 2023-08-17 ENCOUNTER — Encounter: Payer: Self-pay | Admitting: Neurology

## 2023-08-17 ENCOUNTER — Ambulatory Visit (INDEPENDENT_AMBULATORY_CARE_PROVIDER_SITE_OTHER): Admitting: Neurology

## 2023-08-17 VITALS — BP 128/57 | HR 79 | Resp 15 | Ht 60.0 in | Wt 109.1 lb

## 2023-08-17 DIAGNOSIS — R4189 Other symptoms and signs involving cognitive functions and awareness: Secondary | ICD-10-CM

## 2023-08-17 DIAGNOSIS — R202 Paresthesia of skin: Secondary | ICD-10-CM | POA: Diagnosis not present

## 2023-08-17 DIAGNOSIS — M47812 Spondylosis without myelopathy or radiculopathy, cervical region: Secondary | ICD-10-CM

## 2023-08-17 MED ORDER — MEMANTINE HCL 10 MG PO TABS: 10.0000 mg | ORAL_TABLET | Freq: Two times a day (BID) | ORAL | 11 refills | Status: AC

## 2023-08-17 NOTE — Progress Notes (Signed)
 Chief Complaint  Patient presents with   Follow-up    Rm15, husband present, light headed: intermittent for the past 6 months, ORTHOSTATIC BP (pt was late unable to obtain in a timely manner),  & brain fog: pt mentioned hit her head the other day on corner but never evaluated. Mmse score of 27       ASSESSMENT AND PLAN  Paige Pena is a 83 y.o. female   Paresthesia Cervical spondylosis  EMG nerve conduction study in June 2024 showed no evidence of active cervical radiculopathy, no focal neuropathy,  Hyperreflexia on examination bilateral Babinski signs, worsening urinary urgency occasional incontinence,   MRI of cervical spine in Feb 2024, showed multilevel degenerative changes, mild canal stenosis at C3-4, preferentially involving left side, severe bilateral foraminal stenosis, C6-7 also mild canal stenosis, variable degree of foraminal narrowing,  She function well, I suggested conservative treatment.   Cognitive Impairment  MOCA 21/30  Strong family history of dementia  MRI of brain w/wo in May 2024 showed no acute abnormality  Lab in June 2023 showed normal B12, vitamin D , TSH  Discussed with patient and her family, want to proceed ATN profile and APO E genotyping  Trial of Namenda 10 mg bid   DIAGNOSTIC DATA (LABS, IMAGING, TESTING) - I reviewed patient records, labs, notes, testing and imaging myself where available.   MEDICAL HISTORY:  Paige Pena is a 83 year old female, seen in request by [facility] primary care nurse practitioner Gil, Amy E, for evaluation of gait abnormality, numbness at both hands and feet, initial evaluation January 28, 2022   I reviewed and summarized the referring note.PMHX HLD GERD   Since beginning of 2023 she noticed numbness tingling in bilateral feet and hands, gradually getting worse, also noticed mild unsteady gait  This happened following she fell off the ladder earlier, she was stepping up on a ladder of 5 of 6 feet,  wiping her mirror,  missing steps going down, fell backwards,  She denies bowel or bladder incontinence,   CT chest in July 2023 for evaluation of lung nodule, there is evidence of multilevel cervical degenerative changes  UPDATE May 08 2022: She retired as Immunologist, used to enjoy playing piano, reading, now due to vision issues, less active, complains of intermittent left hand foot numbness, no pain, uncomfortable, left leg draggy when walking, mild neck discomfort, urinary urgency, wear pad.  MRI of cervical on Mar 26 2022, level degenerative changes, most obvious at C3-4, facet hypertrophy mild canal stenosis severe biforaminal stenosis, C6-7, mild canal stenosis, variable degree of foraminal narrowing and  She also complains of brain fog, examination 21/30 today, strong family history of dementia, mother, maternal uncle, aunt, grandmother, suffered memory loss  Laboratory evaluation in June 2023, normal B12, folic acid , CBC,Hg 13.2 vitamin D , TSH, CMP, elevated LDL 137 total cholesterol 771, ferritin 12.  UPDATE June 26th 2024: Return for electrodiagnostic study today, for evaluation of her left hand paresthesia, left foot cramping, dragging her left foot some while walking, also complains of worsening urinary urgency occasionally accident  EMG nerve conduction study showed no large fiber neuropathy, no evidence of left cervical or lumbar radiculopathy.  There is no evidence of left carpal tunnel syndrome   UPDATE August 17 2023: She continues to have mild memory issue, MMSE 27/30, she goes to [employer] twice a week, riding bikes, muscle training with personal trainer, drives herself, sleeps well, eats well.  Her maternal grandmother and mother had dementia in  their old age.  I also updated information with her daughter Darice via phone call.  PHYSICAL EXAM:       08/17/2023    9:11 AM 07/22/2023   12:47 PM 05/07/2023    1:15 PM  Vitals with BMI  Height 5' 0 5' 1 5' 1  Weight  109 lbs 2 oz 109 lbs 3 oz 111 lbs 6 oz  BMI 21.31 20.64 21.06  Systolic 128 138 883  Diastolic 57 76 68  Pulse 79 65 89     Gen: NAD, conversant, well nourised, well groomed                     Cardiovascular: Regular rate rhythm, no peripheral edema, warm, nontender. Eyes: Conjunctivae clear without exudates or hemorrhage Neck: Supple, no carotid bruits. Pulmonary: Clear to auscultation bilaterally   NEUROLOGICAL EXAM:  MENTAL STATUS: Speech/cognition: Awake, alert, oriented to history taking and casual conversation      08/17/2023    9:24 AM 05/01/2022    1:40 PM 09/23/2021    2:39 PM  MMSE - Mini Mental State Exam  Orientation to time 5 4 5   Orientation to Place 5 5 5   Registration 3 3 3   Attention/ Calculation 5 5 4   Recall 1 2 3   Language- name 2 objects 2 2 2   Language- repeat 1 1 1   Language- follow 3 step command 3 1 3   Language- read & follow direction 1 0 1  Write a sentence 1 1 1   Copy design 0 0 1  Total score 27 24 29     CRANIAL NERVES: CN II: Visual fields are full to confrontation. Left pupil is round equal and briskly reactive to light.  S/p right corneal transplant   CN III, IV, VI: extraocular movement are normal. No ptosis. CN V: Facial sensation is intact to light touch CN VII: Face is symmetric with normal eye closure  CN VIII: Hearing is normal to causal conversation. CN IX, X: Phonation is normal. CN XI: Head turning and shoulder shrug are intact  MOTOR: no significant weakness  REFLEXES: Reflexes are 3  and symmetric at the biceps, triceps, knees, and ankles. Plantar responses are extensor bilaterally  SENSORY: Intact to light touch, pinprick and vibratory sensation are intact in fingers and toes.  COORDINATION: There is no trunk or limb dysmetria noted.  GAIT/STANCE:Can getup from seated position arm crossed, mild stiff, still stable, drag left leg some.  REVIEW OF SYSTEMS:  Full 14 system review of systems performed and notable only  for as above All other review of systems were negative.   ALLERGIES: Allergies  Allergen Reactions   Morphine Other (See Comments)    REACTION: lethargy/malaise   Metoclopramide Hcl Anxiety and Other (See Comments)    REACTION: anxious and out of her mind   Aricept  [Donepezil  Hcl]     nausea   Doxycycline  Diarrhea   Penicillins Itching and Rash    Has patient had a PCN reaction causing immediate rash, facial/tongue/throat swelling, SOB or lightheadedness with hypotension: No Has patient had a PCN reaction causing severe rash involving mucus membranes or skin necrosis: No Has patient had a PCN reaction that required hospitalization No Has patient had a PCN reaction occurring within the last 10 years: Yes If all of the above answers are NO, then may proceed with Cephalosporin use.     Prednisone Itching and Rash   Sulfonamide Derivatives Itching and Rash    HOME MEDICATIONS: Current Outpatient  Medications  Medication Sig Dispense Refill   aspirin  325 MG EC tablet Take 325 mg by mouth daily.     atorvastatin  (LIPITOR) 20 MG tablet TAKE 1 TABLET EVERY DAY 90 tablet 3   azelastine  (ASTELIN ) 0.1 % nasal spray Place 2 sprays into both nostrils 2 (two) times daily. Use in each nostril as directed 30 mL 12   Biotin 1000 MCG tablet Take 1,000 mcg by mouth 3 (three) times daily.     Cholecalciferol (VITAMIN D ) 50 MCG (2000 UT) CAPS Take by mouth.     COLLAGEN-VITAMIN C PO Take by mouth.     cyanocobalamin  (VITAMIN B12) 1000 MCG tablet Take 1 tablet (1,000 mcg total) by mouth daily. 30 tablet 3   DULoxetine  (CYMBALTA ) 60 MG capsule TAKE 1 CAPSULE EVERY DAY 90 capsule 3   fluticasone  (FLONASE ) 50 MCG/ACT nasal spray Place 2 sprays into both nostrils daily as needed for allergies. 16 g 3   gabapentin  (NEURONTIN ) 100 MG capsule Take 100 mg by mouth 3 (three) times daily.     hydrOXYzine  (VISTARIL ) 25 MG capsule Take 1 capsule (25 mg total) by mouth daily as needed. For panic attack or  increased anxiety 30 capsule 0   levocetirizine (XYZAL) 5 MG tablet Take 5 mg by mouth as needed.     Omega-3 Fatty Acids (FISH OIL) 1000 MG CAPS Take by mouth.     omeprazole  (PRILOSEC) 40 MG capsule TAKE 1 CAPSULE TWICE DAILY 180 capsule 1   prednisoLONE  acetate (PRED FORTE ) 1 % ophthalmic suspension Place 1 drop into both eyes as directed.     No current facility-administered medications for this visit.    PAST MEDICAL HISTORY: Past Medical History:  Diagnosis Date   Acute cystitis    Anxiety    Aphasia    transsient aphasia Jan 2012   Asthmatic bronchitis    Cerebrovascular disease    Diverticulosis of colon    DJD (degenerative joint disease)    Dysphonia    Esophageal stricture    Fibromyalgia    Gastroparesis    GERD (gastroesophageal reflux disease)    Hematuria, microscopic    Hemorrhoids    Hypercholesteremia    IBS (irritable bowel syndrome)    Memory deficits 02/15/2013   RLS (restless legs syndrome)    TIA (transient ischemic attack)    Urinary incontinence    Vitamin D  deficiency     PAST SURGICAL HISTORY: Past Surgical History:  Procedure Laterality Date   ABDOMINAL HYSTERECTOMY  02/24/1993   cataract surgery Right 12/19/2021   corneal transplant for keratonconus     EYE SURGERY  02/25/2011   left foot fracture with ORIF  07/25/2004   Dr. Harden   right bunion surgery      FAMILY HISTORY: Family History  Problem Relation Age of Onset   Hypertension Mother    Anemia Mother    Dementia Mother    Kidney disease Father    Hyperlipidemia Sister    Diabetes Brother    Lung cancer Brother    Dementia Maternal Grandmother    Diabetes Other        aunt,brother,uncle   Breast cancer Maternal Aunt     SOCIAL HISTORY: Social History   Socioeconomic History   Marital status: Married    Spouse name: Victory   Number of children: 3   Years of education: college-2   Highest education level: Not on file  Occupational History    Employer: RETIRED   Tobacco Use  Smoking status: Never   Smokeless tobacco: Never  Vaping Use   Vaping status: Never Used  Substance and Sexual Activity   Alcohol use: Yes    Comment: rarely   Drug use: Never   Sexual activity: Not on file  Other Topics Concern   Not on file  Social History Narrative   Darice Blackwater   [place], daughter   Katori Wirsing info release form to share her medical records with her children   Lives at home w/ her husband   Right-handed   Caffeine: tea on occasion   Social Drivers of Corporate investment banker Strain: Low Risk  (05/07/2023)   Overall Financial Resource Strain (CARDIA)    Difficulty of Paying Living Expenses: Not hard at all  Food Insecurity: No Food Insecurity (05/07/2023)   Hunger Vital Sign    Worried About Running Out of Food in the Last Year: Never true    Ran Out of Food in the Last Year: Never true  Transportation Needs: No Transportation Needs (05/07/2023)   PRAPARE - Administrator, Civil Service (Medical): No    Lack of Transportation (Non-Medical): No  Physical Activity: Insufficiently Active (05/07/2023)   Exercise Vital Sign    Days of Exercise per Week: 2 days    Minutes of Exercise per Session: 30 min  Stress: Stress Concern Present (05/07/2023)   [employer] of Occupational Health - Occupational Stress Questionnaire    Feeling of Stress : To some extent  Social Connections: Moderately Integrated (05/07/2023)   Social Connection and Isolation Panel    Frequency of Communication with Friends and Family: More than three times a week    Frequency of Social Gatherings with Friends and Family: Once a week    Attends Religious Services: More than 4 times per year    Active Member of [employer] or Organizations: No    Attends Banker Meetings: Never    Marital Status: Married  Catering manager Violence: Not At Risk (05/07/2023)   Humiliation, Afraid, Rape, and Kick questionnaire     Fear of Current or Ex-Partner: No    Emotionally Abused: No    Physically Abused: No    Sexually Abused: No      Modena Callander, M.D. Ph.D.  [facility] [address] [place], [place] [address] Ph: [phone] Fax: [phone]  CC:  Gil Greig BRAVO, NP [address] [place],  [place] [address]  Gil Greig BRAVO, NP    I personally spent a total of 40 minutes in the care of the patient today including preparing to see the patient, getting/reviewing separately obtained history, performing a medically appropriate exam/evaluation, counseling and educating, placing orders, referring and communicating with other health care professionals, documenting clinical information in the EHR, independently interpreting results, communicating results, and coordinating care.

## 2023-08-20 LAB — APOE ALZHEIMER'S RISK

## 2023-08-26 ENCOUNTER — Ambulatory Visit: Payer: Self-pay | Admitting: Neurology

## 2023-08-26 LAB — ATN PROFILE
A -- Beta-amyloid 42/40 Ratio: 0.115 (ref >0.102)
Beta-amyloid 40: 144.53 pg/mL
Beta-amyloid 42: 16.58 pg/mL
N -- NfL, Plasma: 5.69 pg/mL (ref 0.00–9.13)
T -- p-tau181: 1 pg/mL — ABNORMAL HIGH (ref 0.00–0.97)

## 2023-08-26 LAB — APOE ALZHEIMER'S RISK

## 2023-08-27 NOTE — Telephone Encounter (Signed)
 Pt is asking to be called with an explanation of the results

## 2023-10-08 ENCOUNTER — Telehealth: Payer: Self-pay

## 2023-10-08 NOTE — Telephone Encounter (Signed)
 Copied from CRM #[id number]. Topic: General - Other >> Oct 07, 2023  5:00 PM Adrianna P wrote: Reason for CRM: patient wants to switch her pcp to [facility] to she doesn't have to pay a copay  Copy of CRM was given to Research officer, political party to address

## 2023-10-19 ENCOUNTER — Ambulatory Visit: Admitting: Podiatry

## 2023-11-04 ENCOUNTER — Encounter: Payer: Self-pay | Admitting: Podiatry

## 2023-11-04 ENCOUNTER — Ambulatory Visit: Admitting: Podiatry

## 2023-11-04 VITALS — Ht 60.0 in | Wt 109.0 lb

## 2023-11-04 DIAGNOSIS — M2012 Hallux valgus (acquired), left foot: Secondary | ICD-10-CM | POA: Diagnosis not present

## 2023-11-04 NOTE — Progress Notes (Signed)
 Chief Complaint  Patient presents with   Hammer Toe    Pt is here due to hammertoe on the right foot, states pain to the toes while wearing certain shoes, states she was told that surgery is out due to age, wants to know what could be done, questions regarding tennis shoes.    Subjective: 83 y.o. female presents today for follow-up evaluation of pain and tenderness to the bilateral feet  Brief history: Patient states that several years prior she sustained a fracture to her left foot and underwent surgery.  Since that time she has had pain and tenderness to her left great toe joint and she says over the last few years her bunion deformity has actually enlarged and become more symptomatic.  She wears good supportive tennis shoes.  She presents to have it evaluated today  Past Medical History:  Diagnosis Date   Acute cystitis    Anxiety    Aphasia    transsient aphasia Jan 2012   Asthmatic bronchitis    Cerebrovascular disease    Diverticulosis of colon    DJD (degenerative joint disease)    Dysphonia    Esophageal stricture    Fibromyalgia    Gastroparesis    GERD (gastroesophageal reflux disease)    Hematuria, microscopic    Hemorrhoids    Hypercholesteremia    IBS (irritable bowel syndrome)    Memory deficits 02/15/2013   RLS (restless legs syndrome)    TIA (transient ischemic attack)    Urinary incontinence    Vitamin D  deficiency     Past Surgical History:  Procedure Laterality Date   ABDOMINAL HYSTERECTOMY  02/24/1993   cataract surgery Right 12/19/2021   corneal transplant for keratonconus     EYE SURGERY  02/25/2011   left foot fracture with ORIF  07/25/2004   Dr. Harden   right bunion surgery      Allergies  Allergen Reactions   Morphine Other (See Comments)    REACTION: lethargy/malaise   Metoclopramide Hcl Anxiety and Other (See Comments)    REACTION: anxious and out of her mind   Aricept  [Donepezil  Hcl]     nausea   Doxycycline  Diarrhea   Penicillins  Itching and Rash    Has patient had a PCN reaction causing immediate rash, facial/tongue/throat swelling, SOB or lightheadedness with hypotension: No Has patient had a PCN reaction causing severe rash involving mucus membranes or skin necrosis: No Has patient had a PCN reaction that required hospitalization No Has patient had a PCN reaction occurring within the last 10 years: Yes If all of the above answers are NO, then may proceed with Cephalosporin use.     Prednisone Itching and Rash   Sulfonamide Derivatives Itching and Rash     Objective: Physical Exam General: The patient is alert and oriented x3 in no acute distress.  Dermatology: Skin is cool, dry and supple bilateral lower extremities. Negative for open lesions or macerations.  Vascular: Palpable pedal pulses bilaterally. No edema or erythema noted. Capillary refill within normal limits.  Neurological: Grossly intact via light touch  Musculoskeletal Exam: Clinical evidence of bunion deformity noted to the respective foot. There is moderate pain on palpation range of motion of the first MPJ. Lateral deviation of the hallux noted consistent with hallux abductovalgus.  Radiographic Exam B/L feet 11/24/2022:  LEFT: Normal osseous mineralization.  No acute fractures identified.  Orthopedic hardware noted to the first metatarsal cuneiform as well as the second metatarsal cuneiform joints consistent with the  patient's given history of midfoot fracture several years prior.  Severe hallux valgus deformity also noted with increased intermetatarsal angle and lateral deviation of the great toe RIGHT: Prior bunion surgery with K wire fixation noted to the first metatarsal.  Arthroplasty also noted to the fifth digit of the right foot.  No acute fracture identified.  Assessment: 1.  Hallux valgus left 2. H/o ORIF LT midfoot fracture/dislocations several years prior 3.  Hammertoes lesser digits bilateral; minimally symptomatic  Plan of  Care:  -Patient was evaluated. X-Rays reviewed again today. - Today we had a very long lengthy discussion regarding the patient's symptoms and surgery versus conservative management.  Unfortunately she continues to have pain and tenderness on a daily basis and she is very frustrated with the bunion deformity pressing against the adjacent second digit.  She would like to pursue surgery at this time.  She also contacted her daughter on the telephone and was involved in the discussion as well.  Risk benefits advantages and disadvantages as well as the postoperative recovery course were explained in length in detail to the patient.  No guarantees were expressed or implied.  After discussion with the patient as well as her daughter she would like to proceed with surgery -Authorization for surgery was initiated today.  Surgery will consist of left great toe arthrodesis -Given the patient's age she is actually very healthy and active.  This is affecting her daily quality of life and she would like to have this fixed -Return to clinic 1 week postop   Thresa EMERSON Sar, DPM [facility]  Dr. Thresa EMERSON Sar, DPM    [address] [place], [place] [address]                Office [phone]  Fax [phone]

## 2023-11-10 ENCOUNTER — Telehealth: Payer: Self-pay | Admitting: Neurology

## 2023-11-10 DIAGNOSIS — M47812 Spondylosis without myelopathy or radiculopathy, cervical region: Secondary | ICD-10-CM

## 2023-11-10 DIAGNOSIS — R4189 Other symptoms and signs involving cognitive functions and awareness: Secondary | ICD-10-CM

## 2023-11-10 DIAGNOSIS — R202 Paresthesia of skin: Secondary | ICD-10-CM

## 2023-11-10 NOTE — Telephone Encounter (Signed)
 Patient said experiencing numbness in left leg and foot, tingling in left hand, and little finger closing up on its own and have to pull it open. Have not seen PCP. Would like a call from the nurse.

## 2023-11-10 NOTE — Telephone Encounter (Signed)
 Returned call to pt who stated that her left foot, left leg(feels heavy), and pinky finger left hand and folds up on its on (contracture). Pt wants to know if there is any PT that she would qualify for. Her grandaughter is an occupational therapist and mentioned something called neuro therapy.

## 2023-11-10 NOTE — Telephone Encounter (Signed)
Orders Placed This Encounter  Procedures   Ambulatory referral to Physical Therapy   Ambulatory referral to Occupational Therapy    

## 2023-11-11 ENCOUNTER — Telehealth: Payer: Self-pay | Admitting: Podiatry

## 2023-11-11 NOTE — Telephone Encounter (Signed)
 Received surgical consent form   Left message for pt to call to get her surgery scheduled with Dr Janit

## 2023-11-18 DIAGNOSIS — H2512 Age-related nuclear cataract, left eye: Secondary | ICD-10-CM | POA: Diagnosis not present

## 2023-11-18 DIAGNOSIS — Z961 Presence of intraocular lens: Secondary | ICD-10-CM | POA: Diagnosis not present

## 2023-11-18 DIAGNOSIS — Z947 Corneal transplant status: Secondary | ICD-10-CM | POA: Diagnosis not present

## 2023-11-18 DIAGNOSIS — H52213 Irregular astigmatism, bilateral: Secondary | ICD-10-CM | POA: Diagnosis not present

## 2023-11-23 ENCOUNTER — Encounter: Payer: Medicare HMO | Admitting: Family

## 2023-11-26 ENCOUNTER — Encounter: Payer: Medicare HMO | Admitting: Orthopedic Surgery

## 2023-11-30 DIAGNOSIS — E785 Hyperlipidemia, unspecified: Secondary | ICD-10-CM | POA: Diagnosis not present

## 2023-11-30 DIAGNOSIS — K219 Gastro-esophageal reflux disease without esophagitis: Secondary | ICD-10-CM | POA: Diagnosis not present

## 2023-11-30 DIAGNOSIS — Z1159 Encounter for screening for other viral diseases: Secondary | ICD-10-CM | POA: Diagnosis not present

## 2023-11-30 DIAGNOSIS — G629 Polyneuropathy, unspecified: Secondary | ICD-10-CM | POA: Diagnosis not present

## 2023-11-30 DIAGNOSIS — Z79899 Other long term (current) drug therapy: Secondary | ICD-10-CM | POA: Diagnosis not present

## 2023-11-30 DIAGNOSIS — Z0000 Encounter for general adult medical examination without abnormal findings: Secondary | ICD-10-CM | POA: Diagnosis not present

## 2023-11-30 DIAGNOSIS — J309 Allergic rhinitis, unspecified: Secondary | ICD-10-CM | POA: Diagnosis not present

## 2023-11-30 DIAGNOSIS — Z6821 Body mass index (BMI) 21.0-21.9, adult: Secondary | ICD-10-CM | POA: Diagnosis not present

## 2023-11-30 DIAGNOSIS — Z1321 Encounter for screening for nutritional disorder: Secondary | ICD-10-CM | POA: Diagnosis not present

## 2023-12-08 NOTE — Telephone Encounter (Signed)
 Call to patient, no answer. Left message to return call

## 2023-12-08 NOTE — Telephone Encounter (Signed)
 Pt called wanting to speak to the nurse she spoke to last month regarding her numbness. Pt states it is getting worse. She states her L foot is still numb and feeling heavy. Also her L hand is still numb as well and her pinky bends out of control at times where she has to pry it straight. She states that the gabapentin  (NEURONTIN ) 100 MG capsule is not working for her. Please advise.

## 2023-12-10 ENCOUNTER — Encounter: Admitting: Orthopedic Surgery

## 2023-12-31 ENCOUNTER — Other Ambulatory Visit (HOSPITAL_BASED_OUTPATIENT_CLINIC_OR_DEPARTMENT_OTHER): Payer: Self-pay | Admitting: Registered Nurse

## 2023-12-31 DIAGNOSIS — E2839 Other primary ovarian failure: Secondary | ICD-10-CM

## 2024-01-04 ENCOUNTER — Other Ambulatory Visit: Payer: Self-pay

## 2024-01-04 DIAGNOSIS — F32A Depression, unspecified: Secondary | ICD-10-CM

## 2024-01-04 MED ORDER — HYDROXYZINE PAMOATE 25 MG PO CAPS: 25.0000 mg | ORAL_CAPSULE | Freq: Every day | ORAL | 0 refills | Status: AC | PRN

## 2024-10-27 ENCOUNTER — Other Ambulatory Visit (HOSPITAL_BASED_OUTPATIENT_CLINIC_OR_DEPARTMENT_OTHER)
